# Patient Record
Sex: Female | Born: 1970 | Race: White | Hispanic: No | Marital: Married | Smoking: Current every day smoker
Health system: Southern US, Community
[De-identification: ages and names within clinical notes are randomized; demographics above are authoritative.]

## PROBLEM LIST (undated history)

## (undated) DIAGNOSIS — K635 Polyp of colon: Secondary | ICD-10-CM

## (undated) DIAGNOSIS — N926 Irregular menstruation, unspecified: Secondary | ICD-10-CM

## (undated) DIAGNOSIS — Z8709 Personal history of other diseases of the respiratory system: Secondary | ICD-10-CM

## (undated) DIAGNOSIS — Z87442 Personal history of urinary calculi: Secondary | ICD-10-CM

## (undated) DIAGNOSIS — R7303 Prediabetes: Secondary | ICD-10-CM

## (undated) DIAGNOSIS — R0609 Other forms of dyspnea: Secondary | ICD-10-CM

## (undated) DIAGNOSIS — K589 Irritable bowel syndrome without diarrhea: Secondary | ICD-10-CM

## (undated) DIAGNOSIS — Z915 Personal history of self-harm: Secondary | ICD-10-CM

## (undated) DIAGNOSIS — N200 Calculus of kidney: Secondary | ICD-10-CM

## (undated) DIAGNOSIS — F32A Depression, unspecified: Secondary | ICD-10-CM

## (undated) DIAGNOSIS — F411 Generalized anxiety disorder: Secondary | ICD-10-CM

## (undated) DIAGNOSIS — Z9151 Personal history of suicidal behavior: Secondary | ICD-10-CM

## (undated) DIAGNOSIS — F319 Bipolar disorder, unspecified: Secondary | ICD-10-CM

## (undated) DIAGNOSIS — J309 Allergic rhinitis, unspecified: Secondary | ICD-10-CM

## (undated) DIAGNOSIS — Z87898 Personal history of other specified conditions: Secondary | ICD-10-CM

## (undated) DIAGNOSIS — K222 Esophageal obstruction: Secondary | ICD-10-CM

## (undated) DIAGNOSIS — R0600 Dyspnea, unspecified: Secondary | ICD-10-CM

## (undated) DIAGNOSIS — F329 Major depressive disorder, single episode, unspecified: Secondary | ICD-10-CM

## (undated) HISTORY — DX: Polyp of colon: K63.5

## (undated) HISTORY — PX: COLONOSCOPY: SHX174

## (undated) HISTORY — PX: EXTRACORPOREAL SHOCK WAVE LITHOTRIPSY: SHX1557

---

## 1997-12-29 HISTORY — PX: TUBAL LIGATION: SHX77

## 1998-03-11 ENCOUNTER — Inpatient Hospital Stay (HOSPITAL_COMMUNITY): Admission: AD | Admit: 1998-03-11 | Discharge: 1998-03-11 | Payer: Self-pay | Admitting: *Deleted

## 1998-04-18 ENCOUNTER — Ambulatory Visit (HOSPITAL_COMMUNITY): Admission: RE | Admit: 1998-04-18 | Discharge: 1998-04-18 | Payer: Self-pay | Admitting: *Deleted

## 1998-04-26 ENCOUNTER — Inpatient Hospital Stay (HOSPITAL_COMMUNITY): Admission: AD | Admit: 1998-04-26 | Discharge: 1998-04-26 | Payer: Self-pay | Admitting: Obstetrics & Gynecology

## 1998-04-27 ENCOUNTER — Inpatient Hospital Stay (HOSPITAL_COMMUNITY): Admission: AD | Admit: 1998-04-27 | Discharge: 1998-04-27 | Payer: Self-pay | Admitting: Obstetrics & Gynecology

## 1998-05-10 ENCOUNTER — Ambulatory Visit (HOSPITAL_COMMUNITY): Admission: RE | Admit: 1998-05-10 | Discharge: 1998-05-10 | Payer: Self-pay | Admitting: *Deleted

## 1998-06-25 ENCOUNTER — Inpatient Hospital Stay (HOSPITAL_COMMUNITY): Admission: AD | Admit: 1998-06-25 | Discharge: 1998-06-25 | Payer: Self-pay | Admitting: Obstetrics & Gynecology

## 1998-06-29 ENCOUNTER — Inpatient Hospital Stay (HOSPITAL_COMMUNITY): Admission: AD | Admit: 1998-06-29 | Discharge: 1998-06-29 | Payer: Self-pay | Admitting: Obstetrics & Gynecology

## 1998-07-09 ENCOUNTER — Ambulatory Visit (HOSPITAL_COMMUNITY): Admission: RE | Admit: 1998-07-09 | Discharge: 1998-07-09 | Payer: Self-pay | Admitting: *Deleted

## 1998-08-28 ENCOUNTER — Inpatient Hospital Stay (HOSPITAL_COMMUNITY): Admission: AD | Admit: 1998-08-28 | Discharge: 1998-08-28 | Payer: Self-pay | Admitting: *Deleted

## 1998-08-29 ENCOUNTER — Inpatient Hospital Stay (HOSPITAL_COMMUNITY): Admission: AD | Admit: 1998-08-29 | Discharge: 1998-08-29 | Payer: Self-pay | Admitting: Obstetrics

## 1998-09-20 ENCOUNTER — Inpatient Hospital Stay (HOSPITAL_COMMUNITY): Admission: AD | Admit: 1998-09-20 | Discharge: 1998-09-20 | Payer: Self-pay | Admitting: Obstetrics & Gynecology

## 1998-10-08 ENCOUNTER — Observation Stay (HOSPITAL_COMMUNITY): Admission: AD | Admit: 1998-10-08 | Discharge: 1998-10-09 | Payer: Self-pay | Admitting: Obstetrics

## 1998-10-12 ENCOUNTER — Inpatient Hospital Stay (HOSPITAL_COMMUNITY): Admission: AD | Admit: 1998-10-12 | Discharge: 1998-10-12 | Payer: Self-pay | Admitting: Obstetrics & Gynecology

## 1998-10-18 ENCOUNTER — Inpatient Hospital Stay (HOSPITAL_COMMUNITY): Admission: AD | Admit: 1998-10-18 | Discharge: 1998-10-18 | Payer: Self-pay | Admitting: Obstetrics & Gynecology

## 1998-10-20 ENCOUNTER — Inpatient Hospital Stay (HOSPITAL_COMMUNITY): Admission: AD | Admit: 1998-10-20 | Discharge: 1998-10-23 | Payer: Self-pay | Admitting: Obstetrics & Gynecology

## 1999-03-21 ENCOUNTER — Emergency Department (HOSPITAL_COMMUNITY): Admission: EM | Admit: 1999-03-21 | Discharge: 1999-03-21 | Payer: Self-pay | Admitting: Emergency Medicine

## 2000-09-30 ENCOUNTER — Emergency Department (HOSPITAL_COMMUNITY): Admission: EM | Admit: 2000-09-30 | Discharge: 2000-10-01 | Payer: Self-pay | Admitting: Emergency Medicine

## 2004-08-09 ENCOUNTER — Emergency Department (HOSPITAL_COMMUNITY): Admission: EM | Admit: 2004-08-09 | Discharge: 2004-08-09 | Payer: Self-pay | Admitting: Family Medicine

## 2004-12-25 ENCOUNTER — Emergency Department (HOSPITAL_COMMUNITY): Admission: EM | Admit: 2004-12-25 | Discharge: 2004-12-25 | Payer: Self-pay | Admitting: Emergency Medicine

## 2005-09-18 ENCOUNTER — Ambulatory Visit (HOSPITAL_COMMUNITY): Admission: RE | Admit: 2005-09-18 | Discharge: 2005-09-19 | Payer: Self-pay | Admitting: Urology

## 2005-09-18 HISTORY — PX: PERCUTANEOUS NEPHROSTOLITHOTOMY: SHX2207

## 2005-10-09 ENCOUNTER — Ambulatory Visit (HOSPITAL_COMMUNITY): Admission: RE | Admit: 2005-10-09 | Discharge: 2005-10-09 | Payer: Self-pay | Admitting: Urology

## 2007-07-19 ENCOUNTER — Emergency Department (HOSPITAL_COMMUNITY): Admission: EM | Admit: 2007-07-19 | Discharge: 2007-07-19 | Payer: Self-pay | Admitting: Emergency Medicine

## 2008-03-06 ENCOUNTER — Emergency Department (HOSPITAL_COMMUNITY): Admission: EM | Admit: 2008-03-06 | Discharge: 2008-03-06 | Payer: Self-pay | Admitting: Emergency Medicine

## 2008-04-10 ENCOUNTER — Emergency Department (HOSPITAL_COMMUNITY): Admission: EM | Admit: 2008-04-10 | Discharge: 2008-04-10 | Payer: Self-pay | Admitting: Emergency Medicine

## 2008-06-15 ENCOUNTER — Emergency Department (HOSPITAL_COMMUNITY): Admission: EM | Admit: 2008-06-15 | Discharge: 2008-06-15 | Payer: Self-pay | Admitting: Emergency Medicine

## 2008-07-07 ENCOUNTER — Emergency Department (HOSPITAL_COMMUNITY): Admission: EM | Admit: 2008-07-07 | Discharge: 2008-07-08 | Payer: Self-pay | Admitting: Family Medicine

## 2008-08-28 ENCOUNTER — Encounter: Admission: RE | Admit: 2008-08-28 | Discharge: 2008-11-26 | Payer: Self-pay | Admitting: Chiropractic Medicine

## 2008-08-31 ENCOUNTER — Ambulatory Visit (HOSPITAL_COMMUNITY): Admission: RE | Admit: 2008-08-31 | Discharge: 2008-08-31 | Payer: Self-pay | Admitting: Urology

## 2008-11-22 ENCOUNTER — Encounter: Admission: RE | Admit: 2008-11-22 | Discharge: 2008-11-22 | Payer: Self-pay | Admitting: Sports Medicine

## 2008-12-26 ENCOUNTER — Encounter: Admission: RE | Admit: 2008-12-26 | Discharge: 2008-12-26 | Payer: Self-pay | Admitting: Sports Medicine

## 2011-01-19 ENCOUNTER — Encounter: Payer: Self-pay | Admitting: Nephrology

## 2011-01-19 ENCOUNTER — Encounter: Payer: Self-pay | Admitting: Sports Medicine

## 2011-05-16 NOTE — Op Note (Signed)
NAME:  Stacie Sanders, Stacie Sanders                 ACCOUNT NO.:  [id number]   MEDICAL RECORD NO.:  [id number]          PATIENT TYPE:  AMB   LOCATION:  DAY                          FACILITY:  [facility]   PHYSICIAN:  Mark C. Vernie Ammons, M.D.  DATE OF BIRTH:  [birth year]   DATE OF PROCEDURE:  09/18/2005  DATE OF DISCHARGE:                                 OPERATIVE REPORT   PREOPERATIVE DIAGNOSIS:  Right staghorn calculus.   POSTOPERATIVE DIAGNOSIS:  Right staghorn calculus.   PROCEDURE:  Right percutaneous nephrostolithotomy (5 cm.) with antegrade  pyelogram with interpretation.   SURGEON:  Dr. Vernie Ammons.   ASSISTANT:  Dr. Blanch Media.   RADIOLOGIST:  Dr. Miles Costain.   DRAIN:  A 22-French Council-tip catheter in the renal pelvis as a  nephrostomy tube.   SPECIMEN:  Stone given to patient.   BLOOD LOSS:  Approximately 500 mL.   COMPLICATIONS:  None.   INDICATIONS:  The patient is a 40 year old white female, who had right flank  pain and was evaluated with a CT scan that revealed a large staghorn  calculus involving the lower third of her right kidney.  There was no  obstruction, but it was causing pain.  We discussed the treatment options.  She has elected to proceed with percutaneous nephrostolithotomy,  understanding the risks, complications, and alternatives.   DESCRIPTION OF OPERATION:  After informed consent, the patient brought to  the major OR, placed on the table, administered general anesthesia, then  moved to the prone position.  Her flank was sterilely prepped and draped,  and the nephrostomy catheter that was placed by radiology earlier in the day  was prepped in the field.  Through this, Dr. Miles Costain placed a working  guidewire, a safety guidewire, and then the fascial dilating balloon over  which a 30-French Amplatz sheath was then passed into the area of the renal  pelvis.  I then passed the rigid nephroscope through the Amplatz sheath into  the lower pole calix and identified the stone.  Using  the LithoClast, the  stone was fragmented, and large fragments were removed.  I then was able to  grasp further portions of the stone that extended up superiorly.  The stone  was fragmented with LithoClast, and then the large portions were extracted.  There was a moderate amount of bleeding during the procedure which made it  somewhat difficult to visualize the stone.  There was one portion of the  stone that remained at the end of the procedure, and I could not visualize  it.  In reviewing her CT scan in the operating room, it appeared that this  stone was in a parallel calix.  Because it could not be accessed with the  rigid scope and because of the amount of blood in the renal pelvis  preventing flexible cystoscopy or flexible nephrostomy, it was felt the best  treatment was to place a nephrostomy tube and treat this stone with either  lithotripsy or relook nephroscopy at a later date.  The tube was secured  after an antegrade nephrostogram revealed no significant extravasation and  passage  of contrast down the ureter.  The balloon was filled with  approximately 3 mL of dilute contrast, and the tube was secured to the skin  with silk sutures.  A sterile occlusive dressing was applied using 4x4s and  Tegaderm, and the tube was connected to closed system drainage.  The patient  was awakened and taken to recovery room in stable, satisfactory condition.  She tolerated the procedure well with no intraoperative complications.  She  will be observed overnight with anticipated discharge in the morning.      Mark C. Vernie Ammons, M.D.  Electronically Signed     MCO/MEDQ  D:  09/18/2005  T:  09/19/2005  Job:  [id number]

## 2011-09-22 LAB — DIFFERENTIAL
Basophils Absolute: 0.1
Basophils Relative: 0
Eosinophils Absolute: 0
Eosinophils Relative: 0
Lymphocytes Relative: 12
Lymphs Abs: 1.4
Monocytes Absolute: 0.7
Monocytes Relative: 6
Neutro Abs: 9.4 — ABNORMAL HIGH
Neutrophils Relative %: 81 — ABNORMAL HIGH

## 2011-09-22 LAB — I-STAT 8, (EC8 V) (CONVERTED LAB)
Acid-base deficit: 1
BUN: 5 — ABNORMAL LOW
Bicarbonate: 23.2
Chloride: 100
Glucose, Bld: 105 — ABNORMAL HIGH
HCT: 44
Hemoglobin: 15
Operator id: 151321
Potassium: 3.8
Sodium: 132 — ABNORMAL LOW
TCO2: 24
pCO2, Ven: 37 — ABNORMAL LOW
pH, Ven: 7.406 — ABNORMAL HIGH

## 2011-09-22 LAB — URINE MICROSCOPIC-ADD ON

## 2011-09-22 LAB — URINALYSIS, ROUTINE W REFLEX MICROSCOPIC
Glucose, UA: NEGATIVE
Ketones, ur: >80 — AB
Nitrite: NEGATIVE
Protein, ur: 100 — AB
Specific Gravity, Urine: 1.021
Urobilinogen, UA: 1
pH: 6

## 2011-09-22 LAB — CBC
HCT: 38.9
Hemoglobin: 13.4
MCHC: 34.4
MCV: 90.6
Platelets: 294
RBC: 4.3
RDW: 12.1
WBC: 11.7 — ABNORMAL HIGH

## 2011-09-22 LAB — POCT I-STAT CREATININE
Creatinine, Ser: 1
Operator id: 151321

## 2011-09-25 LAB — BASIC METABOLIC PANEL
BUN: 6
CO2: 22
Calcium: 9.2
Chloride: 102
Creatinine, Ser: 0.8
GFR calc Af Amer: >60
GFR calc non Af Amer: >60
Glucose, Bld: 135 — ABNORMAL HIGH
Potassium: 3.6
Sodium: 134 — ABNORMAL LOW

## 2011-09-25 LAB — DIFFERENTIAL
Basophils Absolute: 0
Basophils Absolute: 0.1
Basophils Relative: 0
Eosinophils Absolute: 0
Eosinophils Relative: 0
Eosinophils Relative: 1
Lymphocytes Relative: 7 — ABNORMAL LOW
Lymphocytes Relative: 18
Lymphs Abs: 1.3
Monocytes Absolute: 0.8
Monocytes Relative: 4
Neutro Abs: 18.3 — ABNORMAL HIGH
Neutrophils Relative %: 90 — ABNORMAL HIGH

## 2011-09-25 LAB — CBC
HCT: 42.4
Hemoglobin: 14.6
MCHC: 34.3
MCHC: 34.3
MCV: 90.4
Platelets: 304
Platelets: 357
RBC: 4.69
RDW: 12.8
RDW: 12.7
WBC: 20.4 — ABNORMAL HIGH

## 2011-09-25 LAB — URINE MICROSCOPIC-ADD ON

## 2011-09-25 LAB — URINALYSIS, ROUTINE W REFLEX MICROSCOPIC
Bilirubin Urine: NEGATIVE
Glucose, UA: NEGATIVE
Hgb urine dipstick: NEGATIVE
Ketones, ur: NEGATIVE
Leukocytes, UA: NEGATIVE
Nitrite: NEGATIVE
Protein, ur: 30 — AB
Specific Gravity, Urine: 1.018
Urobilinogen, UA: 0.2
pH: 6.5

## 2011-09-25 LAB — COMPREHENSIVE METABOLIC PANEL
ALT: 27
Albumin: 3.7
Alkaline Phosphatase: 103
Calcium: 9.2
GFR calc Af Amer: >60
GFR calc non Af Amer: >60
Potassium: 3.3 — ABNORMAL LOW
Sodium: 138
Total Protein: 7

## 2011-09-25 LAB — RAPID STREP SCREEN (MED CTR MEBANE ONLY): Streptococcus, Group A Screen (Direct): POSITIVE — AB

## 2011-09-25 LAB — LIPASE, BLOOD: Lipase: 17

## 2013-08-16 ENCOUNTER — Emergency Department (HOSPITAL_COMMUNITY)
Admission: EM | Admit: 2013-08-16 | Discharge: 2013-08-16 | Disposition: A | Discharge status: Home / Self Care | Payer: Self-pay | Attending: Emergency Medicine | Admitting: Emergency Medicine

## 2013-08-16 ENCOUNTER — Encounter (HOSPITAL_COMMUNITY): Payer: Self-pay | Admitting: *Deleted

## 2013-08-16 ENCOUNTER — Emergency Department (HOSPITAL_COMMUNITY): Payer: Self-pay

## 2013-08-16 DIAGNOSIS — Z8659 Personal history of other mental and behavioral disorders: Secondary | ICD-10-CM | POA: Insufficient documentation

## 2013-08-16 DIAGNOSIS — M545 Low back pain, unspecified: Secondary | ICD-10-CM

## 2013-08-16 DIAGNOSIS — IMO0002 Reserved for concepts with insufficient information to code with codable children: Secondary | ICD-10-CM | POA: Insufficient documentation

## 2013-08-16 DIAGNOSIS — Y9289 Other specified places as the place of occurrence of the external cause: Secondary | ICD-10-CM | POA: Insufficient documentation

## 2013-08-16 DIAGNOSIS — X500XXA Overexertion from strenuous movement or load, initial encounter: Secondary | ICD-10-CM | POA: Insufficient documentation

## 2013-08-16 DIAGNOSIS — F17200 Nicotine dependence, unspecified, uncomplicated: Secondary | ICD-10-CM | POA: Insufficient documentation

## 2013-08-16 DIAGNOSIS — Z87442 Personal history of urinary calculi: Secondary | ICD-10-CM | POA: Insufficient documentation

## 2013-08-16 DIAGNOSIS — Y93E9 Activity, other interior property and clothing maintenance: Secondary | ICD-10-CM | POA: Insufficient documentation

## 2013-08-16 HISTORY — DX: Depression, unspecified: F32.A

## 2013-08-16 HISTORY — DX: Major depressive disorder, single episode, unspecified: F32.9

## 2013-08-16 MED ORDER — NAPROXEN 500 MG PO TABS: 500.0000 mg | ORAL_TABLET | Freq: Two times a day (BID) | ORAL | Status: DC

## 2013-08-16 MED ORDER — METHOCARBAMOL 500 MG PO TABS: 500.0000 mg | ORAL_TABLET | Freq: Two times a day (BID) | ORAL | Status: DC

## 2013-08-16 MED ORDER — HYDROCODONE-ACETAMINOPHEN 5-325 MG PO TABS: 1.0000 | ORAL_TABLET | Freq: Four times a day (QID) | ORAL | Status: DC | PRN

## 2013-08-16 NOTE — ED Notes (Signed)
Pt states Saturday felt back pop and since has been having pain from mid back down to hips.  No strenuous activity.  Pt feels like she has spasms

## 2013-08-16 NOTE — ED Provider Notes (Signed)
CSN: [id number]     Arrival date & time 08/16/13  9147 History     First MD Initiated Contact with Patient 08/16/13 0957     Chief Complaint  Patient presents with  . Back Pain   (Consider location/radiation/quality/duration/timing/severity/associated sxs/prior Treatment) HPI Comments: Patient presents with a chief complaint of lower back pain.  She reports that she when she was bending over and cleaning three days ago she felt a "pop" in her lower back.  She has been having constant pain since that time.  She has taken Ibuprofen for the pain without relief.  She states that the pain is worse on the right side of her lower back.  Pain radiates to her right buttock.  She denies numbness, tingling, bowel or bladder incontinence, fever or chills.    Patient is a 42 y.o. female presenting with back pain. The history is provided by the patient.  Back Pain Worsened by:  Bending and ambulation Associated symptoms: no abdominal pain, no bladder incontinence, no bowel incontinence, no dysuria, no fever, no numbness, no paresthesias, no tingling and no weakness     Past Medical History  Diagnosis Date  . Depression   . Kidney stone    Past Surgical History  Procedure Laterality Date  . Tubal ligation    . Lithotripsy     No family history on file. History  Substance Use Topics  . Smoking status: Current Every Day Smoker  . Smokeless tobacco: Not on file  . Alcohol Use: No   OB History   Grav Para Term Preterm Abortions TAB SAB Ect Mult Living                 Review of Systems  Constitutional: Negative for fever.  Gastrointestinal: Negative for abdominal pain and bowel incontinence.  Genitourinary: Negative for bladder incontinence and dysuria.  Musculoskeletal: Positive for back pain.  Neurological: Negative for tingling, weakness, numbness and paresthesias.  All other systems reviewed and are negative.    Allergies  Review of patient's allergies indicates no known  allergies.  Home Medications   Current Outpatient Rx  Name  Route  Sig  Dispense  Refill  . ibuprofen (ADVIL,MOTRIN) 200 MG tablet   Oral   Take 600 mg by mouth every 6 (six) hours as needed for pain.          BP 116/70  Pulse 80  Temp(Src) 98.5 F (36.9 C) (Oral)  Resp 18  SpO2 98%  LMP 07/07/2013 Physical Exam  Nursing note and vitals reviewed. Constitutional: She appears well-developed and well-nourished.  HENT:  Head: Normocephalic and atraumatic.  Cardiovascular: Normal rate, regular rhythm and normal heart sounds.   Pulmonary/Chest: Effort normal and breath sounds normal.  Musculoskeletal:  Pain improved with flexion of the back.  Pain worsens with extension of the back.  Neurological: She is alert. She has normal strength. No sensory deficit. Gait normal.  Reflex Scores:      Patellar reflexes are 2+ on the right side and 2+ on the left side.      Achilles reflexes are 2+ on the right side and 2+ on the left side. Skin: Skin is warm and dry.  Psychiatric: She has a normal mood and affect.    ED Course   Procedures (including critical care time)  Labs Reviewed - No data to display No results found. No diagnosis found.  MDM  Patient with back pain.  No neurological deficits and normal neuro exam.  Patient can walk  but states is painful.  No loss of bowel or bladder control.  No concern for cauda equina.  No fever, night sweats, weight loss, h/o cancer, IVDU.  RICE protocol and pain medicine indicated and discussed with patient.   Pascal Lux Green, PA-C 08/16/13 [phone]

## 2013-08-16 NOTE — ED Provider Notes (Signed)
Medical screening examination/treatment/procedure(s) were performed by non-physician practitioner and as supervising physician I was immediately available for consultation/collaboration.  Clovis Warwick L Keyshun Elpers, MD 08/16/13 1633 

## 2014-08-28 ENCOUNTER — Encounter (HOSPITAL_COMMUNITY): Payer: Self-pay | Admitting: Emergency Medicine

## 2014-08-28 ENCOUNTER — Emergency Department (INDEPENDENT_AMBULATORY_CARE_PROVIDER_SITE_OTHER)
Admission: EM | Admit: 2014-08-28 | Discharge: 2014-08-28 | Disposition: A | Discharge status: Home / Self Care | Payer: Self-pay | Source: Home / Self Care

## 2014-08-28 DIAGNOSIS — J4520 Mild intermittent asthma, uncomplicated: Secondary | ICD-10-CM

## 2014-08-28 DIAGNOSIS — Z720 Tobacco use: Secondary | ICD-10-CM

## 2014-08-28 DIAGNOSIS — J45909 Unspecified asthma, uncomplicated: Secondary | ICD-10-CM

## 2014-08-28 DIAGNOSIS — J3089 Other allergic rhinitis: Secondary | ICD-10-CM

## 2014-08-28 DIAGNOSIS — F17200 Nicotine dependence, unspecified, uncomplicated: Secondary | ICD-10-CM

## 2014-08-28 MED ORDER — METHYLPREDNISOLONE 4 MG PO KIT: PACK | ORAL | Status: DC

## 2014-08-28 MED ORDER — ALBUTEROL SULFATE (2.5 MG/3ML) 0.083% IN NEBU
2.5000 mg | INHALATION_SOLUTION | Freq: Once | RESPIRATORY_TRACT | Status: AC
  Administered 2014-08-28: 2.5 mg via RESPIRATORY_TRACT

## 2014-08-28 MED ORDER — IPRATROPIUM-ALBUTEROL 0.5-2.5 (3) MG/3ML IN SOLN
3.0000 mL | Freq: Once | RESPIRATORY_TRACT | Status: AC
  Administered 2014-08-28: 3 mL via RESPIRATORY_TRACT

## 2014-08-28 MED ORDER — ALBUTEROL SULFATE HFA 108 (90 BASE) MCG/ACT IN AERS: 2.0000 | INHALATION_SPRAY | RESPIRATORY_TRACT | Status: DC | PRN

## 2014-08-28 MED ORDER — ALBUTEROL SULFATE (2.5 MG/3ML) 0.083% IN NEBU
INHALATION_SOLUTION | RESPIRATORY_TRACT | Status: AC
  Filled 2014-08-28: qty 6

## 2014-08-28 MED ORDER — IPRATROPIUM-ALBUTEROL 0.5-2.5 (3) MG/3ML IN SOLN
RESPIRATORY_TRACT | Status: AC
Start: 2014-08-28 — End: 2014-08-28
  Filled 2014-08-28: qty 3

## 2014-08-28 NOTE — ED Notes (Signed)
Patient c/o cough onset 2 days ago. Patient reports she feels the most pain after she coughs. Patient also has SOB after coughing. Patient is alert and oriented and in no acute distress.

## 2014-08-28 NOTE — ED Provider Notes (Signed)
CSN: [id number]     Arrival date & time 08/28/14  1214 History   First MD Initiated Contact with Patient 08/28/14 1304     Chief Complaint  Patient presents with  . Cough   (Consider location/radiation/quality/duration/timing/severity/associated sxs/prior Treatment) HPI Comments: 43 year old obese female smoker and is complaining of cough for 2 days. She is also having a chest pain associated with the cough. She complains of shortness of breath when she coughs. Denies PND or fever   Past Medical History  Diagnosis Date  . Depression   . Kidney stone    Past Surgical History  Procedure Laterality Date  . Tubal ligation    . Lithotripsy     No family history on file. History  Substance Use Topics  . Smoking status: Current Every Day Smoker  . Smokeless tobacco: Not on file  . Alcohol Use: No   OB History   Grav Para Term Preterm Abortions TAB SAB Ect Mult Living                 Review of Systems  Constitutional: Negative for fever, chills, activity change, appetite change and fatigue.  HENT: Positive for congestion and rhinorrhea. Negative for facial swelling, postnasal drip and sore throat.   Eyes: Negative.   Respiratory: Positive for cough and shortness of breath.   Cardiovascular: Negative.   Musculoskeletal: Negative for neck pain and neck stiffness.  Skin: Negative for pallor and rash.  Neurological: Negative.     Allergies  Review of patient's allergies indicates no known allergies.  Home Medications   Prior to Admission medications   Medication Sig Start Date End Date Taking? Authorizing Provider  albuterol (PROVENTIL HFA;VENTOLIN HFA) 108 (90 BASE) MCG/ACT inhaler Inhale 2 puffs into the lungs every 4 (four) hours as needed for wheezing or shortness of breath. 08/28/14   Hayden Rasmussen, NP  HYDROcodone-acetaminophen (NORCO/VICODIN) 5-325 MG per tablet Take 1-2 tablets by mouth every 6 (six) hours as needed for pain. 08/16/13   Heather Laisure, PA-C  ibuprofen  (ADVIL,MOTRIN) 200 MG tablet Take 600 mg by mouth every 6 (six) hours as needed for pain.    Historical Provider, MD  methocarbamol (ROBAXIN) 500 MG tablet Take 1 tablet (500 mg total) by mouth 2 (two) times daily. 08/16/13   Heather Laisure, PA-C  methylPREDNISolone (MEDROL DOSEPAK) 4 MG tablet As directed. Take with  food 08/28/14   Hayden Rasmussen, NP  naproxen (NAPROSYN) 500 MG tablet Take 1 tablet (500 mg total) by mouth 2 (two) times daily. 08/16/13   Heather Laisure, PA-C   BP 122/89  Pulse 88  Temp(Src) 98.2 F (36.8 C) (Oral)  Resp 18  SpO2 100%  LMP 08/13/2014 Physical Exam  Nursing note and vitals reviewed. Constitutional: She is oriented to person, place, and time. She appears well-developed and well-nourished. No distress.  HENT:  Mouth/Throat: No oropharyngeal exudate.  OP with minor injection and clear PND. Mucous membranes moist.  Eyes: Conjunctivae and EOM are normal.  Neck: Normal range of motion. Neck supple.  Cardiovascular: Normal rate, regular rhythm and normal heart sounds.   Pulmonary/Chest: Effort normal. No respiratory distress. She has wheezes.  Expiration reveals diffuse bilateral coarseness and wheezing.  Musculoskeletal: Normal range of motion. She exhibits no edema.  Lymphadenopathy:    She has no cervical adenopathy.  Neurological: She is alert and oriented to person, place, and time.  Skin: Skin is warm and dry. No rash noted.  Psychiatric: She has a normal mood and affect.  ED Course  Procedures (including critical care time) Labs Review Labs Reviewed - No data to display  Imaging Review No results found.   MDM   1. RAD (reactive airway disease) with wheezing, mild intermittent, uncomplicated   2. Other allergic rhinitis   3. Tobacco abuse disorder    Post duoneb improved air movement, still with some coarseness with cough. When chk'd while using the neb each time she did not have the mouthpiece in her mouth. Query compliant. Albuterol  HFA claritin or allegra for drainage Medrol dose pack Stop Smoking    Hayden Rasmussen, NP 08/28/14 1420

## 2014-08-28 NOTE — ED Provider Notes (Signed)
Medical screening examination/treatment/procedure(s) were performed by non-physician practitioner and as supervising physician I was immediately available for consultation/collaboration.  Leslee Home, M.D.  Reuben Likes, MD 08/28/14 [phone]

## 2014-08-28 NOTE — Discharge Instructions (Signed)
Bronchospasm °A bronchospasm is a spasm or tightening of the airways going into the lungs. During a bronchospasm breathing becomes more difficult because the airways get smaller. When this happens there can be coughing, a whistling sound when breathing (wheezing), and difficulty breathing. Bronchospasm is often associated with asthma, but not all patients who experience a bronchospasm have asthma. °CAUSES  °A bronchospasm is caused by inflammation or irritation of the airways. The inflammation or irritation may be triggered by:  °· Allergies (such as to animals, pollen, food, or mold). Allergens that cause bronchospasm may cause wheezing immediately after exposure or many hours later.   °· Infection. Viral infections are believed to be the most common cause of bronchospasm.   °· Exercise.   °· Irritants (such as pollution, cigarette smoke, strong odors, aerosol sprays, and paint fumes).   °· Weather changes. Winds increase molds and pollens in the air. Rain refreshes the air by washing irritants out. Cold air may cause inflammation.   °· Stress and emotional upset.   °SIGNS AND SYMPTOMS  °· Wheezing.   °· Excessive nighttime coughing.   °· Frequent or severe coughing with a simple cold.   °· Chest tightness.   °· Shortness of breath.   °DIAGNOSIS  °Bronchospasm is usually diagnosed through a history and physical exam. Tests, such as chest X-rays, are sometimes done to look for other conditions. °TREATMENT  °· Inhaled medicines can be given to open up your airways and help you breathe. The medicines can be given using either an inhaler or a nebulizer machine. °· Corticosteroid medicines may be given for severe bronchospasm, usually when it is associated with asthma. °HOME CARE INSTRUCTIONS  °· Always have a plan prepared for seeking medical care. Know when to call your health care provider and local emergency services (911 in the [place] Know where you can access local emergency care. °· Only take medicines as  directed by your health care provider. °· If you were prescribed an inhaler or nebulizer machine, ask your health care provider to explain how to use it correctly. Always use a spacer with your inhaler if you were given one. °· It is necessary to remain calm during an attack. Try to relax and breathe more slowly.  °· Control your home environment in the following ways:   °¨ Change your heating and air conditioning filter at least once a month.   °¨ Limit your use of fireplaces and wood stoves. °¨ Do not smoke and do not allow smoking in your home.   °¨ Avoid exposure to perfumes and fragrances.   °¨ Get rid of pests (such as roaches and mice) and their droppings.   °¨ Throw away plants if you see mold on them.   °¨ Keep your house clean and dust free.   °¨ Replace carpet with wood, tile, or vinyl flooring. Carpet can trap dander and dust.   °¨ Use allergy-proof pillows, mattress covers, and box spring covers.   °¨ Wash bed sheets and blankets every week in hot water and dry them in a dryer.   °¨ Use blankets that are made of polyester or cotton.   °¨ Wash hands frequently. °SEEK MEDICAL CARE IF:  °· You have muscle aches.   °· You have chest pain.   °· The sputum changes from clear or white to yellow, green, gray, or bloody.   °· The sputum you cough up gets thicker.   °· There are problems that may be related to the medicine you are given, such as a rash, itching, swelling, or trouble breathing.   °SEEK IMMEDIATE MEDICAL CARE IF:  °· You have worsening wheezing and coughing even   after taking your prescribed medicines.   You have increased difficulty breathing.   You develop severe chest pain. MAKE SURE YOU:   Understand these instructions.  Will watch your condition.  Will get help right away if you are not doing well or get worse. Document Released: 12/18/2003 Document Revised: 12/20/2013 Document Reviewed: 06/06/2013 [facility] Patient Information 2015 [place], [place]. This information is not  intended to replace advice given to you by your health care provider. Make sure you discuss any questions you have with your health care provider.  Cough, Adult  A cough is a reflex. It helps you clear your throat and airways. A cough can help heal your body. A cough can last 2 or 3 weeks (acute) or may last more than 8 weeks (chronic). Some common causes of a cough can include an infection, allergy, or a cold. HOME CARE  Only take medicine as told by your doctor.  If given, take your medicines (antibiotics) as told. Finish them even if you start to feel better.  Use a cold steam vaporizer or humidifier in your home. This can help loosen thick spit (secretions).  Sleep so you are almost sitting up (semi-upright). Use pillows to do this. This helps reduce coughing.  Rest as needed.  Stop smoking if you smoke. GET HELP RIGHT AWAY IF:  You have yellowish-white fluid (pus) in your thick spit.  Your cough gets worse.  Your medicine does not reduce coughing, and you are losing sleep.  You cough up blood.  You have trouble breathing.  Your pain gets worse and medicine does not help.  You have a fever. MAKE SURE YOU:   Understand these instructions.  Will watch your condition.  Will get help right away if you are not doing well or get worse. Document Released: 08/28/2011 Document Revised: 05/01/2014 Document Reviewed: 08/28/2011 [facility] Patient Information 2015 [place], [place]. This information is not intended to replace advice given to you by your health care provider. Make sure you discuss any questions you have with your health care provider.  How to Use an Inhaler Using your inhaler correctly is very important. Good technique will make sure that the medicine reaches your lungs.  HOW TO USE AN INHALER: 1. Take the cap off the inhaler. 2. If this is the first time using your inhaler, you need to prime it. Shake the inhaler for 5 seconds. Release four puffs into the air, away  from your face. Ask your doctor for help if you have questions. 3. Shake the inhaler for 5 seconds. 4. Turn the inhaler so the bottle is above the mouthpiece. 5. Put your pointer finger on top of the bottle. Your thumb holds the bottom of the inhaler. 6. Open your mouth. 7. Either hold the inhaler away from your mouth (the width of 2 fingers) or place your lips tightly around the mouthpiece. Ask your doctor which way to use your inhaler. 8. Breathe out as much air as possible. 9. Breathe in and push down on the bottle 1 time to release the medicine. You will feel the medicine go in your mouth and throat. 10. Continue to take a deep breath in very slowly. Try to fill your lungs. 11. After you have breathed in completely, hold your breath for 10 seconds. This will help the medicine to settle in your lungs. If you cannot hold your breath for 10 seconds, hold it for as long as you can before you breathe out. 12. Breathe out slowly, through pursed lips.  Whistling is an example of pursed lips. 13. If your doctor has told you to take more than 1 puff, wait at least 15-30 seconds between puffs. This will help you get the best results from your medicine. Do not use the inhaler more than your doctor tells you to. 14. Put the cap back on the inhaler. 15. Follow the directions from your doctor or from the inhaler package about cleaning the inhaler. If you use more than one inhaler, ask your doctor which inhalers to use and what order to use them in. Ask your doctor to help you figure out when you will need to refill your inhaler.  If you use a steroid inhaler, always rinse your mouth with water after your last puff, gargle and spit out the water. Do not swallow the water. GET HELP IF:  The inhaler medicine only partially helps to stop wheezing or shortness of breath.  You are having trouble using your inhaler.  You have some increase in thick spit (phlegm). GET HELP RIGHT AWAY IF:  The inhaler medicine  does not help your wheezing or shortness of breath or you have tightness in your chest.  You have dizziness, headaches, or fast heart rate.  You have chills, fever, or night sweats.  You have a large increase of thick spit, or your thick spit is bloody. MAKE SURE YOU:   Understand these instructions.  Will watch your condition.  Will get help right away if you are not doing well or get worse. Document Released: 09/23/2008 Document Revised: 10/05/2013 Document Reviewed: 07/14/2013 [facility] Patient Information 2015 [place], [place]. This information is not intended to replace advice given to you by your health care provider. Make sure you discuss any questions you have with your health care provider.  Smoking Cessation Quitting smoking is important to your health and has many advantages. However, it is not always easy to quit since nicotine is a very addictive drug. Oftentimes, people try 3 times or more before being able to quit. This document explains the best ways for you to prepare to quit smoking. Quitting takes hard work and a lot of effort, but you can do it. ADVANTAGES OF QUITTING SMOKING  You will live longer, feel better, and live better.  Your body will feel the impact of quitting smoking almost immediately.  Within 20 minutes, blood pressure decreases. Your pulse returns to its normal level.  After 8 hours, carbon monoxide levels in the blood return to normal. Your oxygen level increases.  After 24 hours, the chance of having a heart attack starts to decrease. Your breath, hair, and body stop smelling like smoke.  After 48 hours, damaged nerve endings begin to recover. Your sense of taste and smell improve.  After 72 hours, the body is virtually free of nicotine. Your bronchial tubes relax and breathing becomes easier.  After 2 to 12 weeks, lungs can hold more air. Exercise becomes easier and circulation improves.  The risk of having a heart attack, stroke, cancer, or  lung disease is greatly reduced.  After 1 year, the risk of coronary heart disease is cut in half.  After 5 years, the risk of stroke falls to the same as a nonsmoker.  After 10 years, the risk of lung cancer is cut in half and the risk of other cancers decreases significantly.  After 15 years, the risk of coronary heart disease drops, usually to the level of a nonsmoker.  If you are pregnant, quitting smoking will improve your chances  of having a healthy baby.  The people you live with, especially any children, will be healthier.  You will have extra money to spend on things other than cigarettes. QUESTIONS TO THINK ABOUT BEFORE ATTEMPTING TO QUIT You may want to talk about your answers with your health care provider.  Why do you want to quit?  If you tried to quit in the past, what helped and what did not?  What will be the most difficult situations for you after you quit? How will you plan to handle them?  Who can help you through the tough times? Your family? Friends? A health care provider?  What pleasures do you get from smoking? What ways can you still get pleasure if you quit? Here are some questions to ask your health care provider:  How can you help me to be successful at quitting?  What medicine do you think would be best for me and how should I take it?  What should I do if I need more help?  What is smoking withdrawal like? How can I get information on withdrawal? GET READY  Set a quit date.  Change your environment by getting rid of all cigarettes, ashtrays, matches, and lighters in your home, car, or work. Do not let people smoke in your home.  Review your past attempts to quit. Think about what worked and what did not. GET SUPPORT AND ENCOURAGEMENT You have a better chance of being successful if you have help. You can get support in many ways.  Tell your family, friends, and coworkers that you are going to quit and need their support. Ask them not to  smoke around you.  Get individual, group, or telephone counseling and support. Programs are available at [employer] and health centers. Call your local health department for information about programs in your area.  Spiritual beliefs and practices may help some smokers quit.  Download a "quit meter" on your computer to keep track of quit statistics, such as how long you have gone without smoking, cigarettes not smoked, and money saved.  Get a self-help book about quitting smoking and staying off tobacco. LEARN NEW SKILLS AND BEHAVIORS  Distract yourself from urges to smoke. Talk to someone, go for a walk, or occupy your time with a task.  Change your normal routine. Take a different route to work. Drink tea instead of coffee. Eat breakfast in a different place.  Reduce your stress. Take a hot bath, exercise, or read a book.  Plan something enjoyable to do every day. Reward yourself for not smoking.  Explore interactive web-based programs that specialize in helping you quit. GET MEDICINE AND USE IT CORRECTLY Medicines can help you stop smoking and decrease the urge to smoke. Combining medicine with the above behavioral methods and support can greatly increase your chances of successfully quitting smoking.  Nicotine replacement therapy helps deliver nicotine to your body without the negative effects and risks of smoking. Nicotine replacement therapy includes nicotine gum, lozenges, inhalers, nasal sprays, and skin patches. Some may be available over-the-counter and others require a prescription.  Antidepressant medicine helps people abstain from smoking, but how this works is unknown. This medicine is available by prescription.  Nicotinic receptor partial agonist medicine simulates the effect of nicotine in your brain. This medicine is available by prescription. Ask your health care provider for advice about which medicines to use and how to use them based on your health history. Your  health care provider will tell you what  side effects to look out for if you choose to be on a medicine or therapy. Carefully read the information on the package. Do not use any other product containing nicotine while using a nicotine replacement product.  RELAPSE OR DIFFICULT SITUATIONS Most relapses occur within the first 3 months after quitting. Do not be discouraged if you start smoking again. Remember, most people try several times before finally quitting. You may have symptoms of withdrawal because your body is used to nicotine. You may crave cigarettes, be irritable, feel very hungry, cough often, get headaches, or have difficulty concentrating. The withdrawal symptoms are only temporary. They are strongest when you first quit, but they will go away within 10-14 days. To reduce the chances of relapse, try to:  Avoid drinking alcohol. Drinking lowers your chances of successfully quitting.  Reduce the amount of caffeine you consume. Once you quit smoking, the amount of caffeine in your body increases and can give you symptoms, such as a rapid heartbeat, sweating, and anxiety.  Avoid smokers because they can make you want to smoke.  Do not let weight gain distract you. Many smokers will gain weight when they quit, usually less than 10 pounds. Eat a healthy diet and stay active. You can always lose the weight gained after you quit.  Find ways to improve your mood other than smoking. FOR MORE INFORMATION  www.smokefree.gov  Document Released: 12/09/2001 Document Revised: 05/01/2014 Document Reviewed: 03/25/2012 [facility] Patient Information 2015 [place], [place]. This information is not intended to replace advice given to you by your health care provider. Make sure you discuss any questions you have with your health care provider.  Allergic Rhinitis Allegra or Claritin Robitussin Dm  Allergic rhinitis is when the mucous membranes in the nose respond to allergens. Allergens are particles in the  air that cause your body to have an allergic reaction. This causes you to release allergic antibodies. Through a chain of events, these eventually cause you to release histamine into the blood stream. Although meant to protect the body, it is this release of histamine that causes your discomfort, such as frequent sneezing, congestion, and an itchy, runny nose.  CAUSES  Seasonal allergic rhinitis (hay fever) is caused by pollen allergens that may come from grasses, trees, and weeds. Year-round allergic rhinitis (perennial allergic rhinitis) is caused by allergens such as house dust mites, pet dander, and mold spores.  SYMPTOMS   Nasal stuffiness (congestion).  Itchy, runny nose with sneezing and tearing of the eyes. DIAGNOSIS  Your health care provider can help you determine the allergen or allergens that trigger your symptoms. If you and your health care provider are unable to determine the allergen, skin or blood testing may be used. TREATMENT  Allergic rhinitis does not have a cure, but it can be controlled by:  Medicines and allergy shots (immunotherapy).  Avoiding the allergen. Hay fever may often be treated with antihistamines in pill or nasal spray forms. Antihistamines block the effects of histamine. There are over-the-counter medicines that may help with nasal congestion and swelling around the eyes. Check with your health care provider before taking or giving this medicine.  If avoiding the allergen or the medicine prescribed do not work, there are many new medicines your health care provider can prescribe. Stronger medicine may be used if initial measures are ineffective. Desensitizing injections can be used if medicine and avoidance does not work. Desensitization is when a patient is given ongoing shots until the body becomes less sensitive to the  allergen. Make sure you follow up with your health care provider if problems continue. HOME CARE INSTRUCTIONS It is not possible to  completely avoid allergens, but you can reduce your symptoms by taking steps to limit your exposure to them. It helps to know exactly what you are allergic to so that you can avoid your specific triggers. SEEK MEDICAL CARE IF:   You have a fever.  You develop a cough that does not stop easily (persistent).  You have shortness of breath.  You start wheezing.  Symptoms interfere with normal daily activities. Document Released: 09/09/2001 Document Revised: 12/20/2013 Document Reviewed: 08/22/2013 [facility] Patient Information 2015 [place], [place]. This information is not intended to replace advice given to you by your health care provider. Make sure you discuss any questions you have with your health care provider.

## 2015-04-01 ENCOUNTER — Emergency Department (INDEPENDENT_AMBULATORY_CARE_PROVIDER_SITE_OTHER)
Admission: EM | Admit: 2015-04-01 | Discharge: 2015-04-01 | Disposition: A | Discharge status: Home / Self Care | Payer: Self-pay | Source: Home / Self Care | Attending: Emergency Medicine | Admitting: Emergency Medicine

## 2015-04-01 ENCOUNTER — Encounter (HOSPITAL_COMMUNITY): Payer: Self-pay | Admitting: Emergency Medicine

## 2015-04-01 DIAGNOSIS — J029 Acute pharyngitis, unspecified: Secondary | ICD-10-CM

## 2015-04-01 LAB — POCT RAPID STREP A: Streptococcus, Group A Screen (Direct): NEGATIVE

## 2015-04-01 MED ORDER — AMOXICILLIN 500 MG PO CAPS: 500.0000 mg | ORAL_CAPSULE | Freq: Two times a day (BID) | ORAL | Status: DC

## 2015-04-01 NOTE — ED Notes (Signed)
C/o strep throat for a couple of days now States ears and body is achy No treatments tried

## 2015-04-01 NOTE — ED Provider Notes (Signed)
CSN: [id number]     Arrival date & time 04/01/15  1322 History   First MD Initiated Contact with Patient 04/01/15 1357     Chief Complaint  Patient presents with  . Sore Throat   (Consider location/radiation/quality/duration/timing/severity/associated sxs/prior Treatment) HPI  She is a 44 year old woman here for evaluation of sore throat. She states this started about 3 days ago and has gradually been getting worse. It is worse with swallowing. She reports some mild rhinorrhea and sneezing, which she attributes to her allergies. She states she has developed body aches and some bilateral ear pain. She denies any fevers or chills. No nausea or vomiting. She is tolerating liquids well. She has not tried any medications at home.  Past Medical History  Diagnosis Date  . Depression   . Kidney stone    Past Surgical History  Procedure Laterality Date  . Tubal ligation    . Lithotripsy     History reviewed. No pertinent family history. History  Substance Use Topics  . Smoking status: Current Every Day Smoker  . Smokeless tobacco: Not on file  . Alcohol Use: No   OB History    No data available     Review of Systems  Constitutional: Negative for fever and chills.  HENT: Positive for ear pain, rhinorrhea, sneezing and sore throat. Negative for congestion.   Respiratory: Negative for cough and shortness of breath.   Gastrointestinal: Negative for nausea and vomiting.  Musculoskeletal: Positive for myalgias.    Allergies  Review of patient's allergies indicates no known allergies.  Home Medications   Prior to Admission medications   Medication Sig Start Date End Date Taking? Authorizing Provider  albuterol (PROVENTIL HFA;VENTOLIN HFA) 108 (90 BASE) MCG/ACT inhaler Inhale 2 puffs into the lungs every 4 (four) hours as needed for wheezing or shortness of breath. 08/28/14   Hayden Rasmussen, NP  amoxicillin (AMOXIL) 500 MG capsule Take 1 capsule (500 mg total) by mouth 2 (two) times daily.  04/01/15   Charm Rings, MD  HYDROcodone-acetaminophen (NORCO/VICODIN) 5-325 MG per tablet Take 1-2 tablets by mouth every 6 (six) hours as needed for pain. 08/16/13   Heather Laisure, PA-C  ibuprofen (ADVIL,MOTRIN) 200 MG tablet Take 600 mg by mouth every 6 (six) hours as needed for pain.    Historical Provider, MD  methocarbamol (ROBAXIN) 500 MG tablet Take 1 tablet (500 mg total) by mouth 2 (two) times daily. 08/16/13   Heather Laisure, PA-C  methylPREDNISolone (MEDROL DOSEPAK) 4 MG tablet As directed. Take with  food 08/28/14   Hayden Rasmussen, NP  naproxen (NAPROSYN) 500 MG tablet Take 1 tablet (500 mg total) by mouth 2 (two) times daily. 08/16/13   Heather Laisure, PA-C   BP 139/87 mmHg  Pulse 94  Temp(Src) 98 F (36.7 C) (Oral)  Resp 20  SpO2 100%  LMP 03/25/2015 (Exact Date) Physical Exam  Constitutional: She is oriented to person, place, and time. She appears well-developed and well-nourished. No distress.  HENT:  Head: Normocephalic and atraumatic.  Right Ear: Tympanic membrane normal.  Left Ear: Tympanic membrane normal.  Nose: Rhinorrhea present. No mucosal edema.  Mouth/Throat: Posterior oropharyngeal erythema present. No oropharyngeal exudate.  Neck: Neck supple.  Cardiovascular: Normal rate, regular rhythm and normal heart sounds.   No murmur heard. Pulmonary/Chest: Effort normal and breath sounds normal. No respiratory distress. She has no wheezes. She has no rales.  Lymphadenopathy:    She has no cervical adenopathy.  Neurological: She is alert and oriented to  person, place, and time.    ED Course  Procedures (including critical care time) Labs Review Labs Reviewed  POCT RAPID STREP A ([facility] URG CARE ONLY)    Imaging Review No results found.   MDM   1. Pharyngitis    Rapid strep negative, throat culture sent. I'm going to treat for bacterial pharyngitis with amoxicillin. Also Zyrtec for allergy symptoms. Follow-up as needed.    Charm RingsErin J [name], MD 04/01/15 [phone]

## 2015-04-01 NOTE — Discharge Instructions (Signed)
Take amoxicillin 1 pill twice a day for 10 days. You can see her grandson after you've been on antibiotics for 24 hours. Use Chloraseptic spray, Cepacol lozenges, salt water gargles for the throat. Start taking Zyrtec 1 pill daily. Follow-up as needed.

## 2015-04-04 ENCOUNTER — Telehealth (HOSPITAL_COMMUNITY): Payer: Self-pay | Admitting: *Deleted

## 2015-04-04 LAB — CULTURE, GROUP A STREP

## 2015-04-04 NOTE — ED Notes (Addendum)
Throat culture: Strep beta hemolytic not group A.  Pt. adequately treated with Amoxicillin. I called but phone number is incorrect. Contact has that same number.  Will send a letter. Stacie MoselleYork, Stacie Sanders M 04/04/2015 Confidential marked letter sent with result and instructions. 04/13/2015

## 2015-04-16 ENCOUNTER — Emergency Department (HOSPITAL_COMMUNITY)
Admission: EM | Admit: 2015-04-16 | Discharge: 2015-04-16 | Disposition: A | Discharge status: Home / Self Care | Payer: Self-pay | Attending: Emergency Medicine | Admitting: Emergency Medicine

## 2015-04-16 ENCOUNTER — Encounter (HOSPITAL_COMMUNITY): Payer: Self-pay | Admitting: Emergency Medicine

## 2015-04-16 DIAGNOSIS — R05 Cough: Secondary | ICD-10-CM

## 2015-04-16 DIAGNOSIS — R059 Cough, unspecified: Secondary | ICD-10-CM

## 2015-04-16 DIAGNOSIS — Z79899 Other long term (current) drug therapy: Secondary | ICD-10-CM | POA: Insufficient documentation

## 2015-04-16 DIAGNOSIS — F329 Major depressive disorder, single episode, unspecified: Secondary | ICD-10-CM | POA: Insufficient documentation

## 2015-04-16 DIAGNOSIS — Z87442 Personal history of urinary calculi: Secondary | ICD-10-CM | POA: Insufficient documentation

## 2015-04-16 DIAGNOSIS — Z7982 Long term (current) use of aspirin: Secondary | ICD-10-CM | POA: Insufficient documentation

## 2015-04-16 DIAGNOSIS — R1310 Dysphagia, unspecified: Secondary | ICD-10-CM

## 2015-04-16 DIAGNOSIS — R630 Anorexia: Secondary | ICD-10-CM | POA: Insufficient documentation

## 2015-04-16 DIAGNOSIS — Z720 Tobacco use: Secondary | ICD-10-CM | POA: Insufficient documentation

## 2015-04-16 DIAGNOSIS — J029 Acute pharyngitis, unspecified: Secondary | ICD-10-CM | POA: Insufficient documentation

## 2015-04-16 MED ORDER — ALUM & MAG HYDROXIDE-SIMETH 200-200-20 MG/5ML PO SUSP
15.0000 mL | Freq: Once | ORAL | Status: AC
  Administered 2015-04-16: 15 mL via ORAL
  Filled 2015-04-16: qty 30

## 2015-04-16 MED ORDER — LIDOCAINE VISCOUS 2 % MT SOLN
15.0000 mL | Freq: Once | OROMUCOSAL | Status: AC
  Administered 2015-04-16: 15 mL via OROMUCOSAL
  Filled 2015-04-16: qty 15

## 2015-04-16 MED ORDER — PREDNISONE 10 MG PO TABS: 40.0000 mg | ORAL_TABLET | Freq: Every day | ORAL | Status: AC

## 2015-04-16 MED ORDER — CLINDAMYCIN HCL 150 MG PO CAPS: 300.0000 mg | ORAL_CAPSULE | Freq: Three times a day (TID) | ORAL | Status: AC

## 2015-04-16 NOTE — ED Provider Notes (Signed)
CSN: [id number]     Arrival date & time 04/16/15  1250 History   First MD Initiated Contact with Patient 04/16/15 1641     Chief Complaint  Patient presents with  . Sore Throat     (Consider location/radiation/quality/duration/timing/severity/associated sxs/prior Treatment) Patient is a 44 y.o. female presenting with pharyngitis. The history is provided by the patient. No language interpreter was used.  Sore Throat This is a new problem. The current episode started 1 to 4 weeks ago. The problem occurs constantly. The problem has been unchanged. Associated symptoms include anorexia, coughing and a sore throat. Pertinent negatives include no abdominal pain, change in bowel habit, chest pain, chills, congestion, fatigue, fever, headaches, myalgias, nausea, numbness, rash, vomiting or weakness. The symptoms are aggravated by eating, drinking and coughing.    Past Medical History  Diagnosis Date  . Depression   . Kidney stone    Past Surgical History  Procedure Laterality Date  . Tubal ligation    . Lithotripsy     History reviewed. No pertinent family history. History  Substance Use Topics  . Smoking status: Current Every Day Smoker  . Smokeless tobacco: Not on file  . Alcohol Use: No   OB History    No data available     Review of Systems  Constitutional: Negative for fever, chills and fatigue.  HENT: Positive for sore throat and trouble swallowing (painful but still able to swallow normally). Negative for congestion and voice change.   Respiratory: Positive for cough. Negative for chest tightness, shortness of breath, wheezing and stridor.   Cardiovascular: Negative for chest pain.  Gastrointestinal: Positive for anorexia. Negative for nausea, vomiting, abdominal pain and change in bowel habit.  Musculoskeletal: Negative for myalgias.  Skin: Negative for rash.  Neurological: Negative for weakness, light-headedness, numbness and headaches.  Psychiatric/Behavioral: Negative  for confusion.  All other systems reviewed and are negative.     Allergies  Review of patient's allergies indicates no known allergies.  Home Medications   Prior to Admission medications   Medication Sig Start Date End Date Taking? Authorizing Provider  albuterol (PROVENTIL HFA;VENTOLIN HFA) 108 (90 BASE) MCG/ACT inhaler Inhale 2 puffs into the lungs every 4 (four) hours as needed for wheezing or shortness of breath. 08/28/14  Yes Hayden Rasmussenavid Mabe, NP  ibuprofen (ADVIL,MOTRIN) 200 MG tablet Take 600 mg by mouth every 6 (six) hours as needed for pain.   Yes Historical Provider, MD  lithium carbonate 300 MG capsule Take 900 mg by mouth at bedtime.   Yes Historical Provider, MD  lurasidone (LATUDA) 80 MG TABS tablet Take 80 mg by mouth daily.   Yes Historical Provider, MD  topiramate (TOPAMAX) 25 MG tablet Take 75 mg by mouth daily.   Yes Historical Provider, MD  amoxicillin (AMOXIL) 500 MG capsule Take 1 capsule (500 mg total) by mouth 2 (two) times daily. Patient not taking: Reported on 04/16/2015 04/01/15   Charm RingsErin J Honig, MD  HYDROcodone-acetaminophen (NORCO/VICODIN) 5-325 MG per tablet Take 1-2 tablets by mouth every 6 (six) hours as needed for pain. Patient not taking: Reported on 04/16/2015 08/16/13   Santiago GladHeather Laisure, PA-C  methocarbamol (ROBAXIN) 500 MG tablet Take 1 tablet (500 mg total) by mouth 2 (two) times daily. Patient not taking: Reported on 04/16/2015 08/16/13   Santiago GladHeather Laisure, PA-C  methylPREDNISolone (MEDROL DOSEPAK) 4 MG tablet As directed. Take with  food Patient not taking: Reported on 04/16/2015 08/28/14   Hayden Rasmussenavid Mabe, NP  naproxen (NAPROSYN) 500 MG tablet Take 1  tablet (500 mg total) by mouth 2 (two) times daily. Patient not taking: Reported on 04/16/2015 08/16/13   Santiago Glad, PA-C   BP 135/88 mmHg  Pulse 109  Temp(Src) 98.4 F (36.9 C) (Oral)  Resp 18  SpO2 96%  LMP 03/25/2015 (Exact Date) Physical Exam  Constitutional: She appears well-developed and well-nourished. She  does not have a sickly appearance. No distress.  HENT:  Head: Normocephalic.  Nose: Nose normal.  Mouth/Throat: Oropharynx is clear and moist. No oral lesions. No trismus in the jaw. Normal dentition. No uvula swelling. No oropharyngeal exudate, posterior oropharyngeal edema, posterior oropharyngeal erythema or tonsillar abscesses.  She is speaking in full sentences with normal phonation.  No posterior erythema or exudate, no tonsillar hypertrophy.  No stridor.  Mild left submandibular lymphadenopathy.    Eyes: EOM are normal. Pupils are equal, round, and reactive to light.  Neck: Normal range of motion. Neck supple.  Cardiovascular: Normal rate, regular rhythm, normal heart sounds and intact distal pulses.   No murmur heard. Pulmonary/Chest: Effort normal and breath sounds normal. No respiratory distress. She has no wheezes. She exhibits no tenderness.  Abdominal: Soft. There is no tenderness. There is no rebound and no guarding.  Musculoskeletal: Normal range of motion. She exhibits no tenderness.  Lymphadenopathy:    She has no cervical adenopathy.  Neurological: She is alert. No cranial nerve deficit. Coordination normal.  Skin: Skin is warm and dry. She is not diaphoretic.  Psychiatric: She has a normal mood and affect. Her behavior is normal. Judgment and thought content normal.  Nursing note and vitals reviewed.  ED Course  Procedures (including critical care time) Labs Review Labs Reviewed - No data to display  Imaging Review No results found.   EKG Interpretation None      MDM   Final diagnoses:  Dysphagia  Cough  Sore throat   Pt is a 44 yo F with hx of depression who presents with cough and sore throat x 2 weeks.  She was seen at urgent care 2 weeks ago with similar sx.  She had a rapid strep that was negative but was given a course of Amoxil due to sx.  She reports completing the Abx but the sx still continued.  She complains of anterior throat pain, worse on left.   Pain worsens with swallowing talking.  States she hasn't been able to eat or drink much due to pain.  Denies SOB, tongue swelling, or face/ear pain.  She is speaking in full sentences with normal phonation.  No posterior erythema or exudate, no tonsillar hypertrophy.  No stridor.  Mild left submandibular lymphadenopathy.    Epic review showed a + strep throat culture from her previous visit.  Due to prolonged sx, will give steroids and clindamycin.  She was advised to follow up with PCP but states that she does not have insurance and doesn't have a PCP.  She was given the # to the wellness clinic.   All questions were answered and ED return precautions were discussed prior to dc in good condition.   Patient was seen with ED Attending, Dr. Sharl Ma, MD    Lenell Antu, MD 04/17/15 1610  Dione Booze, MD 04/17/15 [phone]

## 2015-04-16 NOTE — ED Notes (Signed)
Pt sts sore throat x 2 weeks; [facility]; pt sts no improvement

## 2015-05-14 ENCOUNTER — Encounter (HOSPITAL_COMMUNITY): Payer: Self-pay | Admitting: Emergency Medicine

## 2015-05-14 ENCOUNTER — Emergency Department (INDEPENDENT_AMBULATORY_CARE_PROVIDER_SITE_OTHER)
Admission: EM | Admit: 2015-05-14 | Discharge: 2015-05-14 | Disposition: A | Discharge status: Home / Self Care | Payer: Self-pay | Source: Home / Self Care | Attending: Family Medicine | Admitting: Family Medicine

## 2015-05-14 DIAGNOSIS — K602 Anal fissure, unspecified: Secondary | ICD-10-CM

## 2015-05-14 DIAGNOSIS — K629 Disease of anus and rectum, unspecified: Secondary | ICD-10-CM

## 2015-05-14 MED ORDER — DILTIAZEM GEL 2 %: 1.0000 "application " | Freq: Two times a day (BID) | CUTANEOUS | Status: DC

## 2015-05-14 NOTE — Discharge Instructions (Signed)
Thank you for coming in today. Use the diltiazem gel. Return as needed  [facility] Address: [address], [place], [place] [address] [phone] $27  [facility]: Address: [address], [place], [place] [address] [phone]    Anal Fissure, Adult An anal fissure is a small tear or crack in the skin around the anus. Bleeding from a fissure usually stops on its own within a few minutes. However, bleeding will often reoccur with each bowel movement until the crack heals.  CAUSES   Passing large, hard stools.  Frequent diarrheal stools.  Constipation.  Inflammatory bowel disease (Crohn's disease or ulcerative colitis).  Infections.  Anal sex. SYMPTOMS   Small amounts of blood seen on your stools, on toilet paper, or in the toilet after a bowel movement.  Rectal bleeding.  Painful bowel movements.  Itching or irritation around the anus. DIAGNOSIS Your caregiver will examine the anal area. An anal fissure can usually be seen with careful inspection. A rectal exam may be performed and a short tube (anoscope) may be used to examine the anal canal. TREATMENT   You may be instructed to take fiber supplements. These supplements can soften your stool to help make bowel movements easier.  Sitz baths may be recommended to help heal the tear. Do not use soap in the sitz baths.  A medicated cream or ointment may be prescribed to lessen discomfort. HOME CARE INSTRUCTIONS   Maintain a diet high in fruits, whole grains, and vegetables. Avoid constipating foods like bananas and dairy products.  Take sitz baths as directed by your caregiver.  Drink enough fluids to keep your urine clear or pale yellow.  Only take over-the-counter or prescription medicines for pain, discomfort, or fever as directed by your caregiver. Do not take aspirin as this may increase bleeding.  Do not use ointments containing numbing medications (anesthetics) or  hydrocortisone. They could slow healing. SEEK MEDICAL CARE IF:   Your fissure is not completely healed within 3 days.  You have further bleeding.  You have a fever.  You have diarrhea mixed with blood.  You have pain.  Your problem is getting worse rather than better. MAKE SURE YOU:   Understand these instructions.  Will watch your condition.  Will get help right away if you are not doing well or get worse. Document Released: 12/15/2005 Document Revised: 03/08/2012 Document Reviewed: 06/01/2011 [facility] Patient Information 2015 [employer] [place]. This information is not intended to replace advice given to you by your health care provider. Make sure you discuss any questions you have with your health care provider.

## 2015-05-14 NOTE — ED Notes (Signed)
Pt states that she has had rectal pain when she is trying to have bowel movement and its very painful to where she can not

## 2015-05-14 NOTE — ED Provider Notes (Signed)
Stacie Sanders is a 44 y.o. female who presents to Urgent Care today for Rectal pain. Patient is a 42 history of rectal plan associated with bright red blood per rectum with bowel movements. She has a pain in pinching feeling with bowel movements. Symptoms started after a particularly large and firm bowel movement. No fevers or chills nausea vomiting or diarrhea. She has tried Tucks pads and Preparation H. She has a had a history of a hemorrhoid following the birth of her child and notes that that is not causing a problem now and is not painful.   Past Medical History  Diagnosis Date  . Depression   . Kidney stone    Past Surgical History  Procedure Laterality Date  . Tubal ligation    . Lithotripsy     History  Substance Use Topics  . Smoking status: Current Every Day Smoker  . Smokeless tobacco: Not on file  . Alcohol Use: No   ROS as above Medications: No current facility-administered medications for this encounter.   Current Outpatient Prescriptions  Medication Sig Dispense Refill  . diltiazem 2 % GEL Apply 1 application topically 2 (two) times daily. 15 g 1  . lithium carbonate 300 MG capsule Take 900 mg by mouth at bedtime.    Marland Kitchen. lurasidone (LATUDA) 80 MG TABS tablet Take 80 mg by mouth daily.    Marland Kitchen. topiramate (TOPAMAX) 25 MG tablet Take 75 mg by mouth daily.     No Known Allergies   Exam:  BP 130/80 mmHg  Pulse 109  Temp(Src) 98.1 F (36.7 C) (Oral)  Resp 16  SpO2 100% Gen: Well NAD HEENT: EOMI,  MMM Lungs: Normal work of breathing. CTABL Heart: RRR no MRG Abd: NABS, Soft. Nondistended, Nontender Exts: Brisk capillary refill, warm and well perfused.  Anus: Small nontender non-engorged hemorrhoid at the 12:00 position. Digital rectal exam very painful area at the 12:00 position internally. Patient cannot tolerate a full digital rectal exam or anoscopic exam  No results found for this or any previous visit (from the past 24 hour(s)). No results found.  Assessment  and Plan: 44 y.o. female with rectal pain very likely to be rectal fissure based on history and exam. Exam is limited by pain. Treat with diltiazem gel. Follow-up as needed.  Discussed warning signs or symptoms. Please see discharge instructions. Patient expresses understanding.     Rodolph BongEvan S Landra Howze, MD 05/14/15 [phone]

## 2015-07-03 ENCOUNTER — Emergency Department (HOSPITAL_COMMUNITY)
Admission: EM | Admit: 2015-07-03 | Discharge: 2015-07-03 | Disposition: A | Discharge status: Home / Self Care | Payer: Self-pay | Attending: Emergency Medicine | Admitting: Emergency Medicine

## 2015-07-03 ENCOUNTER — Emergency Department (HOSPITAL_COMMUNITY): Payer: Self-pay

## 2015-07-03 ENCOUNTER — Encounter (HOSPITAL_COMMUNITY): Payer: Self-pay | Admitting: Emergency Medicine

## 2015-07-03 DIAGNOSIS — R112 Nausea with vomiting, unspecified: Secondary | ICD-10-CM | POA: Insufficient documentation

## 2015-07-03 DIAGNOSIS — R197 Diarrhea, unspecified: Secondary | ICD-10-CM | POA: Insufficient documentation

## 2015-07-03 DIAGNOSIS — F329 Major depressive disorder, single episode, unspecified: Secondary | ICD-10-CM | POA: Insufficient documentation

## 2015-07-03 DIAGNOSIS — Z79899 Other long term (current) drug therapy: Secondary | ICD-10-CM | POA: Insufficient documentation

## 2015-07-03 DIAGNOSIS — R1011 Right upper quadrant pain: Secondary | ICD-10-CM | POA: Insufficient documentation

## 2015-07-03 DIAGNOSIS — Z720 Tobacco use: Secondary | ICD-10-CM | POA: Insufficient documentation

## 2015-07-03 DIAGNOSIS — R630 Anorexia: Secondary | ICD-10-CM | POA: Insufficient documentation

## 2015-07-03 DIAGNOSIS — Z87442 Personal history of urinary calculi: Secondary | ICD-10-CM | POA: Insufficient documentation

## 2015-07-03 LAB — CBC WITH DIFFERENTIAL/PLATELET
BASOS ABS: 0 10*3/uL (ref 0.0–0.1)
Basophils Relative: 0 % (ref 0–1)
Eosinophils Absolute: 0.2 10*3/uL (ref 0.0–0.7)
Eosinophils Relative: 1 % (ref 0–5)
HEMATOCRIT: 45.2 % (ref 36.0–46.0)
HEMOGLOBIN: 15.5 g/dL — AB (ref 12.0–15.0)
Lymphocytes Relative: 18 % (ref 12–46)
Lymphs Abs: 3.3 10*3/uL (ref 0.7–4.0)
MCH: 33 pg (ref 26.0–34.0)
MCHC: 34.3 g/dL (ref 30.0–36.0)
MCV: 96.4 fL (ref 78.0–100.0)
Monocytes Absolute: 1.1 10*3/uL — ABNORMAL HIGH (ref 0.1–1.0)
Monocytes Relative: 6 % (ref 3–12)
NEUTROS ABS: 13.3 10*3/uL — AB (ref 1.7–7.7)
Neutrophils Relative %: 75 % (ref 43–77)
Platelets: 392 10*3/uL (ref 150–400)
RBC: 4.69 MIL/uL (ref 3.87–5.11)
RDW: 13.5 % (ref 11.5–15.5)
WBC: 18 10*3/uL — ABNORMAL HIGH (ref 4.0–10.5)

## 2015-07-03 LAB — LACTIC ACID, PLASMA: Lactic Acid, Venous: 0.9 mmol/L (ref 0.5–2.0)

## 2015-07-03 LAB — COMPREHENSIVE METABOLIC PANEL
ALT: 42 U/L (ref 14–54)
ANION GAP: 10 (ref 5–15)
AST: 35 U/L (ref 15–41)
Albumin: 4.2 g/dL (ref 3.5–5.0)
Alkaline Phosphatase: 96 U/L (ref 38–126)
BUN: 9 mg/dL (ref 6–20)
CO2: 21 mmol/L — AB (ref 22–32)
CREATININE: 0.85 mg/dL (ref 0.44–1.00)
Calcium: 9.4 mg/dL (ref 8.9–10.3)
Chloride: 106 mmol/L (ref 101–111)
GFR calc Af Amer: >60 mL/min (ref >60)
Glucose, Bld: 113 mg/dL — ABNORMAL HIGH (ref 65–99)
Potassium: 4 mmol/L (ref 3.5–5.1)
Sodium: 137 mmol/L (ref 135–145)
Total Bilirubin: 0.5 mg/dL (ref 0.3–1.2)
Total Protein: 7.4 g/dL (ref 6.5–8.1)

## 2015-07-03 LAB — URINALYSIS, ROUTINE W REFLEX MICROSCOPIC
Bilirubin Urine: NEGATIVE
Glucose, UA: NEGATIVE mg/dL
Hgb urine dipstick: NEGATIVE
KETONES UR: NEGATIVE mg/dL
LEUKOCYTES UA: NEGATIVE
NITRITE: NEGATIVE
Protein, ur: NEGATIVE mg/dL
Specific Gravity, Urine: 1.008 (ref 1.005–1.030)
Urobilinogen, UA: 0.2 mg/dL (ref 0.0–1.0)
pH: 7 (ref 5.0–8.0)

## 2015-07-03 LAB — LIPASE, BLOOD: LIPASE: 25 U/L (ref 22–51)

## 2015-07-03 MED ORDER — SODIUM CHLORIDE 0.9 % IV SOLN
1000.0000 mL | Freq: Once | INTRAVENOUS | Status: AC
  Administered 2015-07-03: 1000 mL via INTRAVENOUS

## 2015-07-03 MED ORDER — IOHEXOL 300 MG/ML  SOLN
25.0000 mL | Freq: Once | INTRAMUSCULAR | Status: AC | PRN
Start: 2015-07-03 — End: 2015-07-03
  Administered 2015-07-03: 25 mL via ORAL

## 2015-07-03 MED ORDER — SODIUM CHLORIDE 0.9 % IV SOLN: 1000.0000 mL | Freq: Once | INTRAVENOUS | Status: DC

## 2015-07-03 MED ORDER — ONDANSETRON HCL 4 MG/2ML IJ SOLN
4.0000 mg | Freq: Once | INTRAMUSCULAR | Status: AC
  Administered 2015-07-03: 4 mg via INTRAVENOUS
  Filled 2015-07-03: qty 2

## 2015-07-03 MED ORDER — IOHEXOL 300 MG/ML  SOLN
100.0000 mL | Freq: Once | INTRAMUSCULAR | Status: AC | PRN
  Administered 2015-07-03: 100 mL via INTRAVENOUS

## 2015-07-03 MED ORDER — SODIUM CHLORIDE 0.9 % IV SOLN: 1000.0000 mL | INTRAVENOUS | Status: DC

## 2015-07-03 MED ORDER — ONDANSETRON 4 MG PREPACK (~~LOC~~): 1.0000 | ORAL_TABLET | Freq: Two times a day (BID) | ORAL | Status: DC

## 2015-07-03 MED ORDER — HYDROCODONE-ACETAMINOPHEN 5-325 MG PO TABS: 1.0000 | ORAL_TABLET | Freq: Four times a day (QID) | ORAL | Status: DC | PRN

## 2015-07-03 NOTE — ED Provider Notes (Signed)
CSN: [id number]     Arrival date & time 07/03/15  [id number] History   First MD Initiated Contact with Patient 07/03/15 [phone]     Chief Complaint  Patient presents with  . Abdominal Pain   Patient is a 44 y.o. female presenting with abdominal pain. The history is provided by the patient.  Abdominal Pain Pain location:  RUQ and R flank Pain quality: aching   Pain radiates to:  Does not radiate Pain severity:  Moderate Onset quality:  Gradual Duration:  5 days Timing:  Constant Progression:  Worsening Chronicity:  New Context: not alcohol use, not previous surgeries and not recent travel   Relieved by:  Nothing Worsened by:  Eating and palpation Ineffective treatments:  Not moving and belching Associated symptoms: anorexia, diarrhea, nausea and vomiting   Associated symptoms: no belching, no chest pain, no chills, no dysuria, no fatigue, no fever, no flatus, no hematuria, no melena, no shortness of breath, no sore throat, no vaginal bleeding and no vaginal discharge     Past Medical History  Diagnosis Date  . Depression   . Kidney stone    Past Surgical History  Procedure Laterality Date  . Tubal ligation    . Lithotripsy     No family history on file. History  Substance Use Topics  . Smoking status: Current Every Day Smoker    Types: Cigarettes  . Smokeless tobacco: Not on file  . Alcohol Use: Yes     Comment: occasionally   OB History    No data available     Review of Systems  Constitutional: Negative for fever, chills and fatigue.  HENT: Negative for sore throat.   Respiratory: Negative for shortness of breath.   Cardiovascular: Negative for chest pain.  Gastrointestinal: Positive for nausea, vomiting, abdominal pain, diarrhea and anorexia. Negative for blood in stool, melena, abdominal distention and flatus.  Genitourinary: Negative for dysuria, hematuria, vaginal bleeding, vaginal discharge and vaginal pain.  Musculoskeletal: Negative for back pain, neck pain and  neck stiffness.  Skin: Negative for rash.  Neurological: Negative for dizziness, syncope and light-headedness.  Psychiatric/Behavioral: Negative for confusion.   Allergies  Review of patient's allergies indicates no known allergies.  Home Medications   Prior to Admission medications   Medication Sig Start Date End Date Taking? Authorizing Provider  clonazePAM (KLONOPIN) 2 MG tablet Take 2 mg by mouth at bedtime.   Yes Historical Provider, MD  lithium carbonate 300 MG capsule Take 1,200 mg by mouth at bedtime.    Yes Historical Provider, MD  topiramate (TOPAMAX) 25 MG tablet Take 100 mg by mouth daily.    Yes Historical Provider, MD  diltiazem 2 % GEL Apply 1 application topically 2 (two) times daily. Patient not taking: Reported on 07/03/2015 05/14/15   Rodolph BongEvan S Corey, MD  HYDROcodone-acetaminophen (NORCO/VICODIN) 5-325 MG per tablet Take 1-2 tablets by mouth every 6 (six) hours as needed. 07/03/15   Tery SanfilippoMatthew Eraina Winnie, MD  lurasidone (LATUDA) 80 MG TABS tablet Take 80 mg by mouth daily.    Historical Provider, MD  ondansetron (ZOFRAN) 4 mg TABS tablet Take 4 tablets by mouth every 12 (twelve) hours. 07/03/15   Tery SanfilippoMatthew Shauntel Prest, MD   BP 113/73 mmHg  Pulse 76  Temp(Src) 98.8 F (37.1 C) (Oral)  Resp 16  Ht 5' 5.5" (1.664 m)  Wt 230 lb (104.327 kg)  BMI 37.68 kg/m2  SpO2 98%  LMP 06/16/2015 (Approximate) Physical Exam  Constitutional: She is oriented to person, place, and time.  She appears well-developed and well-nourished. No distress.  HENT:  Head: Normocephalic and atraumatic.  Eyes: Conjunctivae and EOM are normal. Pupils are equal, round, and reactive to light.  Neck: Normal range of motion. Neck supple.  Cardiovascular: Normal rate, regular rhythm and normal heart sounds.   Pulmonary/Chest: Effort normal and breath sounds normal. No respiratory distress.  Abdominal: Soft. Bowel sounds are normal. There is tenderness in the right upper quadrant. There is positive Murphy's sign. There is  no rigidity, no guarding and no tenderness at McBurney's point.  Musculoskeletal: Normal range of motion.  Neurological: She is alert and oriented to person, place, and time. She has normal reflexes. No cranial nerve deficit.  Skin: Skin is warm and dry. She is not diaphoretic.  Psychiatric: She has a normal mood and affect.    ED Course  Procedures (including critical care time) Labs Review Labs Reviewed  CBC WITH DIFFERENTIAL/PLATELET - Abnormal; Notable for the following:    WBC 18.0 (*)    Hemoglobin 15.5 (*)    Neutro Abs 13.3 (*)    Monocytes Absolute 1.1 (*)    All other components within normal limits  COMPREHENSIVE METABOLIC PANEL - Abnormal; Notable for the following:    CO2 21 (*)    Glucose, Bld 113 (*)    All other components within normal limits  LIPASE, BLOOD  URINALYSIS, ROUTINE W REFLEX MICROSCOPIC (NOT AT [facility])  LACTIC ACID, PLASMA    Imaging Review Ct Abdomen Pelvis W Contrast  07/03/2015   CLINICAL DATA:  44 year old female with right upper quadrant and right flank pain for 5 days. Initial encounter. Nephrolithiasis  EXAM: CT ABDOMEN AND PELVIS WITH CONTRAST  TECHNIQUE: Multidetector CT imaging of the abdomen and pelvis was performed using the standard protocol following bolus administration of intravenous contrast.  CONTRAST:  OMNIPAQUE IOHEXOL 300 MG/ML  SOLN  COMPARISON:  Lumbar MRI 11/14/2008  FINDINGS: Mild respiratory motion at the lung bases which appear negative aside from dependent atelectasis. No pericardial or pleural effusion.  No acute osseous abnormality identified.  No pelvic free fluid. Negative uterus and adnexa; simple fluid density 4.2 cm right adnexal cyst.  Decompressed distal colon. Unremarkable bladder. Decompressed left colon and splenic flexure. Negative transverse colon. Negative right colon. Negative appendix suspected on series 2, image 76. Negative terminal ileum. No dilated small bowel. Oral contrast has not reached the distal small  bowel. Negative stomach and duodenum.  Diffuse hepatic steatosis. Mild sparing along the gallbladder fossa. Negative gallbladder, spleen, pancreas, and adrenal glands. No abdominal free fluid. The portal venous system is patent. Major arterial structures are patent.  Bilateral nephrolithiasis, on the right measuring up to 4 mm (lower pole). On the left stones measure up to 3-4 mm (upper pole). Symmetric renal enhancement and contrast excretion. No ureteral calculus identified. No lymphadenopathy. No focal inflammatory stranding.  IMPRESSION: 1. Bilateral nephrolithiasis without obstructive uropathy. Stones are more numerous on the left. 2. Fatty liver disease. 3. No acute inflammation identified in the abdomen or pelvis.   Electronically Signed   By: Odessa Fleming M.D.   On: 07/03/2015 11:43     EKG Interpretation None      MDM   Final diagnoses:  Right upper quadrant pain    Pt is a 44 yo female with a hx of depression, nephrolithiasis and chronic diarrhea presenting for abdominal pain, n/v, and continued diarrhea for the last 5 days. Denies any frank fevers, chills, CP, or SOB.  Worse after eating and localized  to RUQ and right flank.  No dysuria or hematuria.    Ddx nephrolithiasis, uti/pyelo, cholecystitis, gastroenteritis, pancreatitis, appendicitis.   On initial evaluation pt HDS in NAD.  Murphy sign positive but no elevation of bili or  LFTs.  Leukocytosis but appears to have previous leukocytosis.  Urine with no signs of infection. Lipase WNL.  CT abdomen with contrast showing no appendicitis, cholecystitis, or pyelonephritis.  Multiple stones in kidneys bilaterally without obstruct.  CT positive for fatty liver but doubt this is source of pain.  Right adnexal cyst was present and possible etiology of pain.  Advised pt unsure of etiology of pain at this point but multiple possibities.  Prescribed zofran and pain management and educated on strict return precautions and to f/u with PCP in 2-3 days  if continued pain.   If performed, labs, EKGs, and imaging were reviewed/interpreted by myself and my attending and incorporated into medical decision making.  Discussed pertinent finding with patient or caregiver prior to discharge with no further questions.  Immediate return precautions given and pt or caregiver reports understanding.  Pt care supervised by my attending Dr. Lenis Noon, MD PGY-2  Emergency Medicine      Tery Sanfilippo, MD 07/03/15 2130  Geoffery Lyons, MD 07/04/15 [phone]

## 2015-07-03 NOTE — ED Notes (Signed)
To CT via stretcher

## 2015-07-03 NOTE — Discharge Instructions (Signed)
Abdominal Pain, Women °Abdominal (stomach, pelvic, or belly) pain can be caused by many things. It is important to tell your doctor: °· The location of the pain. °· Does it come and go or is it present all the time? °· Are there things that start the pain (eating certain foods, exercise)? °· Are there other symptoms associated with the pain (fever, nausea, vomiting, diarrhea)? °All of this is helpful to know when trying to find the cause of the pain. °CAUSES  °· Stomach: virus or bacteria infection, or ulcer. °· Intestine: appendicitis (inflamed appendix), regional ileitis (Crohn's disease), ulcerative colitis (inflamed colon), irritable bowel syndrome, diverticulitis (inflamed diverticulum of the colon), or cancer of the stomach or intestine. °· Gallbladder disease or stones in the gallbladder. °· Kidney disease, kidney stones, or infection. °· Pancreas infection or cancer. °· Fibromyalgia (pain disorder). °· Diseases of the female organs: °¨ Uterus: fibroid (non-cancerous) tumors or infection. °¨ Fallopian tubes: infection or tubal pregnancy. °¨ Ovary: cysts or tumors. °¨ Pelvic adhesions (scar tissue). °¨ Endometriosis (uterus lining tissue growing in the pelvis and on the pelvic organs). °¨ Pelvic congestion syndrome (female organs filling up with blood just before the menstrual period). °¨ Pain with the menstrual period. °¨ Pain with ovulation (producing an egg). °¨ Pain with an IUD (intrauterine device, birth control) in the uterus. °¨ Cancer of the female organs. °· Functional pain (pain not caused by a disease, may improve without treatment). °· Psychological pain. °· Depression. °DIAGNOSIS  °Your doctor will decide the seriousness of your pain by doing an examination. °· Blood tests. °· X-rays. °· Ultrasound. °· CT scan (computed tomography, special type of X-ray). °· MRI (magnetic resonance imaging). °· Cultures, for infection. °· Barium enema (dye inserted in the large intestine, to better view it with  X-rays). °· Colonoscopy (looking in intestine with a lighted tube). °· Laparoscopy (minor surgery, looking in abdomen with a lighted tube). °· Major abdominal exploratory surgery (looking in abdomen with a large incision). °TREATMENT  °The treatment will depend on the cause of the pain.  °· Many cases can be observed and treated at home. °· Over-the-counter medicines recommended by your caregiver. °· Prescription medicine. °· Antibiotics, for infection. °· Birth control pills, for painful periods or for ovulation pain. °· Hormone treatment, for endometriosis. °· Nerve blocking injections. °· Physical therapy. °· Antidepressants. °· Counseling with a psychologist or psychiatrist. °· Minor or major surgery. °HOME CARE INSTRUCTIONS  °· Do not take laxatives, unless directed by your caregiver. °· Take over-the-counter pain medicine only if ordered by your caregiver. Do not take aspirin because it can cause an upset stomach or bleeding. °· Try a clear liquid diet (broth or water) as ordered by your caregiver. Slowly move to a bland diet, as tolerated, if the pain is related to the stomach or intestine. °· Have a thermometer and take your temperature several times a day, and record it. °· Bed rest and sleep, if it helps the pain. °· Avoid sexual intercourse, if it causes pain. °· Avoid stressful situations. °· Keep your follow-up appointments and tests, as your caregiver orders. °· If the pain does not go away with medicine or surgery, you may try: °¨ Acupuncture. °¨ Relaxation exercises (yoga, meditation). °¨ Group therapy. °¨ Counseling. °SEEK MEDICAL CARE IF:  °· You notice certain foods cause stomach pain. °· Your home care treatment is not helping your pain. °· You need stronger pain medicine. °· You want your IUD removed. °· You feel faint or   lightheaded. °· You develop nausea and vomiting. °· You develop a rash. °· You are having side effects or an allergy to your medicine. °SEEK IMMEDIATE MEDICAL CARE IF:  °· Your  pain does not go away or gets worse. °· You have a fever. °· Your pain is felt only in portions of the abdomen. The right side could possibly be appendicitis. The left lower portion of the abdomen could be colitis or diverticulitis. °· You are passing blood in your stools (bright red or black tarry stools, with or without vomiting). °· You have blood in your urine. °· You develop chills, with or without a fever. °· You pass out. °MAKE SURE YOU:  °· Understand these instructions. °· Will watch your condition. °· Will get help right away if you are not doing well or get worse. °Document Released: 10/12/2007 Document Revised: 05/01/2014 Document Reviewed: 11/01/2009 °Exit[employer]® Patient Information ©2015 [employer]. This information is not intended to replace advice given to you by your health care provider. Make sure you discuss any questions you have with your health care provider. ° °

## 2015-07-10 ENCOUNTER — Emergency Department (HOSPITAL_COMMUNITY)
Admission: EM | Admit: 2015-07-10 | Discharge: 2015-07-11 | Disposition: A | Discharge status: Home / Self Care | Payer: Self-pay | Attending: Emergency Medicine | Admitting: Emergency Medicine

## 2015-07-10 DIAGNOSIS — Z720 Tobacco use: Secondary | ICD-10-CM | POA: Insufficient documentation

## 2015-07-10 DIAGNOSIS — R112 Nausea with vomiting, unspecified: Secondary | ICD-10-CM | POA: Insufficient documentation

## 2015-07-10 DIAGNOSIS — Z79899 Other long term (current) drug therapy: Secondary | ICD-10-CM | POA: Insufficient documentation

## 2015-07-10 DIAGNOSIS — Z9851 Tubal ligation status: Secondary | ICD-10-CM | POA: Insufficient documentation

## 2015-07-10 DIAGNOSIS — Z3202 Encounter for pregnancy test, result negative: Secondary | ICD-10-CM | POA: Insufficient documentation

## 2015-07-10 DIAGNOSIS — Z87442 Personal history of urinary calculi: Secondary | ICD-10-CM | POA: Insufficient documentation

## 2015-07-10 DIAGNOSIS — F329 Major depressive disorder, single episode, unspecified: Secondary | ICD-10-CM | POA: Insufficient documentation

## 2015-07-10 DIAGNOSIS — R1011 Right upper quadrant pain: Secondary | ICD-10-CM | POA: Insufficient documentation

## 2015-07-10 LAB — I-STAT BETA HCG BLOOD, ED (MC, WL, AP ONLY): I-stat hCG, quantitative: <5 m[IU]/mL (ref <5)

## 2015-07-10 LAB — COMPREHENSIVE METABOLIC PANEL
ALT: 38 U/L (ref 14–54)
ANION GAP: 10 (ref 5–15)
AST: 25 U/L (ref 15–41)
Albumin: 4.1 g/dL (ref 3.5–5.0)
Alkaline Phosphatase: 92 U/L (ref 38–126)
BILIRUBIN TOTAL: 0.5 mg/dL (ref 0.3–1.2)
CHLORIDE: 108 mmol/L (ref 101–111)
CO2: 20 mmol/L — ABNORMAL LOW (ref 22–32)
Calcium: 9.5 mg/dL (ref 8.9–10.3)
Creatinine, Ser: 0.86 mg/dL (ref 0.44–1.00)
Glucose, Bld: 105 mg/dL — ABNORMAL HIGH (ref 65–99)
POTASSIUM: 3.5 mmol/L (ref 3.5–5.1)
Sodium: 138 mmol/L (ref 135–145)
Total Protein: 7.1 g/dL (ref 6.5–8.1)

## 2015-07-10 LAB — CBC
HEMATOCRIT: 42.1 % (ref 36.0–46.0)
HEMOGLOBIN: 14.2 g/dL (ref 12.0–15.0)
MCH: 32.3 pg (ref 26.0–34.0)
MCHC: 33.7 g/dL (ref 30.0–36.0)
MCV: 95.9 fL (ref 78.0–100.0)
Platelets: 366 10*3/uL (ref 150–400)
RBC: 4.39 MIL/uL (ref 3.87–5.11)
RDW: 13.3 % (ref 11.5–15.5)
WBC: 14.4 10*3/uL — ABNORMAL HIGH (ref 4.0–10.5)

## 2015-07-10 LAB — URINALYSIS, ROUTINE W REFLEX MICROSCOPIC
BILIRUBIN URINE: NEGATIVE
GLUCOSE, UA: NEGATIVE mg/dL
Ketones, ur: NEGATIVE mg/dL
Leukocytes, UA: NEGATIVE
Nitrite: NEGATIVE
PH: 6 (ref 5.0–8.0)
Protein, ur: NEGATIVE mg/dL
SPECIFIC GRAVITY, URINE: 1.011 (ref 1.005–1.030)
UROBILINOGEN UA: 0.2 mg/dL (ref 0.0–1.0)

## 2015-07-10 LAB — URINE MICROSCOPIC-ADD ON

## 2015-07-10 LAB — LIPASE, BLOOD: Lipase: 24 U/L (ref 22–51)

## 2015-07-10 MED ORDER — MORPHINE SULFATE 4 MG/ML IJ SOLN
4.0000 mg | Freq: Once | INTRAMUSCULAR | Status: AC
  Administered 2015-07-10: 4 mg via INTRAVENOUS
  Filled 2015-07-10: qty 1

## 2015-07-10 MED ORDER — ONDANSETRON HCL 4 MG/2ML IJ SOLN
4.0000 mg | Freq: Once | INTRAMUSCULAR | Status: AC
  Administered 2015-07-10: 4 mg via INTRAVENOUS
  Filled 2015-07-10: qty 2

## 2015-07-10 MED ORDER — SODIUM CHLORIDE 0.9 % IV BOLUS (SEPSIS)
1000.0000 mL | Freq: Once | INTRAVENOUS | Status: AC
  Administered 2015-07-10: 1000 mL via INTRAVENOUS

## 2015-07-10 NOTE — ED Provider Notes (Signed)
CSN: [id number]     Arrival date & time 07/10/15  2134 History   First MD Initiated Contact with Patient 07/10/15 2207     Chief Complaint  Patient presents with  . Abdominal Pain  . Emesis     (Consider location/radiation/quality/duration/timing/severity/associated sxs/prior Treatment) HPI Comments: Patient is a 44 year old female with a past medical history of kidney stones and depression who presents with abdominal pain for the past 2 weeks. The pain is located in the RUQ and does not radiate. The pain is described as aching and severe. The pain started gradually and progressively worsened since the onset. Eating, drinking, and palpation of the area makes the pain worse. No alleviating factors. The patient has tried nothing for symptoms without relief. Associated symptoms include nausea and at least 1 episode of vomiting per day. Patient was seen in the ED and evaluated with a CT abdomen pelvis 2 weeks ago which showed ovarian cyst but no other apparent cause for the pain. Patient denies fever, headache, diarrhea, chest pain, SOB, dysuria, constipation, abnormal vaginal bleeding/discharge. Patient reports history of tubal ligation but denies any other previous abdominal surgery.     Past Medical History  Diagnosis Date  . Depression   . Kidney stone    Past Surgical History  Procedure Laterality Date  . Tubal ligation    . Lithotripsy     No family history on file. History  Substance Use Topics  . Smoking status: Current Every Day Smoker    Types: Cigarettes  . Smokeless tobacco: Not on file  . Alcohol Use: Yes     Comment: occasionally   OB History    No data available     Review of Systems  Constitutional: Negative for fever, chills and fatigue.  HENT: Negative for trouble swallowing.   Eyes: Negative for visual disturbance.  Respiratory: Negative for shortness of breath.   Cardiovascular: Negative for chest pain and palpitations.  Gastrointestinal: Positive for  nausea, vomiting and abdominal pain. Negative for diarrhea.  Genitourinary: Negative for dysuria and difficulty urinating.  Musculoskeletal: Negative for arthralgias and neck pain.  Skin: Negative for color change.  Neurological: Negative for dizziness and weakness.  Psychiatric/Behavioral: Negative for dysphoric mood.      Allergies  Review of patient's allergies indicates no known allergies.  Home Medications   Prior to Admission medications   Medication Sig Start Date End Date Taking? Authorizing Provider  clonazePAM (KLONOPIN) 2 MG tablet Take 2 mg by mouth at bedtime.    Historical Provider, MD  diltiazem 2 % GEL Apply 1 application topically 2 (two) times daily. Patient not taking: Reported on 07/03/2015 05/14/15   Rodolph Bong, MD  HYDROcodone-acetaminophen (NORCO/VICODIN) 5-325 MG per tablet Take 1-2 tablets by mouth every 6 (six) hours as needed. 07/03/15   Tery Sanfilippo, MD  lithium carbonate 300 MG capsule Take 1,200 mg by mouth at bedtime.     Historical Provider, MD  lurasidone (LATUDA) 80 MG TABS tablet Take 80 mg by mouth daily.    Historical Provider, MD  ondansetron (ZOFRAN) 4 mg TABS tablet Take 4 tablets by mouth every 12 (twelve) hours. 07/03/15   Tery Sanfilippo, MD  topiramate (TOPAMAX) 25 MG tablet Take 100 mg by mouth daily.     Historical Provider, MD   BP 118/82 mmHg  Pulse 79  Temp(Src) 98.1 F (36.7 C) (Oral)  Resp 16  SpO2 97%  LMP 07/10/2015 (Exact Date) Physical Exam  Constitutional: She is oriented to person,  place, and time. She appears well-developed and well-nourished. No distress.  HENT:  Head: Normocephalic and atraumatic.  Eyes: Conjunctivae and EOM are normal.  Neck: Normal range of motion.  Cardiovascular: Normal rate and regular rhythm.  Exam reveals no gallop and no friction rub.   No murmur heard. Pulmonary/Chest: Effort normal and breath sounds normal. She has no wheezes. She has no rales. She exhibits no tenderness.  Abdominal: Soft.  She exhibits no distension. There is tenderness. There is no rebound and no guarding.  RUQ tenderness to palpation. No other focal tenderness or peritoneal signs.   Musculoskeletal: Normal range of motion.  Neurological: She is alert and oriented to person, place, and time. Coordination normal.  Speech is goal-oriented. Moves limbs without ataxia.   Skin: Skin is warm and dry.  Psychiatric: She has a normal mood and affect. Her behavior is normal.  Nursing note and vitals reviewed.   ED Course  Procedures (including critical care time) Labs Review Labs Reviewed  COMPREHENSIVE METABOLIC PANEL - Abnormal; Notable for the following:    CO2 20 (*)    Glucose, Bld 105 (*)    BUN <5 (*)    All other components within normal limits  CBC - Abnormal; Notable for the following:    WBC 14.4 (*)    All other components within normal limits  URINALYSIS, ROUTINE W REFLEX MICROSCOPIC (NOT AT [facility]) - Abnormal; Notable for the following:    Hgb urine dipstick LARGE (*)    All other components within normal limits  LIPASE, BLOOD  URINE MICROSCOPIC-ADD ON  I-STAT BETA HCG BLOOD, ED ([facility], [facility], [facility] ONLY)    Imaging Review [place] Abdomen Complete  07/11/2015   CLINICAL DATA:  Right upper quadrant pain onset 2 weeks ago.  EXAM: ULTRASOUND ABDOMEN COMPLETE  COMPARISON:  CT abdomen and pelvis 07/03/2015  FINDINGS: Gallbladder: No gallstones or wall thickening visualized. No sonographic Murphy sign noted.  Common bile duct: Diameter: 2 mm, normal  Liver: Diffusely increased hepatic parenchymal echotexture consistent with fatty infiltration.  IVC: Limited visualization due to bowel gas.  Pancreas: Limited visualization due to bowel gas.  Spleen: Size and appearance within normal limits.  Right Kidney: Length: 12.9 cm. Echogenicity within normal limits. No mass or hydronephrosis visualized.  Left Kidney: Length: 13.5 cm. Echogenicity within normal limits. No mass or hydronephrosis visualized.  Abdominal aorta: No  aneurysm visualized.  Other findings: None.  IMPRESSION: Diffuse fatty infiltration of the liver. No other acute abnormalities demonstrated.   Electronically Signed   By: Burman NievesWilliam  Stevens M.D.   On: 07/11/2015 00:47     EKG Interpretation None      MDM   Final diagnoses:  RUQ pain  Non-intractable vomiting with nausea, vomiting of unspecified type    10:35 PM Labs pending. Vitals stable and patient afebrile.   2:04 AM Labs show no acute changes. Hemoglobin in urine due to menses. US shows no acute changes to attribute patient's symptoms. Patient will have GI and PCP follow up. Patient will be discharged with Percocet, phenergan and bentyl. Patient stable for discharge.     Stacie BeckKaitlyn Kella Splinter, PA-C 07/11/15 0205  Gerhard Munchobert Lockwood, MD 07/11/15 1721

## 2015-07-10 NOTE — ED Notes (Signed)
Patient here with complaint of right lateral intermittent abdominal pain with onset 2 weeks ago. States that she was seen for the same here last week sometime and was [facility] She completed that follow-up and was cleared by OB per patient. Presents tonight because pain is continued. States she still cannot eat solid food, but is tolerating fluids. States eating instigates vomiting.

## 2015-07-11 ENCOUNTER — Emergency Department (HOSPITAL_COMMUNITY): Payer: Self-pay

## 2015-07-11 MED ORDER — HYDROMORPHONE HCL 1 MG/ML IJ SOLN
1.0000 mg | Freq: Once | INTRAMUSCULAR | Status: AC
  Administered 2015-07-11: 1 mg via INTRAVENOUS
  Filled 2015-07-11: qty 1

## 2015-07-11 MED ORDER — PROMETHAZINE HCL 25 MG/ML IJ SOLN
25.0000 mg | Freq: Once | INTRAMUSCULAR | Status: AC
  Administered 2015-07-11: 25 mg via INTRAVENOUS
  Filled 2015-07-11: qty 1

## 2015-07-11 MED ORDER — DICYCLOMINE HCL 20 MG PO TABS: 20.0000 mg | ORAL_TABLET | Freq: Two times a day (BID) | ORAL | Status: DC

## 2015-07-11 MED ORDER — OXYCODONE-ACETAMINOPHEN 5-325 MG PO TABS: 2.0000 | ORAL_TABLET | ORAL | Status: DC | PRN

## 2015-07-11 MED ORDER — PROMETHAZINE HCL 25 MG PO TABS: 25.0000 mg | ORAL_TABLET | Freq: Four times a day (QID) | ORAL | Status: DC | PRN

## 2015-07-11 NOTE — Discharge Instructions (Signed)
Take Percocet and Bentyl as needed for abdominal pain. Take phenergan as needed for nausea. Refer to attached documents for more information. [facility] with [facility] for further [facility] of your symptoms. Return to the ED with worsening or concerning symptoms.

## 2015-07-11 NOTE — ED Notes (Signed)
Pt reports nausea unchanged after zofran. Spoke with Dr. Jeraldine LootsLockwood who ordered phenergan for patient. Patient advised that med has to come from pharmacy so it may be a few minutes before we can administer it to her.

## 2015-07-16 ENCOUNTER — Encounter: Payer: Self-pay | Admitting: Gastroenterology

## 2015-08-02 ENCOUNTER — Ambulatory Visit: Payer: Self-pay | Admitting: Gastroenterology

## 2015-08-06 ENCOUNTER — Other Ambulatory Visit (HOSPITAL_COMMUNITY): Payer: Self-pay | Admitting: Physician Assistant

## 2015-08-06 ENCOUNTER — Other Ambulatory Visit: Payer: Self-pay | Admitting: Physician Assistant

## 2015-08-06 DIAGNOSIS — R1011 Right upper quadrant pain: Secondary | ICD-10-CM

## 2015-08-06 DIAGNOSIS — R1310 Dysphagia, unspecified: Secondary | ICD-10-CM

## 2015-08-09 ENCOUNTER — Ambulatory Visit
Admission: RE | Admit: 2015-08-09 | Discharge: 2015-08-09 | Disposition: A | Discharge status: Home / Self Care | Payer: No Typology Code available for payment source | Source: Ambulatory Visit | Attending: Physician Assistant | Admitting: Physician Assistant

## 2015-08-09 DIAGNOSIS — R1310 Dysphagia, unspecified: Secondary | ICD-10-CM

## 2015-08-17 ENCOUNTER — Ambulatory Visit (HOSPITAL_COMMUNITY)
Admission: RE | Admit: 2015-08-17 | Discharge: 2015-08-17 | Disposition: A | Discharge status: Home / Self Care | Payer: MEDICAID | Source: Ambulatory Visit | Attending: Physician Assistant | Admitting: Physician Assistant

## 2015-08-17 DIAGNOSIS — R1011 Right upper quadrant pain: Secondary | ICD-10-CM | POA: Insufficient documentation

## 2015-08-17 DIAGNOSIS — R112 Nausea with vomiting, unspecified: Secondary | ICD-10-CM | POA: Insufficient documentation

## 2015-08-17 DIAGNOSIS — R1031 Right lower quadrant pain: Secondary | ICD-10-CM | POA: Insufficient documentation

## 2015-08-17 MED ORDER — SINCALIDE 5 MCG IJ SOLR
0.0200 ug/kg | Freq: Once | INTRAMUSCULAR | Status: AC
  Administered 2015-08-17: 2.09 ug via INTRAVENOUS

## 2015-08-17 MED ORDER — TECHNETIUM TC 99M MEBROFENIN IV KIT
5.0000 | PACK | Freq: Once | INTRAVENOUS | Status: DC | PRN
  Administered 2015-08-17: 5 via INTRAVENOUS
  Filled 2015-08-17: qty 6

## 2015-08-17 MED ORDER — SINCALIDE 5 MCG IJ SOLR
INTRAMUSCULAR | Status: AC
  Administered 2015-08-17: 2.09 ug via INTRAVENOUS
  Filled 2015-08-17: qty 10

## 2015-10-18 ENCOUNTER — Emergency Department (HOSPITAL_COMMUNITY)
Admission: EM | Admit: 2015-10-18 | Discharge: 2015-10-18 | Disposition: A | Discharge status: Home / Self Care | Payer: Self-pay | Source: Home / Self Care | Attending: Family Medicine | Admitting: Family Medicine

## 2015-10-18 ENCOUNTER — Encounter (HOSPITAL_COMMUNITY): Payer: Self-pay | Admitting: Emergency Medicine

## 2015-10-18 DIAGNOSIS — H0260 Xanthelasma of unspecified eye, unspecified eyelid: Secondary | ICD-10-CM

## 2015-10-18 DIAGNOSIS — R059 Cough, unspecified: Secondary | ICD-10-CM

## 2015-10-18 DIAGNOSIS — R05 Cough: Secondary | ICD-10-CM

## 2015-10-18 NOTE — ED Notes (Signed)
C/o yellowness in right eye since Friday States she used some massacre on Friday  Denies any drainage or itchiness

## 2015-10-18 NOTE — Discharge Instructions (Signed)
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 There is a good chance that this will go away if you can lower your cholesterol. The best way of doing this is through daily mild to moderate exercise and diet control. Please see further medical attention through a primary care physician's office such as [facility], [facility], or [facility] in family medicine. Please use Zyrtec 10 mg every night before bed and use nasal saline multiple times per day to clear your nasal passages. This will help significantly with your cough.

## 2015-10-18 NOTE — ED Provider Notes (Signed)
CSN: [id number]     Arrival date & time 10/18/15  1422 History   First MD Initiated Contact with Patient 10/18/15 1444     Chief Complaint  Patient presents with  . Eye Problem   (Consider location/radiation/quality/duration/timing/severity/associated sxs/prior Treatment) HPI  Right eye lesion. Upper medial eyelid area. Yellow. Started approximately 1-2 days ago. Patient states she looked undergoing found out that these can be due to anything from high cholesterol to cancer 2 heart disease and heart attack 2 numerous other possible diagnoses. Patient states that the problem is constant but unchanged since onset. Left eyelid unaffected. Problem is nonradiating. Has not tried anything for her symptoms.  Patient also complaining of a mild dry cough. On and off for several months. No sputum. Occasional allergy kind of symptoms described.     Past Medical History  Diagnosis Date  . Depression   . Kidney stone    Past Surgical History  Procedure Laterality Date  . Tubal ligation    . Lithotripsy     History reviewed. No pertinent family history. Social History  Substance Use Topics  . Smoking status: Current Every Day Smoker    Types: Cigarettes  . Smokeless tobacco: None  . Alcohol Use: Yes     Comment: occasionally   OB History    No data available     Review of Systems Per HPI with all other pertinent systems negative.   Allergies  Review of patient's allergies indicates no known allergies.  Home Medications   Prior to Admission medications   Medication Sig Start Date End Date Taking? Authorizing Provider  clonazePAM (KLONOPIN) 2 MG tablet Take 2 mg by mouth at bedtime.    Historical Provider, MD  dicyclomine (BENTYL) 20 MG tablet Take 1 tablet (20 mg total) by mouth 2 (two) times daily. 07/11/15   Emilia BeckKaitlyn Szekalski, PA-C  HYDROcodone-acetaminophen (NORCO/VICODIN) 5-325 MG per tablet Take 1-2 tablets by mouth every 6 (six) hours as needed. Patient taking differently:  Take 1-2 tablets by mouth every 6 (six) hours as needed for moderate pain.  07/03/15   Tery SanfilippoMatthew Riester, MD  lithium carbonate 300 MG capsule Take 1,200 mg by mouth at bedtime.     Historical Provider, MD  naproxen sodium (ANAPROX) 220 MG tablet Take 440 mg by mouth 2 (two) times daily as needed (pain).    Historical Provider, MD  oxyCODONE-acetaminophen (PERCOCET/ROXICET) 5-325 MG per tablet Take 2 tablets by mouth every 4 (four) hours as needed for severe pain. 07/11/15   Kaitlyn Szekalski, PA-C  promethazine (PHENERGAN) 25 MG tablet Take 1 tablet (25 mg total) by mouth every 6 (six) hours as needed for nausea or vomiting. 07/11/15   Emilia BeckKaitlyn Szekalski, PA-C  topiramate (TOPAMAX) 25 MG tablet Take 100 mg by mouth daily.     Historical Provider, MD   Meds Ordered and Administered this Visit  Medications - No data to display  BP 106/74 mmHg  Pulse 84  Temp(Src) 99.1 F (37.3 C) (Oral)  Resp 18  SpO2 99% No data found.   Physical Exam Physical Exam  Constitutional: oriented to person, place, and time. appears well-developed and well-nourished. No distress.  HENT:  Head: Normocephalic and atraumatic.  Eyes: EOMI. PERRL.  Neck: Normal range of motion.  Cardiovascular: RRR, no m/r/g, 2+ distal pulses,  Pulmonary/Chest: Effort normal and breath sounds normal. No respiratory distress.  Abdominal: Soft. Bowel sounds are normal. NonTTP, no distension.  Musculoskeletal: Normal range of motion. Non ttp, no effusion.  Neurological: alert and  oriented to person, place, and time.  Skin: yellowish 1x0.5 cm depositions just below the surface of the skin near the upper medaial eyelids.  Psychiatric: normal mood and affect. behavior is normal. Judgment and thought content normal.   ED Course  Procedures (including critical care time)  Labs Review Labs Reviewed - No data to display  Imaging Review No results found.   Visual Acuity Review  Right Eye Distance:   Left Eye Distance:   Bilateral  Distance:    Right Eye Near:   Left Eye Near:    Bilateral Near:         MDM   1. Cough   2. Xanthelasma of eyelid, unspecified laterality    Reasurrance given to pt. Counseled pt to lose wt and change diet. Pt no longer on HLD medications and counseled to f/u w/ a new pcp for liver testing and to restart Statin.  Cough likely allergic and from PND. Start zyrtec adn nasal saline.      Ozella Rocks, MD 10/18/15 [phone]

## 2015-11-27 ENCOUNTER — Encounter (HOSPITAL_COMMUNITY): Payer: Self-pay | Admitting: *Deleted

## 2015-11-27 ENCOUNTER — Emergency Department (HOSPITAL_COMMUNITY): Payer: Medicaid Other

## 2015-11-27 ENCOUNTER — Emergency Department (HOSPITAL_COMMUNITY)
Admission: EM | Admit: 2015-11-27 | Discharge: 2015-11-28 | Disposition: A | Discharge status: Home / Self Care | Payer: Medicaid Other | Attending: Emergency Medicine | Admitting: Emergency Medicine

## 2015-11-27 DIAGNOSIS — Z79899 Other long term (current) drug therapy: Secondary | ICD-10-CM | POA: Diagnosis not present

## 2015-11-27 DIAGNOSIS — M25512 Pain in left shoulder: Secondary | ICD-10-CM | POA: Diagnosis not present

## 2015-11-27 DIAGNOSIS — Z87442 Personal history of urinary calculi: Secondary | ICD-10-CM | POA: Insufficient documentation

## 2015-11-27 DIAGNOSIS — R079 Chest pain, unspecified: Secondary | ICD-10-CM | POA: Diagnosis present

## 2015-11-27 DIAGNOSIS — F17210 Nicotine dependence, cigarettes, uncomplicated: Secondary | ICD-10-CM | POA: Insufficient documentation

## 2015-11-27 LAB — BASIC METABOLIC PANEL
Anion gap: 13 (ref 5–15)
BUN: 12 mg/dL (ref 6–20)
CHLORIDE: 105 mmol/L (ref 101–111)
CO2: 22 mmol/L (ref 22–32)
CREATININE: 0.99 mg/dL (ref 0.44–1.00)
Calcium: 9.7 mg/dL (ref 8.9–10.3)
GFR calc Af Amer: >60 mL/min (ref >60)
GFR calc non Af Amer: >60 mL/min (ref >60)
Glucose, Bld: 112 mg/dL — ABNORMAL HIGH (ref 65–99)
Potassium: 4 mmol/L (ref 3.5–5.1)
SODIUM: 140 mmol/L (ref 135–145)

## 2015-11-27 LAB — CBC
HCT: 49.1 % — ABNORMAL HIGH (ref 36.0–46.0)
Hemoglobin: 16.2 g/dL — ABNORMAL HIGH (ref 12.0–15.0)
MCH: 32.8 pg (ref 26.0–34.0)
MCHC: 33 g/dL (ref 30.0–36.0)
MCV: 99.4 fL (ref 78.0–100.0)
PLATELETS: 395 10*3/uL (ref 150–400)
RBC: 4.94 MIL/uL (ref 3.87–5.11)
RDW: 13 % (ref 11.5–15.5)
WBC: 16.3 10*3/uL — AB (ref 4.0–10.5)

## 2015-11-27 LAB — DIFFERENTIAL
Basophils Absolute: 0 10*3/uL (ref 0.0–0.1)
Basophils Relative: 0 %
EOS ABS: 0.4 10*3/uL (ref 0.0–0.7)
EOS PCT: 3 %
LYMPHS ABS: 4.4 10*3/uL — AB (ref 0.7–4.0)
Lymphocytes Relative: 27 %
MONOS PCT: 4 %
Monocytes Absolute: 0.6 10*3/uL (ref 0.1–1.0)
NEUTROS PCT: 66 %
Neutro Abs: 10.8 10*3/uL — ABNORMAL HIGH (ref 1.7–7.7)

## 2015-11-27 LAB — I-STAT TROPONIN, ED: Troponin i, poc: 0 ng/mL (ref 0.00–0.08)

## 2015-11-27 MED ORDER — ONDANSETRON 4 MG PO TBDP
4.0000 mg | ORAL_TABLET | Freq: Once | ORAL | Status: AC
  Administered 2015-11-27: 4 mg via ORAL

## 2015-11-27 MED ORDER — KETOROLAC TROMETHAMINE 30 MG/ML IJ SOLN
30.0000 mg | Freq: Once | INTRAMUSCULAR | Status: AC
  Administered 2015-11-27: 30 mg via INTRAVENOUS
  Filled 2015-11-27: qty 1

## 2015-11-27 MED ORDER — OXYCODONE-ACETAMINOPHEN 5-325 MG PO TABS
1.0000 | ORAL_TABLET | Freq: Once | ORAL | Status: AC
  Administered 2015-11-27: 1 via ORAL

## 2015-11-27 MED ORDER — DIPHENHYDRAMINE HCL 25 MG PO CAPS
ORAL_CAPSULE | ORAL | Status: AC
  Administered 2015-11-27: 25 mg via ORAL
  Filled 2015-11-27: qty 1

## 2015-11-27 MED ORDER — SODIUM CHLORIDE 0.9 % IV BOLUS (SEPSIS)
1000.0000 mL | Freq: Once | INTRAVENOUS | Status: AC
  Administered 2015-11-27: 1000 mL via INTRAVENOUS

## 2015-11-27 MED ORDER — ONDANSETRON 4 MG PO TBDP
ORAL_TABLET | ORAL | Status: AC
  Administered 2015-11-27: 4 mg via ORAL
  Filled 2015-11-27: qty 1

## 2015-11-27 MED ORDER — DIPHENHYDRAMINE HCL 25 MG PO CAPS
25.0000 mg | ORAL_CAPSULE | Freq: Once | ORAL | Status: AC
  Administered 2015-11-27: 25 mg via ORAL

## 2015-11-27 MED ORDER — OXYCODONE-ACETAMINOPHEN 5-325 MG PO TABS
ORAL_TABLET | ORAL | Status: AC
  Administered 2015-11-27: 1 via ORAL
  Filled 2015-11-27: qty 1

## 2015-11-27 MED ORDER — ACETAMINOPHEN 500 MG PO TABS
1000.0000 mg | ORAL_TABLET | Freq: Once | ORAL | Status: AC
  Administered 2015-11-27: 1000 mg via ORAL
  Filled 2015-11-27: qty 2

## 2015-11-27 NOTE — ED Notes (Signed)
Pt up to nurse first stating she is itching from the percocet she was given in triage.

## 2015-11-27 NOTE — ED Notes (Signed)
Pt in c/o central chest pain that started this morning, has been getting progressively worse throughout the day, pt tearful on arrival, painful to move or take a deep breath, denies history of same

## 2015-11-27 NOTE — Discharge Instructions (Signed)
Nonspecific Chest Pain Ms. Stacie Sanders, your blood work and chest x-ray today were normal. See a primary care physician within 3 days for close follow-up. If any symptoms worsen come back to emergency department immediately. Thank you. It is often hard to find the cause of chest pain. There is always a chance that your pain could be related to something serious, such as a heart attack or a blood clot in your lungs. Chest pain can also be caused by conditions that are not life-threatening. If you have chest pain, it is very important to follow up with your doctor.  HOME CARE  If you were prescribed an antibiotic medicine, finish it all even if you start to feel better.  Avoid any activities that cause chest pain.  Do not use any tobacco products, including cigarettes, chewing tobacco, or electronic cigarettes. If you need help quitting, ask your doctor.  Do not drink alcohol.  Take medicines only as told by your doctor.  Keep all follow-up visits as told by your doctor. This is important. This includes any further testing if your chest pain does not go away.  Your doctor may tell you to keep your head raised (elevated) while you sleep.  Make lifestyle changes as told by your doctor. These may include:  Getting regular exercise. Ask your doctor to suggest some activities that are safe for you.  Eating a heart-healthy diet. Your doctor or a diet specialist (dietitian) can help you to learn healthy eating options.  Maintaining a healthy weight.  Managing diabetes, if necessary.  Reducing stress. GET HELP IF:  Your chest pain does not go away, even after treatment.  You have a rash with blisters on your chest.  You have a fever. GET HELP RIGHT AWAY IF:  Your chest pain is worse.  You have an increasing cough, or you cough up blood.  You have severe belly (abdominal) pain.  You feel extremely weak.  You pass out (faint).  You have chills.  You have sudden, unexplained chest  discomfort.  You have sudden, unexplained discomfort in your arms, back, neck, or jaw.  You have shortness of breath at any time.  You suddenly start to sweat, or your skin gets clammy.  You feel nauseous.  You vomit.  You suddenly feel light-headed or dizzy.  Your heart begins to beat quickly, or it feels like it is skipping beats. These symptoms may be an emergency. Do not wait to see if the symptoms will go away. Get medical help right away. Call your local emergency services (911 in the [place] Do not drive yourself to the hospital.   This information is not intended to replace advice given to you by your health care provider. Make sure you discuss any questions you have with your health care provider.   Document Released: 06/02/2008 Document Revised: 01/05/2015 Document Reviewed: 07/21/2014 Else[employer] Interactive Patient Education [employer]

## 2015-11-27 NOTE — ED Provider Notes (Signed)
CSN: [id number]     Arrival date & time 11/27/15  1913 History   By signing my name below, I, Freida Busmaniana Omoyeni, attest that this documentation has been prepared under the direction and in the presence of Tomasita CrumbleAdeleke Sahith Nurse, MD . Electronically Signed: Freida Busmaniana Omoyeni, Scribe. 11/28/2015. 12:35 AM.   Chief Complaint  Patient presents with  . Chest Pain   The history is provided by the patient. No language interpreter was used.   HPI Comments:  Stacie Sanders is a 44 y.o. female who presents to the Emergency Department complaining of 10/10 squeezing central CP that began this AM. Pt notes pain occasionally radiates across chest. Her pain is exacerbated with movement and deep inspiration. She denies diaphoresis and  vomiting. She also notes pain to her left shoulder for a few days. Pt denies heavy lifting/recent fall. She also denies recent travel/long periods of immobilization, recent surgery, and h/o blood clots. No rhinorrhea cough congestion. No alleviating factors noted.  No PCP  Past Medical History  Diagnosis Date  . Depression   . Kidney stone    Past Surgical History  Procedure Laterality Date  . Tubal ligation    . Lithotripsy     History reviewed. No pertinent family history. Social History  Substance Use Topics  . Smoking status: Current Every Day Smoker    Types: Cigarettes  . Smokeless tobacco: None  . Alcohol Use: Yes     Comment: occasionally   OB History    No data available     Review of Systems  10 systems reviewed and all are negative for acute change except as noted in the HPI.    Allergies  Review of patient's allergies indicates no known allergies.  Home Medications   Prior to Admission medications   Medication Sig Start Date End Date Taking? Authorizing Provider  clonazePAM (KLONOPIN) 2 MG tablet Take 2 mg by mouth at bedtime.    Historical Provider, MD  dicyclomine (BENTYL) 20 MG tablet Take 1 tablet (20 mg total) by mouth 2 (two) times daily. 07/11/15    Emilia BeckKaitlyn Szekalski, PA-C  HYDROcodone-acetaminophen (NORCO/VICODIN) 5-325 MG per tablet Take 1-2 tablets by mouth every 6 (six) hours as needed. Patient taking differently: Take 1-2 tablets by mouth every 6 (six) hours as needed for moderate pain.  07/03/15   Tery SanfilippoMatthew Riester, MD  lithium carbonate 300 MG capsule Take 1,200 mg by mouth at bedtime.     Historical Provider, MD  naproxen sodium (ANAPROX) 220 MG tablet Take 440 mg by mouth 2 (two) times daily as needed (pain).    Historical Provider, MD  oxyCODONE-acetaminophen (PERCOCET/ROXICET) 5-325 MG per tablet Take 2 tablets by mouth every 4 (four) hours as needed for severe pain. 07/11/15   Kaitlyn Szekalski, PA-C  promethazine (PHENERGAN) 25 MG tablet Take 1 tablet (25 mg total) by mouth every 6 (six) hours as needed for nausea or vomiting. 07/11/15   Emilia BeckKaitlyn Szekalski, PA-C  topiramate (TOPAMAX) 25 MG tablet Take 100 mg by mouth daily.     Historical Provider, MD   BP 116/82 mmHg  Pulse 76  Temp(Src) 98.6 F (37 C) (Oral)  Resp 19  Wt 235 lb (106.595 kg)  SpO2 97%  LMP 11/21/2015 Physical Exam  Constitutional: She is oriented to person, place, and time. She appears well-developed and well-nourished. No distress.  HENT:  Head: Normocephalic and atraumatic.  Nose: Nose normal.  Mouth/Throat: Oropharynx is clear and moist. No oropharyngeal exudate.  Eyes: Conjunctivae and EOM are normal.  Pupils are equal, round, and reactive to light. No scleral icterus.  Neck: Normal range of motion. Neck supple. No JVD present. No tracheal deviation present. No thyromegaly present.  Cardiovascular: Normal rate, regular rhythm and normal heart sounds.  Exam reveals no gallop and no friction rub.   No murmur heard. Pulmonary/Chest: Effort normal and breath sounds normal. No respiratory distress. She has no wheezes. She exhibits no tenderness.  Abdominal: Soft. Bowel sounds are normal. She exhibits no distension and no mass. There is no tenderness. There is no  rebound and no guarding.  Musculoskeletal: Normal range of motion. She exhibits no edema or tenderness.  Lymphadenopathy:    She has no cervical adenopathy.  Neurological: She is alert and oriented to person, place, and time. No cranial nerve deficit. She exhibits normal muscle tone.  Skin: Skin is warm and dry. No rash noted. No erythema. No pallor.  Nursing note and vitals reviewed.   ED Course  Procedures   DIAGNOSTIC STUDIES:  Oxygen Saturation is 99% on RA, normal by my interpretation.    COORDINATION OF CARE:  11:22 PM Pt updated with partial results. Will order more bloodwork. Discussed treatment plan with pt at bedside and pt agreed to plan.  Labs Review Labs Reviewed  BASIC METABOLIC PANEL - Abnormal; Notable for the following:    Glucose, Bld 112 (*)    All other components within normal limits  CBC - Abnormal; Notable for the following:    WBC 16.3 (*)    Hemoglobin 16.2 (*)    HCT 49.1 (*)    All other components within normal limits  DIFFERENTIAL - Abnormal; Notable for the following:    Neutro Abs 10.8 (*)    Lymphs Abs 4.4 (*)    All other components within normal limits  I-STAT TROPOININ, EDRosezena SensorOININ, ED    Imaging Review Dg Chest 2 View  11/27/2015  CLINICAL DATA:  Chest pain EXAM: CHEST  2 VIEW COMPARISON:  06/15/2008 FINDINGS: Lungs are under aerated and grossly clear. No pleural effusion or pneumothorax. Normal heart size. IMPRESSION: No active cardiopulmonary disease. Electronically Signed   By: Jolaine Click M.D.   On: 11/27/2015 20:54   I have personally reviewed and evaluated these images and lab results as part of my medical decision-making.   EKG Interpretation   Date/Time:  Wednesday November 28 2015 00:51:58 EST Ventricular Rate:  75 PR Interval:  160 QRS Duration: 78 QT Interval:  422 QTC Calculation: 471 R Axis:   47 Text Interpretation:  Sinus rhythm Borderline ST elevation, inferior leads  No significant change since last  tracing Confirmed by Erroll Luna [phone]) on 11/28/2015 1:20:46 AM      MDM   Final diagnoses:  Chest pain, unspecified chest pain type    Patient presents to the emergency department for chest pain. Her history is not consistent with ACS. She is PERC negative. Her heart score is currently 1 due to nonspecific EKG findings. We'll hold the patient for 3 hours to repeat troponin and EKG. She was given Toradol and Tylenol for pain control.  Patient continues to have pain however repeat EKG and troponin were normal.  CXR was normal as well.  She continues to appears well in the room and in NAD.  VS remain within her normal limits and she is safe for DC.   I personally performed the services described in this documentation, which was scribed in my presence. The recorded information has been reviewed  and is accurate.     Tomasita Crumble, MD 11/28/15 [phone]

## 2015-11-28 LAB — I-STAT TROPONIN, ED: Troponin i, poc: 0 ng/mL (ref 0.00–0.08)

## 2015-11-28 NOTE — ED Notes (Signed)
MD notified of pain increase

## 2015-12-29 ENCOUNTER — Emergency Department (HOSPITAL_COMMUNITY): Payer: Medicaid Other

## 2015-12-29 ENCOUNTER — Inpatient Hospital Stay (HOSPITAL_COMMUNITY)
Admission: EM | Admit: 2015-12-29 | Discharge: 2016-01-13 | DRG: 917 | Disposition: A | Discharge status: Home / Self Care | Payer: Medicaid Other | Attending: Internal Medicine | Admitting: Internal Medicine

## 2015-12-29 ENCOUNTER — Inpatient Hospital Stay (HOSPITAL_COMMUNITY): Payer: Medicaid Other

## 2015-12-29 ENCOUNTER — Encounter (HOSPITAL_COMMUNITY): Payer: Self-pay | Admitting: Emergency Medicine

## 2015-12-29 DIAGNOSIS — J9601 Acute respiratory failure with hypoxia: Secondary | ICD-10-CM | POA: Diagnosis not present

## 2015-12-29 DIAGNOSIS — Z885 Allergy status to narcotic agent status: Secondary | ICD-10-CM

## 2015-12-29 DIAGNOSIS — F17210 Nicotine dependence, cigarettes, uncomplicated: Secondary | ICD-10-CM | POA: Diagnosis present

## 2015-12-29 DIAGNOSIS — J690 Pneumonitis due to inhalation of food and vomit: Secondary | ICD-10-CM | POA: Diagnosis present

## 2015-12-29 DIAGNOSIS — F319 Bipolar disorder, unspecified: Secondary | ICD-10-CM | POA: Diagnosis present

## 2015-12-29 DIAGNOSIS — R502 Drug induced fever: Secondary | ICD-10-CM | POA: Diagnosis not present

## 2015-12-29 DIAGNOSIS — T50905A Adverse effect of unspecified drugs, medicaments and biological substances, initial encounter: Secondary | ICD-10-CM | POA: Diagnosis present

## 2015-12-29 DIAGNOSIS — T424X2A Poisoning by benzodiazepines, intentional self-harm, initial encounter: Secondary | ICD-10-CM | POA: Diagnosis present

## 2015-12-29 DIAGNOSIS — G40901 Epilepsy, unspecified, not intractable, with status epilepticus: Secondary | ICD-10-CM | POA: Diagnosis present

## 2015-12-29 DIAGNOSIS — T50901A Poisoning by unspecified drugs, medicaments and biological substances, accidental (unintentional), initial encounter: Secondary | ICD-10-CM | POA: Diagnosis present

## 2015-12-29 DIAGNOSIS — F10231 Alcohol dependence with withdrawal delirium: Secondary | ICD-10-CM | POA: Diagnosis present

## 2015-12-29 DIAGNOSIS — A419 Sepsis, unspecified organism: Secondary | ICD-10-CM | POA: Diagnosis not present

## 2015-12-29 DIAGNOSIS — Z915 Personal history of self-harm: Secondary | ICD-10-CM

## 2015-12-29 DIAGNOSIS — E872 Acidosis: Secondary | ICD-10-CM | POA: Diagnosis present

## 2015-12-29 DIAGNOSIS — N390 Urinary tract infection, site not specified: Secondary | ICD-10-CM | POA: Diagnosis present

## 2015-12-29 DIAGNOSIS — J9690 Respiratory failure, unspecified, unspecified whether with hypoxia or hypercapnia: Secondary | ICD-10-CM

## 2015-12-29 DIAGNOSIS — E876 Hypokalemia: Secondary | ICD-10-CM | POA: Diagnosis present

## 2015-12-29 DIAGNOSIS — K567 Ileus, unspecified: Secondary | ICD-10-CM

## 2015-12-29 DIAGNOSIS — M79622 Pain in left upper arm: Secondary | ICD-10-CM | POA: Diagnosis not present

## 2015-12-29 DIAGNOSIS — R739 Hyperglycemia, unspecified: Secondary | ICD-10-CM | POA: Diagnosis present

## 2015-12-29 DIAGNOSIS — Z9119 Patient's noncompliance with other medical treatment and regimen: Secondary | ICD-10-CM

## 2015-12-29 DIAGNOSIS — T50902A Poisoning by unspecified drugs, medicaments and biological substances, intentional self-harm, initial encounter: Secondary | ICD-10-CM

## 2015-12-29 DIAGNOSIS — R061 Stridor: Secondary | ICD-10-CM | POA: Diagnosis not present

## 2015-12-29 DIAGNOSIS — G4700 Insomnia, unspecified: Secondary | ICD-10-CM | POA: Diagnosis present

## 2015-12-29 DIAGNOSIS — R45851 Suicidal ideations: Secondary | ICD-10-CM | POA: Diagnosis present

## 2015-12-29 DIAGNOSIS — G9340 Encephalopathy, unspecified: Secondary | ICD-10-CM | POA: Insufficient documentation

## 2015-12-29 DIAGNOSIS — R319 Hematuria, unspecified: Secondary | ICD-10-CM | POA: Diagnosis not present

## 2015-12-29 DIAGNOSIS — M6282 Rhabdomyolysis: Secondary | ICD-10-CM | POA: Diagnosis not present

## 2015-12-29 DIAGNOSIS — Z79899 Other long term (current) drug therapy: Secondary | ICD-10-CM | POA: Diagnosis not present

## 2015-12-29 DIAGNOSIS — G92 Toxic encephalopathy: Secondary | ICD-10-CM | POA: Diagnosis present

## 2015-12-29 DIAGNOSIS — R509 Fever, unspecified: Secondary | ICD-10-CM | POA: Diagnosis not present

## 2015-12-29 DIAGNOSIS — R1310 Dysphagia, unspecified: Secondary | ICD-10-CM | POA: Diagnosis present

## 2015-12-29 DIAGNOSIS — T50902D Poisoning by unspecified drugs, medicaments and biological substances, intentional self-harm, subsequent encounter: Secondary | ICD-10-CM | POA: Diagnosis not present

## 2015-12-29 DIAGNOSIS — R569 Unspecified convulsions: Secondary | ICD-10-CM | POA: Diagnosis not present

## 2015-12-29 DIAGNOSIS — Z4659 Encounter for fitting and adjustment of other gastrointestinal appliance and device: Secondary | ICD-10-CM

## 2015-12-29 DIAGNOSIS — K219 Gastro-esophageal reflux disease without esophagitis: Secondary | ICD-10-CM | POA: Diagnosis present

## 2015-12-29 DIAGNOSIS — T50904S Poisoning by unspecified drugs, medicaments and biological substances, undetermined, sequela: Secondary | ICD-10-CM | POA: Diagnosis not present

## 2015-12-29 DIAGNOSIS — T796XXA Traumatic ischemia of muscle, initial encounter: Secondary | ICD-10-CM | POA: Diagnosis not present

## 2015-12-29 DIAGNOSIS — N342 Other urethritis: Secondary | ICD-10-CM | POA: Diagnosis not present

## 2015-12-29 DIAGNOSIS — E870 Hyperosmolality and hypernatremia: Secondary | ICD-10-CM | POA: Diagnosis present

## 2015-12-29 DIAGNOSIS — J9600 Acute respiratory failure, unspecified whether with hypoxia or hypercapnia: Secondary | ICD-10-CM

## 2015-12-29 LAB — CBC WITH DIFFERENTIAL/PLATELET
Basophils Absolute: 0 10*3/uL (ref 0.0–0.1)
Basophils Relative: 0 %
Eosinophils Absolute: 0.5 10*3/uL (ref 0.0–0.7)
Eosinophils Relative: 3 %
HEMATOCRIT: 47.7 % — AB (ref 36.0–46.0)
HEMOGLOBIN: 15.6 g/dL — AB (ref 12.0–15.0)
LYMPHS ABS: 5.5 10*3/uL — AB (ref 0.7–4.0)
Lymphocytes Relative: 36 %
MCH: 32.3 pg (ref 26.0–34.0)
MCHC: 32.7 g/dL (ref 30.0–36.0)
MCV: 98.8 fL (ref 78.0–100.0)
MONOS PCT: 4 %
Monocytes Absolute: 0.6 10*3/uL (ref 0.1–1.0)
NEUTROS ABS: 8.7 10*3/uL — AB (ref 1.7–7.7)
NEUTROS PCT: 57 %
Platelets: 354 10*3/uL (ref 150–400)
RBC: 4.83 MIL/uL (ref 3.87–5.11)
RDW: 13.2 % (ref 11.5–15.5)
WBC: 15.3 10*3/uL — ABNORMAL HIGH (ref 4.0–10.5)

## 2015-12-29 LAB — I-STAT CHEM 8, ED
BUN: 4 mg/dL — ABNORMAL LOW (ref 6–20)
CHLORIDE: 107 mmol/L (ref 101–111)
CREATININE: 0.9 mg/dL (ref 0.44–1.00)
Calcium, Ion: 1.15 mmol/L (ref 1.12–1.23)
GLUCOSE: 114 mg/dL — AB (ref 65–99)
HCT: 52 % — ABNORMAL HIGH (ref 36.0–46.0)
HEMOGLOBIN: 17.7 g/dL — AB (ref 12.0–15.0)
POTASSIUM: 3.8 mmol/L (ref 3.5–5.1)
Sodium: 145 mmol/L (ref 135–145)
TCO2: 25 mmol/L (ref 0–100)

## 2015-12-29 LAB — BLOOD GAS, ARTERIAL
ACID-BASE DEFICIT: 6.3 mmol/L — AB (ref 0.0–2.0)
Acid-base deficit: 5.4 mmol/L — ABNORMAL HIGH (ref 0.0–2.0)
Bicarbonate: 19.9 mEq/L — ABNORMAL LOW (ref 20.0–24.0)
Bicarbonate: 21.9 mEq/L (ref 20.0–24.0)
DRAWN BY: 422461
DRAWN BY: 422461
FIO2: 1
MECHVT: 450 mL
O2 CONTENT: 2 L/min
O2 SAT: 96.1 %
O2 Saturation: 98.7 %
PATIENT TEMPERATURE: 98.6
PCO2 ART: 50.6 mmHg — AB (ref 35.0–45.0)
PEEP: 5 cmH2O
PH ART: 7.259 — AB (ref 7.350–7.450)
PO2 ART: 182 mmHg — AB (ref 80.0–100.0)
Patient temperature: 36.9
RATE: 20 resp/min
TCO2: 17.9 mmol/L (ref 0–100)
TCO2: 19.5 mmol/L (ref 0–100)
pCO2 arterial: 43.9 mmHg (ref 35.0–45.0)
pH, Arterial: 7.279 — ABNORMAL LOW (ref 7.350–7.450)
pO2, Arterial: 93.3 mmHg (ref 80.0–100.0)

## 2015-12-29 LAB — ETHANOL: Alcohol, Ethyl (B): 160 mg/dL — ABNORMAL HIGH (ref <5)

## 2015-12-29 LAB — HEPATIC FUNCTION PANEL
ALT: 52 U/L (ref 14–54)
AST: 41 U/L (ref 15–41)
Albumin: 4.1 g/dL (ref 3.5–5.0)
Alkaline Phosphatase: 122 U/L (ref 38–126)
BILIRUBIN DIRECT: 0.2 mg/dL (ref 0.1–0.5)
BILIRUBIN INDIRECT: 0.3 mg/dL (ref 0.3–0.9)
TOTAL PROTEIN: 7.4 g/dL (ref 6.5–8.1)
Total Bilirubin: 0.5 mg/dL (ref 0.3–1.2)

## 2015-12-29 LAB — I-STAT CG4 LACTIC ACID, ED
Lactic Acid, Venous: 2.63 mmol/L (ref 0.5–2.0)
Lactic Acid, Venous: 2.81 mmol/L (ref 0.5–2.0)

## 2015-12-29 LAB — RAPID URINE DRUG SCREEN, HOSP PERFORMED
AMPHETAMINES: NOT DETECTED
BENZODIAZEPINES: POSITIVE — AB
Barbiturates: NOT DETECTED
COCAINE: NOT DETECTED
OPIATES: NOT DETECTED
Tetrahydrocannabinol: NOT DETECTED

## 2015-12-29 LAB — MRSA PCR SCREENING: MRSA by PCR: NEGATIVE

## 2015-12-29 LAB — LACTIC ACID, PLASMA
LACTIC ACID, VENOUS: 2.8 mmol/L — AB (ref 0.5–2.0)
LACTIC ACID, VENOUS: 1.8 mmol/L (ref 0.5–2.0)

## 2015-12-29 LAB — SALICYLATE LEVEL: Salicylate Lvl: <4 mg/dL (ref 2.8–30.0)

## 2015-12-29 LAB — ACETAMINOPHEN LEVEL: Acetaminophen (Tylenol), Serum: <10 ug/mL — ABNORMAL LOW (ref 10–30)

## 2015-12-29 LAB — PROCALCITONIN: Procalcitonin: 0.1 ng/mL

## 2015-12-29 LAB — LITHIUM LEVEL: Lithium Lvl: 0.29 mmol/L — ABNORMAL LOW (ref 0.60–1.20)

## 2015-12-29 MED ORDER — VANCOMYCIN HCL IN DEXTROSE 1-5 GM/200ML-% IV SOLN
1000.0000 mg | Freq: Three times a day (TID) | INTRAVENOUS | Status: DC
  Administered 2015-12-29 – 2015-12-31 (×6): 1000 mg via INTRAVENOUS
  Filled 2015-12-29 (×6): qty 200

## 2015-12-29 MED ORDER — FUROSEMIDE 10 MG/ML IJ SOLN
40.0000 mg | Freq: Once | INTRAMUSCULAR | Status: AC
  Administered 2015-12-29: 40 mg via INTRAVENOUS
  Filled 2015-12-29: qty 4

## 2015-12-29 MED ORDER — ROCURONIUM BROMIDE 50 MG/5ML IV SOLN
1.0000 mg/kg | Freq: Once | INTRAVENOUS | Status: AC
Start: 2015-12-29 — End: 2015-12-29
  Administered 2015-12-29: 100 mg via INTRAVENOUS

## 2015-12-29 MED ORDER — SODIUM CHLORIDE 0.9 % IV SOLN
250.0000 mL | INTRAVENOUS | Status: DC | PRN
  Administered 2015-12-29: 250 mL via INTRAVENOUS

## 2015-12-29 MED ORDER — ACETAMINOPHEN 160 MG/5ML PO SOLN
650.0000 mg | Freq: Four times a day (QID) | ORAL | Status: DC | PRN
  Administered 2015-12-29 – 2016-01-05 (×12): 650 mg
  Filled 2015-12-29 (×13): qty 20.3

## 2015-12-29 MED ORDER — VECURONIUM BROMIDE 10 MG IV SOLR
10.0000 mg | Freq: Once | INTRAVENOUS | Status: AC
  Administered 2015-12-29: 10 mg via INTRAVENOUS
  Filled 2015-12-29: qty 10

## 2015-12-29 MED ORDER — SENNOSIDES 8.8 MG/5ML PO SYRP
5.0000 mL | ORAL_SOLUTION | Freq: Two times a day (BID) | ORAL | Status: DC | PRN
  Filled 2015-12-29: qty 5

## 2015-12-29 MED ORDER — ANTISEPTIC ORAL RINSE SOLUTION (CORINZ)
7.0000 mL | Freq: Four times a day (QID) | OROMUCOSAL | Status: DC
  Administered 2015-12-29 – 2016-01-01 (×10): 7 mL via OROMUCOSAL

## 2015-12-29 MED ORDER — SODIUM CHLORIDE 0.9 % IV BOLUS (SEPSIS)
1000.0000 mL | Freq: Once | INTRAVENOUS | Status: AC
  Administered 2015-12-29: 1000 mL via INTRAVENOUS

## 2015-12-29 MED ORDER — CHLORHEXIDINE GLUCONATE 0.12% ORAL RINSE (MEDLINE KIT)
15.0000 mL | Freq: Two times a day (BID) | OROMUCOSAL | Status: DC
  Administered 2015-12-29 – 2016-01-01 (×6): 15 mL via OROMUCOSAL

## 2015-12-29 MED ORDER — LACTATED RINGERS IV SOLN
INTRAVENOUS | Status: DC
  Administered 2015-12-29 – 2015-12-30 (×2): via INTRAVENOUS
  Administered 2015-12-30: 75 mL/h via INTRAVENOUS
  Administered 2015-12-31: 06:00:00 via INTRAVENOUS

## 2015-12-29 MED ORDER — FENTANYL CITRATE (PF) 100 MCG/2ML IJ SOLN
25.0000 ug | INTRAMUSCULAR | Status: DC | PRN
  Administered 2015-12-29 – 2015-12-31 (×11): 50 ug via INTRAVENOUS
  Filled 2015-12-29 (×11): qty 2

## 2015-12-29 MED ORDER — PIPERACILLIN-TAZOBACTAM 3.375 G IVPB
3.3750 g | Freq: Three times a day (TID) | INTRAVENOUS | Status: DC
  Administered 2015-12-29 – 2015-12-31 (×6): 3.375 g via INTRAVENOUS
  Filled 2015-12-29 (×5): qty 50

## 2015-12-29 MED ORDER — PROPOFOL 1000 MG/100ML IV EMUL
0.0000 ug/kg/min | INTRAVENOUS | Status: DC
  Administered 2015-12-29: 25 ug/kg/min via INTRAVENOUS
  Administered 2015-12-29: 15 ug/kg/min via INTRAVENOUS
  Administered 2015-12-29: 40.338 ug/kg/min via INTRAVENOUS
  Administered 2015-12-30: 30 ug/kg/min via INTRAVENOUS
  Administered 2015-12-30: 40 ug/kg/min via INTRAVENOUS
  Administered 2015-12-30 (×2): 35 ug/kg/min via INTRAVENOUS
  Administered 2015-12-30: 30 ug/kg/min via INTRAVENOUS
  Administered 2015-12-30: 35 ug/kg/min via INTRAVENOUS
  Filled 2015-12-29 (×11): qty 100

## 2015-12-29 MED ORDER — FENTANYL CITRATE (PF) 100 MCG/2ML IJ SOLN
100.0000 ug | INTRAMUSCULAR | Status: DC | PRN
  Administered 2015-12-29: 100 ug via INTRAVENOUS
  Filled 2015-12-29: qty 2

## 2015-12-29 MED ORDER — NALOXONE HCL 2 MG/2ML IJ SOSY
2.0000 mg | PREFILLED_SYRINGE | Freq: Once | INTRAMUSCULAR | Status: AC
  Administered 2015-12-29: 2 mg via INTRAVENOUS
  Filled 2015-12-29: qty 2

## 2015-12-29 MED ORDER — FAMOTIDINE 40 MG/5ML PO SUSR: 20.0000 mg | Freq: Two times a day (BID) | ORAL | Status: DC

## 2015-12-29 MED ORDER — ENOXAPARIN SODIUM 40 MG/0.4ML ~~LOC~~ SOLN
40.0000 mg | SUBCUTANEOUS | Status: DC
  Administered 2015-12-29 – 2016-01-13 (×16): 40 mg via SUBCUTANEOUS
  Filled 2015-12-29 (×16): qty 0.4

## 2015-12-29 MED ORDER — STERILE WATER FOR INJECTION IJ SOLN
INTRAMUSCULAR | Status: AC
  Administered 2015-12-29: 03:00:00
  Filled 2015-12-29: qty 10

## 2015-12-29 MED ORDER — RANITIDINE HCL 150 MG/10ML PO SYRP
150.0000 mg | ORAL_SOLUTION | Freq: Two times a day (BID) | ORAL | Status: DC
  Administered 2015-12-29 – 2015-12-31 (×6): 150 mg
  Filled 2015-12-29 (×9): qty 10

## 2015-12-29 MED ORDER — PROPOFOL 1000 MG/100ML IV EMUL
5.0000 ug/kg/min | Freq: Once | INTRAVENOUS | Status: AC
  Administered 2015-12-29: 5 ug/kg/min via INTRAVENOUS
  Filled 2015-12-29: qty 100

## 2015-12-29 MED ORDER — ETOMIDATE 2 MG/ML IV SOLN
0.3000 mg/kg | Freq: Once | INTRAVENOUS | Status: AC
Start: 2015-12-29 — End: 2015-12-29
  Administered 2015-12-29: 30 mg via INTRAVENOUS

## 2015-12-29 MED ORDER — ONDANSETRON HCL 4 MG/2ML IJ SOLN
4.0000 mg | Freq: Once | INTRAMUSCULAR | Status: AC
  Administered 2015-12-29: 4 mg via INTRAVENOUS
  Filled 2015-12-29: qty 2

## 2015-12-29 NOTE — ED Notes (Signed)
Poison Control called to get update on patient; also advised patient be monitored at minimum 6 hours past the time of ingestion; MD Palumbo notified

## 2015-12-29 NOTE — ED Notes (Signed)
Per [place], [facility] and will be admitted to a recently opened ICU bed here [facility]

## 2015-12-29 NOTE — ED Notes (Signed)
Bed: RESA Expected date:  Expected time:  Means of arrival:  Comments: 43F 20 Xanax and 12 Klonopin 1 hour ago

## 2015-12-29 NOTE — Progress Notes (Signed)
Patients left ring finger noted to be swollen and red around the knuckle where the patients wedding band was located. I spoke with the patients husband earlier this evening and he gave verbal permission for the ring to be cut off of the patients finger if other attempts to remove it were unsuccessful. Multiple attempts to remove the wedding band were unsuccessful, including lubrication, elevation, ice, and wrapping the finger. The ring was successfully cut off with the ring cutter from the ED with one cut. The patient tolerated the procedure very well with no adverse effects. The ring was placed in a clear plastic bag and a patient sticker was placed on the bag. The bag was taped to the computer in the patients room and will be given to the patients husband when he arrives in the morning. The patients finger is pink and edematous with a dark pink line where the ring was pressing into the skin, but intact. The ring cutter was returned to the ED. Will continue to monitor the patients finger. Bosie HelperS. Mykaylah Ballman, RN

## 2015-12-29 NOTE — ED Notes (Signed)
Per EMS pt took 20 xanax and 12 klonopin ; strength unknown; pt states that she was trying to kill herself; EMS reports her son died and may be reason for drug OD; pt presents with hacking cough and snoring respirations; pt was tachycardic en route; pt with hx bipolar, depression, GERD and panic attacks; pt with alcohol on board; pt denies HI at this time

## 2015-12-29 NOTE — Progress Notes (Signed)
CRITICAL VALUE ALERT  Critical value received:  Lactic 2.8  Date of notification:  12/29/15  Time of notification:  1245  Critical value read back:Yes.    Nurse who received alert:  Harvin HazelMegan Rubylee Zamarripa, RN  MD notified (1st page):  Dr. Jamison NeighborNestor  Time of first page:  1250  MD notified (2nd page): Dr. Jamison NeighborNestor  Time of second page: 1315  Responding MD:  Dr. Jamison NeighborNestor  Time MD responded:  1320

## 2015-12-29 NOTE — Progress Notes (Signed)
PULMONARY / CRITICAL CARE MEDICINE   Name: Stacie Sanders MRN: [id number] DOB: [birth year]    ADMISSION DATE:  12/29/2015 CONSULTATION DATE:  12/29/15  REFERRING MD:  Nicanor AlconPalumbo  CHIEF COMPLAINT:  Drug overdose  STUDIES:  Port CXR 12/31:  Personally reviewed by me. Streaky opacity in right lower and left lower lung. ETT 3cm above carina.  MICROBIOLOGY: Tracheal Aspirate 12/31>> Blood Ctx x2 12/31>> Urine Ctx 12/31>>  ANTIBIOTICS: Vancomycin 12/31>> Zosyn 12/31>>  SIGNIFICANT EVENTS: 12/31 - Admit & Intubated by EDP  LINES/TUBES: ETT 12/29/15 Foley 12/29/15  SUBJECTIVE: Patient has become febrile since admission overnight. Has evidence of pyuria in her foley catheter.  REVIEW OF SYSTEMS:  Unobtainable as patient is intubated & sedated.  VITAL SIGNS: BP 134/83 mmHg  Pulse 109  Temp(Src) 100.6 F (38.1 C) (Core (Comment))  Resp 22  Ht 5\' 8"  (1.727 m)  Wt 231 lb 14.8 oz (105.2 kg)  BMI 35.27 kg/m2  SpO2 100%  LMP   HEMODYNAMICS:    VENTILATOR SETTINGS: Vent Mode:  [-] PRVC FiO2 (%):  [70 %-100 %] 70 % Set Rate:  [20 bmp-25 bmp] 25 bmp Vt Set:  [450 mL] 450 mL PEEP:  [5 cmH20] 5 cmH20 Plateau Pressure:  [22 cmH20-27 cmH20] 27 cmH20  INTAKE / OUTPUT: I/O last 3 completed shifts: In: -  Out: 2525 [Urine:2400; Emesis/NG output:125]  PHYSICAL EXAMINATION: General:  No distress. Laying in bed on ventilator. Neuro:  Pupils equal. Sedated. HEENT:  Endotracheal tube in place. No scleral icterus or injection.  Cardiovascular:  Regular rate. No edema. No appreciable JVD. Lungs:  Coarse breath sounds bilaterally. Symmetric chest rise on ventilator.  Abdomen:  Soft. Protuberant. Normal bowel sounds. Musculoskeletal:  No joint effusion or deformity. Skin:  Warm & dry. No rash on exposed skin.  LABS:  BMET  Recent Labs Lab 12/29/15 0038  NA 145  K 3.8  CL 107  BUN 4*  CREATININE 0.90  GLUCOSE 114*    Electrolytes No results for input(s): CALCIUM, MG,  PHOS in the last 168 hours.  CBC  Recent Labs Lab 12/29/15 0028 12/29/15 0038  WBC 15.3*  --   HGB 15.6* 17.7*  HCT 47.7* 52.0*  PLT 354  --     Coag's No results for input(s): APTT, INR in the last 168 hours.  Sepsis Markers  Recent Labs Lab 12/29/15 0038 12/29/15 0343  LATICACIDVEN 2.63* 2.81*    ABG  Recent Labs Lab 12/29/15 0203 12/29/15 0359  PHART 7.279* 7.259*  PCO2ART 43.9 50.6*  PO2ART 93.3 182*    Liver Enzymes  Recent Labs Lab 12/29/15 0028  AST 41  ALT 52  ALKPHOS 122  BILITOT 0.5  ALBUMIN 4.1    Cardiac Enzymes No results for input(s): TROPONINI, PROBNP in the last 168 hours.  Glucose No results for input(s): GLUCAP in the last 168 hours.  Imaging Dg Abd 1 View  12/29/2015  CLINICAL DATA:  OG tube placement. EXAM: ABDOMEN - 1 VIEW COMPARISON:  None. FINDINGS: OG tube tip is in the distal stomach. Bowel gas pattern is normal. No osseous abnormality. IMPRESSION: OG tube tip in good position in the distal stomach. Electronically Signed   By: Francene BoyersJames  Maxwell M.D.   On: 12/29/2015 10:50   Dg Chest Portable 1 View  12/29/2015  CLINICAL DATA:  Post intubation. EXAM: PORTABLE CHEST 1 VIEW COMPARISON:  Chest radiograph December 29, 2015 at 0132 hours. FINDINGS: Endotracheal tube tip projects 2.2 cm above the carina. Nasogastric tube tip projects  in proximal stomach, side port projecting immediately above GE junction. New RIGHT middle lobe consolidation and volume loss, LEFT lung base bandlike density. Stable mild cardiomegaly and pulmonary vascular congestion. No pneumothorax. Soft tissue planes and included osseous structures are nonsuspicious. IMPRESSION: Endotracheal tube tip projects 2.2 cm above the carina, nasogastric tube in proximal stomach, side port immediately above the GE junction. Mild cardiomegaly and pulmonary vascular congestion. New RIGHT middle lobe, LEFT lung base consolidation may represent atelectasis or pneumonia, less likely  confluent edema. Electronically Signed   By: Awilda Metro M.D.   On: 12/29/2015 05:02   Dg Chest Portable 1 View  12/29/2015  CLINICAL DATA:  44 year old female with altered mental status EXAM: PORTABLE CHEST 1 VIEW COMPARISON:  Radiograph dated 11/27/2015 FINDINGS: Single-view of the chest demonstrates mild cardiomegaly with diffuse interstitial and vascular prominence likely representing a degree of congestive changes. There is no focal consolidation, pleural effusion, or pneumothorax. The osseous structures appear unremarkable. IMPRESSION: Cardiomegaly with mild congestive changes.  No focal consolidation. Electronically Signed   By: Elgie Collard M.D.   On: 12/29/2015 02:09   ASSESSMENT / PLAN:  PULMONARY A: Acute Hypoxic Respiratory Failure Aspiration Pneumonia vs Pneumonitis  P:   Continue to wean FiO2 for Sat >92% Full Vent Support Holding on SBT until mental status improves See ID section  CARDIOVASCULAR A:  No acute issues  P:  Monitor patient on telemetry Vitals per unit protocol  RENAL A:   Lactic Acidosis UTI/Pyuria   P:   Trend lactic acid q6hr x3 Start LR at 75cc/hr Monitoring UOP with foley catheter Trending renal function daily with BUN/Creatinine  GASTROINTESTINAL A:   No acute issues  P:   NPO Zantac bid  HEMATOLOGIC A:   Leukocytosis - Possibly secondary to sepsis.  P:  Trending cell counts daily with CBC Lovenox [place] daily SCDs  INFECTIOUS A: Sepsis - Likely secondary to UTI vs Aspiration Pneumonia.  P:   Starting Vancomycin & Zosyn empirically Tracheal Aspirate, Urine, & Blood cultures Trending Procalcitonin per algorithm  ENDOCRINE A:   No acute issues    P:   Monitor glucose daily on electrolyte panel.  NEUROLOGIC A:   Overdose Suicide Attempt EtOH Use  P:   RASS goal: 0 Monitor for seizure activity Propofol gtt Fentanyl IV prn Psychiatry consult once patient extubated & suicide  precautions  PSYCHIATRIC A: Suicide Attempt with Overdose H/O Depression/Anxiety  P: Suicide precautions and psych consult once extubated.    FAMILY  - Updates: no family at bedside  - Inter-disciplinary family meet or Palliative Care meeting due by:  01/04/16   DISCUSSION:  44F w/ intentional polysubstance overdose (benzos, alcohol) with subsequent inability to adequately protect her airway and resultant intubation. PMH significant for depression. Patient now with evolving sepsis.  I have spent an additional 11 minutes of critical care time today caring for the patient and reviewing the patient's electronic medical record.  Donna Christen Jamison Neighbor, M.D. [facility] Pager:  [phone] After 3pm or if no response, call [phone] 12/29/2015, 11:27 AM

## 2015-12-29 NOTE — ED Notes (Signed)
Pre- intubation vitals recorded: BP:122/81; HR 107; RR13; O2 100 on 2L; Respiratory Therapy at bedside ; Time out completed

## 2015-12-29 NOTE — ED Notes (Signed)
Poison Control recommends NS fluids as support for tachycardia; also although pt has taken benzodiazepines treat any episode of seizures with benzodiazepines; and if pt Tylenol level results as positive and/or if Hepatic Function tests are elevated treat patient with Mucomyst; and recommended to intubate patient as deemed necessary by ED Physician

## 2015-12-29 NOTE — Progress Notes (Signed)
ANTIBIOTIC CONSULT NOTE - INITIAL  Pharmacy Consult for vancomycin and zosyn Indication: pneumonia  Allergies  Allergen Reactions  . Fentanyl Itching  . Percocet [Oxycodone-Acetaminophen] Hives and Itching    Patient Measurements: Height: 5\' 8"  (172.7 cm) Weight: 231 lb 14.8 oz (105.2 kg) IBW/kg (Calculated) : 63.9   Vital Signs: Temp: 100.6 F (38.1 C) (12/31 1105) Temp Source: Core (Comment) (12/31 1105) BP: 134/83 mmHg (12/31 1105) Pulse Rate: 109 (12/31 1105) Intake/Output from previous day: 12/30 0701 - 12/31 0700 In: -  Out: 2525 [Urine:2400; Emesis/NG output:125] Intake/Output from this shift: Total I/O In: 197.9 [I.V.:197.9] Out: 175 [Urine:175]  Labs:  Recent Labs  12/29/15 0028 12/29/15 0038  WBC 15.3*  --   HGB 15.6* 17.7*  PLT 354  --   CREATININE  --  0.90   Estimated Creatinine Clearance: 103.4 mL/min (by C-G formula based on Cr of 0.9). No results for input(s): VANCOTROUGH, VANCOPEAK, VANCORANDOM, GENTTROUGH, GENTPEAK, GENTRANDOM, TOBRATROUGH, TOBRAPEAK, TOBRARND, AMIKACINPEAK, AMIKACINTROU, AMIKACIN in the last 72 hours.   Microbiology: No results found for this or any previous visit (from the past 720 hour(s)).  Medical History: Past Medical History  Diagnosis Date  . Depression   . Kidney stone    Assessment: Patient's a 44 y.o F presented to the ED on 12/31 for drug overdose.  To start vancomycin and zosyn for suspected aspiration PNA.  - Tmax 100.6, wbc up 15.3, scr 0.90 (crcl~92 N) - LA 2.81 - 12/31 CXR: New RIGHT middle lobe, LEFT lung base consolidation may represent atelectasis or pneumonia  12/31 vanc>> 12/31 zosyn>>  Goal of Therapy:  Vancomycin trough level 15-20 mcg/ml  Plan:  - zosyn 3.375 gm IV q8h (infuse over 4 hours) - vancomycin 1gm IV q8h  Sartaj Hoskin P 12/29/2015,11:32 AM

## 2015-12-29 NOTE — Progress Notes (Signed)
RN spoke with poison control center following patients status. They recommended a lithium level be drawn on the patient. Lithium level ordered to be drawn. Will continue to monitor. Bosie HelperS. Elaria Osias, RN

## 2015-12-29 NOTE — H&P (Signed)
PULMONARY / CRITICAL CARE MEDICINE   Name: Stacie Sanders MRN: [id number] DOB: [birth year]    ADMISSION DATE:  12/29/2015 CONSULTATION DATE:  12/29/15  REFERRING MD:  Nicanor Alcon  CHIEF COMPLAINT:  Drug overdose  HISTORY OF PRESENT ILLNESS:   Stacie Sanders is a 44F with history of depression and anxiety with outpatient prescriptions for Klonopin and Lithium who presented to the [facility] ED on 12/30 after an intentional overdose of benzodiazepines (klonopin, xanax) and alcohol. Reportedly she consumed 20 xanax, 12 Klonopin, 6 shots, 8-9 beers and then reported to the ED. She reportedly has been non-compliant with her lithium. It is unclear if she was taking seroquel. There was also some concern for benadryl or other anti-cholinergic substance. After arrival in the ED she became progressively more obtunded and required intubation. Per the ED provider, the overdose was intentional and she voiced suicidal ideation (her son committed suicide in July).   On my arrival, she is intubated, sedated, and just coming out of her paralytic from intubation. She exhibits some spontaneous movements, but is unresponsive and unable to provide any history.   PAST MEDICAL HISTORY :  She  has a past medical history of Depression and Kidney stone.  PAST SURGICAL HISTORY: She  has past surgical history that includes Tubal ligation and Lithotripsy.  Allergies  Allergen Reactions  . Fentanyl Itching  . Percocet [Oxycodone-Acetaminophen] Hives and Itching    No current facility-administered medications on file prior to encounter.   Current Outpatient Prescriptions on File Prior to Encounter  Medication Sig  . naproxen sodium (ANAPROX) 220 MG tablet Take 440 mg by mouth 2 (two) times daily as needed (pain).  . QUEtiapine (SEROQUEL XR) 50 MG TB24 24 hr tablet Take 150 mg by mouth at bedtime.  . dicyclomine (BENTYL) 20 MG tablet Take 1 tablet (20 mg total) by mouth 2 (two) times daily. (Patient not taking: Reported on  11/28/2015)  . HYDROcodone-acetaminophen (NORCO/VICODIN) 5-325 MG per tablet Take 1-2 tablets by mouth every 6 (six) hours as needed. (Patient not taking: Reported on 11/28/2015)  . oxyCODONE-acetaminophen (PERCOCET/ROXICET) 5-325 MG per tablet Take 2 tablets by mouth every 4 (four) hours as needed for severe pain. (Patient not taking: Reported on 11/28/2015)  . promethazine (PHENERGAN) 25 MG tablet Take 1 tablet (25 mg total) by mouth every 6 (six) hours as needed for nausea or vomiting. (Patient not taking: Reported on 12/29/2015)    FAMILY HISTORY:  Her has no family status information on file.   SOCIAL HISTORY: She  reports that she has been smoking Cigarettes.  She does not have any smokeless tobacco history on file. She reports that she drinks alcohol. She reports that she does not use illicit drugs.  REVIEW OF SYSTEMS:   Unable to obtain 2/2 intubation / sedation  SUBJECTIVE:  N/a  VITAL SIGNS: BP 142/96 mmHg  Pulse 108  Temp(Src) 98.2 F (36.8 C)  Resp 25  Wt 106.6 kg (235 lb 0.2 oz)  SpO2 100%  HEMODYNAMICS:    VENTILATOR SETTINGS: Vent Mode:  [-] PRVC FiO2 (%):  [80 %-100 %] 80 % Set Rate:  [20 bmp-25 bmp] 25 bmp Vt Set:  [450 mL] 450 mL PEEP:  [5 cmH20] 5 cmH20 Plateau Pressure:  [22 cmH20-23 cmH20] 23 cmH20  INTAKE / OUTPUT:    PHYSICAL EXAMINATION: General:  WNWD female, intubated, sedated, partially paralyzed.  Neuro:  Difficult to assess.  HEENT:  PERRL, no gross abnormalities. ETT/OGT in place.  Cardiovascular:  S1/S2, no M/R/G,  normal rate, regular rhythm. No pedal edema. Peripheral pulses palpable.  Lungs:  Coarse bilaterally without wheezes, rales or ronchi. Ventilator-assisted resiprations.  Abdomen:  Soft, non-distended, obese, +bs. Unable to assess tenderness Musculoskeletal:  No gross abnormalities.  Skin:  Multiple tattoos. Otherwise no rashes/sores / ulcers.   LABS:  BMET  Recent Labs Lab 12/29/15 0038  NA 145  K 3.8  CL 107  BUN  4*  CREATININE 0.90  GLUCOSE 114*    Electrolytes No results for input(s): CALCIUM, MG, PHOS in the last 168 hours.  CBC  Recent Labs Lab 12/29/15 0028 12/29/15 0038  WBC 15.3*  --   HGB 15.6* 17.7*  HCT 47.7* 52.0*  PLT 354  --     Coag's No results for input(s): APTT, INR in the last 168 hours.  Sepsis Markers  Recent Labs Lab 12/29/15 0038 12/29/15 0343  LATICACIDVEN 2.63* 2.81*    ABG  Recent Labs Lab 12/29/15 0203 12/29/15 0359  PHART 7.279* 7.259*  PCO2ART 43.9 50.6*  PO2ART 93.3 182*    Liver Enzymes  Recent Labs Lab 12/29/15 0028  AST 41  ALT 52  ALKPHOS 122  BILITOT 0.5  ALBUMIN 4.1    Cardiac Enzymes No results for input(s): TROPONINI, PROBNP in the last 168 hours.  Glucose No results for input(s): GLUCAP in the last 168 hours.  Imaging Dg Chest Portable 1 View  12/29/2015  CLINICAL DATA:  44 year old female with altered mental status EXAM: PORTABLE CHEST 1 VIEW COMPARISON:  Radiograph dated 11/27/2015 FINDINGS: Single-view of the chest demonstrates mild cardiomegaly with diffuse interstitial and vascular prominence likely representing a degree of congestive changes. There is no focal consolidation, pleural effusion, or pneumothorax. The osseous structures appear unremarkable. IMPRESSION: Cardiomegaly with mild congestive changes.  No focal consolidation. Electronically Signed   By: Elgie Collard M.D.   On: 12/29/2015 02:09     STUDIES:    CULTURES: n/a  ANTIBIOTICS: N/a  SIGNIFICANT EVENTS: intubation  LINES/TUBES: ETT 12/29/15 Foley 12/29/15  DISCUSSION: 44F w/ intentional polysubstance overdose (benzos, alcohol) with subsequent inability to adequately protect her airway and resultant intubation. PMH significant for depression.   ASSESSMENT / PLAN:  PULMONARY A: Respiratory failure 2/2 inability to protect airway Pulmonary edema +/- aspiration pneumonitis  P:   Continue ventilatory support for adequate  oxygen delivery and CO2 clearance.  Wean sedation as tolerated Daily SBT Extubate when able Hold off on antibiotics Diurese as bp tolerates  CARDIOVASCULAR A:  No acute issues  P:  Monitor  RENAL A:   Evolving metabolic acidosis 2/2 elevated lactate Electrolytes okay; Cr ok  P:   Trend lactate Fluid resuscitate Monitor lytes  GASTROINTESTINAL A:   No acute issues  P:   Bowel regimen  HEMATOLOGIC A:   No acute issues; likely hemoconcentrated  P:  Fluid resuscitate DVT ppx w/ lovenox  INFECTIOUS A: No acute issues  P:     ENDOCRINE A:   No acute issues    P:    NEUROLOGIC A:   No acute issues; potential for seizure with benzo or EtOH withdrawal  P:   Monitor for seizure activity; prn benzos RASS goal: 0 Propofol for ventilator tolerance Wean as tolerated   PSYCHIATRIC A: Depression / anxiety with suicidal ideation and attempt  P: Suicide precautions and psych consult once extubated.    FAMILY  - Updates: no family at bedside  - Inter-disciplinary family meet or Palliative Care meeting due by:  d7   CRITICAL CARE Performed  by: Orville GovernSarah Ellen Leonard Feigel, MD   Total critical care time: 45 minutes  Critical care time was exclusive of separately billable procedures and treating other patients.  Critical care was necessary to treat or prevent imminent or life-threatening deterioration.  Critical care was time spent personally by me on the following activities: development of treatment plan with patient and/or surrogate as well as nursing, discussions with consultants, evaluation of patient's response to treatment, examination of patient, obtaining history from patient or surrogate, ordering and performing treatments and interventions, ordering and review of laboratory studies, ordering and review of radiographic studies, pulse oximetry and re-evaluation of patient's condition.  Orville GovernSarah Ellen Marjean Imperato, MD Pulmonary and Critical Care  [facility] Pager: [phone]  12/29/2015, 4:42 AM

## 2015-12-29 NOTE — ED Provider Notes (Signed)
CSN: [id number]     Arrival date & time 12/29/15  0004 History   By signing my name below, I, Arlan Organ, attest that this documentation has been prepared under the direction and in the presence of Jamara Vary, MD.  Electronically Signed: Arlan Organ, ED Scribe. 12/29/2015. 12:50 AM.   Chief Complaint  Patient presents with  . Drug Overdose; SI   Patient is a 44 y.o. female presenting with Overdose. The history is provided by the patient and the EMS personnel. The history is limited by the condition of the patient. No language interpreter was used.  Drug Overdose This is a new problem. The current episode started 3 to 5 hours ago. The problem occurs constantly. The problem has not changed since onset.Pertinent negatives include no chest pain, no abdominal pain, no headaches and no shortness of breath. Nothing aggravates the symptoms. Nothing relieves the symptoms. She has tried nothing for the symptoms. The treatment provided no relief.    HPI Comments: Stacie Sanders brought in by EMS is a 44 y.o. female with a PMHx of depression who presents to the Emergency Department here for an overdose this evening. Pt admits to consuming 20 Xanax, 12 Klonopin, 6 shots, and 8-9 beers approximately 1 hour ago. Denies any illicit drug use. She denies taking an additional Seroquel or Lithium this evening. No recent fever, chills, nausea, vomiting, or diarrhea. She admits to suicidal ideation this evening.  PCP: No PCP Per Patient    Past Medical History  Diagnosis Date  . Depression   . Kidney stone    Past Surgical History  Procedure Laterality Date  . Tubal ligation    . Lithotripsy     No family history on file. Social History  Substance Use Topics  . Smoking status: Current Every Day Smoker    Types: Cigarettes  . Smokeless tobacco: Not on file  . Alcohol Use: Yes     Comment: occasionally   OB History    No data available     Review of Systems  Unable to perform ROS: Acuity of  condition  Constitutional: Negative for fever and chills.  Respiratory: Negative for shortness of breath.   Cardiovascular: Negative for chest pain.  Gastrointestinal: Negative for nausea, vomiting and abdominal pain.  Musculoskeletal: Negative for back pain.  Neurological: Negative for headaches.  Psychiatric/Behavioral: Positive for suicidal ideas.      Allergies  Fentanyl and Percocet  Home Medications   Prior to Admission medications   Medication Sig Start Date End Date Taking? Authorizing Provider  clonazePAM (KLONOPIN) 2 MG tablet Take 2 mg by mouth at bedtime.    Historical Provider, MD  dicyclomine (BENTYL) 20 MG tablet Take 1 tablet (20 mg total) by mouth 2 (two) times daily. Patient not taking: Reported on 11/28/2015 07/11/15   Emilia Beck, PA-C  HYDROcodone-acetaminophen (NORCO/VICODIN) 5-325 MG per tablet Take 1-2 tablets by mouth every 6 (six) hours as needed. Patient not taking: Reported on 11/28/2015 07/03/15   Tery Sanfilippo, MD  lithium carbonate 300 MG capsule Take 900 mg by mouth at bedtime.     Historical Provider, MD  naproxen sodium (ANAPROX) 220 MG tablet Take 440 mg by mouth 2 (two) times daily as needed (pain).    Historical Provider, MD  oxyCODONE-acetaminophen (PERCOCET/ROXICET) 5-325 MG per tablet Take 2 tablets by mouth every 4 (four) hours as needed for severe pain. Patient not taking: Reported on 11/28/2015 07/11/15   Emilia Beck, PA-C  promethazine (PHENERGAN) 25 MG tablet  Take 1 tablet (25 mg total) by mouth every 6 (six) hours as needed for nausea or vomiting. 07/11/15   Emilia Beck, PA-C  QUEtiapine (SEROQUEL XR) 50 MG TB24 24 hr tablet Take 150 mg by mouth at bedtime.    Historical Provider, MD   Triage Vitals: BP 104/87 mmHg  Pulse 114  Resp 15  SpO2 92%   Physical Exam  Constitutional: She appears well-developed and well-nourished.  HENT:  Head: Normocephalic and atraumatic.  Eyes: Pupils are equal, round, and reactive to  light. Right conjunctiva is injected. Left conjunctiva is injected. Right eye exhibits nystagmus (rotatory). Left eye exhibits nystagmus (rotatory).  Neck: Normal range of motion. No tracheal deviation present.  Cardiovascular: Regular rhythm and normal heart sounds.  Tachycardia present.   Pulmonary/Chest: Effort normal. Tachypnea noted. She has decreased breath sounds.  Abdominal: Soft. She exhibits distension. There is no tenderness.  Hyperactive bowel sounds   Musculoskeletal: Normal range of motion. She exhibits no edema.  Lymphadenopathy:    She has no cervical adenopathy.  Neurological: She is alert. She has normal reflexes.  Skin: Skin is warm and dry. She is not diaphoretic.  Psychiatric: Judgment normal. She expresses suicidal ideation.  Nursing note and vitals reviewed.   ED Course  .Intubation Date/Time: 12/29/2015 2:28 AM Performed by: Cy Blamer Authorized by: Cy Blamer Patient identity confirmed: arm band Indications: respiratory failure and  airway protection Intubation method: video-assisted Patient status: sedated Preoxygenation: BVM Sedatives: etomidate Paralytic: rocuronium Laryngoscope size: ranger 4 for glidescope. Tube size: 7.5 mm Tube type: cuffed Number of attempts: 2 Ventilation between attempts: BVM Cricoid pressure: no Cords visualized: yes Post-procedure assessment: chest rise,  ETCO2 monitor and CO2 detector Breath sounds: equal Cuff inflated: yes ETT to lip: 23 cm Tube secured with: ETT holder Chest x-ray findings: endotracheal tube in appropriate position   (including critical care time)  DIAGNOSTIC STUDIES: Oxygen Saturation is 92% on RA, adequate by my interpretation.    COORDINATION OF CARE: 12:09 AM- Will order ethanol, acetaminophen level, salicylate level, lithium level, urine rapid drug screen, hepatic function panel, CBC, i-stat CG4 lactic acid, EKG and i-stat chem 8. Discussed treatment plan with pt at bedside and pt  agreed to plan.     Labs Review Labs Reviewed - No data to display  Imaging Review No results found. I have personally reviewed and evaluated these images and lab results as part of my medical decision-making.   EKG Interpretation None      MDM   Final diagnoses:  None     EKG Interpretation  Date/Time:  Saturday December 29 2015 00:18:31 EST Ventricular Rate:  103 PR Interval:  158 QRS Duration: 88 QT Interval:  367 QTC Calculation: 480 R Axis:   26 Text Interpretation:  Sinus tachycardia Confirmed by [facility]  MD, Toinette Lackie ([id number]) on 12/29/2015 12:23:32 AM      Results for orders placed or performed during the hospital encounter of 12/29/15  CBC with Differential/Platelet  Result Value Ref Range   WBC 15.3 (H) 4.0 - 10.5 K/uL   RBC 4.83 3.87 - 5.11 MIL/uL   Hemoglobin 15.6 (H) 12.0 - 15.0 g/dL   HCT 91.4 (H) 78.2 - 95.6 %   MCV 98.8 78.0 - 100.0 fL   MCH 32.3 26.0 - 34.0 pg   MCHC 32.7 30.0 - 36.0 g/dL   RDW 21.3 08.6 - 57.8 %   Platelets 354 150 - 400 K/uL   Neutrophils Relative % 57 %   Neutro Abs  8.7 (H) 1.7 - 7.7 K/uL   Lymphocytes Relative 36 %   Lymphs Abs 5.5 (H) 0.7 - 4.0 K/uL   Monocytes Relative 4 %   Monocytes Absolute 0.6 0.1 - 1.0 K/uL   Eosinophils Relative 3 %   Eosinophils Absolute 0.5 0.0 - 0.7 K/uL   Basophils Relative 0 %   Basophils Absolute 0.0 0.0 - 0.1 K/uL  Hepatic function panel  Result Value Ref Range   Total Protein 7.4 6.5 - 8.1 g/dL   Albumin 4.1 3.5 - 5.0 g/dL   AST 41 15 - 41 U/L   ALT 52 14 - 54 U/L   Alkaline Phosphatase 122 38 - 126 U/L   Total Bilirubin 0.5 0.3 - 1.2 mg/dL   Bilirubin, Direct 0.2 0.1 - 0.5 mg/dL   Indirect Bilirubin 0.3 0.3 - 0.9 mg/dL  Lithium level  Result Value Ref Range   Lithium Lvl 0.29 (L) 0.60 - 1.20 mmol/L  Urine rapid drug screen (hosp performed)  Result Value Ref Range   Opiates NONE DETECTED NONE DETECTED   Cocaine NONE DETECTED NONE DETECTED   Benzodiazepines POSITIVE (A) NONE  DETECTED   Amphetamines NONE DETECTED NONE DETECTED   Tetrahydrocannabinol NONE DETECTED NONE DETECTED   Barbiturates NONE DETECTED NONE DETECTED  Ethanol  Result Value Ref Range   Alcohol, Ethyl (B) 160 (H) <5 mg/dL  Acetaminophen level  Result Value Ref Range   Acetaminophen (Tylenol), Serum <10 (L) 10 - 30 ug/mL  Salicylate level  Result Value Ref Range   Salicylate Lvl <4.0 2.8 - 30.0 mg/dL  Blood gas, arterial  Result Value Ref Range   O2 Content 2.0 L/min   Delivery systems NASAL CANNULA    pH, Arterial 7.279 (L) 7.350 - 7.450   pCO2 arterial 43.9 35.0 - 45.0 mmHg   pO2, Arterial 93.3 80.0 - 100.0 mmHg   Bicarbonate 19.9 (L) 20.0 - 24.0 mEq/L   TCO2 17.9 0 - 100 mmol/L   Acid-base deficit 6.3 (H) 0.0 - 2.0 mmol/L   O2 Saturation 96.1 %   Patient temperature 98.6    Collection site LEFT RADIAL    Drawn by [id number]    Sample type ARTERIAL DRAW    Allens test (pass/fail) PASS PASS  Blood gas, arterial  Result Value Ref Range   FIO2 1.00    Delivery systems VENTILATOR    Mode PRESSURE REGULATED VOLUME CONTROL    VT 450 mL   LHR 20 resp/min   Peep/cpap 5.0 cm H20   pH, Arterial 7.259 (L) 7.350 - 7.450   pCO2 arterial 50.6 (H) 35.0 - 45.0 mmHg   pO2, Arterial 182 (H) 80.0 - 100.0 mmHg   Bicarbonate 21.9 20.0 - 24.0 mEq/L   TCO2 19.5 0 - 100 mmol/L   Acid-base deficit 5.4 (H) 0.0 - 2.0 mmol/L   O2 Saturation 98.7 %   Patient temperature 36.9    Collection site LEFT RADIAL    Drawn by [phone]    Sample type ARTERIAL DRAW    Allens test (pass/fail) PASS PASS  I-Stat CG4 Lactic Acid, ED  Result Value Ref Range   Lactic Acid, Venous 2.63 (HH) 0.5 - 2.0 mmol/L   Comment NOTIFIED PHYSICIAN   I-stat chem 8, ed  Result Value Ref Range   Sodium 145 135 - 145 mmol/L   Potassium 3.8 3.5 - 5.1 mmol/L   Chloride 107 101 - 111 mmol/L   BUN 4 (L) 6 - 20 mg/dL   Creatinine, Ser 8.110.90 0.44 -  1.00 mg/dL   Glucose, Bld 161 (H) 65 - 99 mg/dL   Calcium, Ion 0.96 0.45 - 1.23 mmol/L    TCO2 25 0 - 100 mmol/L   Hemoglobin 17.7 (H) 12.0 - 15.0 g/dL   HCT 40.9 (H) 81.1 - 91.4 %  I-Stat CG4 Lactic Acid, ED  Result Value Ref Range   Lactic Acid, Venous 2.81 (HH) 0.5 - 2.0 mmol/L   Comment NOTIFIED PHYSICIAN    Dg Chest Portable 1 View  12/29/2015  CLINICAL DATA:  44 year old female with altered mental status EXAM: PORTABLE CHEST 1 VIEW COMPARISON:  Radiograph dated 11/27/2015 FINDINGS: Single-view of the chest demonstrates mild cardiomegaly with diffuse interstitial and vascular prominence likely representing a degree of congestive changes. There is no focal consolidation, pleural effusion, or pneumothorax. The osseous structures appear unremarkable. IMPRESSION: Cardiomegaly with mild congestive changes.  No focal consolidation. Electronically Signed   By: Elgie Collard M.D.   On: 12/29/2015 02:09    Medications  ondansetron (ZOFRAN) injection 4 mg (4 mg Intravenous Given 12/29/15 0026)  sodium chloride 0.9 % bolus 1,000 mL (1,000 mLs Intravenous New Bag/Given 12/29/15 0119)  naloxone [facility]) injection 2 mg (2 mg Intravenous Given 12/29/15 0153)  furosemide (LASIX) injection 40 mg (40 mg Intravenous Given 12/29/15 0301)  vecuronium (NORCURON) injection 10 mg (10 mg Intravenous Given 12/29/15 0238)  propofol (DIPRIVAN) 1000 MG/100ML infusion (10 mcg/kg/min  106.6 kg Intravenous Rate/Dose Change 12/29/15 0304)  sterile water (preservative free) injection (  Given 12/29/15 0238)  etomidate (AMIDATE) injection 31.98 mg (30 mg Intravenous Given 12/29/15 0222)  rocuronium (ZEMURON) injection 106.6 mg (100 mg Intravenous Given 12/29/15 0223)    MDM Reviewed: previous chart, nursing note and vitals Reviewed previous: labs Interpretation: labs, ECG and x-ray (pulmonary edema by me on CXR, elevated lactate , elevated ETOH, positive benzos in UDS) Total time providing critical care: > 105 minutes. This excludes time spent performing separately reportable procedures and  services. Consults: critical care    CRITICAL CARE Performed by: Jasmine Awe Total critical care time: 120 minutes Critical care time was exclusive of separately billable procedures and treating other patients. Critical care was necessary to treat or prevent imminent or life-threatening deterioration. Critical care was time spent personally by me on the following activities: development of treatment plan with patient and/or surrogate as well as nursing, discussions with consultants, evaluation of patient's response to treatment, examination of patient, obtaining history from patient or surrogate, ordering and performing treatments and interventions, ordering and review of laboratory studies, ordering and review of radiographic studies, pulse oximetry and re-evaluation of patient's condition.  I personally performed the services described in this documentation, which was scribed in my presence. The recorded information has been reviewed and is accurate.     Cy Blamer, MD 12/29/15 [phone]

## 2015-12-29 NOTE — ED Notes (Signed)
Report called. Care link in route

## 2015-12-30 DIAGNOSIS — T50902A Poisoning by unspecified drugs, medicaments and biological substances, intentional self-harm, initial encounter: Secondary | ICD-10-CM

## 2015-12-30 DIAGNOSIS — N342 Other urethritis: Secondary | ICD-10-CM

## 2015-12-30 DIAGNOSIS — A419 Sepsis, unspecified organism: Secondary | ICD-10-CM

## 2015-12-30 DIAGNOSIS — J9601 Acute respiratory failure with hypoxia: Secondary | ICD-10-CM

## 2015-12-30 DIAGNOSIS — J690 Pneumonitis due to inhalation of food and vomit: Secondary | ICD-10-CM

## 2015-12-30 LAB — CBC WITH DIFFERENTIAL/PLATELET
Basophils Absolute: 0 10*3/uL (ref 0.0–0.1)
Basophils Relative: 0 %
EOS ABS: 0.2 10*3/uL (ref 0.0–0.7)
EOS PCT: 1 %
HCT: 45.1 % (ref 36.0–46.0)
Hemoglobin: 14.7 g/dL (ref 12.0–15.0)
LYMPHS ABS: 3.1 10*3/uL (ref 0.7–4.0)
Lymphocytes Relative: 16 %
MCH: 31.7 pg (ref 26.0–34.0)
MCHC: 32.6 g/dL (ref 30.0–36.0)
MCV: 97.2 fL (ref 78.0–100.0)
MONOS PCT: 5 %
Monocytes Absolute: 1 10*3/uL (ref 0.1–1.0)
Neutro Abs: 14.9 10*3/uL — ABNORMAL HIGH (ref 1.7–7.7)
Neutrophils Relative %: 78 %
PLATELETS: 346 10*3/uL (ref 150–400)
RBC: 4.64 MIL/uL (ref 3.87–5.11)
RDW: 13.3 % (ref 11.5–15.5)
WBC: 19.2 10*3/uL — AB (ref 4.0–10.5)

## 2015-12-30 LAB — RENAL FUNCTION PANEL
ALBUMIN: 3.5 g/dL (ref 3.5–5.0)
Anion gap: 12 (ref 5–15)
BUN: 8 mg/dL (ref 6–20)
CALCIUM: 8.6 mg/dL — AB (ref 8.9–10.3)
CHLORIDE: 106 mmol/L (ref 101–111)
CO2: 24 mmol/L (ref 22–32)
CREATININE: 0.71 mg/dL (ref 0.44–1.00)
GFR calc Af Amer: >60 mL/min (ref >60)
Glucose, Bld: 135 mg/dL — ABNORMAL HIGH (ref 65–99)
Phosphorus: 2.2 mg/dL — ABNORMAL LOW (ref 2.5–4.6)
Potassium: 3.4 mmol/L — ABNORMAL LOW (ref 3.5–5.1)
SODIUM: 142 mmol/L (ref 135–145)

## 2015-12-30 LAB — LACTIC ACID, PLASMA: LACTIC ACID, VENOUS: 1.7 mmol/L (ref 0.5–2.0)

## 2015-12-30 LAB — URINE CULTURE: Culture: NO GROWTH

## 2015-12-30 LAB — TRIGLYCERIDES: TRIGLYCERIDES: 333 mg/dL — AB (ref <150)

## 2015-12-30 LAB — LITHIUM LEVEL: Lithium Lvl: 0.12 mmol/L — ABNORMAL LOW (ref 0.60–1.20)

## 2015-12-30 LAB — MAGNESIUM: MAGNESIUM: 1.7 mg/dL (ref 1.7–2.4)

## 2015-12-30 LAB — PROCALCITONIN

## 2015-12-30 MED ORDER — POTASSIUM CHLORIDE 20 MEQ/15ML (10%) PO SOLN
40.0000 meq | Freq: Once | ORAL | Status: AC
  Administered 2015-12-30: 40 meq
  Filled 2015-12-30: qty 30

## 2015-12-30 NOTE — Progress Notes (Signed)
Per hand off report, and spouse, the patient has difficulty swallowing and has an appointment scheduled to have her esophagus stretched.

## 2015-12-30 NOTE — Progress Notes (Signed)
PULMONARY / CRITICAL CARE MEDICINE   Name: Stacie Sanders MRN: [id number] DOB: 05/27/1971    ADMISSION DATE:  12/29/2015 CONSULTATION DATE:  12/29/15  REFERRING MD:  Stacie Sanders  CHIEF COMPLAINT:  Drug overdose  STUDIES:  Port CXR 12/31:  Personally reviewed by me. Streaky opacity in right lower and left lower lung. ETT 3cm above carina.  MICROBIOLOGY: Tracheal Aspirate 12/31>> Blood Ctx x2 12/31>> Urine Ctx 12/31>>  ANTIBIOTICS: Vancomycin 12/31>> Zosyn 12/31>>  SIGNIFICANT EVENTS: 12/31 - Admit & Intubated by EDP  LINES/TUBES: ETT 12/29/15 Foley 12/29/15  SUBJECTIVE: No acute events overnight. Patient's wedding ring had to be cut off of her finger due to swelling overnight. Patient with intermittent agitation with attempted sedation vacation.  REVIEW OF SYSTEMS:  Unobtainable as patient is intubated & sedated.  VITAL SIGNS: BP 127/70 mmHg  Pulse 89  Temp(Src) 99.5 F (37.5 C) (Core (Comment))  Resp 25  Ht 5\' 8"  (1.727 m)  Wt 231 lb 14.8 oz (105.2 kg)  BMI 35.27 kg/m2  SpO2 97%  LMP   HEMODYNAMICS:    VENTILATOR SETTINGS: Vent Mode:  [-] PRVC FiO2 (%):  [40 %-80 %] 40 % Set Rate:  [25 bmp] 25 bmp Vt Set:  [450 mL] 450 mL PEEP:  [5 cmH20] 5 cmH20 Plateau Pressure:  [18 cmH20-27 cmH20] 20 cmH20  INTAKE / OUTPUT: I/O last 3 completed shifts: In: 2715 [I.V.:1905; NG/GT:60; IV Piggyback:750] Out: 3547 [Urine:3422; Emesis/NG output:125]  PHYSICAL EXAMINATION: General:  Laying in bed. Intermittent coughing. No distress. Neuro:  Pupils equal. Sedated. Spontaneously moving all 4 extremities but no purposeful movement. HEENT:  Endotracheal tube in place. No scleral icterus or injection.  Cardiovascular:  Regular rate. No edema. No appreciable JVD. Lungs:  Coarse breath sounds bilaterally. Tan respiratory secretions. Symmetric chest rise on ventilator.  Abdomen:  Soft. Protuberant. Normal bowel sounds. Skin:  Warm & dry. No rash on exposed  skin.  LABS:  BMET  Recent Labs Lab 12/29/15 0038 12/30/15 0353  NA 145 142  K 3.8 3.4*  CL 107 106  CO2  --  24  BUN 4* 8  CREATININE 0.90 0.71  GLUCOSE 114* 135*    Electrolytes  Recent Labs Lab 12/30/15 0353  CALCIUM 8.6*  MG 1.7  PHOS 2.2*    CBC  Recent Labs Lab 12/29/15 0028 12/29/15 0038 12/30/15 0353  WBC 15.3*  --  19.2*  HGB 15.6* 17.7* 14.7  HCT 47.7* 52.0* 45.1  PLT 354  --  346    Coag's No results for input(s): APTT, INR in the last 168 hours.  Sepsis Markers  Recent Labs Lab 12/29/15 1201 12/29/15 1818 12/29/15 2327 12/30/15 0353  LATICACIDVEN 2.8* 1.8 1.7  --   PROCALCITON 0.10  --   --  <0.10    ABG  Recent Labs Lab 12/29/15 0203 12/29/15 0359  PHART 7.279* 7.259*  PCO2ART 43.9 50.6*  PO2ART 93.3 182*    Liver Enzymes  Recent Labs Lab 12/29/15 0028 12/30/15 0353  AST 41  --   ALT 52  --   ALKPHOS 122  --   BILITOT 0.5  --   ALBUMIN 4.1 3.5    Cardiac Enzymes No results for input(s): TROPONINI, PROBNP in the last 168 hours.  Glucose No results for input(s): GLUCAP in the last 168 hours.  Imaging Dg Abd 1 View  12/29/2015  CLINICAL DATA:  OG tube placement. EXAM: ABDOMEN - 1 VIEW COMPARISON:  None. FINDINGS: OG tube tip is in the distal stomach.  Bowel gas pattern is normal. No osseous abnormality. IMPRESSION: OG tube tip in good position in the distal stomach. Electronically Signed   By: Francene Boyers M.D.   On: 12/29/2015 10:50   ASSESSMENT / PLAN:  PULMONARY A: Acute Hypoxic Respiratory Failure Aspiration Pneumonia vs Pneumonitis  P:   Continue to wean FiO2 for Sat >92% Full Vent Support Plan for sedation vacation & SBT in AM 1/2 See ID section  CARDIOVASCULAR A:  No acute issues  P:  Monitor patient on telemetry Vitals per unit protocol  RENAL A:   Hypokalemia - Mild. Lactic Acidosis - Resolved. UTI/Pyuria   P:   KCl VT once LR at 75cc/hr Monitoring UOP with foley  catheter Trending renal function daily with BUN/Creatinine  GASTROINTESTINAL A:   No acute issues  P:   NPO Zantac bid Dietician consult for tube feeding recommendations  HEMATOLOGIC A:   Leukocytosis - Possibly secondary to sepsis. Rising.  P:  Trending cell counts daily with CBC Lovenox [place] daily SCDs  INFECTIOUS A: Sepsis - Likely secondary to UTI vs Aspiration Pneumonia.  P:   Vancomycin & Zosyn Day #2 Awaiting Tracheal Aspirate, Urine, & Blood cultures Procalcitonin now undetectable  ENDOCRINE A:   Hyperglycemia - No h/o DM.    P:   Checking Hgb A1c Accu-Checks q4hr Low dose SSI per algorithm  NEUROLOGIC A:   Overdose Suicide Attempt EtOH Use  P:   RASS goal: 0 Monitor for seizure activity Propofol gtt Fentanyl IV prn Consider Precedex if agitation and possible delirium continue Psychiatry consult once patient extubated & suicide precautions  PSYCHIATRIC A: Suicide Attempt with Overdose H/O Depression/Anxiety  P: Suicide precautions and psych consult once extubated.    FAMILY  - Updates: No family at bedside. Husband updated by RN yesterday.  - Inter-disciplinary family meet or Palliative Care meeting due by:  01/04/16   DISCUSSION:  38F w/ intentional polysubstance overdose (benzos, alcohol) with subsequent inability to adequately protect her airway and resultant intubation. PMH significant for depression. Cultures pending from yesterday with the addition of empiric antibiotics. Patient now has agitation likely due to some element of delirium from resolving overdose. Plan for repeat spontaneous breathing trial and sedation vacation tomorrow morning.  I have spent 36 minutes of critical care time today caring for the patient and reviewing the patient's electronic medical record.  Stacie Sanders, M.D. [facility] Pulmonary & Critical Care Pager:  [phone] After 3pm or if no response, call [phone] 12/30/2015, 7:14 AM

## 2015-12-31 ENCOUNTER — Inpatient Hospital Stay (HOSPITAL_COMMUNITY): Payer: Medicaid Other

## 2015-12-31 LAB — CBC WITH DIFFERENTIAL/PLATELET
BASOS ABS: 0 10*3/uL (ref 0.0–0.1)
Basophils Relative: 0 %
Eosinophils Absolute: 0.2 10*3/uL (ref 0.0–0.7)
Eosinophils Relative: 1 %
HEMATOCRIT: 41.2 % (ref 36.0–46.0)
Hemoglobin: 13.3 g/dL (ref 12.0–15.0)
LYMPHS ABS: 2.8 10*3/uL (ref 0.7–4.0)
LYMPHS PCT: 17 %
MCH: 31.7 pg (ref 26.0–34.0)
MCHC: 32.3 g/dL (ref 30.0–36.0)
MCV: 98.1 fL (ref 78.0–100.0)
MONO ABS: 0.9 10*3/uL (ref 0.1–1.0)
Monocytes Relative: 5 %
NEUTROS ABS: 12.7 10*3/uL — AB (ref 1.7–7.7)
Neutrophils Relative %: 77 %
Platelets: 335 10*3/uL (ref 150–400)
RBC: 4.2 MIL/uL (ref 3.87–5.11)
RDW: 13.4 % (ref 11.5–15.5)
WBC: 16.6 10*3/uL — ABNORMAL HIGH (ref 4.0–10.5)

## 2015-12-31 LAB — RENAL FUNCTION PANEL
ALBUMIN: 3.4 g/dL — AB (ref 3.5–5.0)
ANION GAP: 12 (ref 5–15)
BUN: 9 mg/dL (ref 6–20)
CHLORIDE: 110 mmol/L (ref 101–111)
CO2: 23 mmol/L (ref 22–32)
Calcium: 9 mg/dL (ref 8.9–10.3)
Creatinine, Ser: 0.84 mg/dL (ref 0.44–1.00)
GFR calc Af Amer: >60 mL/min (ref >60)
GFR calc non Af Amer: >60 mL/min (ref >60)
GLUCOSE: 131 mg/dL — AB (ref 65–99)
PHOSPHORUS: 2.5 mg/dL (ref 2.5–4.6)
POTASSIUM: 3.3 mmol/L — AB (ref 3.5–5.1)
Sodium: 145 mmol/L (ref 135–145)

## 2015-12-31 LAB — TRIGLYCERIDES
Triglycerides: 333 mg/dL — ABNORMAL HIGH (ref <150)
Triglycerides: 334 mg/dL — ABNORMAL HIGH (ref <150)

## 2015-12-31 LAB — URINALYSIS, ROUTINE W REFLEX MICROSCOPIC
Glucose, UA: NEGATIVE mg/dL
Ketones, ur: NEGATIVE mg/dL
NITRITE: NEGATIVE
PH: 6.5 (ref 5.0–8.0)
PROTEIN: 100 mg/dL — AB
SPECIFIC GRAVITY, URINE: 1.024 (ref 1.005–1.030)

## 2015-12-31 LAB — PROCALCITONIN: Procalcitonin: <0.1 ng/mL

## 2015-12-31 LAB — URINE MICROSCOPIC-ADD ON

## 2015-12-31 LAB — GLUCOSE, CAPILLARY
GLUCOSE-CAPILLARY: 123 mg/dL — AB (ref 65–99)
Glucose-Capillary: 124 mg/dL — ABNORMAL HIGH (ref 65–99)

## 2015-12-31 MED ORDER — PRO-STAT SUGAR FREE PO LIQD
30.0000 mL | Freq: Three times a day (TID) | ORAL | Status: DC
  Administered 2015-12-31 – 2016-01-03 (×8): 30 mL
  Filled 2015-12-31 (×8): qty 30

## 2015-12-31 MED ORDER — SODIUM CHLORIDE 0.9 % IV SOLN
25.0000 ug/h | INTRAVENOUS | Status: DC
  Administered 2015-12-31: 25 ug/h via INTRAVENOUS
  Filled 2015-12-31 (×2): qty 50

## 2015-12-31 MED ORDER — POTASSIUM CHLORIDE CRYS ER 20 MEQ PO TBCR: 40.0000 meq | EXTENDED_RELEASE_TABLET | Freq: Once | ORAL | Status: DC

## 2015-12-31 MED ORDER — DEXMEDETOMIDINE BOLUS VIA INFUSION
1.0000 ug/kg | Freq: Once | INTRAVENOUS | Status: AC
Start: 2015-12-31 — End: 2015-12-31
  Administered 2015-12-31: 108.8 ug via INTRAVENOUS

## 2015-12-31 MED ORDER — SODIUM CHLORIDE 0.9 % IV SOLN
3.0000 g | Freq: Four times a day (QID) | INTRAVENOUS | Status: DC
  Administered 2015-12-31 – 2016-01-03 (×12): 3 g via INTRAVENOUS
  Filled 2015-12-31 (×15): qty 3

## 2015-12-31 MED ORDER — CLONAZEPAM 0.5 MG PO TABS
0.5000 mg | ORAL_TABLET | Freq: Two times a day (BID) | ORAL | Status: DC
  Administered 2015-12-31 (×2): 0.5 mg via ORAL
  Filled 2015-12-31 (×3): qty 1

## 2015-12-31 MED ORDER — IBUPROFEN 100 MG/5ML PO SUSP
400.0000 mg | Freq: Four times a day (QID) | ORAL | Status: DC | PRN
Start: 2015-12-31 — End: 2016-01-07
  Administered 2015-12-31 – 2016-01-01 (×4): 400 mg
  Filled 2015-12-31 (×6): qty 20

## 2015-12-31 MED ORDER — POTASSIUM CHLORIDE 20 MEQ/15ML (10%) PO SOLN
40.0000 meq | Freq: Once | ORAL | Status: AC
  Administered 2015-12-31: 40 meq via ORAL
  Filled 2015-12-31: qty 30

## 2015-12-31 MED ORDER — ONDANSETRON HCL 4 MG/2ML IJ SOLN
4.0000 mg | Freq: Four times a day (QID) | INTRAMUSCULAR | Status: DC | PRN
  Administered 2016-01-05 – 2016-01-10 (×7): 4 mg via INTRAVENOUS
  Filled 2015-12-31 (×7): qty 2

## 2015-12-31 MED ORDER — DEXMEDETOMIDINE HCL IN NACL 400 MCG/100ML IV SOLN
0.4000 ug/kg/h | INTRAVENOUS | Status: DC
  Administered 2015-12-31: 1.2 ug/kg/h via INTRAVENOUS
  Administered 2015-12-31 (×2): 1.6 ug/kg/h via INTRAVENOUS
  Administered 2015-12-31: 0.4 ug/kg/h via INTRAVENOUS
  Administered 2015-12-31: 1.3 ug/kg/h via INTRAVENOUS
  Administered 2016-01-01: 1.7 ug/kg/h via INTRAVENOUS
  Administered 2016-01-01: 1.6 ug/kg/h via INTRAVENOUS
  Administered 2016-01-01: 1.5 ug/kg/h via INTRAVENOUS
  Administered 2016-01-01: 1.7 ug/kg/h via INTRAVENOUS
  Administered 2016-01-01 (×4): 2 ug/kg/h via INTRAVENOUS
  Administered 2016-01-01: 1.8 ug/kg/h via INTRAVENOUS
  Administered 2016-01-01: 1.5 ug/kg/h via INTRAVENOUS
  Administered 2016-01-01: 2 ug/kg/h via INTRAVENOUS
  Administered 2016-01-02: 2.3 ug/kg/h via INTRAVENOUS
  Administered 2016-01-02 (×9): 2.4 ug/kg/h via INTRAVENOUS
  Administered 2016-01-02: 2.3 ug/kg/h via INTRAVENOUS
  Administered 2016-01-02 (×3): 2.4 ug/kg/h via INTRAVENOUS
  Administered 2016-01-03: 1.4 ug/kg/h via INTRAVENOUS
  Administered 2016-01-03 (×2): 1.8 ug/kg/h via INTRAVENOUS
  Administered 2016-01-03: 2.2 ug/kg/h via INTRAVENOUS
  Administered 2016-01-03: 2.4 ug/kg/h via INTRAVENOUS
  Administered 2016-01-03: 1.1 ug/kg/h via INTRAVENOUS
  Administered 2016-01-03: 1.8 ug/kg/h via INTRAVENOUS
  Administered 2016-01-03: 1.1 ug/kg/h via INTRAVENOUS
  Administered 2016-01-03: 1.8 ug/kg/h via INTRAVENOUS
  Administered 2016-01-03: 1.1 ug/kg/h via INTRAVENOUS
  Administered 2016-01-04: 1.6 ug/kg/h via INTRAVENOUS
  Administered 2016-01-04: 1.8 ug/kg/h via INTRAVENOUS
  Administered 2016-01-04: 1.6 ug/kg/h via INTRAVENOUS
  Administered 2016-01-04: 1.8 ug/kg/h via INTRAVENOUS
  Filled 2015-12-31: qty 100
  Filled 2015-12-31: qty 50
  Filled 2015-12-31 (×19): qty 100
  Filled 2015-12-31: qty 200
  Filled 2015-12-31: qty 100
  Filled 2015-12-31: qty 200
  Filled 2015-12-31 (×3): qty 100
  Filled 2015-12-31: qty 200
  Filled 2015-12-31 (×6): qty 100
  Filled 2015-12-31: qty 200
  Filled 2015-12-31: qty 100
  Filled 2015-12-31: qty 200
  Filled 2015-12-31: qty 100
  Filled 2015-12-31: qty 50
  Filled 2015-12-31 (×3): qty 100

## 2015-12-31 MED ORDER — VITAL HIGH PROTEIN PO LIQD
1000.0000 mL | ORAL | Status: DC
  Administered 2015-12-31 – 2016-01-01 (×3): 1000 mL
  Filled 2015-12-31 (×3): qty 1000

## 2015-12-31 MED ORDER — POTASSIUM CHLORIDE 20 MEQ/15ML (10%) PO SOLN
20.0000 meq | ORAL | Status: AC
  Administered 2015-12-31 (×2): 20 meq
  Filled 2015-12-31 (×2): qty 15

## 2015-12-31 NOTE — Progress Notes (Signed)
[facility]OC[facility] FOR AM LAB REPLACEMENT ONLY  The patient does apply for the [facility] Electrolyte [facility] Protocol based on the criteria listed below:   1. Is GFR >/= 40 ml/min? Yes.    Patient's GFR today is >60 2. Is urine output >/= 0.5 ml/kg/hr for the last 6 hours? Yes.   Patient's UOP is 0.5 ml/kg/hr 3. Is BUN < 60 mg/dL? Yes.    Patient's BUN today is 9 4. Abnormal electrolyte(s): K 3.3 5. Ordered repletion with: per protocol 6. If a panic level lab has been reported, has the CCM MD in charge been notified? No..   Physician:    Markus DaftWHELAN, Charnice Zwilling A 12/31/2015 6:50 AM

## 2015-12-31 NOTE — Progress Notes (Signed)
eLink Physician-Brief Progress Note Patient Name: Stacie Sanders DOB: [birth year] MRN: [id number]   Date of Service  12/31/2015  HPI/Events of Note  RN notified patient acutely combative. On Precedex at 1.2 & just received Fentanyl IV. Seems to be calming down some.  eICU Interventions  Increase Precedex gtt max to 1.6     Intervention Category Major Interventions: Delirium, psychosis, severe agitation - evaluation and management  Lawanda CousinsJennings Shahin Knierim 12/31/2015, 6:55 PM

## 2015-12-31 NOTE — Progress Notes (Signed)
Utilization review completed.  

## 2015-12-31 NOTE — Progress Notes (Signed)
Initial Nutrition Assessment  DOCUMENTATION CODES:   Obesity unspecified  INTERVENTION:   Initiate Vital HP @ 20 ml/hr via OGT and increase by 10 ml every 4 hours to goal rate of 40 ml/hr.   30 ml Prostat TID.    Tube feeding regimen provides 1260 kcal (56% of needs), 129 grams of protein, and 803 ml of H2O.   RD to continue to monitor  NUTRITION DIAGNOSIS:   Inadequate oral intake related to inability to eat as evidenced by NPO status.  GOAL:   Provide needs based on ASPEN/SCCM guidelines  MONITOR:   Vent status, Labs, Weight trends, TF tolerance, Skin, I & O's  REASON FOR ASSESSMENT:   Consult, Ventilator Enteral/tube feeding initiation and management  ASSESSMENT:   45F with history of depression and anxiety with outpatient prescriptions for Klonopin and Lithium who [facility] after an intentional overdose of benzodiazepines (klonopin, xanax) and alcohol. Reportedly she consumed 20 xanax, 12 Klonopin, 6 shots, 8-9 beers and then reported to the ED. She reportedly has been non-compliant with her lithium. It is unclear if she was taking seroquel. There was also some concern for benadryl or other anti-cholinergic substance. After arrival in the ED she became progressively more obtunded and required intubation  Pt remains on vent d/t agitation, may extubate. Will place orders for tube feeding if unable to extubate.  Patient is currently intubated on ventilator support MV: 11.5 L/min Temp (24hrs), Avg:100.5 F (38.1 C), Min:100 F (37.8 C), Max:100.8 F (38.2 C)  Propofol: none  Labs reviewed: Low K Mg/Phos WNL  Diet Order:  Diet NPO time specified  Skin:  Reviewed, no issues  Last BM:  PTA  Height:   Ht Readings from Last 1 Encounters:  12/29/15 5\' 8"  (1.727 m)    Weight:   Wt Readings from Last 1 Encounters:  12/31/15 239 lb 13.8 oz (108.8 kg)    Ideal Body Weight:  63.6 kg  BMI:  Body mass index is 36.48  kg/(m^2).  Estimated Nutritional Needs:   Kcal:  [id number]  Protein:  120-130g  Fluid:  2L/day  EDUCATION NEEDS:   No education needs identified at this time  Tilda FrancoLindsey Joycie Aerts, MS, RD, LDN Pager: [phone] After Hours Pager: [phone]

## 2015-12-31 NOTE — Progress Notes (Signed)
eLink Physician-Brief Progress Note Patient Name: Andre Lefortracy L Dearing DOB: 1971-06-07 MRN: [id number]   Date of Service  12/31/2015  HPI/Events of Note  RN notified patient now combative again & kicking at staff.  eICU Interventions  Start FEntanyl gtt & 4 point restraints. Continue to maximize precedex gtt.     Intervention Category Major Interventions: Change in mental status - evaluation and management  Lawanda CousinsJennings Nestor 12/31/2015, 8:31 PM

## 2015-12-31 NOTE — Progress Notes (Signed)
PULMONARY / CRITICAL CARE MEDICINE   Name: Stacie Sanders MRN: [id number] DOB: 10/24/1971    ADMISSION DATE:  12/29/2015 CONSULTATION DATE:  12/29/15  REFERRING MD:  Nicanor Alcon  CHIEF COMPLAINT:  Drug overdose  STUDIES:  Port CXR 12/31:  Personally reviewed by me. Streaky opacity in right lower and left lower lung. ETT 3cm above carina.  MICROBIOLOGY: Tracheal Aspirate 12/31>> GPC in pairs Blood Ctx x2 12/31>> Urine Ctx 12/31>>  ANTIBIOTICS: Vancomycin 12/31>> 1/2 Zosyn 12/31>> 1/2  Unasyn 1/2 >   SIGNIFICANT EVENTS: 12/31 - Admit & Intubated by EDP  LINES/TUBES: ETT 12/29/15 > Foley 12/29/15 >  SUBJECTIVE: Markedly agitated this morning, tachycardic, tachypneic  VITAL SIGNS: BP 167/114 mmHg  Pulse 109  Temp(Src) 100.6 F (38.1 C) (Core (Comment))  Resp 19  Ht 5\' 8"  (1.727 m)  Wt 108.8 kg (239 lb 13.8 oz)  BMI 36.48 kg/m2  SpO2 96%  LMP   HEMODYNAMICS:    VENTILATOR SETTINGS: Vent Mode:  [-] PRVC FiO2 (%):  [40 %] 40 % Set Rate:  [25 bmp] 25 bmp Vt Set:  [450 mL] 450 mL PEEP:  [5 cmH20] 5 cmH20 Plateau Pressure:  [18 cmH20-21 cmH20] 20 cmH20  INTAKE / OUTPUT: I/O last 3 completed shifts: In: 4875.9 [I.V.:3535.9; NG/GT:90; IV Piggyback:1250] Out: 2302 [Urine:1677; Emesis/NG output:625]  PHYSICAL EXAMINATION: General:  Marked agitation HENT: NCAT ETT in place PULM: no wheezing, vent supported breaths, increased work of breathing CV: Tachycardic, regular, no mgr GI: BS+, soft, nontender MSK: normal bulk and tone Derm: red, diaphoretic Neuro: Agitated, follows some commands  LABS:  BMET  Recent Labs Lab 12/29/15 0038 12/30/15 0353 12/31/15 0331  NA 145 142 145  K 3.8 3.4* 3.3*  CL 107 106 110  CO2  --  24 23  BUN 4* 8 9  CREATININE 0.90 0.71 0.84  GLUCOSE 114* 135* 131*    Electrolytes  Recent Labs Lab 12/30/15 0353 12/31/15 0331  CALCIUM 8.6* 9.0  MG 1.7  --   PHOS 2.2* 2.5    CBC  Recent Labs Lab 12/29/15 0028  12/29/15 0038 12/30/15 0353 12/31/15 0331  WBC 15.3*  --  19.2* 16.6*  HGB 15.6* 17.7* 14.7 13.3  HCT 47.7* 52.0* 45.1 41.2  PLT 354  --  346 335    Coag's No results for input(s): APTT, INR in the last 168 hours.  Sepsis Markers  Recent Labs Lab 12/29/15 1201 12/29/15 1818 12/29/15 2327 12/30/15 0353 12/31/15 0331  LATICACIDVEN 2.8* 1.8 1.7  --   --   PROCALCITON 0.10  --   --  <0.10 <0.10    ABG  Recent Labs Lab 12/29/15 0203 12/29/15 0359  PHART 7.279* 7.259*  PCO2ART 43.9 50.6*  PO2ART 93.3 182*    Liver Enzymes  Recent Labs Lab 12/29/15 0028 12/30/15 0353 12/31/15 0331  AST 41  --   --   ALT 52  --   --   ALKPHOS 122  --   --   BILITOT 0.5  --   --   ALBUMIN 4.1 3.5 3.4*    Cardiac Enzymes No results for input(s): TROPONINI, PROBNP in the last 168 hours.  Glucose No results for input(s): GLUCAP in the last 168 hours.  Imaging  12/31 CXR images personally reviewed> ETT in place, bibasilar atelectasis  ASSESSMENT / PLAN:  PULMONARY A: Acute Hypoxic Respiratory Failure > not clear she needs vent at this point, mostly agitation keeping her on vent Aspiration Pneumonitis   P:  Continue to wean FiO2 for Sat >92% Wean on PSV when more calm Change sedation, see below Repeat CXR > is there pneumonia? See ID section  CARDIOVASCULAR A:  Sinus tachycardia   P:  Monitor patient on telemetry Vitals per unit protocol  RENAL A:   Hypokalemia  UTI/Pyuria ? > no U/A resulted this admission  P:   KCl 40mEq again today Hold IVF Monitoring UOP with foley catheter Trending renal function daily with BUN/Creatinine  GASTROINTESTINAL A:   Nausea/vomiting P:   NPO Zantac bid zofran prn Start tube feedings today if not extubated  HEMATOLOGIC A:   No acute issues  P:  Monitor for bleeding Lovenox [place] daily  SCDs  INFECTIOUS A: HCAP UTI? Fever> benzo withdrawal related? P:   Vancomycin & Zosyn > d/c F/u cultures Send  U/A Start Unasyn Check CXR for infiltrate  ENDOCRINE A:   Hyperglycemia - No h/o DM    P:   Checking Hgb A1c Accu-Checks q4hr Low dose SSI per algorithm  NEUROLOGIC A:   Benzo overdose  Now with marked agitation> benzo withdrawal? Chronic benzodiazepine abuse Suicide Attempt EtOH Use  P:   RASS goal: -1 Monitor for seizure activity Change propofol to precedex Add clonazepam bid Fentanyl IV prn Psychiatry consult once patient extubated & suicide precautions  PSYCHIATRIC A: Suicide Attempt with Overdose H/O Depression/Anxiety  P: Suicide precautions and psych consult once extubated.    FAMILY  - Updates: Husband updated 1/2 AM by me  - Inter-disciplinary family meet or Palliative Care meeting due by:  01/04/16  My cc time 40 minutes  Heber CarolinaBrent Sanita Estrada, MD [place] PCCM Pager: [phone] Cell: [phone] After 3pm or if no response, call [phone]  12/31/2015 @ 10:23 AM

## 2015-12-31 NOTE — Progress Notes (Addendum)
ANTIBIOTIC CONSULT NOTE - INITIAL  Pharmacy Consult for Unasyn Indication: aspiration pneumonia  Allergies  Allergen Reactions  . Fentanyl Itching  . Percocet [Oxycodone-Acetaminophen] Hives and Itching    Patient Measurements: Height: 5\' 8"  (172.7 cm) Weight: 239 lb 13.8 oz (108.8 kg) IBW/kg (Calculated) : 63.9   Vital Signs: Temp: 100.6 F (38.1 C) (01/02 0800) Temp Source: Core (Comment) (01/02 0800) BP: 167/114 mmHg (01/02 0800) Pulse Rate: 109 (01/02 0800) Intake/Output from previous day: 01/01 0701 - 01/02 0700 In: 3090.1 [I.V.:2310.1; NG/GT:30; IV Piggyback:750] Out: 1060 [Urine:910; Emesis/NG output:150] Intake/Output from this shift: Total I/O In: 135 [I.V.:75; NG/GT:60] Out: 100 [Urine:100]  Labs:  Recent Labs  12/29/15 0028 12/29/15 0038 12/30/15 0353 12/31/15 0331  WBC 15.3*  --  19.2* 16.6*  HGB 15.6* 17.7* 14.7 13.3  PLT 354  --  346 335  CREATININE  --  0.90 0.71 0.84   Estimated Creatinine Clearance: 112.8 mL/min (by C-G formula based on Cr of 0.84). No results for input(s): VANCOTROUGH, VANCOPEAK, VANCORANDOM, GENTTROUGH, GENTPEAK, GENTRANDOM, TOBRATROUGH, TOBRAPEAK, TOBRARND, AMIKACINPEAK, AMIKACINTROU, AMIKACIN in the last 72 hours.   Microbiology: Recent Results (from the past 720 hour(s))  MRSA PCR Screening     Status: None   Collection Time: 12/29/15 10:23 AM  Result Value Ref Range Status   MRSA by PCR NEGATIVE NEGATIVE Final    Comment:        The GeneXpert MRSA Assay (FDA approved for NASAL specimens only), is one component of a comprehensive MRSA colonization surveillance program. It is not intended to diagnose MRSA infection nor to guide or monitor treatment for MRSA infections.   Culture, Urine     Status: None   Collection Time: 12/29/15 11:26 AM  Result Value Ref Range Status   Specimen Description URINE, CATHETERIZED  Final   Special Requests NONE  Final   Culture   Final    NO GROWTH 1 DAY Performed at [facility]    Report Status 12/30/2015 FINAL  Final  Culture, respiratory (NON-Expectorated)     Status: None (Preliminary result)   Collection Time: 12/29/15 11:27 AM  Result Value Ref Range Status   Specimen Description TRACHEAL ASPIRATE  Final   Special Requests NONE  Final   Gram Stain   Final    NO WBC SEEN NO SQUAMOUS EPITHELIAL CELLS SEEN MODERATE GRAM POSITIVE COCCI IN PAIRS Performed at [facility]    Culture PENDING  Incomplete   Report Status PENDING  Incomplete  Culture, blood (routine x 2)     Status: None (Preliminary result)   Collection Time: 12/29/15 12:01 PM  Result Value Ref Range Status   Specimen Description BLOOD RIGHT HAND  Final   Special Requests BOTTLES DRAWN AEROBIC ONLY 5CC  Final   Culture   Final    NO GROWTH < 24 HOURS Performed at [facility]    Report Status PENDING  Incomplete  Culture, blood (routine x 2)     Status: None (Preliminary result)   Collection Time: 12/29/15 12:13 PM  Result Value Ref Range Status   Specimen Description BLOOD RIGHT HAND  Final   Special Requests IN PEDIATRIC BOTTLE 1CC  Final   Culture   Final    NO GROWTH < 24 HOURS Performed at [facility]    Report Status PENDING  Incomplete    Medical History: Past Medical History  Diagnosis Date  . Depression   . Kidney stone    Assessment: Patient's a  45 y.o F presented to the ED on 12/31 for drug overdose. Initially started on vancomycin and zosyn for suspected aspiration PNA. Today, 1/2, narrowing spectrum to Unasyn.  Antimicrobials this admission: 12/31 vanc>> 1/2 12/31 zosyn>> 1/2 1/2 >> unasyn >>  Microbiology Results: 12/31 BCx: ngtd 12/31 UCx: NGF 12/31 Sputum: moderate GPC pairs  12/31 MRSA PCR: negative  Today, Day 3 abx: Tmax: 100.8 WBC: elevated but improved 16.6 Renal: SCr wnl and stable, CrCl > 100 ml/min PCT > 0.10 Lactate: trending down 2.81 > 2.8 > 1.7  Goal of Therapy:  Antibiotic dosing appropriate for  indication and renal function  Plan:   Unasyn 3g IV q6h Follow up renal function & cultures  Loralee PacasErin Karlisha Mathena, PharmD, BCPS Pager: [phone] 12/31/2015,10:30 AM

## 2016-01-01 ENCOUNTER — Inpatient Hospital Stay (HOSPITAL_COMMUNITY): Payer: Medicaid Other

## 2016-01-01 DIAGNOSIS — F10231 Alcohol dependence with withdrawal delirium: Secondary | ICD-10-CM

## 2016-01-01 LAB — CBC WITH DIFFERENTIAL/PLATELET
BASOS ABS: 0 10*3/uL (ref 0.0–0.1)
Basophils Relative: 0 %
Eosinophils Absolute: 0.2 10*3/uL (ref 0.0–0.7)
Eosinophils Relative: 1 %
HEMATOCRIT: 42.1 % (ref 36.0–46.0)
HEMOGLOBIN: 13.2 g/dL (ref 12.0–15.0)
LYMPHS PCT: 21 %
Lymphs Abs: 2.7 10*3/uL (ref 0.7–4.0)
MCH: 32 pg (ref 26.0–34.0)
MCHC: 31.4 g/dL (ref 30.0–36.0)
MCV: 101.9 fL — AB (ref 78.0–100.0)
Monocytes Absolute: 0.9 10*3/uL (ref 0.1–1.0)
Monocytes Relative: 7 %
NEUTROS ABS: 9.3 10*3/uL — AB (ref 1.7–7.7)
Neutrophils Relative %: 71 %
Platelets: 269 10*3/uL (ref 150–400)
RBC: 4.13 MIL/uL (ref 3.87–5.11)
RDW: 13.4 % (ref 11.5–15.5)
WBC: 13 10*3/uL — AB (ref 4.0–10.5)

## 2016-01-01 LAB — RENAL FUNCTION PANEL
ALBUMIN: 3.3 g/dL — AB (ref 3.5–5.0)
ANION GAP: 10 (ref 5–15)
BUN: 13 mg/dL (ref 6–20)
CO2: 22 mmol/L (ref 22–32)
Calcium: 8.6 mg/dL — ABNORMAL LOW (ref 8.9–10.3)
Chloride: 115 mmol/L — ABNORMAL HIGH (ref 101–111)
Creatinine, Ser: 0.97 mg/dL (ref 0.44–1.00)
GFR calc Af Amer: >60 mL/min (ref >60)
GLUCOSE: 150 mg/dL — AB (ref 65–99)
PHOSPHORUS: 3 mg/dL (ref 2.5–4.6)
POTASSIUM: 3.8 mmol/L (ref 3.5–5.1)
Sodium: 147 mmol/L — ABNORMAL HIGH (ref 135–145)

## 2016-01-01 LAB — BLOOD GAS, ARTERIAL
Acid-base deficit: 2.3 mmol/L — ABNORMAL HIGH (ref 0.0–2.0)
Bicarbonate: 22.2 mEq/L (ref 20.0–24.0)
DRAWN BY: 441261
FIO2: 1
MECHVT: 510 mL
O2 Saturation: 99.1 %
PEEP: 5 cmH2O
PH ART: 7.372 (ref 7.350–7.450)
PO2 ART: 190 mmHg — AB (ref 80.0–100.0)
Patient temperature: 98.6
RATE: 14 resp/min
TCO2: 20 mmol/L (ref 0–100)
pCO2 arterial: 39.1 mmHg (ref 35.0–45.0)

## 2016-01-01 LAB — CULTURE, RESPIRATORY W GRAM STAIN

## 2016-01-01 LAB — GLUCOSE, CAPILLARY
GLUCOSE-CAPILLARY: 129 mg/dL — AB (ref 65–99)
GLUCOSE-CAPILLARY: 124 mg/dL — AB (ref 65–99)
Glucose-Capillary: 136 mg/dL — ABNORMAL HIGH (ref 65–99)
Glucose-Capillary: 141 mg/dL — ABNORMAL HIGH (ref 65–99)
Glucose-Capillary: 155 mg/dL — ABNORMAL HIGH (ref 65–99)
Glucose-Capillary: 165 mg/dL — ABNORMAL HIGH (ref 65–99)

## 2016-01-01 LAB — CULTURE, RESPIRATORY
CULTURE: NORMAL
GRAM STAIN: NONE SEEN

## 2016-01-01 LAB — TRIGLYCERIDES: Triglycerides: 409 mg/dL — ABNORMAL HIGH (ref <150)

## 2016-01-01 MED ORDER — MIDAZOLAM HCL 2 MG/2ML IJ SOLN
1.0000 mg | INTRAMUSCULAR | Status: DC | PRN
  Administered 2016-01-01 – 2016-01-03 (×6): 1 mg via INTRAVENOUS
  Filled 2016-01-01 (×8): qty 2

## 2016-01-01 MED ORDER — MIDAZOLAM HCL 2 MG/2ML IJ SOLN: 4.0000 mg | Freq: Once | INTRAMUSCULAR | Status: AC

## 2016-01-01 MED ORDER — FENTANYL CITRATE (PF) 100 MCG/2ML IJ SOLN
INTRAMUSCULAR | Status: AC
  Filled 2016-01-01: qty 4

## 2016-01-01 MED ORDER — FENTANYL CITRATE (PF) 2500 MCG/50ML IJ SOLN
25.0000 ug/h | INTRAMUSCULAR | Status: DC
  Filled 2016-01-01: qty 50

## 2016-01-01 MED ORDER — CETYLPYRIDINIUM CHLORIDE 0.05 % MT LIQD
7.0000 mL | Freq: Two times a day (BID) | OROMUCOSAL | Status: DC
  Administered 2016-01-01: 7 mL via OROMUCOSAL

## 2016-01-01 MED ORDER — INSULIN ASPART 100 UNIT/ML ~~LOC~~ SOLN
0.0000 [IU] | SUBCUTANEOUS | Status: DC
  Administered 2016-01-01 – 2016-01-02 (×5): 1 [IU] via SUBCUTANEOUS
  Administered 2016-01-02: 2 [IU] via SUBCUTANEOUS
  Administered 2016-01-03 (×2): 1 [IU] via SUBCUTANEOUS
  Administered 2016-01-03: 2 [IU] via SUBCUTANEOUS
  Administered 2016-01-03 – 2016-01-05 (×9): 1 [IU] via SUBCUTANEOUS
  Administered 2016-01-05 – 2016-01-06 (×3): 2 [IU] via SUBCUTANEOUS
  Administered 2016-01-06 – 2016-01-07 (×5): 1 [IU] via SUBCUTANEOUS
  Administered 2016-01-07: 2 [IU] via SUBCUTANEOUS
  Administered 2016-01-07: 1 [IU] via SUBCUTANEOUS

## 2016-01-01 MED ORDER — FENTANYL BOLUS VIA INFUSION
50.0000 ug | INTRAVENOUS | Status: DC | PRN
  Administered 2016-01-01 – 2016-01-02 (×7): 50 ug via INTRAVENOUS
  Filled 2016-01-01: qty 50

## 2016-01-01 MED ORDER — ANTISEPTIC ORAL RINSE SOLUTION (CORINZ)
7.0000 mL | Freq: Four times a day (QID) | OROMUCOSAL | Status: DC
  Administered 2016-01-01 – 2016-01-02 (×4): 7 mL via OROMUCOSAL

## 2016-01-01 MED ORDER — MIDAZOLAM HCL 2 MG/2ML IJ SOLN
INTRAMUSCULAR | Status: AC
  Administered 2016-01-01: 4 mg
  Filled 2016-01-01: qty 4

## 2016-01-01 MED ORDER — FAMOTIDINE IN NACL 20-0.9 MG/50ML-% IV SOLN
20.0000 mg | Freq: Two times a day (BID) | INTRAVENOUS | Status: DC
  Administered 2016-01-01 – 2016-01-07 (×13): 20 mg via INTRAVENOUS
  Filled 2016-01-01 (×14): qty 50

## 2016-01-01 MED ORDER — DEXAMETHASONE SODIUM PHOSPHATE 4 MG/ML IJ SOLN
4.0000 mg | Freq: Four times a day (QID) | INTRAMUSCULAR | Status: AC
  Administered 2016-01-01 – 2016-01-02 (×6): 4 mg via INTRAVENOUS
  Filled 2016-01-01 (×6): qty 1

## 2016-01-01 MED ORDER — CHLORHEXIDINE GLUCONATE 0.12% ORAL RINSE (MEDLINE KIT)
15.0000 mL | Freq: Two times a day (BID) | OROMUCOSAL | Status: DC
Start: 2016-01-01 — End: 2016-01-05
  Administered 2016-01-01 – 2016-01-05 (×8): 15 mL via OROMUCOSAL

## 2016-01-01 MED ORDER — CLONAZEPAM 0.5 MG PO TABS
0.5000 mg | ORAL_TABLET | Freq: Two times a day (BID) | ORAL | Status: DC
Start: 2016-01-01 — End: 2016-01-13
  Administered 2016-01-01 – 2016-01-12 (×23): 0.5 mg via ORAL
  Filled 2016-01-01 (×24): qty 1

## 2016-01-01 MED ORDER — MIDAZOLAM HCL 2 MG/2ML IJ SOLN: 1.0000 mg | INTRAMUSCULAR | Status: DC | PRN

## 2016-01-01 MED ORDER — FENTANYL CITRATE (PF) 100 MCG/2ML IJ SOLN
50.0000 ug | Freq: Once | INTRAMUSCULAR | Status: AC
  Administered 2016-01-01: 50 ug via INTRAVENOUS
  Filled 2016-01-01: qty 2

## 2016-01-01 MED ORDER — CHLORHEXIDINE GLUCONATE 0.12 % MT SOLN
15.0000 mL | Freq: Two times a day (BID) | OROMUCOSAL | Status: DC
  Administered 2016-01-01: 15 mL via OROMUCOSAL

## 2016-01-01 MED ORDER — SODIUM CHLORIDE 0.9 % IV SOLN
25.0000 ug/h | INTRAVENOUS | Status: DC
  Administered 2016-01-01: 200 ug/h via INTRAVENOUS
  Administered 2016-01-01: 50 ug/h via INTRAVENOUS
  Administered 2016-01-02: 400 ug/h via INTRAVENOUS
  Administered 2016-01-02 (×2): 350 ug/h via INTRAVENOUS
  Administered 2016-01-03 (×2): 200 ug/h via INTRAVENOUS
  Filled 2016-01-01 (×7): qty 50

## 2016-01-01 NOTE — Progress Notes (Signed)
Patient had Clonazepam ordered at 2200.  When the pill was crushed and placed into the cup to prepare for administration, it was seen that the medication had been contaminated by paper.  This medication was wasted down the sink with Kevan Nyheryl Denny, RN as a witness.  This waste was documented in the pyxis.  Another Clonazepam was taken from the pyxis and administered to the patient.

## 2016-01-01 NOTE — Progress Notes (Signed)
Patient's tube feeds were on and off most of the night. Patient was combative and restless throughout the night. OG tube was seen to have moved, and upon checking for placement no sounds were auscultated.  The tube was advanced, and a KUB was ordered.  Placement was verified by x-ray and auscultation.  Permission to use was given via ELink MD. Feeds were restarted once patient's combative and restless status was controlled.

## 2016-01-01 NOTE — Progress Notes (Signed)
Patient placed on PSV/CPAP 5/+5. Patient vitals stable and patient comfortable. RN aware of mode change. RT will continue to monitor patient.

## 2016-01-01 NOTE — Progress Notes (Signed)
[facility] PCCM  Extubated successfully Has some stridor Mild increase work of breathing O2 saturation OK  Monitor closely in ICU> may need re-intubation Humidified Air/Face tent now Decadron now  Heber CarolinaBrent McQuaid, MD [place] PCCM Pager: [phone] Cell: [phone] After 3pm or if no response, call [phone]

## 2016-01-01 NOTE — Progress Notes (Signed)
100 mg rocuronium bromide IV and Amidate 20 mg IV given for intubation per verbal order from Dr. Kendrick FriesMcquaid.

## 2016-01-01 NOTE — Progress Notes (Signed)
eLink Physician-Brief Progress Note Patient Name: Stacie Sanders DOB: Jun 12, 1971 MRN: [id number]   Date of Service  01/01/2016  HPI/Events of Note  Agitation reported, although she is calm on my current eval on 170 fentanyl / h  eICU Interventions  Increased ceiling on precedex, added versed prn.      Intervention Category Major Interventions: Delirium, psychosis, severe agitation - evaluation and management  Qaadir Kent S. 01/01/2016, 3:54 AM

## 2016-01-01 NOTE — Progress Notes (Signed)
PULMONARY / CRITICAL CARE MEDICINE   Name: Stacie Sanders MRN: [id number] DOB: 12-25-1971    ADMISSION DATE:  12/29/2015 CONSULTATION DATE:  12/29/15  REFERRING MD:  Nicanor AlconPalumbo  CHIEF COMPLAINT:  Drug overdose  STUDIES:  Port CXR 12/31:  Personally reviewed by me. Streaky opacity in right lower and left lower lung. ETT 3cm above carina.  MICROBIOLOGY: Tracheal Aspirate 12/31>> OPF Blood Cx x2 12/31>> Urine Cx 12/31>>  ANTIBIOTICS: Vancomycin 12/31>> 1/2 Zosyn 12/31>> 1/2  Unasyn 1/2 >   SIGNIFICANT EVENTS: 12/31 - Admit & Intubated by EDP  LINES/TUBES: ETT 12/29/15 > 1/3 Foley 12/29/15 >  SUBJECTIVE: Combative overnight, family reported EtOH abuse  VITAL SIGNS: BP 137/94 mmHg  Pulse 73  Temp(Src) 102.7 F (39.3 C) (Core (Comment))  Resp 12  Ht 5\' 8"  (1.727 m)  Wt 106.7 kg (235 lb 3.7 oz)  BMI 35.78 kg/m2  SpO2 95%  LMP   HEMODYNAMICS:    VENTILATOR SETTINGS: Vent Mode:  [-] PRVC FiO2 (%):  [30 %] 30 % Set Rate:  [14 bmp-25 bmp] 14 bmp Vt Set:  [450 mL-510 mL] 510 mL PEEP:  [5 cmH20] 5 cmH20 Pressure Support:  [5 cmH20-12 cmH20] 5 cmH20 Plateau Pressure:  [18 cmH20-27 cmH20] 18 cmH20  INTAKE / OUTPUT: I/O last 3 completed shifts: In: 4657.5 [I.V.:3526.2; Other:60; NG/GT:171.3; IV Piggyback:900] Out: 1910 [Urine:1610; Emesis/NG output:300]  PHYSICAL EXAMINATION: General:  Calm, on pressure support HENT: NCAT ETT in place PULM: CTA B, vent supported breaths CV: RRR, no mgr GI: BS+, soft, nontender MSK: normal bulk and tone Derm: red, diaphoretic Neuro: Sedated on vent, withdraws to pain  LABS:  BMET  Recent Labs Lab 12/30/15 0353 12/31/15 0331 01/01/16 0115  NA 142 145 147*  K 3.4* 3.3* 3.8  CL 106 110 115*  CO2 24 23 22   BUN 8 9 13   CREATININE 0.71 0.84 0.97  GLUCOSE 135* 131* 150*    Electrolytes  Recent Labs Lab 12/30/15 0353 12/31/15 0331 01/01/16 0115  CALCIUM 8.6* 9.0 8.6*  MG 1.7  --   --   PHOS 2.2* 2.5 3.0     CBC  Recent Labs Lab 12/30/15 0353 12/31/15 0331 01/01/16 0115  WBC 19.2* 16.6* 13.0*  HGB 14.7 13.3 13.2  HCT 45.1 41.2 42.1  PLT 346 335 269    Coag's No results for input(s): APTT, INR in the last 168 hours.  Sepsis Markers  Recent Labs Lab 12/29/15 1201 12/29/15 1818 12/29/15 2327 12/30/15 0353 12/31/15 0331  LATICACIDVEN 2.8* 1.8 1.7  --   --   PROCALCITON 0.10  --   --  <0.10 <0.10    ABG  Recent Labs Lab 12/29/15 0203 12/29/15 0359  PHART 7.279* 7.259*  PCO2ART 43.9 50.6*  PO2ART 93.3 182*    Liver Enzymes  Recent Labs Lab 12/29/15 0028 12/30/15 0353 12/31/15 0331 01/01/16 0115  AST 41  --   --   --   ALT 52  --   --   --   ALKPHOS 122  --   --   --   BILITOT 0.5  --   --   --   ALBUMIN 4.1 3.5 3.4* 3.3*    Cardiac Enzymes No results for input(s): TROPONINI, PROBNP in the last 168 hours.  Glucose  Recent Labs Lab 12/31/15 1204 12/31/15 1651 12/31/15 2357 01/01/16 0414 01/01/16 0731  GLUCAP 124* 123* 141* 129* 136*    Imaging  1/3 CXR images personally reviewed> ETT in place, bibasilar atelectasis, no  clear infiltrate, rotated film  ASSESSMENT / PLAN:  PULMONARY A: Acute Hypoxic Respiratory Failure > resolved Aspiration Pneumonitis > better  P:   Extubate Maintain NPO until mental status improves Oral care protocol  CARDIOVASCULAR A:  Sinus tachycardia > resolved  P:  Monitor patient on telemetry Vitals per unit protocol  RENAL A:   Hypokalemia > improved  P:   KCl again today > now Monitor BMET and UOP Replace electrolytes as needed  GASTROINTESTINAL A:   Nausea/vomiting P:   NPO Zantac bid zofran prn  HEMATOLOGIC A:   No acute issues  P:  Monitor for bleeding Lovenox [place] daily  SCDs  INFECTIOUS A: HCAP/Aspiration pneumonia UTI Fever> benzo withdrawal related? P:   F/u cultures Unasyn > plan 5 total days of antibiotics  ENDOCRINE A:   Hyperglycemia - No h/o DM    P:    Checking Hgb A1c Accu-Checks q4hr Low dose SSI per algorithm  NEUROLOGIC A:   Benzo overdose  EtOH abuse Now with marked agitation> benzo/EtOH withdrawal Delirium Tremens Chronic benzodiazepine abuse Suicide Attempt  P:   RASS goal: 0 Monitor for seizure activity Use precedes for delirium tremems Continue clonazepam bid Prn versed D/C fentanyl Psychiatry consult once patient extubated & suicide precautions  PSYCHIATRIC A: Suicide Attempt with Overdose H/O Depression/Anxiety  P: Suicide precautions and psych consult once extubated   FAMILY  - Updates: Husband updated 1/2 AM by me  - Inter-disciplinary family meet or Palliative Care meeting due by:  01/04/16  My cc time 35 minutes  Heber Fruita, MD [place] P[facility]M Pager: [phone] Cell: [phone] After 3pm or if no response, call [phone]  01/01/2016 @ 8:51 AM

## 2016-01-01 NOTE — Progress Notes (Signed)
Nutrition Follow-up  DOCUMENTATION CODES:   Obesity unspecified  INTERVENTION:   Once medically able: Resume Vital HP @ 40 ml/hr.   30 ml Prostat TID.   Tube feeding regimen provides 1260 kcal (56% of needs), 129 grams of protein, and 803 ml of H2O.   RD to continue to monitor  NUTRITION DIAGNOSIS:   Inadequate oral intake related to inability to eat as evidenced by NPO status.  Ongoing.  GOAL:   Provide needs based on ASPEN/SCCM guidelines  Not meeting.  MONITOR:   Vent status, Labs, Weight trends, TF tolerance, Skin, I & O's  ASSESSMENT:   50F with history of depression and anxiety with outpatient prescriptions for Klonopin and Lithium who presented to the [facility] ED on 12/30 after an intentional overdose of benzodiazepines (klonopin, xanax) and alcohol. Reportedly she consumed 20 xanax, 12 Klonopin, 6 shots, 8-9 beers and then reported to the ED. She reportedly has been non-compliant with her lithium. It is unclear if she was taking seroquel. There was also some concern for benadryl or other anti-cholinergic substance. After arrival in the ED she became progressively more obtunded and required intubation  Extubation was attempted this AM but failed. Pt was re-intubated. Pt was having her tube feeding off and on last night per nursing note. Pt continues to be combative. Pt on paralytics.  Patient is currently intubated on ventilator support MV: 7.3 L/min Temp (24hrs), Avg:102.8 F (39.3 C), Min:101.1 F (38.4 C), Max:103.8 F (39.9 C)  Labs reviewed: CBGs: 124-136 Elevated Na Phos WNL  Diet Order:  Diet NPO time specified  Skin:  Reviewed, no issues  Last BM:  PTA  Height:   Ht Readings from Last 1 Encounters:  01/01/16 5\' 8"  (1.727 m)    Weight:   Wt Readings from Last 1 Encounters:  01/01/16 235 lb 3.7 oz (106.7 kg)    Ideal Body Weight:  63.6 kg  BMI:  Body mass index is 35.78 kg/(m^2).  Estimated Nutritional Needs:   Kcal:   [id number]  Protein:  120-130g  Fluid:  2L/day  EDUCATION NEEDS:   No education needs identified at this time  Stacie FrancoLindsey Vanderbilt Ranieri, MS, RD, LDN Pager: [phone] After Hours Pager: [phone]

## 2016-01-01 NOTE — Progress Notes (Signed)
[facility] PCCM  Failed BIPAP due to agitation Became progressively more hypoxemic, increased work of breathing with stridor  Re-intubated  Heber CarolinaBrent Elaina Cara, MD [place] P[facility]M Pager: [phone] Cell: [phone] After 3pm or if no response, call [phone]

## 2016-01-01 NOTE — Procedures (Addendum)
Intubation Procedure Note Stacie Sanders [id number] [birth year]  Procedure: Intubation Indications: Airway protection and maintenance  Procedure Details Consent: Risks of procedure as well as the alternatives and risks of each were explained to the (patient/caregiver).  Consent for procedure obtained. Time Out: Verified patient identification, verified procedure, site/side was marked, verified correct patient position, special equipment/implants available, medications/allergies/relevent history reviewed, required imaging and test results available.  Performed  Drugs Etomidate 20mg , Versed 4mg  IV, Rocuronium 100mg  IV DL x 1 with MAC 3 blade Grade 1 view 7.5 ET tube passed through cords under direct visualization > Red, very swollen, airway slit only THIS WAS NOT A DIFFICULT AIRWAY Placement confirmed with bilateral breath sounds, positive EtCO2 change and smoke in tube   Evaluation Hemodynamic Status: BP stable throughout; O2 sats: stable throughout Patient's Current Condition: stable Complications: No apparent complications Patient did tolerate procedure well. Chest X-ray ordered to verify placement.  CXR: pending.   Stacie Sanders 01/01/2016

## 2016-01-01 NOTE — Procedures (Signed)
Extubation Procedure Note  Patient Details:   Name: Stacie Sanders DOB: 06-Oct-1971 MRN: [id number]   Airway Documentation:  Airway 7.5 mm (Active)  Secured at (cm) 24 cm 01/01/2016  7:59 AM  Measured From Lips 01/01/2016  7:59 AM  Secured Location Left 01/01/2016  7:59 AM  Secured By [employer] Tube Holder 01/01/2016  7:59 AM  Tube Holder Repositioned Yes 01/01/2016  7:59 AM  Cuff Pressure (cm H2O) 24 cm H2O 12/31/2015  7:49 PM  Site Condition Dry 01/01/2016  7:59 AM    Evaluation  O2 sats: stable throughout Complications: No apparent complications Patient did tolerate procedure well. Bilateral Breath Sounds: Diminished Suctioning: Airway Yes  Dannielle KarvonenMegan K Kristy Schomburg 01/01/2016, 9:12 AM

## 2016-01-01 NOTE — Progress Notes (Signed)
60 mL of 250 mL bag of 1610mcg/mL fentanyl wasted in sink. Witnessed by Kandice HamsSierra Hoefler, RN

## 2016-01-02 ENCOUNTER — Encounter (HOSPITAL_COMMUNITY): Payer: Self-pay | Admitting: Radiology

## 2016-01-02 ENCOUNTER — Inpatient Hospital Stay (HOSPITAL_COMMUNITY): Payer: Medicaid Other

## 2016-01-02 ENCOUNTER — Inpatient Hospital Stay (HOSPITAL_COMMUNITY)
Admit: 2016-01-02 | Discharge: 2016-01-02 | Disposition: A | Discharge status: Home / Self Care | Payer: Medicaid Other | Attending: Neurology | Admitting: Neurology

## 2016-01-02 DIAGNOSIS — R509 Fever, unspecified: Secondary | ICD-10-CM

## 2016-01-02 DIAGNOSIS — G9340 Encephalopathy, unspecified: Secondary | ICD-10-CM | POA: Insufficient documentation

## 2016-01-02 LAB — RENAL FUNCTION PANEL
ALBUMIN: 3.4 g/dL — AB (ref 3.5–5.0)
ANION GAP: 9 (ref 5–15)
BUN: 19 mg/dL (ref 6–20)
CALCIUM: 8.7 mg/dL — AB (ref 8.9–10.3)
CO2: 25 mmol/L (ref 22–32)
Chloride: 115 mmol/L — ABNORMAL HIGH (ref 101–111)
Creatinine, Ser: 1.04 mg/dL — ABNORMAL HIGH (ref 0.44–1.00)
GFR calc non Af Amer: >60 mL/min (ref >60)
Glucose, Bld: 159 mg/dL — ABNORMAL HIGH (ref 65–99)
PHOSPHORUS: 1.6 mg/dL — AB (ref 2.5–4.6)
POTASSIUM: 4.2 mmol/L (ref 3.5–5.1)
Sodium: 149 mmol/L — ABNORMAL HIGH (ref 135–145)

## 2016-01-02 LAB — BASIC METABOLIC PANEL
ANION GAP: 9 (ref 5–15)
BUN: 18 mg/dL (ref 6–20)
CALCIUM: 8.7 mg/dL — AB (ref 8.9–10.3)
CHLORIDE: 116 mmol/L — AB (ref 101–111)
CO2: 24 mmol/L (ref 22–32)
Creatinine, Ser: 1.05 mg/dL — ABNORMAL HIGH (ref 0.44–1.00)
GFR calc non Af Amer: >60 mL/min (ref >60)
GLUCOSE: 157 mg/dL — AB (ref 65–99)
POTASSIUM: 4.2 mmol/L (ref 3.5–5.1)
Sodium: 149 mmol/L — ABNORMAL HIGH (ref 135–145)

## 2016-01-02 LAB — BLOOD GAS, ARTERIAL
ACID-BASE DEFICIT: 1.8 mmol/L (ref 0.0–2.0)
Bicarbonate: 22.1 mEq/L (ref 20.0–24.0)
DRAWN BY: 441261
FIO2: 0.5
MECHVT: 510 mL
O2 Saturation: 91.2 %
PATIENT TEMPERATURE: 106.5
PCO2 ART: 45.4 mmHg — AB (ref 35.0–45.0)
PEEP: 5 cmH2O
PO2 ART: 82 mmHg (ref 80.0–100.0)
RATE: 14 resp/min
TCO2: 20 mmol/L (ref 0–100)
pH, Arterial: 7.333 — ABNORMAL LOW (ref 7.350–7.450)

## 2016-01-02 LAB — INFLUENZA PANEL BY PCR (TYPE A & B)
H1N1 flu by pcr: NOT DETECTED
INFLBPCR: NEGATIVE
Influenza A By PCR: NEGATIVE

## 2016-01-02 LAB — CBC WITH DIFFERENTIAL/PLATELET
BASOS ABS: 0 10*3/uL (ref 0.0–0.1)
BASOS PCT: 0 %
Eosinophils Absolute: 0 10*3/uL (ref 0.0–0.7)
Eosinophils Relative: 0 %
HEMATOCRIT: 42.2 % (ref 36.0–46.0)
HEMOGLOBIN: 12.9 g/dL (ref 12.0–15.0)
LYMPHS PCT: 12 %
Lymphs Abs: 1.6 10*3/uL (ref 0.7–4.0)
MCH: 31.5 pg (ref 26.0–34.0)
MCHC: 30.6 g/dL (ref 30.0–36.0)
MCV: 102.9 fL — AB (ref 78.0–100.0)
MONO ABS: 0.7 10*3/uL (ref 0.1–1.0)
Monocytes Relative: 5 %
NEUTROS ABS: 11 10*3/uL — AB (ref 1.7–7.7)
NEUTROS PCT: 83 %
Platelets: 282 10*3/uL (ref 150–400)
RBC: 4.1 MIL/uL (ref 3.87–5.11)
RDW: 13.2 % (ref 11.5–15.5)
WBC: 13.3 10*3/uL — ABNORMAL HIGH (ref 4.0–10.5)

## 2016-01-02 LAB — AMMONIA: Ammonia: 42 umol/L — ABNORMAL HIGH (ref 9–35)

## 2016-01-02 LAB — URINALYSIS, ROUTINE W REFLEX MICROSCOPIC
BILIRUBIN URINE: NEGATIVE
Glucose, UA: NEGATIVE mg/dL
KETONES UR: NEGATIVE mg/dL
Leukocytes, UA: NEGATIVE
Nitrite: NEGATIVE
PROTEIN: 100 mg/dL — AB
Specific Gravity, Urine: 1.017 (ref 1.005–1.030)
pH: 6 (ref 5.0–8.0)

## 2016-01-02 LAB — URINE MICROSCOPIC-ADD ON

## 2016-01-02 LAB — GLUCOSE, CAPILLARY
GLUCOSE-CAPILLARY: 145 mg/dL — AB (ref 65–99)
GLUCOSE-CAPILLARY: 122 mg/dL — AB (ref 65–99)
GLUCOSE-CAPILLARY: 133 mg/dL — AB (ref 65–99)
Glucose-Capillary: 143 mg/dL — ABNORMAL HIGH (ref 65–99)
Glucose-Capillary: 147 mg/dL — ABNORMAL HIGH (ref 65–99)
Glucose-Capillary: 161 mg/dL — ABNORMAL HIGH (ref 65–99)

## 2016-01-02 LAB — URINE CULTURE
Culture: NO GROWTH
Special Requests: NORMAL

## 2016-01-02 LAB — TSH: TSH: 0.556 u[IU]/mL (ref 0.350–4.500)

## 2016-01-02 LAB — LACTIC ACID, PLASMA: LACTIC ACID, VENOUS: 0.9 mmol/L (ref 0.5–2.0)

## 2016-01-02 LAB — CK: CK TOTAL: 1156 U/L — AB (ref 38–234)

## 2016-01-02 LAB — LITHIUM LEVEL: Lithium Lvl: <0.06 mmol/L — ABNORMAL LOW (ref 0.60–1.20)

## 2016-01-02 MED ORDER — SENNOSIDES 8.8 MG/5ML PO SYRP
5.0000 mL | ORAL_SOLUTION | Freq: Every day | ORAL | Status: DC | PRN
  Filled 2016-01-02: qty 5

## 2016-01-02 MED ORDER — BISACODYL 5 MG PO TBEC
5.0000 mg | DELAYED_RELEASE_TABLET | Freq: Every day | ORAL | Status: DC | PRN
  Administered 2016-01-05: 5 mg via ORAL
  Filled 2016-01-02: qty 1

## 2016-01-02 MED ORDER — ANTISEPTIC ORAL RINSE SOLUTION (CORINZ)
7.0000 mL | OROMUCOSAL | Status: DC
  Administered 2016-01-03 – 2016-01-05 (×28): 7 mL via OROMUCOSAL

## 2016-01-02 MED ORDER — MIDAZOLAM HCL 2 MG/2ML IJ SOLN
INTRAMUSCULAR | Status: AC
  Administered 2016-01-02: 12:00:00
  Filled 2016-01-02: qty 2

## 2016-01-02 MED ORDER — SODIUM CHLORIDE 0.9 % IV SOLN
1000.0000 mg | Freq: Two times a day (BID) | INTRAVENOUS | Status: DC
  Administered 2016-01-03 – 2016-01-07 (×10): 1000 mg via INTRAVENOUS
  Filled 2016-01-02 (×12): qty 10

## 2016-01-02 MED ORDER — DOCUSATE SODIUM 50 MG/5ML PO LIQD
100.0000 mg | Freq: Two times a day (BID) | ORAL | Status: DC | PRN
  Administered 2016-01-02: 100 mg via ORAL
  Filled 2016-01-02 (×2): qty 10

## 2016-01-02 MED ORDER — ACETAMINOPHEN 650 MG RE SUPP
650.0000 mg | Freq: Four times a day (QID) | RECTAL | Status: DC | PRN
  Administered 2016-01-04: 650 mg via RECTAL
  Filled 2016-01-02: qty 1

## 2016-01-02 MED ORDER — ACETAMINOPHEN 650 MG RE SUPP
650.0000 mg | Freq: Three times a day (TID) | RECTAL | Status: DC | PRN
  Administered 2016-01-02: 650 mg via RECTAL
  Filled 2016-01-02: qty 1

## 2016-01-02 MED ORDER — SODIUM CHLORIDE 0.9 % IV SOLN
1000.0000 mg | Freq: Once | INTRAVENOUS | Status: AC
  Administered 2016-01-02: 1000 mg via INTRAVENOUS
  Filled 2016-01-02: qty 10

## 2016-01-02 MED ORDER — MIDAZOLAM HCL 2 MG/2ML IJ SOLN
INTRAMUSCULAR | Status: AC
  Administered 2016-01-02: 13:00:00
  Filled 2016-01-02: qty 2

## 2016-01-02 MED ORDER — DOCUSATE SODIUM 100 MG PO CAPS
100.0000 mg | ORAL_CAPSULE | Freq: Two times a day (BID) | ORAL | Status: DC | PRN
  Administered 2016-01-05: 100 mg via ORAL
  Filled 2016-01-02: qty 1

## 2016-01-02 MED ORDER — MIDAZOLAM HCL 2 MG/2ML IJ SOLN: 2.0000 mg | Freq: Once | INTRAMUSCULAR | Status: AC

## 2016-01-02 MED ORDER — SODIUM CHLORIDE 0.9 % IJ SOLN
10.0000 mL | Freq: Two times a day (BID) | INTRAMUSCULAR | Status: DC
  Administered 2016-01-02: 20 mL
  Administered 2016-01-02 – 2016-01-04 (×2): 10 mL
  Administered 2016-01-05 – 2016-01-07 (×2): 20 mL

## 2016-01-02 MED ORDER — POLYETHYLENE GLYCOL 3350 17 G PO PACK
17.0000 g | PACK | Freq: Every day | ORAL | Status: DC
  Administered 2016-01-02 – 2016-01-13 (×7): 17 g via ORAL
  Filled 2016-01-02 (×9): qty 1

## 2016-01-02 MED ORDER — SODIUM CHLORIDE 0.9 % IJ SOLN: 10.0000 mL | INTRAMUSCULAR | Status: DC | PRN

## 2016-01-02 NOTE — Progress Notes (Signed)
Report called to [place], Charity fundraiserN.  The [facility]. She will be transporting via [facility].

## 2016-01-02 NOTE — Procedures (Signed)
ELECTROENCEPHALOGRAM REPORT   Patient: Stacie Sanders        Age: 45 y.o.        Sex: female Referring Physician: Dr Kendrick FriesMcQuaid Report Date:  01/02/2016        Interpreting Physician: Omelia BlackwaterSUMNER, Nettie Wyffels JUSTIN  History: Stacie Sanders is an 45 y.o. female admitted with drug overdose, now with persistent encephalopathy, fever up to 106.7  Medications:  I have reviewed the patient's current medications. Sedated on vent on 21550mcg/hr of Fentanyl, received bolus of 50 of fentanyl and 1mg  of Versed  Conditions of Recording:  This is a 16 channel EEG carried out with the patient in the intubated and sedated state.   Description:  The background activity consists of a low to medium voltage, symmetrical, fairly well organized, mix of theta and delta activity, seen from the parieto-occipital and posterior temporal regions. There is frequent, around 0.5 to 1Hz , generalized sharp wave activity and occasional sharp and slow wave activity noted throughout the tracing. Some of the frontal activity appears to have a triphasic morphology.   Hyperventilation was not performed. Intermittent photic stimulation was not performed.  IMPRESSION: Abnormal EEG due to the following:  1)Generalized slowing indicating a moderate to severe cerebral disturbance (encephalopathy) 2)Continuous generalized sharp and slow wave activity throughout the recording. 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    Elspeth Choeter Clee Pandit, DO [facility] [phone]  If 7pm- 7am, please page neurology on call as listed in AMION. 01/02/2016, 12:30 PM

## 2016-01-02 NOTE — Progress Notes (Signed)
LTM EEG started 

## 2016-01-02 NOTE — Progress Notes (Signed)
   01/02/16 1644  Clinical Encounter Type  Visited With Family;Health care provider  Visit Type Follow-up  Referral From Family   Chaplain helped patient's husband and sister-in-law transition back to the patient's room. Chaplain offered support, and our support is available as needed.  Alda PonderAdam M Sterling Mondo, Chaplain 01/02/2016 4:45 PM

## 2016-01-02 NOTE — Progress Notes (Signed)
Date: January 02, 2016 Chart reviewed for concurrent status and case management needs. Will continue to follow patient for changes and needs: Neli Fofana Hilgers, RN, BSN, CCM   [phone] 

## 2016-01-02 NOTE — Progress Notes (Signed)
   01/02/16 1627  Clinical Encounter Type  Visited With Family;Health care provider  Visit Type Initial  Referral From Nurse   Chaplain met with patient's husband, Gerald Stabs, who arrived shortly after the patient who is a new admission on 3MW. Chaplain introduced spiritual care services,and offered support. Chaplain support available as needed.   Jeri Lager, Chaplain 01/02/2016 4:29 PM

## 2016-01-02 NOTE — Progress Notes (Signed)
[facility], [employer] HoldingsBecky Flowers. [facility].

## 2016-01-02 NOTE — Progress Notes (Signed)
EEG Completed; Results Pending  

## 2016-01-02 NOTE — Progress Notes (Signed)
Flu results negative- droplet precautions d/c'd; confirmed with Elink MD. Family at bedside aware of results.

## 2016-01-02 NOTE — Progress Notes (Signed)
Peripherally Inserted Central Catheter/Midline Placement  The IV Nurse has discussed with the patient and/or persons authorized to consent for the patient, the purpose of this procedure and the potential benefits and risks involved with this procedure.  The benefits include less needle sticks, lab draws from the catheter and patient may be discharged home with the catheter.  Risks include, but not limited to, infection, bleeding, blood clot (thrombus formation), and puncture of an artery; nerve damage and irregular heat beat.  Alternatives to this procedure were also discussed.  PICC/Midline Placement Documentation        Stacie Sanders, Stacie Sanders 01/02/2016, 1:30 PM Phone consent obtained from husband

## 2016-01-02 NOTE — Progress Notes (Signed)
Providing brief support with family in ICU.  Will [facility] for continued care on pt transfer.     Belva CromeStalnaker, Radek Carnero Wayne MDiv

## 2016-01-02 NOTE — Progress Notes (Signed)
PULMONARY / CRITICAL CARE MEDICINE   Name: Stacie Sanders MRN: [id number] DOB: 1971-03-21    ADMISSION DATE:  12/29/2015 CONSULTATION DATE:  12/29/15  REFERRING MD:  Nicanor AlconPalumbo  CHIEF COMPLAINT:  Drug overdose  STUDIES:  Port CXR 12/31:  Personally reviewed by me. Streaky opacity in right lower and left lower lung. ETT 3cm above carina.  MICROBIOLOGY: Tracheal Aspirate 12/31>> OPF Blood Cx x2 12/31>> Urine Cx 12/31>>  1/3 Resp culture >   1/4 Resp culture >  1/4 Blood Cx >  1/4 Resp Cx >  1/4 Urine Cx >  1/4 Influenza  ANTIBIOTICS: Vancomycin 12/31>> 1/2 Zosyn 12/31>> 1/2  Unasyn 1/2 >   SIGNIFICANT EVENTS: 12/31 - Admit & Intubated by EDP 1/3 extubated, re-intubated for stridor 1/4 Fever to 106.7, persistent agitation  LINES/TUBES: ETT 12/29/15 > 1/3 Foley 12/29/15 >  SUBJECTIVE:  reintubated yesterday Fever to 106.7 today Persistent agitation  VITAL SIGNS: BP 126/62 mmHg  Pulse 91  Temp(Src) 106.7 F (41.5 C) (Core (Comment))  Resp 21  Ht 5\' 8"  (1.727 m)  Wt 236 lb 1.8 oz (107.1 kg)  BMI 35.91 kg/m2  SpO2 95%  LMP   HEMODYNAMICS:    VENTILATOR SETTINGS: Vent Mode:  [-] PRVC FiO2 (%):  [40 %-100 %] 50 % Set Rate:  [14 bmp] 14 bmp Vt Set:  [510 mL] 510 mL PEEP:  [5 cmH20] 5 cmH20 Plateau Pressure:  [18 cmH20-23 cmH20] 20 cmH20  INTAKE / OUTPUT: I/O last 3 completed shifts: In: 3912.5 [I.V.:2730.4; NG/GT:482.2; IV Piggyback:700] Out: 3465 [Urine:2865; Emesis/NG output:600]  PHYSICAL EXAMINATION: General:  Heavily sedated on vent HENT: NCAT ETT in place, pupils pinpoint, reactive to light PULM: CTA B, vent supported breaths CV: RRR, no mgr GI: BS+, soft, nontender MSK: normal bulk and tone Derm: normal skin tone, dry, warm Neuro: Sedated on vent, withdraws to pain  LABS:  BMET  Recent Labs Lab 12/31/15 0331 01/01/16 0115 01/02/16 0400  NA 145 147* 149*  149*  K 3.3* 3.8 4.2  4.2  CL 110 115* 115*  116*  CO2 23 22 25  24    BUN 9 13 19  18   CREATININE 0.84 0.97 1.04*  1.05*  GLUCOSE 131* 150* 159*  157*    Electrolytes  Recent Labs Lab 12/30/15 0353 12/31/15 0331 01/01/16 0115 01/02/16 0400  CALCIUM 8.6* 9.0 8.6* 8.7*  8.7*  MG 1.7  --   --   --   PHOS 2.2* 2.5 3.0 1.6*    CBC  Recent Labs Lab 12/31/15 0331 01/01/16 0115 01/02/16 0400  WBC 16.6* 13.0* 13.3*  HGB 13.3 13.2 12.9  HCT 41.2 42.1 42.2  PLT 335 269 282    Coag's No results for input(s): APTT, INR in the last 168 hours.  Sepsis Markers  Recent Labs Lab 12/29/15 1201 12/29/15 1818 12/29/15 2327 12/30/15 0353 12/31/15 0331  LATICACIDVEN 2.8* 1.8 1.7  --   --   PROCALCITON 0.10  --   --  <0.10 <0.10    ABG  Recent Labs Lab 12/29/15 0203 12/29/15 0359 01/01/16 1245  PHART 7.279* 7.259* 7.372  PCO2ART 43.9 50.6* 39.1  PO2ART 93.3 182* 190*    Liver Enzymes  Recent Labs Lab 12/29/15 0028  12/31/15 0331 01/01/16 0115 01/02/16 0400  AST 41  --   --   --   --   ALT 52  --   --   --   --   ALKPHOS 122  --   --   --   --  BILITOT 0.5  --   --   --   --   ALBUMIN 4.1  < > 3.4* 3.3* 3.4*  < > = values in this interval not displayed.  Cardiac Enzymes No results for input(s): TROPONINI, PROBNP in the last 168 hours.  Glucose  Recent Labs Lab 01/01/16 1127 01/01/16 1522 01/01/16 1932 01/02/16 0054 01/02/16 0509 01/02/16 0732  GLUCAP 124* 165* 155* 143* 145* 147*    Imaging  1/3 CXR images personally reviewed> ETT in place, bibasilar atelectasis, no clear infiltrate, rotated film  ASSESSMENT / PLAN:  NEUROLOGIC A:   Persistent agitation, now with high fever: etiology uncertain: DT's? Benzo withdrawal? Serotonin, NMS, Malignant hyperthremia don't fit with labs, exam or medications Benzo overdose initially  Chronic benzodiazepine abuse Suicide Attempt  P:   RASS goal: -2 Neuro consult today due to ongoing agitation, encephalopathy with fever, unclear etiology Consider EEG Monitor  for seizure activity Repeat lithium level Repeat 12 lead for QTc (could this be due to extended release seroquel?) Continue clonazepam bid Prn versed Continue fentanyl gtt Check CK for rhabdo (can go along with drug related fever syndromes) Psychiatry consult once patient extubated & suicide precautions  PULMONARY A: Acute Hypoxic Respiratory Failure > resolved Aspiration Pneumonitis > better Post extubation stridor > emergent re-intubation P:   Decadron x6 total doses Continue full vent support VAP prevention Oral care protocol  CARDIOVASCULAR A:  Sinus tachycardia > resolved P:  Monitor patient on telemetry Vitals per unit protocol  RENAL A:   Hypokalemia > improved  P:   KCl again today > now Monitor BMET and UOP Replace electrolytes as needed  GASTROINTESTINAL A:   Nausea/vomiting P:   NPO Zantac bid zofran prn  HEMATOLOGIC A:   No acute issues  P:  Monitor for bleeding Lovenox [place] daily  SCDs  INFECTIOUS A: HCAP/Aspiration pneumonia > improving? UTI Fever> severely high, see discussion above, drug fever?  P:   Re culture today Have asked pharmacy to look for causes of drug fever Unasyn > plan 5 total days of antibiotics, maintain today, stop 1/4  ENDOCRINE A:   Hyperglycemia - No h/o DM    P:   Checking Hgb A1c Accu-Checks q4hr Low dose SSI per algorithm    PSYCHIATRIC A: Suicide Attempt with Overdose H/O Depression/Anxiety  P: Suicide precautions and psych consult once extubated   FAMILY  - Updates: Husband updated 1/4 AM by me  - Inter-disciplinary family meet or Palliative Care meeting due by:  01/04/16  My cc time 55 minutes  Heber [place], MD [place] PCCM Pager: [phone] Cell: [phone] After 3pm or if no response, call [phone]  01/02/2016 @ 9:03 AM

## 2016-01-02 NOTE — Consult Note (Addendum)
Consult Reason for Consult:altered mental status in setting of drug overdose Referring Physician: Dr Kendrick Fries CCM  CC: altered mental status  HPI: Stacie Sanders is an 45 y.o. female hx of depression and anxiety admitted 12/31 with drug overdose after taking 20 xanax, 12 klonopin, 6 shots and 8-9 beers. It is also unclear if she was taking seroquel and possibly benadryl or other anti-cholinergic substances. She was intubated in the ED. She was extubated but then subsequently re-intubated after developing stridor. Hospital course has been complicated by persistent agitation, now with fever (up to 106.7), leukocytosis (trending down)  Past Medical History  Diagnosis Date  . Depression   . Kidney stone     Past Surgical History  Procedure Laterality Date  . Tubal ligation    . Lithotripsy      History reviewed. No pertinent family history.  Social History:  reports that she has been smoking Cigarettes.  She does not have any smokeless tobacco history on file. She reports that she drinks alcohol. She reports that she does not use illicit drugs.  Allergies  Allergen Reactions  . Fentanyl Itching  . Percocet [Oxycodone-Acetaminophen] Hives and Itching    Medications: I have reviewed the patient's current medications.   ROS: Out of a complete 14 system review, the patient complains of only the following symptoms, and all other reviewed systems are negative. +agitation  Physical Examination: Filed Vitals:   01/02/16 0800 01/02/16 0900  BP: 126/62 128/62  Pulse: 91 87  Temp: 106.7 F (41.5 C) 106.5 F (41.4 C)  Resp: 21 16   Physical Exam  Constitutional: He appears well-developed and well-nourished.  Psych: Affect appropriate to situation Eyes: No scleral injection HENT: No OP obstrucion Head: Normocephalic.  Cardiovascular: Normal rate and regular rhythm.  Respiratory: Effort normal and breath sounds normal.  GI: Soft. Bowel sounds are normal. No distension. There is  no tenderness.  Skin: WDI  Neurologic Examination Mental Status: (on fentanyl and precedex) Eyes intermittently open, not making eye contact, non-verbal, not following commands. Agitated, restless appearing Cranial Nerves: II: unable to visualize optic discs, pupils equal, round, reactive to light III,IV, VI: ptosis not present, eyes midline, no gaze deviation V,VII: face symmetric,corneal reflex intact VIII: unable to test IX,X: gag reflex present XI: unable to test XII: unable to test Motor: Unable to formally test Moves all extremities symmetrically and against gravity Tone and bulk:normal tone throughout; no atrophy noted Sensory: weakly withdrawals in all 4 extremities Deep Tendon Reflexes: 2+ and symmetric throughout Plantars: Right: downgoing   Left: downgoing Cerebellar: Unable to test Gait: unable to test  Laboratory Studies:   Basic Metabolic Panel:  Recent Labs Lab 12/29/15 0038  12/30/15 0353 12/31/15 0331 01/01/16 0115 01/02/16 0400  NA 145  --  142 145 147* 149*  149*  K 3.8  --  3.4* 3.3* 3.8 4.2  4.2  CL 107  --  106 110 115* 115*  116*  CO2  --   --  24 23 22 25  24   GLUCOSE 114*  --  135* 131* 150* 159*  157*  BUN 4*  --  8 9 13 19  18   CREATININE 0.90  --  0.71 0.84 0.97 1.04*  1.05*  CALCIUM  --   < > 8.6* 9.0 8.6* 8.7*  8.7*  MG  --   --  1.7  --   --   --   PHOS  --   --  2.2* 2.5 3.0 1.6*  < > =  values in this interval not displayed.  Liver Function Tests:  Recent Labs Lab 12/29/15 0028 12/30/15 0353 12/31/15 0331 01/01/16 0115 01/02/16 0400  AST 41  --   --   --   --   ALT 52  --   --   --   --   ALKPHOS 122  --   --   --   --   BILITOT 0.5  --   --   --   --   PROT 7.4  --   --   --   --   ALBUMIN 4.1 3.5 3.4* 3.3* 3.4*   No results for input(s): LIPASE, AMYLASE in the last 168 hours. No results for input(s): AMMONIA in the last 168 hours.  CBC:  Recent Labs Lab 12/29/15 0028 12/29/15 0038 12/30/15 0353  12/31/15 0331 01/01/16 0115 01/02/16 0400  WBC 15.3*  --  19.2* 16.6* 13.0* 13.3*  NEUTROABS 8.7*  --  14.9* 12.7* 9.3* 11.0*  HGB 15.6* 17.7* 14.7 13.3 13.2 12.9  HCT 47.7* 52.0* 45.1 41.2 42.1 42.2  MCV 98.8  --  97.2 98.1 101.9* 102.9*  PLT 354  --  346 335 269 282    Cardiac Enzymes: No results for input(s): CKTOTAL, CKMB, CKMBINDEX, TROPONINI in the last 168 hours.  BNP: Invalid input(s): POCBNP  CBG:  Recent Labs Lab 01/01/16 1522 01/01/16 1932 01/02/16 0054 01/02/16 0509 01/02/16 0732  GLUCAP 165* 155* 143* 145* 147*    Microbiology: Results for orders placed or performed during the hospital encounter of 12/29/15  MRSA PCR Screening     Status: None   Collection Time: 12/29/15 10:23 AM  Result Value Ref Range Status   MRSA by PCR NEGATIVE NEGATIVE Final    Comment:        The GeneXpert MRSA Assay (FDA approved for NASAL specimens only), is one component of a comprehensive MRSA colonization surveillance program. It is not intended to diagnose MRSA infection nor to guide or monitor treatment for MRSA infections.   Culture, Urine     Status: None   Collection Time: 12/29/15 11:26 AM  Result Value Ref Range Status   Specimen Description URINE, CATHETERIZED  Final   Special Requests NONE  Final   Culture   Final    NO GROWTH 1 DAY Performed at [facility]    Report Status 12/30/2015 FINAL  Final  Culture, respiratory (NON-Expectorated)     Status: None   Collection Time: 12/29/15 11:27 AM  Result Value Ref Range Status   Specimen Description TRACHEAL ASPIRATE  Final   Special Requests NONE  Final   Gram Stain   Final    NO WBC SEEN NO SQUAMOUS EPITHELIAL CELLS SEEN MODERATE GRAM POSITIVE COCCI IN PAIRS Performed at [facility]    Culture   Final    NORMAL OROPHARYNGEAL FLORA Performed at [facility]    Report Status 01/01/2016 FINAL  Final  Culture, blood (routine x 2)     Status: None (Preliminary result)    Collection Time: 12/29/15 12:01 PM  Result Value Ref Range Status   Specimen Description BLOOD RIGHT HAND  Final   Special Requests BOTTLES DRAWN AEROBIC ONLY 5CC  Final   Culture   Final    NO GROWTH 3 DAYS Performed at [facility]    Report Status PENDING  Incomplete  Culture, blood (routine x 2)     Status: None (Preliminary result)   Collection Time: 12/29/15 12:13 PM  Result Value Ref Range Status   Specimen Description BLOOD RIGHT HAND  Final   Special Requests IN PEDIATRIC BOTTLE 1CC  Final   Culture   Final    NO GROWTH 3 DAYS [facility]    Report Status PENDING  Incomplete  Culture, respiratory (NON-Expectorated)     Status: None (Preliminary result)   Collection Time: 01/01/16 12:50 AM  Result Value Ref Range Status   Specimen Description TRACHEAL ASPIRATE  Final   Special Requests Normal  Final   Gram Stain PENDING  Incomplete   Culture   Final    Culture reincubated for better growth Performed at [facility]    Report Status PENDING  Incomplete    Coagulation Studies: No results for input(s): LABPROT, INR in the last 72 hours.  Urinalysis:  Recent Labs Lab 12/31/15 1217  COLORURINE RED*  LABSPEC 1.024  PHURINE 6.5  GLUCOSEU NEGATIVE  HGBUR LARGE*  BILIRUBINUR SMALL*  KETONESUR NEGATIVE  PROTEINUR 100*  NITRITE NEGATIVE  LEUKOCYTESUR SMALL*    Lipid Panel:     Component Value Date/Time   TRIG 409* 01/01/2016 0115    HgbA1C: No results found for: HGBA1C  Urine Drug Screen:     Component Value Date/Time   LABOPIA NONE DETECTED 12/29/2015 0027   COCAINSCRNUR NONE DETECTED 12/29/2015 0027   LABBENZ POSITIVE* 12/29/2015 0027   AMPHETMU NONE DETECTED 12/29/2015 0027   THCU NONE DETECTED 12/29/2015 0027   LABBARB NONE DETECTED 12/29/2015 0027    Alcohol Level:  Recent Labs Lab 12/29/15 0028  ETH 160*    Other results:  Imaging: Dg Abd 1 View  01/01/2016  CLINICAL DATA:  Nasogastric tube placement.   Initial encounter. EXAM: ABDOMEN - 1 VIEW COMPARISON:  Abdominal radiograph performed 12/29/2015 FINDINGS: The patient's enteric tube is noted ending overlying the body of the stomach. The stomach is largely filled with air. The visualized bowel gas pattern is grossly unremarkable. No free intra-abdominal air is identified, though evaluation for free air is limited on supine views. No acute osseous abnormalities are seen. IMPRESSION: Enteric tube noted ending overlying the body of the stomach. Electronically Signed   By: Roanna RaiderJeffery  Chang M.D.   On: 01/01/2016 01:53   Portable Chest Xray  01/01/2016  CLINICAL DATA:  Central line placement and gastric tube placement. Respiratory distress. EXAM: PORTABLE CHEST 1 VIEW COMPARISON:  01/01/2016 FINDINGS: Endotracheal tube remains in stable position. NG tube is in the stomach. No visible central line. No pneumothorax. Heart is borderline in size with vascular congestion. Left lower lobe atelectasis or infiltrate and possible small left effusion. IMPRESSION: Endotracheal tube and NG tube remain in place, unchanged. No visible central line. Vascular congestion. Left lower lobe atelectasis or infiltrate with possible small left effusion. Electronically Signed   By: Charlett NoseKevin  Dover M.D.   On: 01/01/2016 12:12   Dg Chest Port 1 View  01/01/2016  CLINICAL DATA:  Respiratory failure, intubated patient, drug overdose, current smoker. EXAM: PORTABLE CHEST 1 VIEW COMPARISON:  Portable chest x-ray of December 31, 2015 FINDINGS: The film is obtained in a reverse lordotic projection. The interstitial markings are more conspicuous bilaterally. The cardiac silhouette is mildly enlarged and the central pulmonary vascularity is engorged. The endotracheal tube tip lies 4 cm above the carina. The esophagogastric tube tip projects below the inferior margin of the image. IMPRESSION: Patient positioning today may be influencing the appearance of the pulmonary interstitium and cardiac silhouette.  The findings suggest mild CHF. Correlation with  the patient's clinical and laboratory values is needed. A repeat well-positioned portable chest x-ray may be useful. Electronically Signed   By: David  [place] M.D.   On: 01/01/2016 07:33   Dg Chest Port 1 View  12/31/2015  CLINICAL DATA:  Acute respiratory failure. EXAM: PORTABLE CHEST 1 VIEW COMPARISON:  12/29/2015 FINDINGS: 1023 hours. Endotracheal tube tip is 2.5 cm above the base of the carina. NG tube tip projects over the proximal stomach with proximal port of the tube at the level of the EG junction. There is some minimal basilar atelectasis. No focal airspace consolidation or overt airspace pulmonary edema. The cardiopericardial silhouette is within normal limits for size. The visualized bony structures of the thorax are intact. Telemetry leads overlie the chest. IMPRESSION: No substantial change in exam. Electronically Signed   By: Kennith Center M.D.   On: 12/31/2015 10:37   Dg Abd Portable 1v  01/01/2016  CLINICAL DATA:  Nasogastric tube placement EXAM: PORTABLE ABDOMEN - 1 VIEW COMPARISON:  Study obtained earlier in the day. FINDINGS: Nasogastric tube tip and side port in stomach. Stomach is no longer distended. Bowel gas pattern is unremarkable. No obstruction or free air. IMPRESSION: Nasogastric tube tip and side port in stomach. Bowel gas pattern unremarkable. Electronically Signed   By: Bretta Bang III M.D.   On: 01/01/2016 12:12     Assessment/Plan:  44y/o woman with hx of depression, anxiety admitted 12//31 with drug overdose after taking at least 20 xanax, 12 klonopin, 6 shots and 8-9 beers. Unclear if she ingested any seroquel and/or benadryl. Hospital course complicated by respiratory failure, persistent encephalopathy, leukocytosis and now with fever up to 106.7. Broad differential of her presentation. Unclear if she ingested unknown substances, question possible NMS type picture related to seroquel ingestion. EtOH withdrawal could  mimic this presentation. Less likely based on history but would also need to rule out CNS infection vs seizure/post-ictal state.  -EEG -LP (discussed with CCM, reports they will plan for this afternoon), check for cell count, Gram stain and culture, protein, glucose, HSV -check CK, TSH, ammonia -CT head -will continue to follow   Addendum: Reviewed EEG, shows generalized sharp and slow wave activity. The frontal activity appears to have some triphasic morphology but based on generalized nature and overall clinical picture have concern for possible NCSE. Loaded with keppra. Discussed with CCM, Dr Kendrick Fries. They will arrange transfer to [facility] for LTM EEG monitoring. Notified neurology team at [facility] of pending transfer.    This patient is critically ill and at significant risk of neurological worsening, death and care requires constant monitoring of vital signs, hemodynamics,respiratory and cardiac monitoring,review of multiple databases, neurological assessment, discussion with family, other specialists and medical decision making of high complexity. I spent 35 inutes of neurocritical care time in the care of this patient.    Elspeth Cho, DO [facility] [phone]  If 7pm- 7am, please page neurology on call as listed in AMION. 01/02/2016, 9:29 AM

## 2016-01-03 ENCOUNTER — Inpatient Hospital Stay (HOSPITAL_COMMUNITY): Payer: Medicaid Other

## 2016-01-03 DIAGNOSIS — J9600 Acute respiratory failure, unspecified whether with hypoxia or hypercapnia: Secondary | ICD-10-CM

## 2016-01-03 DIAGNOSIS — G9340 Encephalopathy, unspecified: Secondary | ICD-10-CM

## 2016-01-03 DIAGNOSIS — J9601 Acute respiratory failure with hypoxia: Secondary | ICD-10-CM | POA: Insufficient documentation

## 2016-01-03 LAB — CULTURE, RESPIRATORY: SPECIAL REQUESTS: NORMAL

## 2016-01-03 LAB — CBC WITH DIFFERENTIAL/PLATELET
BASOS PCT: 0 %
Basophils Absolute: 0 10*3/uL (ref 0.0–0.1)
Eosinophils Absolute: 0 10*3/uL (ref 0.0–0.7)
Eosinophils Relative: 0 %
HEMATOCRIT: 37.1 % (ref 36.0–46.0)
HEMOGLOBIN: 11.7 g/dL — AB (ref 12.0–15.0)
LYMPHS ABS: 2.5 10*3/uL (ref 0.7–4.0)
Lymphocytes Relative: 16 %
MCH: 32.2 pg (ref 26.0–34.0)
MCHC: 31.5 g/dL (ref 30.0–36.0)
MCV: 102.2 fL — ABNORMAL HIGH (ref 78.0–100.0)
MONO ABS: 1 10*3/uL (ref 0.1–1.0)
MONOS PCT: 6 %
NEUTROS ABS: 11.9 10*3/uL — AB (ref 1.7–7.7)
NEUTROS PCT: 78 %
Platelets: 195 10*3/uL (ref 150–400)
RBC: 3.63 MIL/uL — ABNORMAL LOW (ref 3.87–5.11)
RDW: 13.2 % (ref 11.5–15.5)
WBC: 15.3 10*3/uL — ABNORMAL HIGH (ref 4.0–10.5)

## 2016-01-03 LAB — BASIC METABOLIC PANEL
Anion gap: 9 (ref 5–15)
BUN: 19 mg/dL (ref 6–20)
CALCIUM: 8 mg/dL — AB (ref 8.9–10.3)
CHLORIDE: 120 mmol/L — AB (ref 101–111)
CO2: 22 mmol/L (ref 22–32)
CREATININE: 1.14 mg/dL — AB (ref 0.44–1.00)
GFR calc non Af Amer: 58 mL/min — ABNORMAL LOW (ref >60)
GLUCOSE: 152 mg/dL — AB (ref 65–99)
Potassium: 3 mmol/L — ABNORMAL LOW (ref 3.5–5.1)
Sodium: 151 mmol/L — ABNORMAL HIGH (ref 135–145)

## 2016-01-03 LAB — GLUCOSE, CAPILLARY
GLUCOSE-CAPILLARY: 110 mg/dL — AB (ref 65–99)
GLUCOSE-CAPILLARY: 139 mg/dL — AB (ref 65–99)
Glucose-Capillary: 100 mg/dL — ABNORMAL HIGH (ref 65–99)
Glucose-Capillary: 139 mg/dL — ABNORMAL HIGH (ref 65–99)
Glucose-Capillary: 142 mg/dL — ABNORMAL HIGH (ref 65–99)
Glucose-Capillary: 168 mg/dL — ABNORMAL HIGH (ref 65–99)

## 2016-01-03 LAB — RENAL FUNCTION PANEL
Albumin: 2.6 g/dL — ABNORMAL LOW (ref 3.5–5.0)
Anion gap: 10 (ref 5–15)
BUN: 19 mg/dL (ref 6–20)
CALCIUM: 8 mg/dL — AB (ref 8.9–10.3)
CHLORIDE: 118 mmol/L — AB (ref 101–111)
CO2: 22 mmol/L (ref 22–32)
CREATININE: 1.15 mg/dL — AB (ref 0.44–1.00)
GFR calc Af Amer: >60 mL/min (ref >60)
GFR calc non Af Amer: 58 mL/min — ABNORMAL LOW (ref >60)
GLUCOSE: 151 mg/dL — AB (ref 65–99)
Phosphorus: 3 mg/dL (ref 2.5–4.6)
Potassium: 3 mmol/L — ABNORMAL LOW (ref 3.5–5.1)
SODIUM: 150 mmol/L — AB (ref 135–145)

## 2016-01-03 LAB — CK TOTAL AND CKMB (NOT AT ARMC)
CK, MB: 31.4 ng/mL — AB (ref 0.5–5.0)
RELATIVE INDEX: 0.6 (ref 0.0–2.5)
Total CK: 5168 U/L — ABNORMAL HIGH (ref 38–234)

## 2016-01-03 LAB — CULTURE, RESPIRATORY W GRAM STAIN: Culture: NORMAL

## 2016-01-03 LAB — CK: Total CK: 6624 U/L — ABNORMAL HIGH (ref 38–234)

## 2016-01-03 LAB — CULTURE, BLOOD (ROUTINE X 2)
CULTURE: NO GROWTH
CULTURE: NO GROWTH

## 2016-01-03 LAB — LACTIC ACID, PLASMA
LACTIC ACID, VENOUS: 1.1 mmol/L (ref 0.5–2.0)
Lactic Acid, Venous: 0.9 mmol/L (ref 0.5–2.0)

## 2016-01-03 LAB — PROCALCITONIN: PROCALCITONIN: 0.98 ng/mL

## 2016-01-03 MED ORDER — VITAL HIGH PROTEIN PO LIQD
1000.0000 mL | ORAL | Status: DC
  Administered 2016-01-03: 1000 mL
  Filled 2016-01-03 (×2): qty 1000

## 2016-01-03 MED ORDER — POTASSIUM CHLORIDE 20 MEQ/15ML (10%) PO SOLN
30.0000 meq | ORAL | Status: AC
Start: 2016-01-03 — End: 2016-01-03
  Administered 2016-01-03: 30 meq
  Filled 2016-01-03: qty 30

## 2016-01-03 MED ORDER — FREE WATER
200.0000 mL | Freq: Four times a day (QID) | Status: DC
  Administered 2016-01-03 – 2016-01-04 (×3): 200 mL

## 2016-01-03 MED ORDER — LACTATED RINGERS IV SOLN
INTRAVENOUS | Status: DC
  Administered 2016-01-03 – 2016-01-04 (×2): via INTRAVENOUS

## 2016-01-03 MED ORDER — LEVOFLOXACIN IN D5W 500 MG/100ML IV SOLN
500.0000 mg | INTRAVENOUS | Status: DC
  Administered 2016-01-03 – 2016-01-07 (×5): 500 mg via INTRAVENOUS
  Filled 2016-01-03 (×5): qty 100

## 2016-01-03 NOTE — Progress Notes (Signed)
Update 01/03/2016 - no cuff leak per RT and per RN gets very agitated off fent gtt despite precdedex gtt.  Plan awiat pCT, lactate, Ck No exutbation 01/03/2016 esp given stridor 2d ago  Dr. Kalman ShanMurali Kasra Melvin, M.D., [facility]P Pulmonary and Critical Care Medicine Staff Physician [facility] [place] [facility] Pager: [phone], If no answer or between  15:00h - 7:00h: call [phone]  01/03/2016 12:32 PM

## 2016-01-03 NOTE — Progress Notes (Signed)
eLink Physician-Brief Progress Note Patient Name: Andre Lefortracy L Ibsen DOB: [birth year] MRN: [id number]   Date of Service  01/03/2016  HPI/Events of Note  CK continuing to rise.   eICU Interventions  Starting LR at 150cc/hr & Free water via tube 200cc q6hr.     Intervention Category Intermediate Interventions: Other:  Lawanda CousinsJennings Cady Hafen 01/03/2016, 7:38 PM

## 2016-01-03 NOTE — Progress Notes (Addendum)
PULMONARY / CRITICAL CARE MEDICINE   Name: Stacie Sanders MRN: [id number] DOB: [birth year]    ADMISSION DATE:  12/29/2015 CONSULTATION DATE:  12/29/15  REFERRING MD:  Nicanor Alcon  CHIEF COMPLAINT:  Drug overdose  MICROBIOLOGY: Tracheal Aspirate 12/31>> OPF Blood Cx x2 12/31>> Urine Cx 12/31>>  1/3 Resp culture >   1/4 Resp culture >  1/4 Blood Cx >  1/4 Resp Cx >  1/4 Urine Cx >  1/4 Influenza - negative      ANTIBIOTICS: Vancomycin 12/31>> 1/2 Zosyn 12/31>> 1/2 , Unasyn 1/2 >1/5 (? Drug fever)   LINES/TUBES: ETT 12/29/15 > 1/3 Foley 12/29/15 >   SIGNIFICANT EVENTS: 12/31 - Admit & Intubated by EDP 1/3 extubated, re-intubated for stridor 1/4 Fever to 106.7, persistent agitation   SUBJECTIVE:  01/03/16:Failed SBT earlier - was on high dose range of precedex . SEt off apnea alarms . Gets very agitated when not on sedation. Fever rose to 107F  dramatically improved - after Rx wuth  arctic sun pads 37.2. Marland Kitchen WBC improved. Seizure free on EEG per neuro  VITAL SIGNS: BP 105/63 mmHg  Pulse 63  Temp(Src) 99.1 F (37.3 C) (Core (Comment))  Resp 16  Ht 5\' 8"  (1.727 m)  Wt 106.4 kg (234 lb 9.1 oz)  BMI 35.67 kg/m2  SpO2 100%  LMP   HEMODYNAMICS:    VENTILATOR SETTINGS: Vent Mode:  [-] PRVC FiO2 (%):  [40 %-50 %] 40 % Set Rate:  [14 bmp] 14 bmp Vt Set:  [510 mL] 510 mL PEEP:  [5 cmH20] 5 cmH20 Pressure Support:  [5 cmH20] 5 cmH20 Plateau Pressure:  [13 cmH20-20 cmH20] 20 cmH20  INTAKE / OUTPUT: I/O last 3 completed shifts: In: 4267.7 [I.V.:3347.7; IV Piggyback:920] Out: 4660 [Urine:3790; Emesis/NG output:870]  PHYSICAL EXAMINATION: General: HENT: NCAT ETT in place, pupils pinpoint, reactive to light PULM: CTA B, vent supported breaths CV: RRR, no mgr GI: BS+, soft, nontender MSK: normal bulk and tone Derm: normal skin tone, dry, warm Neuro: RASS -2/-3 on precedex gtt. Follows commands biefly LABS: PULMONARY  Recent Labs Lab 12/29/15 0038  12/29/15 0203 12/29/15 0359 01/01/16 1245 01/02/16 0932  PHART  --  7.279* 7.259* 7.372 7.333*  PCO2ART  --  43.9 50.6* 39.1 45.4*  PO2ART  --  93.3 182* 190* 82.0  HCO3  --  19.9* 21.9 22.2 22.1  TCO2 25 17.9 19.5 20.0 20.0  O2SAT  --  96.1 98.7 99.1 91.2    CBC  Recent Labs Lab 01/01/16 0115 01/02/16 0400 01/03/16 0540  HGB 13.2 12.9 11.7*  HCT 42.1 42.2 37.1  WBC 13.0* 13.3* 15.3*  PLT 269 282 195    COAGULATION No results for input(s): INR in the last 168 hours.  CARDIAC  No results for input(s): TROPONINI in the last 168 hours. No results for input(s): PROBNP in the last 168 hours.   CHEMISTRY  Recent Labs Lab 12/30/15 0353 12/31/15 0331 01/01/16 0115 01/02/16 0400 01/03/16 0540  NA 142 145 147* 149*  149* 151*  150*  K 3.4* 3.3* 3.8 4.2  4.2 3.0*  3.0*  CL 106 110 115* 115*  116* 120*  118*  CO2 24 23 22 25  24 22  22   GLUCOSE 135* 131* 150* 159*  157* 152*  151*  BUN 8 9 13 19  18 19  19   CREATININE 0.71 0.84 0.97 1.04*  1.05* 1.14*  1.15*  CALCIUM 8.6* 9.0 8.6* 8.7*  8.7* 8.0*  8.0*  MG 1.7  --   --   --   --  PHOS 2.2* 2.5 3.0 1.6* 3.0   Estimated Creatinine Clearance: 81.4 mL/min (by C-G formula based on Cr of 1.15).   LIVER  Recent Labs Lab 12/29/15 0028 12/30/15 0353 12/31/15 0331 01/01/16 0115 01/02/16 0400 01/03/16 0540  AST 41  --   --   --   --   --   ALT 52  --   --   --   --   --   ALKPHOS 122  --   --   --   --   --   BILITOT 0.5  --   --   --   --   --   PROT 7.4  --   --   --   --   --   ALBUMIN 4.1 3.5 3.4* 3.3* 3.4* 2.6*     INFECTIOUS  Recent Labs Lab 12/29/15 1201 12/29/15 1818 12/29/15 2327 12/30/15 0353 12/31/15 0331 01/02/16 0932  LATICACIDVEN 2.8* 1.8 1.7  --   --  0.9  PROCALCITON 0.10  --   --  <0.10 <0.10  --      ENDOCRINE CBG (last 3)   Recent Labs  01/03/16 0009 01/03/16 0409 01/03/16 0823  GLUCAP 168* 142* 100*         IMAGING x48h  - image(s) personally  visualized  -   highlighted in bold Ct Head Wo Contrast  01/02/2016  CLINICAL DATA:  Status epilepticus. Drug overdose. Fever. Leukocytosis. EXAM: CT HEAD WITHOUT CONTRAST TECHNIQUE: Contiguous axial images were obtained from the base of the skull through the vertex without intravenous contrast. COMPARISON:  None. FINDINGS: No mass lesion. No midline shift. No acute hemorrhage or hematoma. No extra-axial fluid collections. No evidence of acute infarction. Brain parenchyma is normal. There is partial opacification of the paranasal sinuses. This is most likely chronic. Osseous structures are otherwise normal. IMPRESSION: No acute intracranial abnormality.  Chronic sinus disease. Electronically Signed   By: Francene Boyers M.D.   On: 01/02/2016 12:59   Dg Chest Port 1 View  01/03/2016  CLINICAL DATA:  Acute respiratory failure, drug overdose, acute encephalopathy. EXAM: PORTABLE CHEST 1 VIEW COMPARISON:  Portable chest x-ray of January 02, 2016 FINDINGS: The lungs are adequately inflated. The interstitial markings remain increased. Areas of confluent alveolar opacity persist but are slightly less conspicuous. The cardiac silhouette remains enlarged. The central pulmonary vascularity remains engorged. The endotracheal tube tip lies 3 cm above the carina. The esophagogastric tube tip projects below the inferior margin of the image. A left-sided PICC line has been placed since yesterday's study with the tip at or just above the cavoatrial junction. IMPRESSION: Slight interval improvement in the appearance of the pulmonary interstitium but considerable interstitial edema or pneumonia process. Mild cardiomegaly with mild central pulmonary vascular congestion. The support tubes are in reasonable position though the PICC line tip does lie at or just above the cavoatrial junction. Electronically Signed   By: David  [place] M.D.   On: 01/03/2016 07:32   Dg Chest Port 1 View  01/02/2016  CLINICAL DATA:  Acute respiratory  failure, hypoxemia EXAM: PORTABLE CHEST 1 VIEW COMPARISON:  01/01/2016 and 12/31/2015 FINDINGS: Cardiomediastinal silhouette is stable. Endotracheal tube in place with tip 4 cm above the carina. Stable NG tube position. There is mild interstitial prominence bilaterally with subtle patchy airspace opacities. Findings suspicious for mild interstitial edema. Superimposed patchy alveolar edema or infiltrates cannot be excluded. Clinical correlation is necessary. IMPRESSION: There is mild interstitial prominence bilaterally with subtle patchy airspace opacities.  Findings suspicious for mild interstitial edema. Superimposed patchy alveolar edema or infiltrates cannot be excluded. Clinical correlation is necessary. Electronically Signed   By: Natasha Mead M.D.   On: 01/02/2016 10:48   Portable Chest Xray  01/01/2016  CLINICAL DATA:  Central line placement and gastric tube placement. Respiratory distress. EXAM: PORTABLE CHEST 1 VIEW COMPARISON:  01/01/2016 FINDINGS: Endotracheal tube remains in stable position. NG tube is in the stomach. No visible central line. No pneumothorax. Heart is borderline in size with vascular congestion. Left lower lobe atelectasis or infiltrate and possible small left effusion. IMPRESSION: Endotracheal tube and NG tube remain in place, unchanged. No visible central line. Vascular congestion. Left lower lobe atelectasis or infiltrate with possible small left effusion. Electronically Signed   By: Charlett Nose M.D.   On: 01/01/2016 12:12   Dg Abd Portable 1v  01/02/2016  CLINICAL DATA:  Ileus, distended abdomen EXAM: PORTABLE ABDOMEN - 1 VIEW COMPARISON:  01/01/2016 FINDINGS: There is normal small bowel gas pattern. NG tube in place with tip in distal stomach. Moderate gaseous distension of the right colon and transverse colon. IMPRESSION: Normal small bowel gas pattern. Moderate gaseous distension of the right and transverse colon. NG tube in place. Electronically Signed   By: Natasha Mead M.D.    On: 01/02/2016 10:49   Dg Abd Portable 1v  01/01/2016  CLINICAL DATA:  Nasogastric tube placement EXAM: PORTABLE ABDOMEN - 1 VIEW COMPARISON:  Study obtained earlier in the day. FINDINGS: Nasogastric tube tip and side port in stomach. Stomach is no longer distended. Bowel gas pattern is unremarkable. No obstruction or free air. IMPRESSION: Nasogastric tube tip and side port in stomach. Bowel gas pattern unremarkable. Electronically Signed   By: Bretta Bang III M.D.   On: 01/01/2016 12:12        ASSESSMENT / PLAN:  NEUROLOGIC A:   Persistent agitation, now with high fever: etiology uncertain: DT's? Benzo withdrawal? Serotonin, NMS, Malignant hyperthremia don't fit with labs, exam or medications Benzo overdose initially  Chronic benzodiazepine abuse Suicide Attempt  - 01/03/2016 ->  Reports of agitation when percedex weaned. Fever improved but on arctic sun 37.52F. Neuro repiorts no seizuer on EEG. LP ? indicated  P:   Precedex gtt - cut down dose to improve sedation score for RASS goal 0 Neuro to opine on LP Continue clonazepam bid Prn versed Contn fent gtt - turn off for better [facility] Psychiatry consult once patient extubated & suicide precautions  PULMONARY A: Acute Hypoxic Respiratory Failure > resolved Aspiration Pneumonitis > better Post extubation stridor > emergent re-intubation    - needs to come off  fent gtt + decrease percedex for better WUA (set off apnea alarms on SBT) and SBT  P:   Decadron x6 total doses startmiog 01/01/16 Rechallenge with SBT 01/03/16 Continue full vent support VAP prevention Oral care protocol  CARDIOVASCULAR A:  Nil acute P:  Monitor patient on telemetry Vitals per unit protocol  RENAL A:   Hypokalemia  Mild rhabdo CK 1100s on 11/01/16 - goes along with drug syndromes  P:   KCl repletion per elink Monitor BMET and UOP Replace electrolytes as needed  GASTROINTESTINAL A:   Nausea/vomiting - none on 01/03/16 P:    NPO Zantac bid zofran prn TF consult   HEMATOLOGIC A:   No acute issues  P:  Monitor for bleeding Lovenox Onset daily  SCDs  INFECTIOUS A: HCAP/Aspiration pneumonia  At admit 12/29/2015 High grade fever 107F - on 01/02/16   -  escalation after start unasyn 12/31/15  - normal PCT and lactate and negative cultures so far as of 01/03/16  - CK elevated  - Ddx is sepsis v drug fever v drug syndromes v some combo  01/03/16 - fever controlled but on arctic sun pads @ 37.27F   P:  \monitor fever off arctic sun pads aWait Re culture 01/02/16 DC unasyn - possible drug fever Start levaquin (check QTc on 12 lead) - and start if Qtc < 500msec (it is 0.35 on monitor) Check PCT and lactate and CK 01/03/16   ENDOCRINE A:   Hyperglycemia - No h/o DM    P:   Checking Hgb A1c Accu-Checks q4hr Low dose SSI per algorithm    PSYCHIATRIC A: Suicide Attempt with Overdose H/O Depression/Anxiety  P: Suicide precautions and psych consult once extubated   FAMILY  - Updates: Husband updated 1/4 AM by PCCM MD. No family at bedside but a friend RN was there who observed my interactions with bedside RN  - Inter-disciplinary family meet or Palliative Care meeting due by:  01/04/16       The patient is critically ill with multiple organ systems failure and requires high complexity decision making for assessment and support, frequent evaluation and titration of therapies, application of advanced monitoring technologies and extensive interpretation of multiple databases.   Critical Care Time devoted to patient care services described in this note is  40  Minutes. This time reflects time of care of this signee Dr Kalman ShanMurali Kashis Penley. This critical care time does not reflect procedure time, or teaching time or supervisory time of PA/NP/Med student/Med Resident etc but could involve care discussion time    Dr. Kalman ShanMurali Cher Egnor, M.D., [facility].C.C.P Pulmonary and Critical Care Medicine Staff Physician [place]  [facility] [place] Pulmonary and Critical Care Pager: [phone], If no answer or between  15:00h - 7:00h: call [phone]  01/03/2016 9:41 AM         Dr. Kalman ShanMurali Cavin Longman, M.D., [facility].C.C.P Pulmonary and Critical Care Medicine Staff Physician [place] [facility] [place] Pulmonary and Critical Care Pager: [phone], If no answer or between  15:00h - 7:00h: call [phone]  01/03/2016 9:38 AM

## 2016-01-03 NOTE — Progress Notes (Signed)
Nutrition Follow-up  DOCUMENTATION CODES:   Obesity unspecified  INTERVENTION:   Vital High Protein @ 40 ml/hr 30 ml Prostat TID Provides: 1260 kcal, 129 grams protein, and 803 ml H2O.   NUTRITION DIAGNOSIS:   Inadequate oral intake related to inability to eat as evidenced by NPO status. Ongoing.   GOAL:   Provide needs based on ASPEN/SCCM guidelines Not met.   MONITOR:   Vent status, Labs, Weight trends, TF tolerance, Skin, I & O's  REASON FOR ASSESSMENT:   Consult Enteral/tube feeding initiation and management  ASSESSMENT:   72F with history of depression and anxiety with outpatient prescriptions for Klonopin and Lithium who presented to the [facility] ED on 12/30 after an intentional overdose of benzodiazepines (klonopin, xanax) and alcohol. Reportedly she consumed 20 xanax, 12 Klonopin, 6 shots, 8-9 beers and then reported to the ED. She reportedly has been non-compliant with her lithium. It is unclear if she was taking seroquel. There was also some concern for benadryl or other anti-cholinergic substance. After arrival in the ED she became progressively more obtunded and required intubation  Patient is currently intubated on ventilator support MV: 10.9 L/min Temp (24hrs), Avg:101.9 F (38.8 C), Min:97.9 F (36.6 C), Max:106.7 F (41.5 C)   Medications reviewed and include: Miralax Labs reviewed: sodium elevated 151, potassium low 3.0 CBG's: 100-142 Pt discussed during ICU rounds and with RN.  No extubation, no cuff leak.  1/3 extubated with re-intubation.  1/4 fever to 106.7, normotherapy started, transferred to [facility] for EEG, Neuro following.   Diet Order:  Diet NPO time specified  Skin:  Reviewed, no issues  Last BM:  PTA  Height:   Ht Readings from Last 1 Encounters:  01/01/16 _0  (1.727 m)   Weight:   Wt Readings from Last 1 Encounters:  01/02/16 234 lb 9.1 oz (106.4 kg)   Ideal Body Weight:  63.6 kg  BMI:  Body mass index is 35.67  kg/(m^2).  Estimated Nutritional Needs:   Kcal:  3546-5681  Protein:  120-130g  Fluid:  2L/day  EDUCATION NEEDS:   No education needs identified at this time  [place], [place], [place] Pager [phone] After Hours Pager

## 2016-01-03 NOTE — Progress Notes (Signed)
Pharmacy Antibiotic Follow-up Note  Stacie Sanders is a 45 y.o. year-old female admitted on 12/29/2015.  The patient is currently on day 6 of abx for aspiration PNA. Tmax/24h 106.7, WBC up to 15.3, LA trend down to 0.9, PCT undetectable. LP but likely something neuro  - 12/31 CXR: atelectasis vs. pneumonia  Plan: - Unasyn 3g q6h  - Monitor clinical progress, renal function, abx plan/LOT  - F/u repeat cx, LP  Temp (24hrs), Avg:103.5 F (39.7 C), Min:97.9 F (36.6 C), Max:106.7 F (41.5 C)   Recent Labs Lab 12/30/15 0353 12/31/15 0331 01/01/16 0115 01/02/16 0400 01/03/16 0540  WBC 19.2* 16.6* 13.0* 13.3* 15.3*    Recent Labs Lab 12/30/15 0353 12/31/15 0331 01/01/16 0115 01/02/16 0400 01/03/16 0540  CREATININE 0.71 0.84 0.97 1.04*  1.05* 1.14*  1.15*   Estimated Creatinine Clearance: 81.4 mL/min (by C-G formula based on Cr of 1.15).    Allergies  Allergen Reactions  . Fentanyl Itching  . Percocet [Oxycodone-Acetaminophen] Hives and Itching    Antimicrobials this admission: 12/31 vanc>> 1/2  12/31 zosyn>> 1/2  1/2 unasyn >>   Levels/dose changes this admission: n/a  Microbiology results: 12/31 BCx: ngtd  12/31 UCx: NGF  12/31 Sputum: normal flora (F)  12/31 MRSA PCR: negative  1/3 urine: ngf  1/2 blood: ngtd  1/3 trach asp:  1/4 blood:  1/4 trach asp:  1/4: flu panel: neg  Babs BertinHaley Nevyn Bossman, PharmD, BCPS Clinical Pharmacist Pager [phone] 01/03/2016 8:19 AM

## 2016-01-03 NOTE — Progress Notes (Signed)
[facility] FOR AM LAB REPLACEMENT ONLY  The patient does apply for the [facility] Electrolyte [facility] Protocol based on the criteria listed below:   1. Is GFR >/= 40 ml/min? Yes.    Patient's GFR today is >60 2. Is urine output >/= 0.5 ml/kg/hr for the last 6 hours? Yes.   Patient's UOP is 0.55 ml/kg/hr 3. Is BUN < 60 mg/dL? Yes.    Patient's BUN today is 19 4. Abnormal electrolyte K 3 5. Ordered repletion with: per protocol 6. If a panic level lab has been reported, has the CCM MD in charge been notified? Yes.  .   Physician:  Dr Brent Generalamachandran  Stacie Sanders 01/03/2016 6:24 AM

## 2016-01-03 NOTE — Progress Notes (Signed)
RT note- SBT attempted, apnea, will try again later this morning.

## 2016-01-03 NOTE — Procedures (Signed)
Continuous 15 hour  Long-Term Video EEG Monitoring  Requesting Physician : Dr. Elspeth ChoPeter Sumner  Reading Physician: Dr. Sabra Heckhristopher Casia Corti  History: This is a 45 year old white female who was  admitted with a drug overdose and now presents with a persistent encephalopathy.  Medications: Keppra and clonazepam.  Technique: This was a continuous 15 hour 17 channel video EEG monitoring with 16 channels of EEG as well as one channel of EKG which was  performed during a state of stupor. Neither photic stimulation nor hyperventilation were performed as activating procedures.  Description: As the tracing opens, the background consists of some generalized 5-6 Hz delta theta activity with a voltage range of 7-35 V.  Superimposed upon this background were some periodic triphasic ways.  There was 1 pushbutton event which occurred at 7:48:15 when  the patient was lying on her back moving her arms.  This was not associated with any electrographic correlate.  As the  recording continued at 11:40:40 5 PM the recording abruptly stopped and was not restarted until 2:25 AM.  Once it was restarted there was some generalized T6 electrode artifact which did not significantly degrade the recording.  There were no further pushbutton events noted.  There were no asymmetries nor epileptiform discharges noted.  Impression: This was an abnormal continuous 15 hour video EEG monitoring as demonstrated by the presence of some moderate diffuse slowing with some superimposed triphasic waves.  There was 1 pushbutton event recorded during the monitoring.  Clinical Correlation: The above findings are consistent with a moderate diffuse or multifocal encephalopathy as can be seen secondary to medication effect, infection or other  diffuse metabolic abnormalities.  There was  1 pushbutton event recorded during the monitoring which was  felt to be nonepileptic in nature.  There were no electrographic seizures noted during the  monitoring.    Lilli Fewhristopher Scott Faviola Klare, M.D.

## 2016-01-03 NOTE — Progress Notes (Signed)
Subjective: Intubated but alert and able to follow commands. Agitated and wants the ET out. No further seizures. LTM still running.   Objective: Current vital signs: BP 105/63 mmHg  Pulse 63  Temp(Src) 99.1 F (37.3 C) (Core (Comment))  Resp 16  Ht 5\' 8"  (1.727 m)  Wt 106.4 kg (234 lb 9.1 oz)  BMI 35.67 kg/m2  SpO2 100%  LMP  Vital signs in last 24 hours: Temp:  [97.9 F (36.6 C)-106.7 F (41.5 C)] 99.1 F (37.3 C) (01/05 0800) Pulse Rate:  [55-115] 63 (01/05 0800) Resp:  [10-27] 16 (01/05 0800) BP: (89-142)/(55-80) 105/63 mmHg (01/05 0800) SpO2:  [94 %-100 %] 100 % (01/05 0800) FiO2 (%):  [40 %-50 %] 40 % (01/05 0809) Weight:  [106.4 kg (234 lb 9.1 oz)] 106.4 kg (234 lb 9.1 oz) (01/04 1700)  Intake/Output from previous day: 01/04 0701 - 01/05 0700 In: 2871 [I.V.:2201; IV Piggyback:670] Out: 3065 [Urine:2565; Emesis/NG output:500] Intake/Output this shift: Total I/O In: 279.8 [I.V.:279.8] Out: 100 [Urine:100] Nutritional status: Diet NPO time specified  Neurologic Exam: General: NAD Mental Status: Alert, intubated Able to follow 3 step commands without difficulty. Cranial Nerves: II:  Visual fields grossly normal, pupils equal, round, reactive to light and accommodation III,IV, VI: ptosis not present, extra-ocular motions intact bilaterally V,VII: smile symmetric, facial light touch sensation normal bilaterally VIII: hearing normal bilaterally IX,X: uvula rises symmetrically XI: bilateral shoulder shrug XII: midline tongue extension without atrophy or fasciculations  Motor: Right : Upper extremity   5/5    Left:     Upper extremity   5/5  Lower extremity   5/5     Lower extremity   5/5 Tone and bulk:normal tone throughout; no atrophy noted Sensory: Pinprick and light touch intact throughout, bilaterally Deep Tendon Reflexes:  Right: Upper Extremity   Left: Upper extremity   biceps (C-5 to C-6) 2/4   biceps (C-5 to C-6) 2/4 tricep (C7) 2/4    triceps (C7)  2/4 Brachioradialis (C6) 2/4  Brachioradialis (C6) 2/4  Lower Extremity Lower Extremity  quadriceps (L-2 to L-4) 2/4   quadriceps (L-2 to L-4) 2/4 Achilles (S1) 2/4   Achilles (S1) 2/4  Plantars: Right: downgoing   Left: downgoing Cerebellar: normal finger-to-nose,  normal heel-to-shin test    Lab Results: Basic Metabolic Panel:  Recent Labs Lab 12/30/15 0353 12/31/15 0331 01/01/16 0115 01/02/16 0400 01/03/16 0540  NA 142 145 147* 149*  149* 151*  150*  K 3.4* 3.3* 3.8 4.2  4.2 3.0*  3.0*  CL 106 110 115* 115*  116* 120*  118*  CO2 24 23 22 25  24 22  22   GLUCOSE 135* 131* 150* 159*  157* 152*  151*  BUN 8 9 13 19  18 19  19   CREATININE 0.71 0.84 0.97 1.04*  1.05* 1.14*  1.15*  CALCIUM 8.6* 9.0 8.6* 8.7*  8.7* 8.0*  8.0*  MG 1.7  --   --   --   --   PHOS 2.2* 2.5 3.0 1.6* 3.0    Liver Function Tests:  Recent Labs Lab 12/29/15 0028 12/30/15 0353 12/31/15 0331 01/01/16 0115 01/02/16 0400 01/03/16 0540  AST 41  --   --   --   --   --   ALT 52  --   --   --   --   --   ALKPHOS 122  --   --   --   --   --   BILITOT 0.5  --   --   --   --   --  PROT 7.4  --   --   --   --   --   ALBUMIN 4.1 3.5 3.4* 3.3* 3.4* 2.6*   No results for input(s): LIPASE, AMYLASE in the last 168 hours.  Recent Labs Lab 01/02/16 1047  AMMONIA 42*    CBC:  Recent Labs Lab 12/30/15 0353 12/31/15 0331 01/01/16 0115 01/02/16 0400 01/03/16 0540  WBC 19.2* 16.6* 13.0* 13.3* 15.3*  NEUTROABS 14.9* 12.7* 9.3* 11.0* 11.9*  HGB 14.7 13.3 13.2 12.9 11.7*  HCT 45.1 41.2 42.1 42.2 37.1  MCV 97.2 98.1 101.9* 102.9* 102.2*  PLT 346 335 269 282 195    Cardiac Enzymes:  Recent Labs Lab 01/02/16 0932  CKTOTAL 1156*    Lipid Panel:  Recent Labs Lab 12/30/15 0353 12/31/15 0331 01/01/16 0115  TRIG 333* 333*  334* 409*    CBG:  Recent Labs Lab 01/02/16 1701 01/02/16 2011 01/03/16 0009 01/03/16 0409 01/03/16 0823  GLUCAP 122* 133* 168* 142* 100*     Microbiology: Results for orders placed or performed during the hospital encounter of 12/29/15  MRSA PCR Screening     Status: None   Collection Time: 12/29/15 10:23 AM  Result Value Ref Range Status   MRSA by PCR NEGATIVE NEGATIVE Final    Comment:        The GeneXpert MRSA Assay (FDA approved for NASAL specimens only), is one component of a comprehensive MRSA colonization surveillance program. It is not intended to diagnose MRSA infection nor to guide or monitor treatment for MRSA infections.   Culture, Urine     Status: None   Collection Time: 12/29/15 11:26 AM  Result Value Ref Range Status   Specimen Description URINE, CATHETERIZED  Final   Special Requests NONE  Final   Culture   Final    NO GROWTH 1 DAY Performed at [facility]    Report Status 12/30/2015 FINAL  Final  Culture, respiratory (NON-Expectorated)     Status: None   Collection Time: 12/29/15 11:27 AM  Result Value Ref Range Status   Specimen Description TRACHEAL ASPIRATE  Final   Special Requests NONE  Final   Gram Stain   Final    NO WBC SEEN NO SQUAMOUS EPITHELIAL CELLS SEEN MODERATE GRAM POSITIVE COCCI IN PAIRS Performed at [facility]    Culture   Final    NORMAL OROPHARYNGEAL FLORA Performed at [facility]    Report Status 01/01/2016 FINAL  Final  Culture, blood (routine x 2)     Status: None (Preliminary result)   Collection Time: 12/29/15 12:01 PM  Result Value Ref Range Status   Specimen Description BLOOD RIGHT HAND  Final   Special Requests BOTTLES DRAWN AEROBIC ONLY 5CC  Final   Culture   Final    NO GROWTH 4 DAYS Performed at [facility]    Report Status PENDING  Incomplete  Culture, blood (routine x 2)     Status: None (Preliminary result)   Collection Time: 12/29/15 12:13 PM  Result Value Ref Range Status   Specimen Description BLOOD RIGHT HAND  Final   Special Requests IN PEDIATRIC BOTTLE 1CC  Final   Culture   Final    NO GROWTH 4  DAYS Performed at [facility]    Report Status PENDING  Incomplete  Culture, blood (routine x 2)     Status: None (Preliminary result)   Collection Time: 12/31/15  1:15 AM  Result Value Ref Range Status   Specimen  Description BLOOD RIGHT ANTECUBITAL  Final   Special Requests IN PEDIATRIC BOTTLE  Final   Culture   Final    NO GROWTH 1 DAY Performed at [facility]    Report Status PENDING  Incomplete  Culture, blood (routine x 2)     Status: None (Preliminary result)   Collection Time: 12/31/15  1:20 AM  Result Value Ref Range Status   Specimen Description BLOOD R HAND  Final   Special Requests IN PEDIATRIC BOTTLE 1 ML  Final   Culture   Final    NO GROWTH 1 DAY Performed at [facility]    Report Status PENDING  Incomplete  Culture, respiratory (NON-Expectorated)     Status: None   Collection Time: 01/01/16 12:50 AM  Result Value Ref Range Status   Specimen Description TRACHEAL ASPIRATE  Final   Special Requests Normal  Final   Gram Stain   Final    FEW WBC PRESENT, PREDOMINANTLY PMN FEW SQUAMOUS EPITHELIAL CELLS PRESENT NO ORGANISMS SEEN Performed at [facility]    Culture   Final    NORMAL OROPHARYNGEAL FLORA Performed at [facility]    Report Status 01/03/2016 FINAL  Final  Culture, Urine     Status: None   Collection Time: 01/01/16  1:35 AM  Result Value Ref Range Status   Specimen Description URINE, CATHETERIZED  Final   Special Requests Normal  Final   Culture   Final    NO GROWTH 1 DAY Performed at [facility]    Report Status 01/02/2016 FINAL  Final  Culture, respiratory (NON-Expectorated)     Status: None (Preliminary result)   Collection Time: 01/02/16  9:40 AM  Result Value Ref Range Status   Specimen Description TRACHEAL ASPIRATE  Final   Special Requests Normal  Final   Gram Stain PENDING  Incomplete   Culture   Final    NO GROWTH 1 DAY Performed at [facility]    Report Status  PENDING  Incomplete    Coagulation Studies: No results for input(s): LABPROT, INR in the last 72 hours.  Imaging: Ct Head Wo Contrast  01/02/2016  CLINICAL DATA:  Status epilepticus. Drug overdose. Fever. Leukocytosis. EXAM: CT HEAD WITHOUT CONTRAST TECHNIQUE: Contiguous axial images were obtained from the base of the skull through the vertex without intravenous contrast. COMPARISON:  None. FINDINGS: No mass lesion. No midline shift. No acute hemorrhage or hematoma. No extra-axial fluid collections. No evidence of acute infarction. Brain parenchyma is normal. There is partial opacification of the paranasal sinuses. This is most likely chronic. Osseous structures are otherwise normal. IMPRESSION: No acute intracranial abnormality.  Chronic sinus disease. Electronically Signed   By: Francene Boyers M.D.   On: 01/02/2016 12:59   Dg Chest Port 1 View  01/03/2016  CLINICAL DATA:  Acute respiratory failure, drug overdose, acute encephalopathy. EXAM: PORTABLE CHEST 1 VIEW COMPARISON:  Portable chest x-ray of January 02, 2016 FINDINGS: The lungs are adequately inflated. The interstitial markings remain increased. Areas of confluent alveolar opacity persist but are slightly less conspicuous. The cardiac silhouette remains enlarged. The central pulmonary vascularity remains engorged. The endotracheal tube tip lies 3 cm above the carina. The esophagogastric tube tip projects below the inferior margin of the image. A left-sided PICC line has been placed since yesterday's study with the tip at or just above the cavoatrial junction. IMPRESSION: Slight interval improvement in the appearance of the pulmonary interstitium but considerable interstitial edema or  pneumonia process. Mild cardiomegaly with mild central pulmonary vascular congestion. The support tubes are in reasonable position though the PICC line tip does lie at or just above the cavoatrial junction. Electronically Signed   By: Dysen Edmondson  [place] M.D.   On: 01/03/2016  07:32   Dg Chest Port 1 View  01/02/2016  CLINICAL DATA:  Acute respiratory failure, hypoxemia EXAM: PORTABLE CHEST 1 VIEW COMPARISON:  01/01/2016 and 12/31/2015 FINDINGS: Cardiomediastinal silhouette is stable. Endotracheal tube in place with tip 4 cm above the carina. Stable NG tube position. There is mild interstitial prominence bilaterally with subtle patchy airspace opacities. Findings suspicious for mild interstitial edema. Superimposed patchy alveolar edema or infiltrates cannot be excluded. Clinical correlation is necessary. IMPRESSION: There is mild interstitial prominence bilaterally with subtle patchy airspace opacities. Findings suspicious for mild interstitial edema. Superimposed patchy alveolar edema or infiltrates cannot be excluded. Clinical correlation is necessary. Electronically Signed   By: Natasha Mead M.D.   On: 01/02/2016 10:48   Portable Chest Xray  01/01/2016  CLINICAL DATA:  Central line placement and gastric tube placement. Respiratory distress. EXAM: PORTABLE CHEST 1 VIEW COMPARISON:  01/01/2016 FINDINGS: Endotracheal tube remains in stable position. NG tube is in the stomach. No visible central line. No pneumothorax. Heart is borderline in size with vascular congestion. Left lower lobe atelectasis or infiltrate and possible small left effusion. IMPRESSION: Endotracheal tube and NG tube remain in place, unchanged. No visible central line. Vascular congestion. Left lower lobe atelectasis or infiltrate with possible small left effusion. Electronically Signed   By: Charlett Nose M.D.   On: 01/01/2016 12:12   Dg Abd Portable 1v  01/02/2016  CLINICAL DATA:  Ileus, distended abdomen EXAM: PORTABLE ABDOMEN - 1 VIEW COMPARISON:  01/01/2016 FINDINGS: There is normal small bowel gas pattern. NG tube in place with tip in distal stomach. Moderate gaseous distension of the right colon and transverse colon. IMPRESSION: Normal small bowel gas pattern. Moderate gaseous distension of the right and  transverse colon. NG tube in place. Electronically Signed   By: Natasha Mead M.D.   On: 01/02/2016 10:49   Dg Abd Portable 1v  01/01/2016  CLINICAL DATA:  Nasogastric tube placement EXAM: PORTABLE ABDOMEN - 1 VIEW COMPARISON:  Study obtained earlier in the day. FINDINGS: Nasogastric tube tip and side port in stomach. Stomach is no longer distended. Bowel gas pattern is unremarkable. No obstruction or free air. IMPRESSION: Nasogastric tube tip and side port in stomach. Bowel gas pattern unremarkable. Electronically Signed   By: Bretta Bang III M.D.   On: 01/01/2016 12:12    Medications:  Scheduled: . ampicillin-sulbactam (UNASYN) IV  3 g Intravenous Q6H  . antiseptic oral rinse  7 mL Mouth Rinse 10 times per day  . chlorhexidine gluconate  15 mL Mouth Rinse BID  . clonazePAM  0.5 mg Oral BID  . enoxaparin (LOVENOX) injection  40 mg Subcutaneous Q24H  . famotidine (PEPCID) IV  20 mg Intravenous Q12H  . feeding supplement (PRO-STAT SUGAR FREE 64)  30 mL Per Tube TID  . insulin aspart  0-9 Units Subcutaneous 6 times per day  . levETIRAcetam  1,000 mg Intravenous Q12H  . polyethylene glycol  17 g Oral Daily  . potassium chloride  30 mEq Per Tube Q4H  . sodium chloride  10-40 mL Intracatheter Q12H    Assessment/Plan: 44y/o woman with hx of depression, anxiety admitted 12//31 with drug overdose after taking at least 20 xanax, 12 klonopin, 6 shots and 8-9 beers. Hospital  course complicated by respiratory failure, persistent encephalopathy, leukocytosis and now with fever up to 106.7. Broad differential of her presentation. EEG showed no epileptiform activity. Ammonia 42, CK 1556, TSH WNL. Will hold off on LP as she is improving significantly.   Recommend: 1) continue Keppra 1 Gram BID 2) Continue Clonazepam   Felicie Morn PA-C [facility] [phone]  01/03/2016, 9:37 AM

## 2016-01-04 ENCOUNTER — Inpatient Hospital Stay (HOSPITAL_COMMUNITY): Payer: Medicaid Other

## 2016-01-04 DIAGNOSIS — J9600 Acute respiratory failure, unspecified whether with hypoxia or hypercapnia: Secondary | ICD-10-CM

## 2016-01-04 DIAGNOSIS — T50901A Poisoning by unspecified drugs, medicaments and biological substances, accidental (unintentional), initial encounter: Secondary | ICD-10-CM

## 2016-01-04 LAB — LACTIC ACID, PLASMA
LACTIC ACID, VENOUS: 1 mmol/L (ref 0.5–2.0)
Lactic Acid, Venous: 1.1 mmol/L (ref 0.5–2.0)
Lactic Acid, Venous: 1.1 mmol/L (ref 0.5–2.0)

## 2016-01-04 LAB — GLUCOSE, CAPILLARY
GLUCOSE-CAPILLARY: 116 mg/dL — AB (ref 65–99)
GLUCOSE-CAPILLARY: 123 mg/dL — AB (ref 65–99)
GLUCOSE-CAPILLARY: 127 mg/dL — AB (ref 65–99)
Glucose-Capillary: 113 mg/dL — ABNORMAL HIGH (ref 65–99)
Glucose-Capillary: 108 mg/dL — ABNORMAL HIGH (ref 65–99)
Glucose-Capillary: 129 mg/dL — ABNORMAL HIGH (ref 65–99)

## 2016-01-04 LAB — PHOSPHORUS: PHOSPHORUS: 1.4 mg/dL — AB (ref 2.5–4.6)

## 2016-01-04 LAB — RENAL FUNCTION PANEL
Albumin: 2.2 g/dL — ABNORMAL LOW (ref 3.5–5.0)
Anion gap: 6 (ref 5–15)
BUN: 16 mg/dL (ref 6–20)
CHLORIDE: 122 mmol/L — AB (ref 101–111)
CO2: 27 mmol/L (ref 22–32)
CREATININE: 1 mg/dL (ref 0.44–1.00)
Calcium: 8.5 mg/dL — ABNORMAL LOW (ref 8.9–10.3)
GFR calc Af Amer: >60 mL/min (ref >60)
GLUCOSE: 156 mg/dL — AB (ref 65–99)
POTASSIUM: 3.5 mmol/L (ref 3.5–5.1)
Phosphorus: 1.3 mg/dL — ABNORMAL LOW (ref 2.5–4.6)
Sodium: 155 mmol/L — ABNORMAL HIGH (ref 135–145)

## 2016-01-04 LAB — CBC WITH DIFFERENTIAL/PLATELET
BASOS ABS: 0 10*3/uL (ref 0.0–0.1)
BASOS PCT: 0 %
Eosinophils Absolute: 0.1 10*3/uL (ref 0.0–0.7)
Eosinophils Relative: 1 %
HEMATOCRIT: 35 % — AB (ref 36.0–46.0)
HEMOGLOBIN: 10.9 g/dL — AB (ref 12.0–15.0)
Lymphocytes Relative: 19 %
Lymphs Abs: 2.2 10*3/uL (ref 0.7–4.0)
MCH: 31.2 pg (ref 26.0–34.0)
MCHC: 31.1 g/dL (ref 30.0–36.0)
MCV: 100.3 fL — ABNORMAL HIGH (ref 78.0–100.0)
Monocytes Absolute: 0.5 10*3/uL (ref 0.1–1.0)
Monocytes Relative: 5 %
Neutro Abs: 8.4 10*3/uL — ABNORMAL HIGH (ref 1.7–7.7)
Neutrophils Relative %: 75 %
Platelets: 192 10*3/uL (ref 150–400)
RBC: 3.49 MIL/uL — ABNORMAL LOW (ref 3.87–5.11)
RDW: 13.5 % (ref 11.5–15.5)
WBC: 11.1 10*3/uL — ABNORMAL HIGH (ref 4.0–10.5)

## 2016-01-04 LAB — BASIC METABOLIC PANEL
ANION GAP: 7 (ref 5–15)
BUN: 16 mg/dL (ref 6–20)
CALCIUM: 8.5 mg/dL — AB (ref 8.9–10.3)
CHLORIDE: 122 mmol/L — AB (ref 101–111)
CO2: 26 mmol/L (ref 22–32)
Creatinine, Ser: 0.94 mg/dL (ref 0.44–1.00)
GFR calc non Af Amer: >60 mL/min (ref >60)
Glucose, Bld: 155 mg/dL — ABNORMAL HIGH (ref 65–99)
Potassium: 3.4 mmol/L — ABNORMAL LOW (ref 3.5–5.1)
Sodium: 155 mmol/L — ABNORMAL HIGH (ref 135–145)

## 2016-01-04 LAB — CULTURE, RESPIRATORY: SPECIAL REQUESTS: NORMAL

## 2016-01-04 LAB — CULTURE, RESPIRATORY W GRAM STAIN

## 2016-01-04 LAB — MAGNESIUM: MAGNESIUM: 1.9 mg/dL (ref 1.7–2.4)

## 2016-01-04 LAB — CK
CK TOTAL: 3594 U/L — AB (ref 38–234)
Total CK: 4773 U/L — ABNORMAL HIGH (ref 38–234)

## 2016-01-04 LAB — PROCALCITONIN: PROCALCITONIN: 0.71 ng/mL

## 2016-01-04 MED ORDER — IPRATROPIUM-ALBUTEROL 0.5-2.5 (3) MG/3ML IN SOLN: 3.0000 mL | RESPIRATORY_TRACT | Status: DC | PRN

## 2016-01-04 MED ORDER — CLONAZEPAM 0.5 MG PO TABS
0.5000 mg | ORAL_TABLET | Freq: Two times a day (BID) | ORAL | Status: DC | PRN
  Administered 2016-01-04: 0.5 mg via ORAL

## 2016-01-04 MED ORDER — LORAZEPAM 2 MG/ML IJ SOLN
0.5000 mg | Freq: Once | INTRAMUSCULAR | Status: DC | PRN
  Filled 2016-01-04: qty 1

## 2016-01-04 MED ORDER — DEXTROSE 5 % IV SOLN
INTRAVENOUS | Status: DC
  Administered 2016-01-04 – 2016-01-07 (×8): via INTRAVENOUS

## 2016-01-04 MED ORDER — POTASSIUM PHOSPHATES 15 MMOLE/5ML IV SOLN
40.0000 meq | Freq: Once | INTRAVENOUS | Status: AC
  Administered 2016-01-04: 40 meq via INTRAVENOUS
  Filled 2016-01-04: qty 9.09

## 2016-01-04 MED ORDER — WHITE PETROLATUM GEL
Status: AC
  Administered 2016-01-04: 0.2
  Filled 2016-01-04: qty 1

## 2016-01-04 MED ORDER — GI COCKTAIL ~~LOC~~
30.0000 mL | Freq: Once | ORAL | Status: AC
  Administered 2016-01-04: 30 mL via ORAL
  Filled 2016-01-04 (×2): qty 30

## 2016-01-04 NOTE — Procedures (Signed)
Continuous 7-1/2 hour Long Term video EEG Monitoring  Monitoring occurred on January 03, 2016.  Requesting Physician: Dr. Arie SabinaKaveer Nandigam  Reading Physician: Dr. Sabra Heckhristopher Marlin Jarrard  History: This is a 45 year old white female who presents with persistent agitation and confusion.  Medications: Klonopin, Keppra and Versed.  Technique: This was a continuous  7 1/2  hour 17 channel video EEG monitoring with 16 channels of EEG as well as one channel of EKG which was  performed during a state of stupor.  Neither photic stimulation nor hyperventilation are performed as activating procedures.  Description: As the tracing opens, the background consists of some generalized 5-6 Hz delta theta activity with a voltage range of 14-42 V.  As the  recording continued, the frequency of the background increased to a 6-7 Hz delta theta background with a voltage range of 7-35 V.  There was some T6 electrode artifact noted.  There were no asymmetries or epileptiform discharges noted.  There are also no push button events noted.  Impression: This was an abnormal 7 1/2  hour continuous video EEG monitoring as demonstrated by the presence is some moderate diffuse slowing.  Clinical Correlation: The above findings are consistent with a moderate diffuse or multifocal encephalopathy as can be seen secondary medication effect, infection or other diffuse metabolic abnormalities.   Sabra Heckhristopher Joeanthony Seeling, M.D.

## 2016-01-04 NOTE — Progress Notes (Signed)
eLink Physician-Brief Progress Note Patient Name: Stacie Sanders DOB: 02-28-1971 MRN: [id number]   Date of Service  01/04/2016  HPI/Events of Note  rn says patient very anxious. On home klonopin. Not on it currently. She is restless. This makes her cough as well  eICU Interventions  Klonopin home prn regimen restart If does well this then can advance diet     Intervention Category Intermediate Interventions: Other:  Kainen Struckman 01/04/2016, 6:34 PM

## 2016-01-04 NOTE — Progress Notes (Signed)
PULMONARY / CRITICAL CARE MEDICINE   Name: Stacie Sanders MRN: [id number] DOB: 1971/08/31    ADMISSION DATE:  12/29/2015 CONSULTATION DATE:  12/29/15  REFERRING MD:  Nicanor Alcon  CHIEF COMPLAINT:  Drug overdose  MICROBIOLOGY  1/4 Resp culture >  1/4 Blood Cx >  1/4 Resp Cx >  1/4 Urine Cx > neg 1/4 Influenza - negative      ANTIBIOTICS: Vancomycin 12/31>> 1/2 Zosyn 12/31>> 1/2 , Unasyn 1/2 >1/5 (? Drug fever)  1/5 levaquin>>  LINES/TUBES: ETT 12/29/15 > 1/3>>1/6 Foley 12/29/15 >   SIGNIFICANT EVENTS: 12/31 - Admit & Intubated by EDP 1/3 extubated, re-intubated for stridor 1/4 Fever to 106.7, persistent agitation 1/6 extubation 1/6 d5w for Na 155  SUBJECTIVE:  01/03/16:Failed SBT earlier - was on high dose range of precedex . SEt off apnea alarms . Gets very agitated when not on sedation. Fever rose to 107F  dramatically improved - after Rx wuth  arctic sun pads 37.2. Marland Kitchen WBC improved. Seizure free on EEG per neuro  VITAL SIGNS: BP 114/65 mmHg  Pulse 75  Temp(Src) 101.1 F (38.4 C) (Axillary)  Resp 16  Ht 5\' 8"  (1.727 m)  Wt 241 lb 2.9 oz (109.4 kg)  BMI 36.68 kg/m2  SpO2 100%  LMP   HEMODYNAMICS:    VENTILATOR SETTINGS: Vent Mode:  [-] PRVC FiO2 (%):  [40 %] 40 % Set Rate:  [14 bmp] 14 bmp Vt Set:  [510 mL] 510 mL PEEP:  [5 cmH20] 5 cmH20 Pressure Support:  [5 cmH20] 5 cmH20 Plateau Pressure:  [17 cmH20-20 cmH20] 17 cmH20  INTAKE / OUTPUT: I/O last 3 completed shifts: In: 5344.3 [I.V.:4210.3; NG/GT:214; IV Piggyback:920] Out: 3440 [Urine:3240; Emesis/NG output:200]  PHYSICAL EXAMINATION: General: Awake and alert on vent PULM: +rhonchi CV: RRR, no mgr GI: BS+, soft, nontender MSK: normal bulk and tone Derm: normal skin tone, dry, warm Neuro: RASS 0 off sedation LABS: PULMONARY  Recent Labs Lab 12/29/15 0038 12/29/15 0203 12/29/15 0359 01/01/16 1245 01/02/16 0932  PHART  --  7.279* 7.259* 7.372 7.333*  PCO2ART  --  43.9 50.6* 39.1 45.4*   PO2ART  --  93.3 182* 190* 82.0  HCO3  --  19.9* 21.9 22.2 22.1  TCO2 25 17.9 19.5 20.0 20.0  O2SAT  --  96.1 98.7 99.1 91.2    CBC  Recent Labs Lab 01/02/16 0400 01/03/16 0540 01/04/16 0510  HGB 12.9 11.7* 10.9*  HCT 42.2 37.1 35.0*  WBC 13.3* 15.3* 11.1*  PLT 282 195 192    COAGULATION No results for input(s): INR in the last 168 hours.  CARDIAC  No results for input(s): TROPONINI in the last 168 hours. No results for input(s): PROBNP in the last 168 hours.   CHEMISTRY  Recent Labs Lab 12/30/15 0353  01/01/16 0115 01/02/16 0400 01/03/16 0540 01/04/16 0510 01/04/16 0511  NA 142  < > 147* 149*  149* 151*  150* 155* 155*  K 3.4*  < > 3.8 4.2  4.2 3.0*  3.0* 3.4* 3.5  CL 106  < > 115* 115*  116* 120*  118* 122* 122*  CO2 24  < > 22 25  24 22  22 26 27   GLUCOSE 135*  < > 150* 159*  157* 152*  151* 155* 156*  BUN 8  < > 13 19  18 19  19 16 16   CREATININE 0.71  < > 0.97 1.04*  1.05* 1.14*  1.15* 0.94 1.00  CALCIUM 8.6*  < > 8.6*  8.7*  8.7* 8.0*  8.0* 8.5* 8.5*  MG 1.7  --   --   --   --  1.9  --   PHOS 2.2*  < > 3.0 1.6* 3.0 1.4* 1.3*  < > = values in this interval not displayed. Estimated Creatinine Clearance: 95 mL/min (by C-G formula based on Cr of 1).   LIVER  Recent Labs Lab 12/29/15 0028  12/31/15 0331 01/01/16 0115 01/02/16 0400 01/03/16 0540 01/04/16 0511  AST 41  --   --   --   --   --   --   ALT 52  --   --   --   --   --   --   ALKPHOS 122  --   --   --   --   --   --   BILITOT 0.5  --   --   --   --   --   --   PROT 7.4  --   --   --   --   --   --   ALBUMIN 4.1  < > 3.4* 3.3* 3.4* 2.6* 2.2*  < > = values in this interval not displayed.   INFECTIOUS  Recent Labs Lab 12/31/15 0331  01/03/16 1030 01/03/16 1738 01/04/16 0510 01/04/16 0513  LATICACIDVEN  --   < > 0.9 1.1  --  1.0  PROCALCITON <0.10  --  0.98  --  0.71  --   < > = values in this interval not displayed.   ENDOCRINE CBG (last 3)   Recent Labs   01/03/16 2003 01/03/16 2351 01/04/16 0402  GLUCAP 139* 127* 113*         IMAGING x48h  - image(s) personally visualized  -   highlighted in bold Ct Head Wo Contrast  01/02/2016  CLINICAL DATA:  Status epilepticus. Drug overdose. Fever. Leukocytosis. EXAM: CT HEAD WITHOUT CONTRAST TECHNIQUE: Contiguous axial images were obtained from the base of the skull through the vertex without intravenous contrast. COMPARISON:  None. FINDINGS: No mass lesion. No midline shift. No acute hemorrhage or hematoma. No extra-axial fluid collections. No evidence of acute infarction. Brain parenchyma is normal. There is partial opacification of the paranasal sinuses. This is most likely chronic. Osseous structures are otherwise normal. IMPRESSION: No acute intracranial abnormality.  Chronic sinus disease. Electronically Signed   By: Francene Boyers M.D.   On: 01/02/2016 12:59   Dg Chest Port 1 View  01/03/2016  CLINICAL DATA:  Acute respiratory failure, drug overdose, acute encephalopathy. EXAM: PORTABLE CHEST 1 VIEW COMPARISON:  Portable chest x-ray of January 02, 2016 FINDINGS: The lungs are adequately inflated. The interstitial markings remain increased. Areas of confluent alveolar opacity persist but are slightly less conspicuous. The cardiac silhouette remains enlarged. The central pulmonary vascularity remains engorged. The endotracheal tube tip lies 3 cm above the carina. The esophagogastric tube tip projects below the inferior margin of the image. A left-sided PICC line has been placed since yesterday's study with the tip at or just above the cavoatrial junction. IMPRESSION: Slight interval improvement in the appearance of the pulmonary interstitium but considerable interstitial edema or pneumonia process. Mild cardiomegaly with mild central pulmonary vascular congestion. The support tubes are in reasonable position though the PICC line tip does lie at or just above the cavoatrial junction. Electronically Signed    By: David  [place] M.D.   On: 01/03/2016 07:32   Dg Chest Port 1 View  01/02/2016  CLINICAL DATA:  Acute respiratory failure, hypoxemia EXAM: PORTABLE CHEST 1 VIEW COMPARISON:  01/01/2016 and 12/31/2015 FINDINGS: Cardiomediastinal silhouette is stable. Endotracheal tube in place with tip 4 cm above the carina. Stable NG tube position. There is mild interstitial prominence bilaterally with subtle patchy airspace opacities. Findings suspicious for mild interstitial edema. Superimposed patchy alveolar edema or infiltrates cannot be excluded. Clinical correlation is necessary. IMPRESSION: There is mild interstitial prominence bilaterally with subtle patchy airspace opacities. Findings suspicious for mild interstitial edema. Superimposed patchy alveolar edema or infiltrates cannot be excluded. Clinical correlation is necessary. Electronically Signed   By: Natasha Mead M.D.   On: 01/02/2016 10:48   Dg Abd Portable 1v  01/02/2016  CLINICAL DATA:  Ileus, distended abdomen EXAM: PORTABLE ABDOMEN - 1 VIEW COMPARISON:  01/01/2016 FINDINGS: There is normal small bowel gas pattern. NG tube in place with tip in distal stomach. Moderate gaseous distension of the right colon and transverse colon. IMPRESSION: Normal small bowel gas pattern. Moderate gaseous distension of the right and transverse colon. NG tube in place. Electronically Signed   By: Natasha Mead M.D.   On: 01/02/2016 10:49        ASSESSMENT / PLAN:  NEUROLOGIC A:   Persistent agitation, now with high fever: etiology uncertain: DT's? Benzo withdrawal? Serotonin, NMS, Malignant hyperthremia don't fit with labs, exam or medications Benzo overdose initially  Chronic benzodiazepine abuse Suicide Attempt  - 01/03/2016 ->  Reports of agitation when percedex weaned. Fever improved but on arctic sun 37.69F. Neuro repiorts no seizuer on EEG. LP ? indicated  P:   Precedex gtt - cut down dose to improve sedation score for RASS goal 0 Neuro to opine on LP Continue  clonazepam bid Prn versed Contn fent gtt - turn off for better [facility] Psychiatry consult once patient extubated & suicide precautions  PULMONARY A: Acute Hypoxic Respiratory Failure > resolved Aspiration Pneumonitis > better Post extubation stridor > emergent re-intubation 1/6 post decadron dosessing     P:   Decadron x6 total doses started 01/01/16 Plan extubation 1-6 Continue full vent support VAP prevention Oral care protocol  CARDIOVASCULAR A:  Nil acute P:  Monitor patient on telemetry Vitals per unit protocol  RENAL  Recent Labs Lab 01/03/16 0540 01/04/16 0510 01/04/16 0511  K 3.0*  3.0* 3.4* 3.5    Recent Labs Lab 01/03/16 0540 01/04/16 0510 01/04/16 0511  NA 151*  150* 155* 155*     A:   Hypokalemia  Mild rhabdo CK 1100s on 11/01/16 - peaked at 6624 on 1/5 on 1/6  3594 goes along with drug syndromes  P:   KCl repletion per elink Monitor BMET and UOP Replace electrolytes as needed(kphos 1/6) Follow CK IVF d5 w at 100 hr for na 155  GASTROINTESTINAL A:   Nausea/vomiting - none on 01/04/16 P:   Zantac bid zofran prn TF consult  DC FT when extubated Advance diet as tolerated  HEMATOLOGIC A:   No acute issues  P:  Monitor for bleeding Lovenox [place] daily  SCDs  INFECTIOUS A: HCAP/Aspiration pneumonia  At admit 12/29/2015 High grade fever 107F - on 01/02/16   -escalation after start unasyn 12/31/15  - normal PCT and lactate and negative cultures so far as of 01/03/16  - CK elevated  - Ddx is sepsis v drug fever v drug syndromes v some combo  01/03/16 - fever controlled but on arctic sun pads @ 37.69F 1/6 fever 103 off arctic sun    P:  monitor fever  off arctic sun pads await Re culture 01/02/16 DC unasyn - possible drug fever Start levaquin (check QTc on 12 lead) - and start if Qtc < 500msec (it is 0.35 on monitor) 1/6 procal->0.71 Tylenol   ENDOCRINE CBG (last 3)   Recent Labs  01/03/16 2003 01/03/16 2351 01/04/16 0402   GLUCAP 139* 127* 113*     A:   Hyperglycemia - No h/o DM    P:   Accu-Checks q4hr Low dose SSI per algorithm    PSYCHIATRIC A: Suicide Attempt with Overdose H/O Depression/Anxiety  P: Suicide precautions and psych consult once extubated   FAMILY  - Updates: Husband updated 1/6 per SM. - Inter-disciplinary family meet or Palliative Care meeting due by:  01/04/16   Summary: 1/6 extubated. Neuro intact. Hypernatremia and fever addressed.    Brett CanalesSteve Minor ACNP [facility] Pager [phone] till 3 pm If no answer page [phone] 01/04/2016, 9:11 AM   STAFF NOTE: I, Rory Percyaniel Basil Blakesley, MD FACP have personally reviewed patient's available data, including medical history, events of note, physical examination and test results as part of my evaluation. I have discussed with resident/NP and other care providers such as pharmacist, RN and RRT. In addition, I personally evaluated patient and elicited key findings of: Awake, ronchi on examination, we ordered pcxr now which is concerning for aspiration infiltrates / atx, less likely edema with NA 154, she has excellent mechanics on vent , her Mental status is awesome, she has strong cough and min secretions, she has leak, I think we have a window of opportunity for extubation we should take and extubate and watch closely in icu, I would not want to have her move off unit with prior stridor, free water needed, I updated pt and husband at bedside The patient is critically ill with multiple organ systems failure and requires high complexity decision making for assessment and support, frequent evaluation and titration of therapies, application of advanced monitoring technologies and extensive interpretation of multiple databases.   Critical Care Time devoted to patient care services described in this note is35 Minutes. This time reflects time of care of this signee: Rory Percyaniel Adriane Guglielmo, MD FACP. This critical care time does not reflect procedure time,  or teaching time or supervisory time of PA/NP/Med student/Med Resident etc but could involve care discussion time. Rest per NP/medical resident whose note is outlined above and that I agree with   Mcarthur Rossettianiel J. Tyson AliasFeinstein, MD, FACP Pgr: [phone] [facility] 01/04/2016 2:07 PM

## 2016-01-04 NOTE — Procedures (Signed)
Extubation Procedure Note  Patient Details:   Name: Andre Lefortracy L Loder DOB: 13-Jan-1971 MRN: [id number]   Airway Documentation:     Evaluation  O2 sats: stable throughout Complications: No apparent complications Patient did tolerate procedure well. Bilateral Breath Sounds: Clear, Diminished Suctioning: Oral, Airway Yes   Pt extubated to 3L [place]. Pt able to speak her name but has a weak non productive cough. MD at bedside. Pt encouraged to cough and use [place] to clear secretions. Pt will be monitored closely by RT.   Carolan ShiverKelley, Kennadi [place] M 01/04/2016, 9:40 AM

## 2016-01-04 NOTE — Progress Notes (Signed)
Dr. Tyson AliasFeinstein wanted to delay today's MRI pending consultation with neurology.

## 2016-01-04 NOTE — Progress Notes (Signed)
eLink Physician-Brief Progress Note Patient Name: Stacie Sanders DOB: Apr 11, 1971 MRN: [id number]   Date of Service  01/04/2016  HPI/Events of Note  BMET    Component Value Date/Time   NA 155* 01/04/2016 0511   K 3.5 01/04/2016 0511   CL 122* 01/04/2016 0511   CO2 27 01/04/2016 0511   GLUCOSE 156* 01/04/2016 0511   BUN 16 01/04/2016 0511   CREATININE 1.00 01/04/2016 0511   CALCIUM 8.5* 01/04/2016 0511   GFRNONAA >60 01/04/2016 0511   GFRAA >60 01/04/2016 0511  Phos 2.2   eICU Interventions  K phos given      Intervention Category Intermediate Interventions: Electrolyte abnormality - evaluation and management  BYRUM,ROBERT S. 01/04/2016, 5:48 AM

## 2016-01-04 NOTE — Progress Notes (Signed)
Interval History:                                                                                                                                         Stacie Sanders is an 45 y.o. female patient  with benzodiazepine overdose, now extubated. She has a significant psychiatric comorbidity in the past medical history. Extubated this morning. She has been complaining of right-sided weakness to nursing staff.     Past Medical History  Diagnosis Date  . Depression   . Kidney stone     Past Surgical History  Procedure Laterality Date  . Tubal ligation    . Lithotripsy      History reviewed. No pertinent family history. Social History:  reports that she has been smoking Cigarettes.  She does not have any smokeless tobacco history on file. She reports that she drinks alcohol. She reports that she does not use illicit drugs.  Allergies:  Allergies  Allergen Reactions  . Fentanyl Itching  . Percocet [Oxycodone-Acetaminophen] Hives and Itching    Medications:                                                                                                                          Current facility-administered medications:  .  0.9 %  sodium chloride infusion, 250 mL, Intravenous, PRN, Orville Govern, MD, Stopped at 01/03/16 2026 .  acetaminophen (TYLENOL) solution 650 mg, 650 mg, Per Tube, Q6H PRN, Roslynn Amble, MD, 650 mg at 01/04/16 0452 .  acetaminophen (TYLENOL) suppository 650 mg, 650 mg, Rectal, Q6H PRN, Lupita Leash, MD, 650 mg at 01/04/16 0849 .  antiseptic oral rinse solution (CORINZ), 7 mL, Mouth Rinse, 10 times per day, Roslynn Amble, MD, 7 mL at 01/04/16 1111 .  bisacodyl (DULCOLAX) EC tablet 5 mg, 5 mg, Oral, Daily PRN, Lupita Leash, MD .  chlorhexidine gluconate (PERIDEX) 0.12 % solution 15 mL, 15 mL, Mouth Rinse, BID, Lupita Leash, MD, 15 mL at 01/04/16 0821 .  clonazePAM (KLONOPIN) tablet 0.5 mg, 0.5 mg, Oral, BID, Lupita Leash, MD, 0.5 mg  at 01/03/16 2026 .  dextrose 5 % solution, , Intravenous, Continuous, William S Minor, NP, Last Rate: 100 mL/hr at 01/04/16 1004 .  docusate sodium (COLACE) capsule 100 mg, 100 mg, Oral, BID PRN, Roslynn Amble, MD .  enoxaparin (LOVENOX)  injection 40 mg, 40 mg, Subcutaneous, Q24H, Orville GovernSarah Ellen Stephens, MD, 40 mg at 01/04/16 1010 .  famotidine (PEPCID) IVPB 20 mg premix, 20 mg, Intravenous, Q12H, Lupita Leashouglas B McQuaid, MD, 20 mg at 01/04/16 1006 .  feeding supplement (PRO-STAT SUGAR FREE 64) liquid 30 mL, 30 mL, Per Tube, TID, Tilda FrancoLindsey Baker, RD, Stopped at 01/04/16 1000 .  fentaNYL (SUBLIMAZE) 2,500 mcg in sodium chloride 0.9 % 250 mL (10 mcg/mL) infusion, 25-400 mcg/hr, Intravenous, Continuous, Lupita Leashouglas B McQuaid, MD, Stopped at 01/04/16 0820 .  ibuprofen (ADVIL,MOTRIN) 100 MG/5ML suspension 400 mg, 400 mg, Per Tube, Q6H PRN, Roslynn AmbleJennings E Nestor, MD, 400 mg at 01/01/16 2233 .  insulin aspart (novoLOG) injection 0-9 Units, 0-9 Units, Subcutaneous, 6 times per day, Roslynn AmbleJennings E Nestor, MD, 1 Units at 01/04/16 0014 .  levETIRAcetam (KEPPRA) 1,000 mg in sodium chloride 0.9 % 100 mL IVPB, 1,000 mg, Intravenous, Q12H, Ramond MarrowPeter J Sumner, DO, 1,000 mg at 01/04/16 1103 .  levofloxacin (LEVAQUIN) IVPB 500 mg, 500 mg, Intravenous, Q24H, Kalman ShanMurali Ramaswamy, MD, 500 mg at 01/04/16 1111 .  LORazepam (ATIVAN) injection 0.5 mg, 0.5 mg, Intravenous, Once PRN, Larren Copes Daniel NonesNarayan Kaveer Patina Spanier, MD .  ondansetron [facility](ZOFRAN) injection 4 mg, 4 mg, Intravenous, Q6H PRN, Lupita Leashouglas B McQuaid, MD .  polyethylene glycol (MIRALAX / GLYCOLAX) packet 17 g, 17 g, Oral, Daily, Lupita Leashouglas B McQuaid, MD, Stopped at 01/03/16 1000 .  sennosides (SENOKOT) 8.8 MG/5ML syrup 5 mL, 5 mL, Per Tube, Daily PRN, Lupita Leashouglas B McQuaid, MD .  sodium chloride 0.9 % injection 10-40 mL, 10-40 mL, Intracatheter, Q12H, Roslynn AmbleJennings E Nestor, MD, 20 mL at 01/02/16 2236 .  sodium chloride 0.9 % injection 10-40 mL, 10-40 mL, Intracatheter, PRN, Roslynn AmbleJennings E Nestor,  MD     Neurologic Examination:                                                                                                      Blood pressure 120/66, pulse 90, temperature 103 F (39.4 C), temperature source Axillary, resp. rate 21, height 5\' 8"  (1.727 m), weight 109.4 kg (241 lb 2.9 oz), SpO2 90 %.  Evaluation of higher integrative functions including: Level of alertness: drowsy, but easily arousable to voice.  Oriented to time, place and person  Speech: fluent, no evidence of dysarthria or aphasia noted.  Test the following cranial nerves: 2-12 grossly intact Motor examination: Normal tone, bulk, full 5/5 motor strength in all 4 extremities. Minimal give away on right improved to full strength with encouragement.  Examination of sensation : Normal and symmetric sensation to pinprick in all 4 extremities and on face Examination of deep tendon reflexes: 2+, normal and symmetric in all extremities, normal plantars bilaterally Test coordination: Normal finger nose testing, with no evidence of limb appendicular ataxia or abnormal involuntary movements or tremors noted.  Gait: Deferred   Lab Results: Basic Metabolic Panel:  Recent Labs Lab 12/30/15 0353  01/01/16 0115 01/02/16 0400 01/03/16 0540 01/04/16 0510 01/04/16 0511  NA 142  < > 147* 149*  149* 151*  150* 155* 155*  K 3.4*  < >  3.8 4.2  4.2 3.0*  3.0* 3.4* 3.5  CL 106  < > 115* 115*  116* 120*  118* 122* 122*  CO2 24  < > 22 25  24 22  22 26 27   GLUCOSE 135*  < > 150* 159*  157* 152*  151* 155* 156*  BUN 8  < > 13 19  18 19  19 16 16   CREATININE 0.71  < > 0.97 1.04*  1.05* 1.14*  1.15* 0.94 1.00  CALCIUM 8.6*  < > 8.6* 8.7*  8.7* 8.0*  8.0* 8.5* 8.5*  MG 1.7  --   --   --   --  1.9  --   PHOS 2.2*  < > 3.0 1.6* 3.0 1.4* 1.3*  < > = values in this interval not displayed.  Liver Function Tests:  Recent Labs Lab 12/29/15 0028  12/31/15 0331 01/01/16 0115 01/02/16 0400 01/03/16 0540  01/04/16 0511  AST 41  --   --   --   --   --   --   ALT 52  --   --   --   --   --   --   ALKPHOS 122  --   --   --   --   --   --   BILITOT 0.5  --   --   --   --   --   --   PROT 7.4  --   --   --   --   --   --   ALBUMIN 4.1  < > 3.4* 3.3* 3.4* 2.6* 2.2*  < > = values in this interval not displayed. No results for input(s): LIPASE, AMYLASE in the last 168 hours.  Recent Labs Lab 01/02/16 1047  AMMONIA 42*    CBC:  Recent Labs Lab 12/31/15 0331 01/01/16 0115 01/02/16 0400 01/03/16 0540 01/04/16 0510  WBC 16.6* 13.0* 13.3* 15.3* 11.1*  NEUTROABS 12.7* 9.3* 11.0* 11.9* 8.4*  HGB 13.3 13.2 12.9 11.7* 10.9*  HCT 41.2 42.1 42.2 37.1 35.0*  MCV 98.1 101.9* 102.9* 102.2* 100.3*  PLT 335 269 282 195 192    Cardiac Enzymes:  Recent Labs Lab 01/02/16 0932 01/03/16 1030 01/03/16 1736 01/03/16 2306 01/04/16 0510  CKTOTAL 1156* 5168* 6624* 4773* 3594*  CKMB  --  31.4*  --   --   --     Lipid Panel:  Recent Labs Lab 12/30/15 0353 12/31/15 0331 01/01/16 0115  TRIG 333* 333*  334* 409*    CBG:  Recent Labs Lab 01/03/16 1610 01/03/16 2003 01/03/16 2351 01/04/16 0402 01/04/16 0913  GLUCAP 139* 139* 127* 113* 108*    Microbiology: Results for orders placed or performed during the hospital encounter of 12/29/15  MRSA PCR Screening     Status: None   Collection Time: 12/29/15 10:23 AM  Result Value Ref Range Status   MRSA by PCR NEGATIVE NEGATIVE Final    Comment:        The GeneXpert MRSA Assay (FDA approved for NASAL specimens only), is one component of a comprehensive MRSA colonization surveillance program. It is not intended to diagnose MRSA infection nor to guide or monitor treatment for MRSA infections.   Culture, Urine     Status: None   Collection Time: 12/29/15 11:26 AM  Result Value Ref Range Status   Specimen Description URINE, CATHETERIZED  Final   Special Requests NONE  Final   Culture   Final    NO GROWTH 1  DAY Performed at  [facility]    Report Status 12/30/2015 FINAL  Final  Culture, respiratory (NON-Expectorated)     Status: None   Collection Time: 12/29/15 11:27 AM  Result Value Ref Range Status   Specimen Description TRACHEAL ASPIRATE  Final   Special Requests NONE  Final   Gram Stain   Final    NO WBC SEEN NO SQUAMOUS EPITHELIAL CELLS SEEN MODERATE GRAM POSITIVE COCCI IN PAIRS Performed at [facility]    Culture   Final    NORMAL OROPHARYNGEAL FLORA Performed at [facility]    Report Status 01/01/2016 FINAL  Final  Culture, blood (routine x 2)     Status: None   Collection Time: 12/29/15 12:01 PM  Result Value Ref Range Status   Specimen Description BLOOD RIGHT HAND  Final   Special Requests BOTTLES DRAWN AEROBIC ONLY 5CC  Final   Culture   Final    NO GROWTH 5 DAYS Performed at [facility]    Report Status 01/03/2016 FINAL  Final  Culture, blood (routine x 2)     Status: None   Collection Time: 12/29/15 12:13 PM  Result Value Ref Range Status   Specimen Description BLOOD RIGHT HAND  Final   Special Requests IN PEDIATRIC BOTTLE 1CC  Final   Culture   Final    NO GROWTH 5 DAYS Performed at [facility]    Report Status 01/03/2016 FINAL  Final  Culture, blood (routine x 2)     Status: None (Preliminary result)   Collection Time: 12/31/15  1:15 AM  Result Value Ref Range Status   Specimen Description BLOOD RIGHT ANTECUBITAL  Final   Special Requests IN PEDIATRIC BOTTLE  Final   Culture   Final    NO GROWTH 2 DAYS Performed at [facility]    Report Status PENDING  Incomplete  Culture, blood (routine x 2)     Status: None (Preliminary result)   Collection Time: 12/31/15  1:20 AM  Result Value Ref Range Status   Specimen Description BLOOD R HAND  Final   Special Requests IN PEDIATRIC BOTTLE 1 ML  Final   Culture   Final    NO GROWTH 2 DAYS Performed at [facility]    Report Status PENDING  Incomplete  Culture,  respiratory (NON-Expectorated)     Status: None   Collection Time: 01/01/16 12:50 AM  Result Value Ref Range Status   Specimen Description TRACHEAL ASPIRATE  Final   Special Requests Normal  Final   Gram Stain   Final    FEW WBC PRESENT, PREDOMINANTLY PMN FEW SQUAMOUS EPITHELIAL CELLS PRESENT NO ORGANISMS SEEN Performed at [facility]    Culture   Final    NORMAL OROPHARYNGEAL FLORA Performed at [facility]    Report Status 01/03/2016 FINAL  Final  Culture, Urine     Status: None   Collection Time: 01/01/16  1:35 AM  Result Value Ref Range Status   Specimen Description URINE, CATHETERIZED  Final   Special Requests Normal  Final   Culture   Final    NO GROWTH 1 DAY Performed at [facility]    Report Status 01/02/2016 FINAL  Final  Culture, blood (Routine X 2) w Reflex to ID Panel     Status: None (Preliminary result)   Collection Time: 01/02/16  9:30 AM  Result Value Ref Range Status   Specimen Description BLOOD RIGHT ARM  Final   Special Requests IN PEDIATRIC BOTTLE 0.5CC  Final   Culture   Final    NO GROWTH 1 DAY Performed at [facility]    Report Status PENDING  Incomplete  Culture, blood (Routine X 2) w Reflex to ID Panel     Status: None (Preliminary result)   Collection Time: 01/02/16  9:35 AM  Result Value Ref Range Status   Specimen Description BLOOD LEFT ARM  Final   Special Requests IN PEDIATRIC BOTTLE 1CC  Final   Culture   Final    NO GROWTH 1 DAY Performed at [facility]    Report Status PENDING  Incomplete  Culture, respiratory (NON-Expectorated)     Status: None   Collection Time: 01/02/16  9:40 AM  Result Value Ref Range Status   Specimen Description TRACHEAL ASPIRATE  Final   Special Requests Normal  Final   Gram Stain   Final    ABUNDANT WBC PRESENT, PREDOMINANTLY PMN RARE SQUAMOUS EPITHELIAL CELLS PRESENT NO ORGANISMS SEEN Performed at [facility]    Culture   Final    MODERATE  YEAST Performed at [facility]    Report Status 01/04/2016 FINAL  Final    Coagulation Studies: @LABLAST5 (labprot:5,INR:5)@  Imaging: Dg Abd 1 View  01/01/2016  CLINICAL DATA:  Nasogastric tube placement.  Initial encounter. EXAM: ABDOMEN - 1 VIEW COMPARISON:  Abdominal radiograph performed 12/29/2015 FINDINGS: The patient's enteric tube is noted ending overlying the body of the stomach. The stomach is largely filled with air. The visualized bowel gas pattern is grossly unremarkable. No free intra-abdominal air is identified, though evaluation for free air is limited on supine views. No acute osseous abnormalities are seen. IMPRESSION: Enteric tube noted ending overlying the body of the stomach. Electronically Signed   By: Roanna Raider M.D.   On: 01/01/2016 01:53   Ct Head Wo Contrast  01/02/2016  CLINICAL DATA:  Status epilepticus. Drug overdose. Fever. Leukocytosis. EXAM: CT HEAD WITHOUT CONTRAST TECHNIQUE: Contiguous axial images were obtained from the base of the skull through the vertex without intravenous contrast. COMPARISON:  None. FINDINGS: No mass lesion. No midline shift. No acute hemorrhage or hematoma. No extra-axial fluid collections. No evidence of acute infarction. Brain parenchyma is normal. There is partial opacification of the paranasal sinuses. This is most likely chronic. Osseous structures are otherwise normal. IMPRESSION: No acute intracranial abnormality.  Chronic sinus disease. Electronically Signed   By: Francene Boyers M.D.   On: 01/02/2016 12:59   Dg Chest Port 1 View  01/04/2016  CLINICAL DATA:  Respiratory failure EXAM: PORTABLE CHEST 1 VIEW COMPARISON:  01/03/2016 FINDINGS: Endotracheal tube in good position. Central venous catheter tip in the right atrium unchanged. NG tube in the stomach with the tip not visualized. Diffuse bilateral airspace disease unchanged.  No effusion. IMPRESSION: Support lines remain in good position No significant change diffuse  bilateral airspace disease. Electronically Signed   By: Marlan Palau M.D.   On: 01/04/2016 09:25   Dg Chest Port 1 View  01/03/2016  CLINICAL DATA:  Acute respiratory failure, drug overdose, acute encephalopathy. EXAM: PORTABLE CHEST 1 VIEW COMPARISON:  Portable chest x-ray of January 02, 2016 FINDINGS: The lungs are adequately inflated. The interstitial markings remain increased. Areas of confluent alveolar opacity persist but are slightly less conspicuous. The cardiac silhouette remains enlarged. The central pulmonary vascularity remains engorged. The endotracheal tube tip lies 3 cm above the carina. The esophagogastric tube tip projects below the inferior  margin of the image. A left-sided PICC line has been placed since yesterday's study with the tip at or just above the cavoatrial junction. IMPRESSION: Slight interval improvement in the appearance of the pulmonary interstitium but considerable interstitial edema or pneumonia process. Mild cardiomegaly with mild central pulmonary vascular congestion. The support tubes are in reasonable position though the PICC line tip does lie at or just above the cavoatrial junction. Electronically Signed   By: David  [place] M.D.   On: 01/03/2016 07:32   Dg Chest Port 1 View  01/02/2016  CLINICAL DATA:  Acute respiratory failure, hypoxemia EXAM: PORTABLE CHEST 1 VIEW COMPARISON:  01/01/2016 and 12/31/2015 FINDINGS: Cardiomediastinal silhouette is stable. Endotracheal tube in place with tip 4 cm above the carina. Stable NG tube position. There is mild interstitial prominence bilaterally with subtle patchy airspace opacities. Findings suspicious for mild interstitial edema. Superimposed patchy alveolar edema or infiltrates cannot be excluded. Clinical correlation is necessary. IMPRESSION: There is mild interstitial prominence bilaterally with subtle patchy airspace opacities. Findings suspicious for mild interstitial edema. Superimposed patchy alveolar edema or infiltrates  cannot be excluded. Clinical correlation is necessary. Electronically Signed   By: Natasha Mead M.D.   On: 01/02/2016 10:48   Portable Chest Xray  01/01/2016  CLINICAL DATA:  Central line placement and gastric tube placement. Respiratory distress. EXAM: PORTABLE CHEST 1 VIEW COMPARISON:  01/01/2016 FINDINGS: Endotracheal tube remains in stable position. NG tube is in the stomach. No visible central line. No pneumothorax. Heart is borderline in size with vascular congestion. Left lower lobe atelectasis or infiltrate and possible small left effusion. IMPRESSION: Endotracheal tube and NG tube remain in place, unchanged. No visible central line. Vascular congestion. Left lower lobe atelectasis or infiltrate with possible small left effusion. Electronically Signed   By: Charlett Nose M.D.   On: 01/01/2016 12:12   Dg Chest Port 1 View  01/01/2016  CLINICAL DATA:  Respiratory failure, intubated patient, drug overdose, current smoker. EXAM: PORTABLE CHEST 1 VIEW COMPARISON:  Portable chest x-ray of December 31, 2015 FINDINGS: The film is obtained in a reverse lordotic projection. The interstitial markings are more conspicuous bilaterally. The cardiac silhouette is mildly enlarged and the central pulmonary vascularity is engorged. The endotracheal tube tip lies 4 cm above the carina. The esophagogastric tube tip projects below the inferior margin of the image. IMPRESSION: Patient positioning today may be influencing the appearance of the pulmonary interstitium and cardiac silhouette. The findings suggest mild CHF. Correlation with the patient's clinical and laboratory values is needed. A repeat well-positioned portable chest x-ray may be useful. Electronically Signed   By: David  [place] M.D.   On: 01/01/2016 07:33   Dg Abd Portable 1v  01/02/2016  CLINICAL DATA:  Ileus, distended abdomen EXAM: PORTABLE ABDOMEN - 1 VIEW COMPARISON:  01/01/2016 FINDINGS: There is normal small bowel gas pattern. NG tube in place with tip in  distal stomach. Moderate gaseous distension of the right colon and transverse colon. IMPRESSION: Normal small bowel gas pattern. Moderate gaseous distension of the right and transverse colon. NG tube in place. Electronically Signed   By: Natasha Mead M.D.   On: 01/02/2016 10:49   Dg Abd Portable 1v  01/01/2016  CLINICAL DATA:  Nasogastric tube placement EXAM: PORTABLE ABDOMEN - 1 VIEW COMPARISON:  Study obtained earlier in the day. FINDINGS: Nasogastric tube tip and side port in stomach. Stomach is no longer distended. Bowel gas pattern is unremarkable. No obstruction or free air. IMPRESSION: Nasogastric tube tip and side  port in stomach. Bowel gas pattern unremarkable. Electronically Signed   By: Bretta Bang III M.D.   On: 01/01/2016 12:12     Assessment and plan:   Stacie Sanders is an 45 y.o. female patient with benzodiazepine overdose, now extubated. She has a significant psychiatric comorbidity in the past medical history. Although she has been complaining of some right-sided weakness to nursing staff, her neurological examination is grossly non focal , with no definite evidence of any motor weakness noted. Suspect musculoskeletal pain contributing to her subjective feeling of weakness, which improved to full strength on encouragement. Consider MRI brain for further evaluation if she continues to report such focal subjective neurological symptoms. Based on poor respiratory status, especially lying supine, will defer MRI for now.  No other new neuro recomendations. Continue PT/OT, supportive care.  Will  f/u.     01/04/2016, 12:51 PM

## 2016-01-04 NOTE — Progress Notes (Signed)
Wasted 100mL Fentanyl in the sink.  Witnessed by Emelia Salisburyarrie Craver, RN.
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
## 2016-01-05 ENCOUNTER — Inpatient Hospital Stay (HOSPITAL_COMMUNITY): Payer: Medicaid Other

## 2016-01-05 DIAGNOSIS — R569 Unspecified convulsions: Secondary | ICD-10-CM

## 2016-01-05 DIAGNOSIS — M79622 Pain in left upper arm: Secondary | ICD-10-CM

## 2016-01-05 DIAGNOSIS — M6282 Rhabdomyolysis: Secondary | ICD-10-CM

## 2016-01-05 DIAGNOSIS — T50904S Poisoning by unspecified drugs, medicaments and biological substances, undetermined, sequela: Secondary | ICD-10-CM

## 2016-01-05 LAB — CBC WITH DIFFERENTIAL/PLATELET
BASOS ABS: 0 10*3/uL (ref 0.0–0.1)
BASOS PCT: 0 %
EOS ABS: 0 10*3/uL (ref 0.0–0.7)
EOS PCT: 0 %
HCT: 38 % (ref 36.0–46.0)
Hemoglobin: 11.8 g/dL — ABNORMAL LOW (ref 12.0–15.0)
Lymphocytes Relative: 11 %
Lymphs Abs: 1.4 10*3/uL (ref 0.7–4.0)
MCH: 30.6 pg (ref 26.0–34.0)
MCHC: 31.1 g/dL (ref 30.0–36.0)
MCV: 98.7 fL (ref 78.0–100.0)
MONO ABS: 0.6 10*3/uL (ref 0.1–1.0)
Monocytes Relative: 4 %
Neutro Abs: 10.8 10*3/uL — ABNORMAL HIGH (ref 1.7–7.7)
Neutrophils Relative %: 85 %
PLATELETS: 231 10*3/uL (ref 150–400)
RBC: 3.85 MIL/uL — ABNORMAL LOW (ref 3.87–5.11)
RDW: 13.5 % (ref 11.5–15.5)
WBC: 12.8 10*3/uL — AB (ref 4.0–10.5)

## 2016-01-05 LAB — RENAL FUNCTION PANEL
ALBUMIN: 2.6 g/dL — AB (ref 3.5–5.0)
Anion gap: 10 (ref 5–15)
BUN: 7 mg/dL (ref 6–20)
CALCIUM: 8.6 mg/dL — AB (ref 8.9–10.3)
CO2: 29 mmol/L (ref 22–32)
CREATININE: 0.78 mg/dL (ref 0.44–1.00)
Chloride: 116 mmol/L — ABNORMAL HIGH (ref 101–111)
GFR calc Af Amer: >60 mL/min (ref >60)
GFR calc non Af Amer: >60 mL/min (ref >60)
GLUCOSE: 143 mg/dL — AB (ref 65–99)
PHOSPHORUS: 2.4 mg/dL — AB (ref 2.5–4.6)
Potassium: 2.9 mmol/L — ABNORMAL LOW (ref 3.5–5.1)
SODIUM: 155 mmol/L — AB (ref 135–145)

## 2016-01-05 LAB — BASIC METABOLIC PANEL
ANION GAP: 11 (ref 5–15)
BUN: 7 mg/dL (ref 6–20)
CALCIUM: 8.6 mg/dL — AB (ref 8.9–10.3)
CO2: 28 mmol/L (ref 22–32)
CREATININE: 0.69 mg/dL (ref 0.44–1.00)
Chloride: 109 mmol/L (ref 101–111)
Glucose, Bld: 164 mg/dL — ABNORMAL HIGH (ref 65–99)
Potassium: 2.8 mmol/L — ABNORMAL LOW (ref 3.5–5.1)
SODIUM: 148 mmol/L — AB (ref 135–145)

## 2016-01-05 LAB — GLUCOSE, CAPILLARY
GLUCOSE-CAPILLARY: 166 mg/dL — AB (ref 65–99)
GLUCOSE-CAPILLARY: 142 mg/dL — AB (ref 65–99)
Glucose-Capillary: 126 mg/dL — ABNORMAL HIGH (ref 65–99)
Glucose-Capillary: 140 mg/dL — ABNORMAL HIGH (ref 65–99)
Glucose-Capillary: 142 mg/dL — ABNORMAL HIGH (ref 65–99)
Glucose-Capillary: 153 mg/dL — ABNORMAL HIGH (ref 65–99)

## 2016-01-05 LAB — PROCALCITONIN: PROCALCITONIN: 0.41 ng/mL

## 2016-01-05 LAB — PHOSPHORUS: PHOSPHORUS: 2.8 mg/dL (ref 2.5–4.6)

## 2016-01-05 LAB — CK
CK TOTAL: 30783 U/L — AB (ref 38–234)
CK TOTAL: 21856 U/L — AB (ref 38–234)

## 2016-01-05 LAB — MAGNESIUM: MAGNESIUM: 1.9 mg/dL (ref 1.7–2.4)

## 2016-01-05 MED ORDER — POTASSIUM CHLORIDE 10 MEQ/50ML IV SOLN
10.0000 meq | INTRAVENOUS | Status: AC
  Administered 2016-01-05 (×4): 10 meq via INTRAVENOUS
  Filled 2016-01-05 (×4): qty 50

## 2016-01-05 MED ORDER — SODIUM CHLORIDE 0.9 % IJ SOLN
10.0000 mL | Freq: Two times a day (BID) | INTRAMUSCULAR | Status: DC
  Administered 2016-01-05 – 2016-01-06 (×2): 10 mL

## 2016-01-05 MED ORDER — MORPHINE SULFATE (PF) 2 MG/ML IV SOLN
2.0000 mg | INTRAVENOUS | Status: DC | PRN
  Administered 2016-01-05 – 2016-01-06 (×4): 4 mg via INTRAVENOUS
  Filled 2016-01-05 (×4): qty 2

## 2016-01-05 MED ORDER — SODIUM CHLORIDE 0.9 % IJ SOLN
10.0000 mL | INTRAMUSCULAR | Status: DC | PRN
  Administered 2016-01-08 – 2016-01-13 (×9): 10 mL
  Filled 2016-01-05 (×9): qty 40

## 2016-01-05 MED ORDER — POTASSIUM PHOSPHATES 15 MMOLE/5ML IV SOLN
40.0000 meq | Freq: Once | INTRAVENOUS | Status: AC
  Administered 2016-01-05: 40 meq via INTRAVENOUS
  Filled 2016-01-05: qty 9.09

## 2016-01-05 MED ORDER — ACETAMINOPHEN 325 MG PO TABS
650.0000 mg | ORAL_TABLET | Freq: Four times a day (QID) | ORAL | Status: DC | PRN
  Administered 2016-01-05: 650 mg via ORAL
  Filled 2016-01-05: qty 2

## 2016-01-05 MED ORDER — PANTOPRAZOLE SODIUM 40 MG PO TBEC
40.0000 mg | DELAYED_RELEASE_TABLET | Freq: Two times a day (BID) | ORAL | Status: DC
  Administered 2016-01-05 – 2016-01-13 (×16): 40 mg via ORAL
  Filled 2016-01-05 (×18): qty 1

## 2016-01-05 NOTE — Progress Notes (Signed)
eLink Physician-Brief Progress Note Patient Name: Stacie Sanders DOB: 10-26-1971 MRN: [id number]   Date of Service  01/05/2016  HPI/Events of Note  K low, Phosph OK  eICU Interventions  KCL IV SUPP     Intervention Category Major Interventions: Electrolyte abnormality - evaluation and management  Shan Levansatrick Wright 01/05/2016, 6:40 PM

## 2016-01-05 NOTE — Progress Notes (Signed)
Interval history  :                                                                                                                                       Stacie Sanders is an 45 y.o. female patient with benzodiazepine overdose, now extubated. She has a significant psychiatric comorbidity in the past medical history. Yesterday she complained of right-sided weakness to nursing staff, , but no definite weakness was seen during my evaluation. This morning, she is been complaining of her sitting pain and weakness in the left upper extremityand is involving her face or the left lower extremity on the right side.     Past Medical History  Diagnosis Date  . Depression   . Kidney stone     Past Surgical History  Procedure Laterality Date  . Tubal ligation    . Lithotripsy      History reviewed. No pertinent family history. Social History:  reports that she has been smoking Cigarettes.  She does not have any smokeless tobacco history on file. She reports that she drinks alcohol. She reports that she does not use illicit drugs.  Allergies:  Allergies  Allergen Reactions  . Fentanyl Itching  . Percocet [Oxycodone-Acetaminophen] Hives and Itching    Medications:                                                                                                                          Current facility-administered medications:  .  0.9 %  sodium chloride infusion, 250 mL, Intravenous, PRN, Orville Govern, MD, Stopped at 01/03/16 2026 .  acetaminophen (TYLENOL) suppository 650 mg, 650 mg, Rectal, Q6H PRN, Lupita Leash, MD, 650 mg at 01/04/16 0849 .  acetaminophen (TYLENOL) tablet 650 mg, 650 mg, Oral, Q6H PRN, Kalman Shan, MD .  bisacodyl (DULCOLAX) EC tablet 5 mg, 5 mg, Oral, Daily PRN, Lupita Leash, MD, 5 mg at 01/05/16 0945 .  clonazePAM (KLONOPIN) tablet 0.5 mg, 0.5 mg, Oral, BID, Lupita Leash, MD, 0.5 mg at 01/05/16 0946 .  clonazePAM (KLONOPIN) tablet 0.5 mg,  0.5 mg, Oral, BID PRN, Kalman Shan, MD, 0.5 mg at 01/04/16 1835 .  dextrose 5 % solution, , Intravenous, Continuous, William S Minor, NP, Last Rate: 150 mL/hr at 01/05/16 1526 .  docusate sodium (COLACE) capsule 100 mg, 100 mg, Oral, BID PRN, Roslynn Amble,  MD, 100 mg at 01/05/16 0945 .  enoxaparin (LOVENOX) injection 40 mg, 40 mg, Subcutaneous, Q24H, Orville Govern, MD, 40 mg at 01/05/16 0947 .  famotidine (PEPCID) IVPB 20 mg premix, 20 mg, Intravenous, Q12H, Lupita Leash, MD, 20 mg at 01/05/16 0948 .  ibuprofen (ADVIL,MOTRIN) 100 MG/5ML suspension 400 mg, 400 mg, Per Tube, Q6H PRN, Roslynn Amble, MD, 400 mg at 01/01/16 2233 .  insulin aspart (novoLOG) injection 0-9 Units, 0-9 Units, Subcutaneous, 6 times per day, Roslynn Amble, MD, 1 Units at 01/05/16 1833 .  ipratropium-albuterol (DUONEB) 0.5-2.5 (3) MG/3ML nebulizer solution 3 mL, 3 mL, Nebulization, Q4H PRN, Nelda Bucks, MD .  levETIRAcetam (KEPPRA) 1,000 mg in sodium chloride 0.9 % 100 mL IVPB, 1,000 mg, Intravenous, Q12H, Ramond Marrow, DO, 1,000 mg at 01/05/16 1133 .  levofloxacin (LEVAQUIN) IVPB 500 mg, 500 mg, Intravenous, Q24H, Kalman Shan, MD, 500 mg at 01/05/16 1221 .  LORazepam (ATIVAN) injection 0.5 mg, 0.5 mg, Intravenous, Once PRN, Lourdes Manning Daniel Nones, MD .  ondansetron [facility]) injection 4 mg, 4 mg, Intravenous, Q6H PRN, Lupita Leash, MD, 4 mg at 01/05/16 1520 .  pantoprazole (PROTONIX) EC tablet 40 mg, 40 mg, Oral, BID AC, William S Minor, NP, 40 mg at 01/05/16 1831 .  polyethylene glycol (MIRALAX / GLYCOLAX) packet 17 g, 17 g, Oral, Daily, Lupita Leash, MD, 17 g at 01/05/16 0951 .  potassium chloride 10 mEq in 50 mL *CENTRAL LINE* IVPB, 10 mEq, Intravenous, Q1 Hr x 4, Storm Frisk, MD .  sennosides (SENOKOT) 8.8 MG/5ML syrup 5 mL, 5 mL, Per Tube, Daily PRN, Lupita Leash, MD .  sodium chloride 0.9 % injection 10-40 mL, 10-40 mL, Intracatheter, Q12H, Roslynn Amble, MD, 20 mL at 01/05/16 1023 .  sodium chloride 0.9 % injection 10-40 mL, 10-40 mL, Intracatheter, PRN, Roslynn Amble, MD .  sodium chloride 0.9 % injection 10-40 mL, 10-40 mL, Intracatheter, Q12H, Kalman Shan, MD .  sodium chloride 0.9 % injection 10-40 mL, 10-40 mL, Intracatheter, PRN, Kalman Shan, MD    Neurologic Examination:                                                                                                      Blood pressure 140/71, pulse 85, temperature 98.6 F (37 C), temperature source Oral, resp. rate 16, height 5\' 8"  (1.727 m), weight 107.5 kg (236 lb 15.9 oz), SpO2 99 %.  Evaluation of higher integrative functions including: Level of alertness: Alert,  Oriented to time, place and person Attention span and concentration  - intact   Speech: fluent, no evidence of dysarthria or aphasia noted.  Test the following cranial nerves: 2-12 grossly intact Motor examination: Normal tone, bulk, full 5/5 motor strength in in right upper extremity, and bilateral lower extremities . Not cooperative with examining her left upper extremity, report severe pain even passively moving her left arm. No swelling or erythema noted in the left upper extremity.  Examination of sensation : Normal and symmetric sensation to pinprick in all 4  extremities and on face Examination of deep tendon reflexes: 2+, normal and symmetric in all extremities, normal plantars bilaterally Test coordination: Normal finger nose testing, with no evidence of limb appendicular ataxia or abnormal involuntary movements or tremors noted.  Gait: Deferred   Lab Results: Basic Metabolic Panel:  Recent Labs Lab 12/30/15 0353  01/03/16 0540 01/04/16 0510 01/04/16 0511 01/05/16 0536 01/05/16 1738  NA 142  < > 151*  150* 155* 155* 155* 148*  K 3.4*  < > 3.0*  3.0* 3.4* 3.5 2.9* 2.8*  CL 106  < > 120*  118* 122* 122* 116* 109  CO2 24  < > 22  22 26 27 29 28   GLUCOSE 135*  < > 152*  151*  155* 156* 143* 164*  BUN 8  < > 19  19 16 16 7 7   CREATININE 0.71  < > 1.14*  1.15* 0.94 1.00 0.78 0.69  CALCIUM 8.6*  < > 8.0*  8.0* 8.5* 8.5* 8.6* 8.6*  MG 1.7  --   --  1.9  --  1.9  --   PHOS 2.2*  < > 3.0 1.4* 1.3* 2.4* 2.8  < > = values in this interval not displayed.  Liver Function Tests:  Recent Labs Lab 01/01/16 0115 01/02/16 0400 01/03/16 0540 01/04/16 0511 01/05/16 0536  ALBUMIN 3.3* 3.4* 2.6* 2.2* 2.6*   No results for input(s): LIPASE, AMYLASE in the last 168 hours.  Recent Labs Lab 01/02/16 1047  AMMONIA 42*    CBC:  Recent Labs Lab 01/01/16 0115 01/02/16 0400 01/03/16 0540 01/04/16 0510 01/05/16 0536  WBC 13.0* 13.3* 15.3* 11.1* 12.8*  NEUTROABS 9.3* 11.0* 11.9* 8.4* 10.8*  HGB 13.2 12.9 11.7* 10.9* 11.8*  HCT 42.1 42.2 37.1 35.0* 38.0  MCV 101.9* 102.9* 102.2* 100.3* 98.7  PLT 269 282 195 192 231    Cardiac Enzymes:  Recent Labs Lab 01/03/16 1030 01/03/16 1736 01/03/16 2306 01/04/16 0510 01/05/16 0536 01/05/16 1204  CKTOTAL 5168* 6624* 4773* 3594* 30783* 21856*  CKMB 31.4*  --   --   --   --   --     Lipid Panel:  Recent Labs Lab 12/30/15 0353 12/31/15 0331 01/01/16 0115  TRIG 333* 333*  334* 409*    CBG:  Recent Labs Lab 01/04/16 2347 01/05/16 0326 01/05/16 0747 01/05/16 1211 01/05/16 1602  GLUCAP 140* 126* 142* 166* 142*    Microbiology: Results for orders placed or performed during the hospital encounter of 12/29/15  MRSA PCR Screening     Status: None   Collection Time: 12/29/15 10:23 AM  Result Value Ref Range Status   MRSA by PCR NEGATIVE NEGATIVE Final    Comment:        The GeneXpert MRSA Assay (FDA approved for NASAL specimens only), is one component of a comprehensive MRSA colonization surveillance program. It is not intended to diagnose MRSA infection nor to guide or monitor treatment for MRSA infections.   Culture, Urine     Status: None   Collection Time: 12/29/15 11:26 AM  Result  Value Ref Range Status   Specimen Description URINE, CATHETERIZED  Final   Special Requests NONE  Final   Culture   Final    NO GROWTH 1 DAY Performed at [facility]     Report Status 12/30/2015 FINAL  Final  Culture, respiratory (NON-Expectorated)     Status: None   Collection Time: 12/29/15 11:27 AM  Result Value Ref Range Status   Specimen Description TRACHEAL ASPIRATE  Final   Special Requests NONE  Final   Gram Stain   Final    NO WBC SEEN NO SQUAMOUS EPITHELIAL CELLS SEEN MODERATE GRAM POSITIVE COCCI IN PAIRS Performed at [facility]    Culture   Final    NORMAL OROPHARYNGEAL FLORA Performed at [facility]    Report Status 01/01/2016 FINAL  Final  Culture, blood (routine x 2)     Status: None   Collection Time: 12/29/15 12:01 PM  Result Value Ref Range Status   Specimen Description BLOOD RIGHT HAND  Final   Special Requests BOTTLES DRAWN AEROBIC ONLY 5CC  Final   Culture   Final    NO GROWTH 5 DAYS Performed at [facility]    Report Status 01/03/2016 FINAL  Final  Culture, blood (routine x 2)     Status: None   Collection Time: 12/29/15 12:13 PM  Result Value Ref Range Status   Specimen Description BLOOD RIGHT HAND  Final   Special Requests IN PEDIATRIC BOTTLE 1CC  Final   Culture   Final    NO GROWTH 5 DAYS Performed at [facility]    Report Status 01/03/2016 FINAL  Final  Culture, blood (routine x 2)     Status: None (Preliminary result)   Collection Time: 12/31/15  1:15 AM  Result Value Ref Range Status   Specimen Description BLOOD RIGHT ANTECUBITAL  Final   Special Requests IN PEDIATRIC BOTTLE  Final   Culture   Final    NO GROWTH 4 DAYS Performed at [facility]    Report Status PENDING  Incomplete  Culture, blood (routine x 2)     Status: None (Preliminary result)   Collection Time: 12/31/15  1:20 AM  Result Value Ref Range Status   Specimen Description BLOOD R HAND  Final   Special Requests IN  PEDIATRIC BOTTLE 1 ML  Final   Culture   Final    NO GROWTH 4 DAYS Performed at [facility]    Report Status PENDING  Incomplete  Culture, respiratory (NON-Expectorated)     Status: None   Collection Time: 01/01/16 12:50 AM  Result Value Ref Range Status   Specimen Description TRACHEAL ASPIRATE  Final   Special Requests Normal  Final   Gram Stain   Final    FEW WBC PRESENT, PREDOMINANTLY PMN FEW SQUAMOUS EPITHELIAL CELLS PRESENT NO ORGANISMS SEEN Performed at [facility]    Culture   Final    NORMAL OROPHARYNGEAL FLORA Performed at [facility]    Report Status 01/03/2016 FINAL  Final  Culture, Urine     Status: None   Collection Time: 01/01/16  1:35 AM  Result Value Ref Range Status   Specimen Description URINE, CATHETERIZED  Final   Special Requests Normal  Final   Culture   Final    NO GROWTH 1 DAY Performed at [facility]    Report Status 01/02/2016 FINAL  Final  Culture, blood (Routine X 2) w Reflex to ID Panel     Status: None (Preliminary result)   Collection Time: 01/02/16  9:30 AM  Result Value Ref Range Status   Specimen Description BLOOD RIGHT ARM  Final   Special Requests IN PEDIATRIC BOTTLE 0.5CC  Final   Culture   Final    NO GROWTH 3 DAYS Performed at [facility]    Report Status PENDING  Incomplete  Culture, blood (Routine X 2) w Reflex to  ID Panel     Status: None (Preliminary result)   Collection Time: 01/02/16  9:35 AM  Result Value Ref Range Status   Specimen Description BLOOD LEFT ARM  Final   Special Requests IN PEDIATRIC BOTTLE 1CC  Final   Culture   Final    NO GROWTH 3 DAYS Performed at [facility]    Report Status PENDING  Incomplete  Culture, respiratory (NON-Expectorated)     Status: None   Collection Time: 01/02/16  9:40 AM  Result Value Ref Range Status   Specimen Description TRACHEAL ASPIRATE  Final   Special Requests Normal  Final   Gram Stain   Final    ABUNDANT WBC PRESENT,  PREDOMINANTLY PMN RARE SQUAMOUS EPITHELIAL CELLS PRESENT NO ORGANISMS SEEN Performed at [facility]    Culture   Final    MODERATE YEAST Performed at [facility]    Report Status 01/04/2016 FINAL  Final     Imaging: Ct Head Wo Contrast  01/02/2016  CLINICAL DATA:  Status epilepticus. Drug overdose. Fever. Leukocytosis. EXAM: CT HEAD WITHOUT CONTRAST TECHNIQUE: Contiguous axial images were obtained from the base of the skull through the vertex without intravenous contrast. COMPARISON:  None. FINDINGS: No mass lesion. No midline shift. No acute hemorrhage or hematoma. No extra-axial fluid collections. No evidence of acute infarction. Brain parenchyma is normal. There is partial opacification of the paranasal sinuses. This is most likely chronic. Osseous structures are otherwise normal. IMPRESSION: No acute intracranial abnormality.  Chronic sinus disease. Electronically Signed   By: Francene Boyers M.D.   On: 01/02/2016 12:59   Dg Chest Port 1 View  01/05/2016  CLINICAL DATA:  Patient with respiratory failure. EXAM: PORTABLE CHEST 1 VIEW COMPARISON:  Chest radiograph 01/04/2016. FINDINGS: Left upper extremity PICC line tip projects over the right atrium, unchanged. Interval extubation. Stable cardiac and mediastinal contours. Significant interval improvement and near complete resolution of previously described bilateral diffuse airspace opacities. No pleural effusion or pneumothorax. IMPRESSION: Marked interval improvement in previously described airspace opacities. Interval extubation. Electronically Signed   By: Annia Belt M.D.   On: 01/05/2016 07:43   Dg Chest Port 1 View  01/04/2016  CLINICAL DATA:  Respiratory failure EXAM: PORTABLE CHEST 1 VIEW COMPARISON:  01/03/2016 FINDINGS: Endotracheal tube in good position. Central venous catheter tip in the right atrium unchanged. NG tube in the stomach with the tip not visualized. Diffuse bilateral airspace disease unchanged.  No  effusion. IMPRESSION: Support lines remain in good position No significant change diffuse bilateral airspace disease. Electronically Signed   By: Marlan Palau M.D.   On: 01/04/2016 09:25   Dg Chest Port 1 View  01/03/2016  CLINICAL DATA:  Acute respiratory failure, drug overdose, acute encephalopathy. EXAM: PORTABLE CHEST 1 VIEW COMPARISON:  Portable chest x-ray of January 02, 2016 FINDINGS: The lungs are adequately inflated. The interstitial markings remain increased. Areas of confluent alveolar opacity persist but are slightly less conspicuous. The cardiac silhouette remains enlarged. The central pulmonary vascularity remains engorged. The endotracheal tube tip lies 3 cm above the carina. The esophagogastric tube tip projects below the inferior margin of the image. A left-sided PICC line has been placed since yesterday's study with the tip at or just above the cavoatrial junction. IMPRESSION: Slight interval improvement in the appearance of the pulmonary interstitium but considerable interstitial edema or pneumonia process. Mild cardiomegaly with mild central pulmonary vascular congestion. The support tubes are in reasonable position though the PICC line tip does  lie at or just above the cavoatrial junction. Electronically Signed   By: David  [place] M.D.   On: 01/03/2016 07:32   Dg Chest Port 1 View  01/02/2016  CLINICAL DATA:  Acute respiratory failure, hypoxemia EXAM: PORTABLE CHEST 1 VIEW COMPARISON:  01/01/2016 and 12/31/2015 FINDINGS: Cardiomediastinal silhouette is stable. Endotracheal tube in place with tip 4 cm above the carina. Stable NG tube position. There is mild interstitial prominence bilaterally with subtle patchy airspace opacities. Findings suspicious for mild interstitial edema. Superimposed patchy alveolar edema or infiltrates cannot be excluded. Clinical correlation is necessary. IMPRESSION: There is mild interstitial prominence bilaterally with subtle patchy airspace opacities. Findings  suspicious for mild interstitial edema. Superimposed patchy alveolar edema or infiltrates cannot be excluded. Clinical correlation is necessary. Electronically Signed   By: Natasha Mead M.D.   On: 01/02/2016 10:48   Dg Abd Portable 1v  01/02/2016  CLINICAL DATA:  Ileus, distended abdomen EXAM: PORTABLE ABDOMEN - 1 VIEW COMPARISON:  01/01/2016 FINDINGS: There is normal small bowel gas pattern. NG tube in place with tip in distal stomach. Moderate gaseous distension of the right colon and transverse colon. IMPRESSION: Normal small bowel gas pattern. Moderate gaseous distension of the right and transverse colon. NG tube in place. Electronically Signed   By: Natasha Mead M.D.   On: 01/02/2016 10:49     Assessment and plan:   LAYTOYA ION is an 45 y.o. female patient with significant psychiatric comorbidity, admitted with benzodiazepine overdose, now extubated. She's been having some left upper extremity severe pain symptoms and associated subjective weakness, not cooperative with examining her left upper extremity due to pain. Could be related to a localized phlebitis in the left arm as she had an IV line placed proximally in that arm vs musculoskeletal pain symptoms. Given her multitude of neurological symptoms, with unreliable history, poor cooperation with examination,, if she continues to have persistent left upper extremity weakness, then consider MRI of the brain without contrast to rule out intracranial pathology when patient is stable for the study.  No other neurological issues to address at this time, will follow-up PRN. Please call for any further questions.

## 2016-01-05 NOTE — Progress Notes (Signed)
PULMONARY / CRITICAL CARE MEDICINE   Name: Stacie Sanders MRN: [id number] DOB: 1971-01-16    ADMISSION DATE:  12/29/2015 CONSULTATION DATE:  12/29/15  REFERRING MD:  Nicanor AlconPalumbo  CHIEF COMPLAINT:  Drug overdose  MICROBIOLOGY   1/4 Blood Cx >  1/4 Resp Cx > yeast 1/4 Urine Cx > neg 1/4 Influenza - negative      ANTIBIOTICS: Vancomycin 12/31>> 1/2 Zosyn 12/31>> 1/2 , Unasyn 1/2 >1/5 (? Drug fever)  1/5 levaquin>>  LINES/TUBES: ETT 12/29/15 > 1/3>>1/6 Foley 12/29/15 >   SIGNIFICANT EVENTS: 12/31 - Admit & Intubated by EDP 1/3 extubated, re-intubated for stridor 1/4 Fever to 106.7, persistent agitation 1/6 extubation 1/6 d5w for Na 155 1/7 CK 30K  SUBJECTIVE:  01/03/16:Failed SBT earlier - was on high dose range of precedex . SEt off apnea alarms . Gets very agitated when not on sedation. Fever rose to 107F  dramatically improved - after Rx wuth  arctic sun pads 37.2. Marland Kitchen. WBC improved. Seizure free on EEG per neuro  VITAL SIGNS: BP 141/71 mmHg  Pulse 84  Temp(Src) 98.8 F (37.1 C) (Oral)  Resp 17  Ht 5\' 8"  (1.727 m)  Wt 236 lb 15.9 oz (107.5 kg)  BMI 36.04 kg/m2  SpO2 98%  LMP   HEMODYNAMICS:    VENTILATOR SETTINGS:    INTAKE / OUTPUT: I/O last 3 completed shifts: In: 6745.8 [I.V.:5140.8; NG/GT:560; IV Piggyback:1045] Out: 5155 [Urine:5155]  PHYSICAL EXAMINATION: General: Awake and alert on , reports unable to sleep PULM: +rhonchi CV: RRR, no mgr GI: BS+, soft, nontender MSK: normal bulk and tone Derm: normal skin tone, dry, warm Neuro: RASS 0 alert LABS: PULMONARY  Recent Labs Lab 01/01/16 1245 01/02/16 0932  PHART 7.372 7.333*  PCO2ART 39.1 45.4*  PO2ART 190* 82.0  HCO3 22.2 22.1  TCO2 20.0 20.0  O2SAT 99.1 91.2    CBC  Recent Labs Lab 01/03/16 0540 01/04/16 0510 01/05/16 0536  HGB 11.7* 10.9* 11.8*  HCT 37.1 35.0* 38.0  WBC 15.3* 11.1* 12.8*  PLT 195 192 231    COAGULATION No results for input(s): INR in the last 168  hours.  CARDIAC  No results for input(s): TROPONINI in the last 168 hours. No results for input(s): PROBNP in the last 168 hours.   CHEMISTRY  Recent Labs Lab 12/30/15 0353  01/02/16 0400 01/03/16 0540 01/04/16 0510 01/04/16 0511 01/05/16 0536  NA 142  < > 149*  149* 151*  150* 155* 155* 155*  K 3.4*  < > 4.2  4.2 3.0*  3.0* 3.4* 3.5 2.9*  CL 106  < > 115*  116* 120*  118* 122* 122* 116*  CO2 24  < > 25  24 22  22 26 27 29   GLUCOSE 135*  < > 159*  157* 152*  151* 155* 156* 143*  BUN 8  < > 19  18 19  19 16 16 7   CREATININE 0.71  < > 1.04*  1.05* 1.14*  1.15* 0.94 1.00 0.78  CALCIUM 8.6*  < > 8.7*  8.7* 8.0*  8.0* 8.5* 8.5* 8.6*  MG 1.7  --   --   --  1.9  --  1.9  PHOS 2.2*  < > 1.6* 3.0 1.4* 1.3* 2.4*  < > = values in this interval not displayed. Estimated Creatinine Clearance: 117.6 mL/min (by C-G formula based on Cr of 0.78).   LIVER  Recent Labs Lab 01/01/16 0115 01/02/16 0400 01/03/16 0540 01/04/16 0511 01/05/16 0536  ALBUMIN 3.3*  3.4* 2.6* 2.2* 2.6*     INFECTIOUS  Recent Labs Lab 01/03/16 1030  01/04/16 0510 01/04/16 0513 01/04/16 1015 01/04/16 1205 01/05/16 0536  LATICACIDVEN 0.9  < >  --  1.0 1.1 1.1  --   PROCALCITON 0.98  --  0.71  --   --   --  0.41  < > = values in this interval not displayed.   ENDOCRINE CBG (last 3)   Recent Labs  01/04/16 2347 01/05/16 0326 01/05/16 0747  GLUCAP 140* 126* 142*         IMAGING x48h  - image(s) personally visualized  -   highlighted in bold Dg Chest Port 1 View  01/05/2016  CLINICAL DATA:  Patient with respiratory failure. EXAM: PORTABLE CHEST 1 VIEW COMPARISON:  Chest radiograph 01/04/2016. FINDINGS: Left upper extremity PICC line tip projects over the right atrium, unchanged. Interval extubation. Stable cardiac and mediastinal contours. Significant interval improvement and near complete resolution of previously described bilateral diffuse airspace opacities. No pleural  effusion or pneumothorax. IMPRESSION: Marked interval improvement in previously described airspace opacities. Interval extubation. Electronically Signed   By: Annia Belt M.D.   On: 01/05/2016 07:43   Dg Chest Port 1 View  01/04/2016  CLINICAL DATA:  Respiratory failure EXAM: PORTABLE CHEST 1 VIEW COMPARISON:  01/03/2016 FINDINGS: Endotracheal tube in good position. Central venous catheter tip in the right atrium unchanged. NG tube in the stomach with the tip not visualized. Diffuse bilateral airspace disease unchanged.  No effusion. IMPRESSION: Support lines remain in good position No significant change diffuse bilateral airspace disease. Electronically Signed   By: Marlan Palau M.D.   On: 01/04/2016 09:25        ASSESSMENT / PLAN:  NEUROLOGIC A:   Persistent agitation, now with high fever: etiology uncertain: DT's? Benzo withdrawal? Serotonin, NMS, Malignant hyperthremia don't fit with labs, exam or medications. Fever down CK's up 1/7 Benzo overdose initially  Chronic benzodiazepine abuse Suicide Attempt    P:   Precedex gtt -off Continue clonazepam bid Psychiatry consult once patient extubated & suicide precautions  PULMONARY A: Acute Hypoxic Respiratory Failure > resolved Aspiration Pneumonitis > better Post extubation stridor > emergent re-intubation 1/6 post decadron dosessing     P:   Extubated 1/6  CARDIOVASCULAR A:  Nil acute P:  Monitor patient on telemetry Vitals per unit protocol  RENAL Lab Results  Component Value Date   CREATININE 0.78 01/05/2016   CREATININE 1.00 01/04/2016   CREATININE 0.94 01/04/2016     Recent Labs Lab 01/04/16 0510 01/04/16 0511 01/05/16 0536  K 3.4* 3.5 2.9*    Recent Labs Lab 01/04/16 0510 01/04/16 0511 01/05/16 0536  NA 155* 155* 155*     A:   Hypokalemia  Mild rhabdo CK 1100s on 11/01/16 - peaked at 6624 on 1/5 on 1/6  3594 goes along with drug syndromes 1/7 CK 30,783 !! P:   KCl repletion per  elink Monitor BMET and UOP Replace electrolytes as needed(kphos 1/7) IVF d5 w at 150 hr for na 155 on 1/7, advance diet 1/7 recheck CK ?  Source for rhabdo  GASTROINTESTINAL A:   Nausea/vomiting - none on 01/04/16 P:   1/7 start bid PPI zofran prn   HEMATOLOGIC A:   No acute issues  P:  Monitor for bleeding Lovenox [place] daily  SCDs  INFECTIOUS A: HCAP/Aspiration pneumonia  At admit 12/29/2015 High grade fever 107F - on 01/02/16   -escalation after start unasyn 12/31/15  - normal  PCT and lactate and negative cultures so far as of 01/03/16  - CK elevated  - Ddx is sepsis v drug fever v drug syndromes v some combo  01/03/16 - fever controlled but on arctic sun pads @ 37.85F 1/6 fever 103 off arctic sun  1/7 afebrile  P:  monitor fever off arctic sun pads await Re culture 01/02/16 DC unasyn - possible drug fever Start levaquin (check QTc on 12 lead) - and start if Qtc < (it is 0.35 on monitor) 1/6 procal->0.71 Tylenol   ENDOCRINE CBG (last 3)   Recent Labs  01/04/16 2347 01/05/16 0326 01/05/16 0747  GLUCAP 140* 126* 142*     A:   Hyperglycemia - No h/o DM    P:   Accu-Checks q4hr Low dose SSI per algorithm    PSYCHIATRIC A: Suicide Attempt with Overdose H/O Depression/Anxiety  P: Suicide precautions and psych consult once extubated and less confused   FAMILY  - Updates: Husband updated 1/6 per SM. - Inter-disciplinary family meet or Palliative Care meeting due by:  01/04/16   Summary: 1/6 extubated. Neuro intact. Hypernatremia and fever addressed. 1/7 Insomnia and GERD noted. Will leave in ICU one more day.    Brett Canales Minor ACNP [facility] Pager [phone] till 3 pm If no answer page [phone] 01/05/2016, 9:05 AM

## 2016-01-06 ENCOUNTER — Inpatient Hospital Stay (HOSPITAL_COMMUNITY): Payer: Medicaid Other

## 2016-01-06 LAB — CBC WITH DIFFERENTIAL/PLATELET
BASOS ABS: 0 10*3/uL (ref 0.0–0.1)
Basophils Relative: 0 %
EOS PCT: 3 %
Eosinophils Absolute: 0.4 10*3/uL (ref 0.0–0.7)
HCT: 38.7 % (ref 36.0–46.0)
HEMOGLOBIN: 12.5 g/dL (ref 12.0–15.0)
LYMPHS PCT: 16 %
Lymphs Abs: 1.9 10*3/uL (ref 0.7–4.0)
MCH: 31.6 pg (ref 26.0–34.0)
MCHC: 32.3 g/dL (ref 30.0–36.0)
MCV: 97.7 fL (ref 78.0–100.0)
Monocytes Absolute: 0.6 10*3/uL (ref 0.1–1.0)
Monocytes Relative: 5 %
NEUTROS PCT: 76 %
Neutro Abs: 9 10*3/uL — ABNORMAL HIGH (ref 1.7–7.7)
PLATELETS: 280 10*3/uL (ref 150–400)
RBC: 3.96 MIL/uL (ref 3.87–5.11)
RDW: 13.2 % (ref 11.5–15.5)
WBC: 11.8 10*3/uL — AB (ref 4.0–10.5)

## 2016-01-06 LAB — GLUCOSE, CAPILLARY
GLUCOSE-CAPILLARY: 164 mg/dL — AB (ref 65–99)
GLUCOSE-CAPILLARY: 146 mg/dL — AB (ref 65–99)
GLUCOSE-CAPILLARY: 126 mg/dL — AB (ref 65–99)
GLUCOSE-CAPILLARY: 120 mg/dL — AB (ref 65–99)
Glucose-Capillary: 118 mg/dL — ABNORMAL HIGH (ref 65–99)
Glucose-Capillary: 132 mg/dL — ABNORMAL HIGH (ref 65–99)
Glucose-Capillary: 141 mg/dL — ABNORMAL HIGH (ref 65–99)

## 2016-01-06 LAB — CULTURE, BLOOD (ROUTINE X 2)
Culture: NO GROWTH
Culture: NO GROWTH

## 2016-01-06 LAB — RENAL FUNCTION PANEL
Albumin: 2.4 g/dL — ABNORMAL LOW (ref 3.5–5.0)
Anion gap: 10 (ref 5–15)
BUN: <5 mg/dL — ABNORMAL LOW (ref 6–20)
CALCIUM: 8.6 mg/dL — AB (ref 8.9–10.3)
CHLORIDE: 111 mmol/L (ref 101–111)
CO2: 28 mmol/L (ref 22–32)
CREATININE: 0.72 mg/dL (ref 0.44–1.00)
Glucose, Bld: 163 mg/dL — ABNORMAL HIGH (ref 65–99)
PHOSPHORUS: 2.2 mg/dL — AB (ref 2.5–4.6)
Potassium: 2.9 mmol/L — ABNORMAL LOW (ref 3.5–5.1)
SODIUM: 149 mmol/L — AB (ref 135–145)

## 2016-01-06 LAB — BASIC METABOLIC PANEL
ANION GAP: 12 (ref 5–15)
CHLORIDE: 108 mmol/L (ref 101–111)
CO2: 28 mmol/L (ref 22–32)
Calcium: 8.7 mg/dL — ABNORMAL LOW (ref 8.9–10.3)
Creatinine, Ser: 0.66 mg/dL (ref 0.44–1.00)
GFR calc Af Amer: >60 mL/min (ref >60)
Glucose, Bld: 150 mg/dL — ABNORMAL HIGH (ref 65–99)
POTASSIUM: 3.2 mmol/L — AB (ref 3.5–5.1)
SODIUM: 148 mmol/L — AB (ref 135–145)

## 2016-01-06 LAB — CK: CK TOTAL: 33221 U/L — AB (ref 38–234)

## 2016-01-06 LAB — MAGNESIUM: Magnesium: 2.1 mg/dL (ref 1.7–2.4)

## 2016-01-06 MED ORDER — POTASSIUM CHLORIDE 10 MEQ/50ML IV SOLN
10.0000 meq | INTRAVENOUS | Status: AC
  Administered 2016-01-06 (×5): 10 meq via INTRAVENOUS
  Filled 2016-01-06 (×5): qty 50

## 2016-01-06 MED ORDER — LORAZEPAM 2 MG/ML IJ SOLN
0.5000 mg | Freq: Once | INTRAMUSCULAR | Status: AC | PRN
  Administered 2016-01-06: 1 mg via INTRAVENOUS

## 2016-01-06 MED ORDER — DIPHENHYDRAMINE HCL 50 MG/ML IJ SOLN: 12.5000 mg | Freq: Three times a day (TID) | INTRAMUSCULAR | Status: DC | PRN

## 2016-01-06 MED ORDER — OXYCODONE-ACETAMINOPHEN 5-325 MG PO TABS
1.0000 | ORAL_TABLET | Freq: Four times a day (QID) | ORAL | Status: DC | PRN
  Administered 2016-01-06 – 2016-01-08 (×5): 2 via ORAL
  Administered 2016-01-08 – 2016-01-09 (×2): 1 via ORAL
  Administered 2016-01-09 – 2016-01-12 (×7): 2 via ORAL
  Filled 2016-01-06 (×4): qty 2
  Filled 2016-01-06: qty 1
  Filled 2016-01-06 (×2): qty 2
  Filled 2016-01-06: qty 1
  Filled 2016-01-06 (×6): qty 2

## 2016-01-06 NOTE — Progress Notes (Signed)
[facility] FOR [facility] REPLACEMENT ONLY  The patient does [facility] based on the criteria listed below:   1. Is GFR >/= 40 ml/min? Yes.    Patient's GFR today is >60 2. Is urine output >/= 0.5 ml/kg/hr for the last 6 hours? Yes.   Patient's UOP is 2.3 ml/kg/hr 3. Is BUN < 60 mg/dL? Yes.    Patient's BUN today is <5 4. Abnormal electrolyte(s): K 2.9 5. Ordered repletion with: per protocol 6. If a panic level lab has been reported, has the CCM MD in charge been notified? No..   Physician:    Markus DaftWHELAN, Kengo Sturges A 01/06/2016 5:59 AM

## 2016-01-06 NOTE — Progress Notes (Signed)
PULMONARY / CRITICAL CARE MEDICINE   Name: Stacie Sanders MRN: [id number] DOB: 08-Nov-1971    ADMISSION DATE:  12/29/2015 CONSULTATION DATE:  12/29/15  REFERRING MD:  Nicanor Alcon  CHIEF COMPLAINT:  Drug overdose  MICROBIOLOGY   1/4 Blood Cx >  1/4 Resp Cx > yeast 1/4 Urine Cx > neg 1/4 Influenza - negative      ANTIBIOTICS: Vancomycin 12/31>> 1/2 Zosyn 12/31>> 1/2 , Unasyn 1/2 >1/5 (? Drug fever)  1/5 levaquin>>  LINES/TUBES: ETT 12/29/15 > 1/3>>1/6 Foley 12/29/15 >   SIGNIFICANT EVENTS: 12/31 - Admit & Intubated by EDP 1/3 extubated, re-intubated for stridor 1/4 Fever to 106.7, persistent agitation 1/6 extubation 1/6 d5w for Na 155 1/7 CK 30K->1/8 33k  SUBJECTIVE:  01/03/16:Failed SBT earlier - was on high dose range of precedex . SEt off apnea alarms . Gets very agitated when not on sedation. Fever rose to 107F  dramatically improved - after Rx wuth  arctic sun pads 37.2. Marland Kitchen WBC improved. Seizure free on EEG per neuro 1/8 for MRI VITAL SIGNS: BP 129/77 mmHg  Pulse 87  Temp(Src) 98.5 F (36.9 C) (Oral)  Resp 13  Ht 5\' 8"  (1.727 m)  Wt 237 lb 14 oz (107.9 kg)  BMI 36.18 kg/m2  SpO2 94%  LMP   HEMODYNAMICS:    VENTILATOR SETTINGS:    INTAKE / OUTPUT: I/O last 3 completed shifts: In: 7225 [P.O.:480; I.V.:4980; IV Piggyback:1765] Out: 6075 [Urine:6075]  PHYSICAL EXAMINATION: General: Awake and alert on , reports unable to sleep, co vomiting with meds PULM: +rhonchi CV: RRR, no mgr GI: BS+, soft, nontender MSK: normal bulk and tone Derm: normal skin tone, dry, warm Neuro: RASS 0 alert LABS: PULMONARY  Recent Labs Lab 01/01/16 1245 01/02/16 0932  PHART 7.372 7.333*  PCO2ART 39.1 45.4*  PO2ART 190* 82.0  HCO3 22.2 22.1  TCO2 20.0 20.0  O2SAT 99.1 91.2    CBC  Recent Labs Lab 01/04/16 0510 01/05/16 0536 01/06/16 0445  HGB 10.9* 11.8* 12.5  HCT 35.0* 38.0 38.7  WBC 11.1* 12.8* 11.8*  PLT 192 231 280    COAGULATION No results for  input(s): INR in the last 168 hours.  CARDIAC  No results for input(s): TROPONINI in the last 168 hours. No results for input(s): PROBNP in the last 168 hours.   CHEMISTRY  Recent Labs Lab 01/04/16 0510 01/04/16 0511 01/05/16 0536 01/05/16 1738 01/06/16 0445  NA 155* 155* 155* 148* 149*  K 3.4* 3.5 2.9* 2.8* 2.9*  CL 122* 122* 116* 109 111  CO2 26 27 29 28 28   GLUCOSE 155* 156* 143* 164* 163*  BUN 16 16 7 7  <5*  CREATININE 0.94 1.00 0.78 0.69 0.72  CALCIUM 8.5* 8.5* 8.6* 8.6* 8.6*  MG 1.9  --  1.9  --  2.1  PHOS 1.4* 1.3* 2.4* 2.8 2.2*   Estimated Creatinine Clearance: 117.9 mL/min (by C-G formula based on Cr of 0.72).   LIVER  Recent Labs Lab 01/02/16 0400 01/03/16 0540 01/04/16 0511 01/05/16 0536 01/06/16 0445  ALBUMIN 3.4* 2.6* 2.2* 2.6* 2.4*     INFECTIOUS  Recent Labs Lab 01/03/16 1030  01/04/16 0510 01/04/16 0513 01/04/16 1015 01/04/16 1205 01/05/16 0536  LATICACIDVEN 0.9  < >  --  1.0 1.1 1.1  --   PROCALCITON 0.98  --  0.71  --   --   --  0.41  < > = values in this interval not displayed.   ENDOCRINE CBG (last 3)   Recent Labs  01/05/16 2344 01/06/16 0351 01/06/16 0756  GLUCAP 118* 164* 146*         IMAGING x48h  - image(s) personally visualized  -   highlighted in bold Dg Chest Port 1 View  01/06/2016  CLINICAL DATA:  45 year old female with respiratory failure EXAM: PORTABLE CHEST 1 VIEW COMPARISON:  Prior chest x-ray 01/05/2016 FINDINGS: Stable position of left upper extremity PICC with the tip overlying the superior cavoatrial junction. Inspiratory volumes are lower today resulting in crowding of the pulmonary vasculature. There is pulmonary venous congestion without overt edema. Mild bibasilar atelectasis. Stable borderline cardiomegaly. No acute osseous abnormality. IMPRESSION: 1. Lower inspiratory volumes with crowding of the pulmonary vasculature and bibasilar atelectasis. 2. Mild vascular congestion without overt edema. 3.  Stable position of left upper extremity PICC. Electronically Signed   By: Malachy MoanHeath  McCullough M.D.   On: 01/06/2016 08:37   Dg Chest Port 1 View  01/05/2016  CLINICAL DATA:  Patient with respiratory failure. EXAM: PORTABLE CHEST 1 VIEW COMPARISON:  Chest radiograph 01/04/2016. FINDINGS: Left upper extremity PICC line tip projects over the right atrium, unchanged. Interval extubation. Stable cardiac and mediastinal contours. Significant interval improvement and near complete resolution of previously described bilateral diffuse airspace opacities. No pleural effusion or pneumothorax. IMPRESSION: Marked interval improvement in previously described airspace opacities. Interval extubation. Electronically Signed   By: Annia Beltrew  Rolland M.D.   On: 01/05/2016 07:43   Dg Chest Port 1 View  01/04/2016  CLINICAL DATA:  Respiratory failure EXAM: PORTABLE CHEST 1 VIEW COMPARISON:  01/03/2016 FINDINGS: Endotracheal tube in good position. Central venous catheter tip in the right atrium unchanged. NG tube in the stomach with the tip not visualized. Diffuse bilateral airspace disease unchanged.  No effusion. IMPRESSION: Support lines remain in good position No significant change diffuse bilateral airspace disease. Electronically Signed   By: Marlan Palauharles  Clark M.D.   On: 01/04/2016 09:25        ASSESSMENT / PLAN:  NEUROLOGIC A:   Persistent agitation, now with high fever: etiology uncertain: DT's? Benzo withdrawal? Serotonin, NMS, Malignant hyperthremia don't fit with labs, exam or medications. Fever down CK's up 1/7 Benzo overdose initially  Chronic benzodiazepine abuse Suicide Attempt    P:   Precedex gtt -off Continue clonazepam bid Psychiatry consult once patient extubated & suicide precautions, called 1/8 MRI head 1/8  PULMONARY A: Acute Hypoxic Respiratory Failure > resolved Aspiration Pneumonitis > better Post extubation stridor > emergent re-intubation 1/6 post decadron dosessing     P:   Extubated  1/6  CARDIOVASCULAR A:  Nil acute P:  Monitor patient on telemetry Vitals per unit protocol  RENAL Lab Results  Component Value Date   CREATININE 0.72 01/06/2016   CREATININE 0.69 01/05/2016   CREATININE 0.78 01/05/2016     Recent Labs Lab 01/05/16 0536 01/05/16 1738 01/06/16 0445  K 2.9* 2.8* 2.9*    Recent Labs Lab 01/05/16 0536 01/05/16 1738 01/06/16 0445  NA 155* 148* 149*     A:   Hypokalemia  Mild rhabdo CK 1100s on 11/01/16 - peaked at 6624 on 1/5 on 1/6  3594 goes along with drug syndromes 1/7 CK 30,783 !! 1/8 33,221 P:   Kcl per e link, FU bmet at 1800 Monitor BMET and UOP Replace electrolytes as needed(kphos 1/7) IVF d5 w at 150 hr for na 155 on 1/7, advance diet, 1/8 continue d5 w 1/7 recheck CK ?  Source for rhabdo 1/8 no source for elevated ck continue to monitor  GASTROINTESTINAL  A:   Nausea/vomiting - none on 01/04/16 1/8 vomits with meds P:   1/7 start bid PPI zofran prn   HEMATOLOGIC A:   No acute issues  P:  Monitor for bleeding Lovenox [place] daily  SCDs  INFECTIOUS A: HCAP/Aspiration pneumonia  At admit 12/29/2015 High grade fever 107F - on 01/02/16   -escalation after start unasyn 12/31/15  - normal PCT and lactate and negative cultures so far as of 01/03/16  - CK elevated  - Ddx is sepsis v drug fever v drug syndromes v some combo  01/03/16 - fever controlled but on arctic sun pads @ 37.9F 1/6 fever 103 off arctic sun  1/7 afebrile 1/8 aferbrile  P:  monitor fever off arctic sun pads await Re culture 01/02/16 DC unasyn - possible drug fever Start levaquin (check QTc on 12 lead) - and start if Qtc < (it is 0.35 on monitor) 1/6 procal->0.71 Tylenol   ENDOCRINE CBG (last 3)   Recent Labs  01/05/16 2344 01/06/16 0351 01/06/16 0756  GLUCAP 118* 164* 146*     A:   Hyperglycemia - No h/o DM    P:   Accu-Checks q4hr Low dose SSI per algorithm    PSYCHIATRIC A: Suicide Attempt with Overdose H/O  Depression/Anxiety  P: Suicide precautions and psych consult once extubated and less confused 1/8 Psych called and message left on machine.   FAMILY  - Updates: Husband updated 1/6 per SM. - Inter-disciplinary family meet or Palliative Care meeting due by:  01/04/16   Summary: 1/6 extubated. Neuro intact. Hypernatremia and fever addressed. 1/7 Insomnia and GERD noted. 1/8 CK continues to rise, will continue to monitor. Complains of general all over pains. Vomits up PO meds. For MRI 1/8.   Brett Canales Amiayah Giebel ACNP [facility] Pager [phone] till 3 pm If no answer page [phone] 01/06/2016, 9:10 AM

## 2016-01-07 DIAGNOSIS — T1491 Suicide attempt: Secondary | ICD-10-CM

## 2016-01-07 DIAGNOSIS — F3130 Bipolar disorder, current episode depressed, mild or moderate severity, unspecified: Secondary | ICD-10-CM

## 2016-01-07 DIAGNOSIS — T424X2A Poisoning by benzodiazepines, intentional self-harm, initial encounter: Principal | ICD-10-CM

## 2016-01-07 DIAGNOSIS — R45851 Suicidal ideations: Secondary | ICD-10-CM

## 2016-01-07 LAB — BASIC METABOLIC PANEL
Anion gap: 10 (ref 5–15)
BUN: <5 mg/dL — ABNORMAL LOW (ref 6–20)
CALCIUM: 8.7 mg/dL — AB (ref 8.9–10.3)
CO2: 27 mmol/L (ref 22–32)
CREATININE: 0.76 mg/dL (ref 0.44–1.00)
Chloride: 110 mmol/L (ref 101–111)
GLUCOSE: 164 mg/dL — AB (ref 65–99)
Potassium: 3.4 mmol/L — ABNORMAL LOW (ref 3.5–5.1)
Sodium: 147 mmol/L — ABNORMAL HIGH (ref 135–145)

## 2016-01-07 LAB — CULTURE, BLOOD (ROUTINE X 2)
CULTURE: NO GROWTH
Culture: NO GROWTH

## 2016-01-07 LAB — URINALYSIS, ROUTINE W REFLEX MICROSCOPIC
BILIRUBIN URINE: NEGATIVE
Glucose, UA: NEGATIVE mg/dL
Ketones, ur: NEGATIVE mg/dL
Leukocytes, UA: NEGATIVE
NITRITE: NEGATIVE
PH: 7 (ref 5.0–8.0)
Protein, ur: 100 mg/dL — AB
SPECIFIC GRAVITY, URINE: 1.01 (ref 1.005–1.030)

## 2016-01-07 LAB — CBC WITH DIFFERENTIAL/PLATELET
BASOS ABS: 0 10*3/uL (ref 0.0–0.1)
Basophils Relative: 0 %
Eosinophils Absolute: 0.3 10*3/uL (ref 0.0–0.7)
Eosinophils Relative: 3 %
HEMATOCRIT: 40 % (ref 36.0–46.0)
HEMOGLOBIN: 12.3 g/dL (ref 12.0–15.0)
LYMPHS PCT: 18 %
Lymphs Abs: 2 10*3/uL (ref 0.7–4.0)
MCH: 30.4 pg (ref 26.0–34.0)
MCHC: 30.8 g/dL (ref 30.0–36.0)
MCV: 98.8 fL (ref 78.0–100.0)
MONO ABS: 0.6 10*3/uL (ref 0.1–1.0)
MONOS PCT: 5 %
NEUTROS ABS: 8.6 10*3/uL — AB (ref 1.7–7.7)
Neutrophils Relative %: 74 %
Platelets: 356 10*3/uL (ref 150–400)
RBC: 4.05 MIL/uL (ref 3.87–5.11)
RDW: 13.2 % (ref 11.5–15.5)
WBC: 11.5 10*3/uL — ABNORMAL HIGH (ref 4.0–10.5)

## 2016-01-07 LAB — GLUCOSE, CAPILLARY
GLUCOSE-CAPILLARY: 150 mg/dL — AB (ref 65–99)
GLUCOSE-CAPILLARY: 157 mg/dL — AB (ref 65–99)
Glucose-Capillary: 142 mg/dL — ABNORMAL HIGH (ref 65–99)

## 2016-01-07 LAB — ALBUMIN: ALBUMIN: 2.7 g/dL — AB (ref 3.5–5.0)

## 2016-01-07 LAB — PHOSPHORUS: PHOSPHORUS: 3.1 mg/dL (ref 2.5–4.6)

## 2016-01-07 LAB — MAGNESIUM: MAGNESIUM: 2.2 mg/dL (ref 1.7–2.4)

## 2016-01-07 LAB — URINE MICROSCOPIC-ADD ON

## 2016-01-07 LAB — CK: Total CK: >50000 U/L — ABNORMAL HIGH (ref 38–234)

## 2016-01-07 MED ORDER — LEVETIRACETAM 500 MG PO TABS
1000.0000 mg | ORAL_TABLET | Freq: Two times a day (BID) | ORAL | Status: DC
  Administered 2016-01-07 – 2016-01-09 (×5): 1000 mg via ORAL
  Filled 2016-01-07 (×6): qty 2

## 2016-01-07 MED ORDER — POTASSIUM CHLORIDE CRYS ER 20 MEQ PO TBCR
20.0000 meq | EXTENDED_RELEASE_TABLET | ORAL | Status: DC
Start: 2016-01-07 — End: 2016-01-07
  Administered 2016-01-07: 20 meq via ORAL
  Filled 2016-01-07 (×2): qty 1

## 2016-01-07 MED ORDER — DEXTROSE-NACL 5-0.45 % IV SOLN
INTRAVENOUS | Status: DC
  Administered 2016-01-08: 1000 mL via INTRAVENOUS
  Administered 2016-01-08 – 2016-01-09 (×2): via INTRAVENOUS

## 2016-01-07 NOTE — Progress Notes (Signed)
Pt medically stable for transfer to medical floor today s/p suicide attempt.  Pt still has suicide sitter; psych eval pending.  Will follow progress.  Quintella BatonJulie W. Deauna Yaw, RN, BSN  Trauma/Neuro ICU Case Manager [phone]

## 2016-01-07 NOTE — Progress Notes (Signed)
Patient trasfered from 5M to [phone] via wheelchair; alert and oriented x 4; no complaints of pain; patient has a double lumen PICC line on RUA running D5with 0.45NS@75cc /hr; skin intact. Orient patient to room and unit; sitter at bedside; instructed how to use the call bell and  fall risk precautions. Will continue to monitor the patient.

## 2016-01-07 NOTE — Progress Notes (Signed)
Nutrition Follow-up  DOCUMENTATION CODES:   Obesity unspecified  INTERVENTION:   Ensure Enlive po BID, each supplement provides 350 kcal and 20 grams of protein  NUTRITION DIAGNOSIS:   Inadequate oral intake related to altered GI function as evidenced by meal completion < 50%. Ongoing.   GOAL:   Patient will meet greater than or equal to 90% of their needs Not yet met.   MONITOR:   PO intake, Supplement acceptance, Labs, I & O's  ASSESSMENT:   47F with history of depression and anxiety with outpatient prescriptions for Klonopin and Lithium who presented to the [facility] ED on 12/30 after an intentional overdose of benzodiazepines (klonopin, xanax) and alcohol. Reportedly she consumed 20 xanax, 12 Klonopin, 6 shots, 8-9 beers and then reported to the ED. She reportedly has been non-compliant with her lithium. It is unclear if she was taking seroquel. There was also some concern for benadryl or other anti-cholinergic substance. After arrival in the ED she became progressively more obtunded and required intubation  1/6 extubated Psych consult pending.  Pt reports nausea. Willing to try ensure. Meal completion 25%. Medications reviewed and include: miralax, zofran Labs reviewed: sodium elevated (147) CBG's: 132-150   Diet Order:  Diet regular Room service appropriate?: Yes; Fluid consistency:: Thin  Skin:  Reviewed, no issues  Last BM:  1/8  Height:   Ht Readings from Last 1 Encounters:  01/01/16 '5\' 8"'$  (1.727 m)   Weight:   Wt Readings from Last 1 Encounters:  01/07/16 238 lb 15.7 oz (108.4 kg)   Ideal Body Weight:  63.6 kg  BMI:  Body mass index is 36.34 kg/(m^2).  Estimated Nutritional Needs:   Kcal:  1800-2000  Protein:  100-115 grams  Fluid:  2L/day  EDUCATION NEEDS:   No education needs identified at this time  [place], [place], [place] Pager [phone] After Hours Pager

## 2016-01-07 NOTE — Progress Notes (Signed)
   01/07/16 1319  Clinical Encounter Type  Visited With Patient and family together  Visit Type Follow-up  Spiritual Encounters  Spiritual Needs Grief support  Stress Factors  Patient Stress Factors Loss   Chaplain followed up with the patient's husband and met the patient. Patient indicated that she would like some grief counseling, so chaplain gave her contact information for [facility] and their grief resources. Patient also indicated still feeling a bit unstable on her feet and needing assistance. Chaplain offered support, and our support is available as needed.   Jeri Lager, Chaplain 01/07/2016 1:25 PM

## 2016-01-07 NOTE — Clinical Social Work Psych Note (Signed)
Psych CSW was consulted due to c/o attempted overdose.  [facility].    Psychiatry now following.  Vickii PennaGina Jusitn Salsgiver, LCSW [phone]  5N1-9; 2S 15-16 and Hospital Psychiatric Service Line Licensed Clinical Social Worker

## 2016-01-07 NOTE — Progress Notes (Signed)
[facility] LAB REPLACEMENT ONLY  The patient does [facility] based on the criteria listed below:   1. Is GFR >/= 40 ml/min? Yes.    Patient's GFR today is >60 2. Is urine output >/= 0.5 ml/kg/hr for the last 6 hours? Yes.   Patient's UOP is 1.5 ml/kg/hr 3. Is BUN < 60 mg/dL? Yes.    Patient's BUN today is <5 4. Abnormal electrolyte(s): K 3.4 5. Ordered repletion with: per protocol 6. If a panic level lab has been reported, has the CCM MD in charge been notified? No..   Physician:    Markus DaftWHELAN, Deshundra Waller A 01/07/2016 6:38 AM

## 2016-01-07 NOTE — Consult Note (Signed)
[facility] Consult   Reason for Consult:  Intentional overdose Referring Physician:  Dr. Lake Bells Patient Identification: Stacie Sanders MRN:  [id number] Principal Diagnosis: Drug overdose Diagnosis:   Patient Active Problem List   Diagnosis Date Noted  . Acute respiratory failure ([place]) [J96.00] 01/03/2016  . Acute respiratory failure with hypoxemia ([place]) [J96.01]   . Acute encephalopathy [G93.40]   . Drug overdose [T50.901A] 12/29/2015  . Overdose [T50.901A] 12/29/2015    Total Time spent with patient: 1 hour  Subjective:   Stacie Sanders is a 45 y.o. female patient admitted with Intentional overdose of benzo's and alcohol.  HPI:  Stacie Medicus. Sanders is a 9 married female admitted to [facility] for intentional overdose of benzodiazepines and reportedly had a seizure and required in and out intubation due to obtunded and self extubations. Patient has been suffering with bipolar disorder since her teen age years. She has committed planned suicide attempt due to loss of her grandson (43M) in March 2017. Her psych medication has changed due to significant weight gain. She does not want to be placed back on lithium and Latuda. She is in agreement with Lamictal at this time. She lives with her husband and two grown up sons (19, 63). Reportedly her son has suicide attempt in June 2016. She continue to endorse symptoms of depression and suicide ideation but stated that her grandson told her it is not her time and she should go home while she is dying after the overdose.    Reportedly she consumed 20 xanax, 12 Klonopin, 6 shots, 8-9 beers and then reported to the ED. She reportedly has been non-compliant with her lithium. It is unclear if she was taking seroquel. There was also some concern for benadryl or other anti-cholinergic substance. After arrival in the ED she became progressively more obtunded and required intubation.   Past Psychiatric History: [facility] service of [place] provided  treatment for bipolar disorder. NO [facility] admissions.  Risk to Self: Is patient at risk for suicide?: Yes Risk to Others:   Prior Inpatient Therapy:   Prior Outpatient Therapy:    Past Medical History:  Past Medical History  Diagnosis Date  . Depression   . Kidney stone     Past Surgical History  Procedure Laterality Date  . Tubal ligation    . Lithotripsy     Family History: History reviewed. No pertinent family history. Family Psychiatric  History: unknown Social History:  History  Alcohol Use  . Yes    Comment: occasionally     History  Drug Use No    Social History   Social History  . Marital Status: Married    Spouse Name: N/A  . Number of Children: N/A  . Years of Education: N/A   Social History Main Topics  . Smoking status: Current Every Day Smoker    Types: Cigarettes  . Smokeless tobacco: None  . Alcohol Use: Yes     Comment: occasionally  . Drug Use: No  . Sexual Activity: Not Asked   Other Topics Concern  . None   Social History Narrative   Additional Social History:                          Allergies:   Allergies  Allergen Reactions  . Fentanyl Itching  . Percocet [Oxycodone-Acetaminophen] Hives and Itching    Labs:  Results for orders placed or performed during the hospital encounter of 12/29/15 (from the past  48 hour(s))  CK     Status: Abnormal   Collection Time: 01/05/16 12:04 PM  Result Value Ref Range   Total CK 21856 (H) 38 - 234 U/L    Comment: RESULTS CONFIRMED BY MANUAL DILUTION  Glucose, capillary     Status: Abnormal   Collection Time: 01/05/16 12:11 PM  Result Value Ref Range   Glucose-Capillary 166 (H) 65 - 99 mg/dL  Glucose, capillary     Status: Abnormal   Collection Time: 01/05/16  4:02 PM  Result Value Ref Range   Glucose-Capillary 142 (H) 65 - 99 mg/dL  Basic metabolic panel     Status: Abnormal   Collection Time: 01/05/16  5:38 PM  Result Value Ref Range   Sodium 148 (H) 135 - 145 mmol/L    Potassium 2.8 (L) 3.5 - 5.1 mmol/L   Chloride 109 101 - 111 mmol/L   CO2 28 22 - 32 mmol/L   Glucose, Bld 164 (H) 65 - 99 mg/dL   BUN 7 6 - 20 mg/dL   Creatinine, Ser 0.69 0.44 - 1.00 mg/dL   Calcium 8.6 (L) 8.9 - 10.3 mg/dL   GFR calc non Af Amer >60 >60 mL/min   GFR calc Af Amer >60 >60 mL/min    Comment: (NOTE) The eGFR has been calculated using the CKD EPI equation. This calculation has not been validated in all clinical situations. eGFR's persistently <60 mL/min signify possible Chronic Kidney Disease.    Anion gap 11 5 - 15  Phosphorus     Status: None   Collection Time: 01/05/16  5:38 PM  Result Value Ref Range   Phosphorus 2.8 2.5 - 4.6 mg/dL  Glucose, capillary     Status: Abnormal   Collection Time: 01/05/16  8:08 PM  Result Value Ref Range   Glucose-Capillary 153 (H) 65 - 99 mg/dL  Glucose, capillary     Status: Abnormal   Collection Time: 01/05/16 11:44 PM  Result Value Ref Range   Glucose-Capillary 118 (H) 65 - 99 mg/dL  Glucose, capillary     Status: Abnormal   Collection Time: 01/06/16  3:51 AM  Result Value Ref Range   Glucose-Capillary 164 (H) 65 - 99 mg/dL  Renal function panel     Status: Abnormal   Collection Time: 01/06/16  4:45 AM  Result Value Ref Range   Sodium 149 (H) 135 - 145 mmol/L   Potassium 2.9 (L) 3.5 - 5.1 mmol/L   Chloride 111 101 - 111 mmol/L   CO2 28 22 - 32 mmol/L   Glucose, Bld 163 (H) 65 - 99 mg/dL   BUN <5 (L) 6 - 20 mg/dL   Creatinine, Ser 0.72 0.44 - 1.00 mg/dL   Calcium 8.6 (L) 8.9 - 10.3 mg/dL   Phosphorus 2.2 (L) 2.5 - 4.6 mg/dL   Albumin 2.4 (L) 3.5 - 5.0 g/dL   GFR calc non Af Amer >60 >60 mL/min   GFR calc Af Amer >60 >60 mL/min    Comment: (NOTE) The eGFR has been calculated using the CKD EPI equation. This calculation has not been validated in all clinical situations. eGFR's persistently <60 mL/min signify possible Chronic Kidney Disease.    Anion gap 10 5 - 15  CBC with Differential/Platelet     Status: Abnormal    Collection Time: 01/06/16  4:45 AM  Result Value Ref Range   WBC 11.8 (H) 4.0 - 10.5 K/uL   RBC 3.96 3.87 - 5.11 MIL/uL   Hemoglobin 12.5 12.0 -  15.0 g/dL   HCT 38.7 36.0 - 46.0 %   MCV 97.7 78.0 - 100.0 fL   MCH 31.6 26.0 - 34.0 pg   MCHC 32.3 30.0 - 36.0 g/dL   RDW 13.2 11.5 - 15.5 %   Platelets 280 150 - 400 K/uL   Neutrophils Relative % 76 %   Neutro Abs 9.0 (H) 1.7 - 7.7 K/uL   Lymphocytes Relative 16 %   Lymphs Abs 1.9 0.7 - 4.0 K/uL   Monocytes Relative 5 %   Monocytes Absolute 0.6 0.1 - 1.0 K/uL   Eosinophils Relative 3 %   Eosinophils Absolute 0.4 0.0 - 0.7 K/uL   Basophils Relative 0 %   Basophils Absolute 0.0 0.0 - 0.1 K/uL  CK     Status: Abnormal   Collection Time: 01/06/16  4:45 AM  Result Value Ref Range   Total CK 33221 (H) 38 - 234 U/L    Comment: RESULTS CONFIRMED BY MANUAL DILUTION  Magnesium     Status: None   Collection Time: 01/06/16  4:45 AM  Result Value Ref Range   Magnesium 2.1 1.7 - 2.4 mg/dL  Glucose, capillary     Status: Abnormal   Collection Time: 01/06/16  7:56 AM  Result Value Ref Range   Glucose-Capillary 146 (H) 65 - 99 mg/dL  Glucose, capillary     Status: Abnormal   Collection Time: 01/06/16  2:09 PM  Result Value Ref Range   Glucose-Capillary 141 (H) 65 - 99 mg/dL  Glucose, capillary     Status: Abnormal   Collection Time: 01/06/16  3:53 PM  Result Value Ref Range   Glucose-Capillary 126 (H) 65 - 99 mg/dL  Glucose, capillary     Status: Abnormal   Collection Time: 01/06/16  8:06 PM  Result Value Ref Range   Glucose-Capillary 120 (H) 65 - 99 mg/dL  BMET today at 2000     Status: Abnormal   Collection Time: 01/06/16  8:37 PM  Result Value Ref Range   Sodium 148 (H) 135 - 145 mmol/L   Potassium 3.2 (L) 3.5 - 5.1 mmol/L   Chloride 108 101 - 111 mmol/L   CO2 28 22 - 32 mmol/L   Glucose, Bld 150 (H) 65 - 99 mg/dL   BUN <5 (L) 6 - 20 mg/dL   Creatinine, Ser 0.66 0.44 - 1.00 mg/dL   Calcium 8.7 (L) 8.9 - 10.3 mg/dL   GFR calc  non Af Amer >60 >60 mL/min   GFR calc Af Amer >60 >60 mL/min    Comment: (NOTE) The eGFR has been calculated using the CKD EPI equation. This calculation has not been validated in all clinical situations. eGFR's persistently <60 mL/min signify possible Chronic Kidney Disease.    Anion gap 12 5 - 15  Glucose, capillary     Status: Abnormal   Collection Time: 01/06/16 11:50 PM  Result Value Ref Range   Glucose-Capillary 132 (H) 65 - 99 mg/dL  Glucose, capillary     Status: Abnormal   Collection Time: 01/07/16  3:33 AM  Result Value Ref Range   Glucose-Capillary 142 (H) 65 - 99 mg/dL  CBC with Differential/Platelet     Status: Abnormal   Collection Time: 01/07/16  4:50 AM  Result Value Ref Range   WBC 11.5 (H) 4.0 - 10.5 K/uL   RBC 4.05 3.87 - 5.11 MIL/uL   Hemoglobin 12.3 12.0 - 15.0 g/dL   HCT 40.0 36.0 - 46.0 %   MCV 98.8 78.0 -  100.0 fL   MCH 30.4 26.0 - 34.0 pg   MCHC 30.8 30.0 - 36.0 g/dL   RDW 13.2 11.5 - 15.5 %   Platelets 356 150 - 400 K/uL   Neutrophils Relative % 74 %   Neutro Abs 8.6 (H) 1.7 - 7.7 K/uL   Lymphocytes Relative 18 %   Lymphs Abs 2.0 0.7 - 4.0 K/uL   Monocytes Relative 5 %   Monocytes Absolute 0.6 0.1 - 1.0 K/uL   Eosinophils Relative 3 %   Eosinophils Absolute 0.3 0.0 - 0.7 K/uL   Basophils Relative 0 %   Basophils Absolute 0.0 0.0 - 0.1 K/uL  Magnesium     Status: None   Collection Time: 01/07/16  4:50 AM  Result Value Ref Range   Magnesium 2.2 1.7 - 2.4 mg/dL  Phosphorus     Status: None   Collection Time: 01/07/16  4:50 AM  Result Value Ref Range   Phosphorus 3.1 2.5 - 4.6 mg/dL  BMET in AM     Status: Abnormal   Collection Time: 01/07/16  4:50 AM  Result Value Ref Range   Sodium 147 (H) 135 - 145 mmol/L   Potassium 3.4 (L) 3.5 - 5.1 mmol/L   Chloride 110 101 - 111 mmol/L   CO2 27 22 - 32 mmol/L   Glucose, Bld 164 (H) 65 - 99 mg/dL   BUN <5 (L) 6 - 20 mg/dL   Creatinine, Ser 0.76 0.44 - 1.00 mg/dL   Calcium 8.7 (L) 8.9 - 10.3 mg/dL    GFR calc non Af Amer >60 >60 mL/min   GFR calc Af Amer >60 >60 mL/min    Comment: (NOTE) The eGFR has been calculated using the CKD EPI equation. This calculation has not been validated in all clinical situations. eGFR's persistently <60 mL/min signify possible Chronic Kidney Disease.    Anion gap 10 5 - 15  CK     Status: Abnormal   Collection Time: 01/07/16  4:50 AM  Result Value Ref Range   Total CK >50000 (H) 38 - 234 U/L    Comment: RESULTS CONFIRMED BY MANUAL DILUTION  Albumin     Status: Abnormal   Collection Time: 01/07/16  4:50 AM  Result Value Ref Range   Albumin 2.7 (L) 3.5 - 5.0 g/dL  Glucose, capillary     Status: Abnormal   Collection Time: 01/07/16  7:57 AM  Result Value Ref Range   Glucose-Capillary 150 (H) 65 - 99 mg/dL    Current Facility-Administered Medications  Medication Dose Route Frequency Provider Last Rate Last Dose  . 0.9 %  sodium chloride infusion  250 mL Intravenous PRN Dannielle Burn, MD   Stopped at 01/03/16 2026  . acetaminophen (TYLENOL) suppository 650 mg  650 mg Rectal Q6H PRN Juanito Doom, MD   650 mg at 01/04/16 0849  . acetaminophen (TYLENOL) tablet 650 mg  650 mg Oral Q6H PRN Brand Males, MD   650 mg at 01/05/16 2159  . bisacodyl (DULCOLAX) EC tablet 5 mg  5 mg Oral Daily PRN Juanito Doom, MD   5 mg at 01/05/16 0945  . clonazePAM (KLONOPIN) tablet 0.5 mg  0.5 mg Oral BID Juanito Doom, MD   0.5 mg at 01/07/16 0937  . clonazePAM (KLONOPIN) tablet 0.5 mg  0.5 mg Oral BID PRN Brand Males, MD   0.5 mg at 01/04/16 1835  . diphenhydrAMINE (BENADRYL) injection 12.5 mg  12.5 mg Intravenous Q8H PRN Grace Bushy Minor,  NP      . docusate sodium (COLACE) capsule 100 mg  100 mg Oral BID PRN Roslynn Amble, MD   100 mg at 01/05/16 0945  . enoxaparin (LOVENOX) injection 40 mg  40 mg Subcutaneous Q24H Orville Govern, MD   40 mg at 01/07/16 5677  . famotidine (PEPCID) IVPB 20 mg premix  20 mg Intravenous Q12H Lupita Leash, MD   20 mg at 01/07/16 0937  . ibuprofen (ADVIL,MOTRIN) 100 MG/5ML suspension 400 mg  400 mg Per Tube Q6H PRN Roslynn Amble, MD   400 mg at 01/01/16 2233  . insulin aspart (novoLOG) injection 0-9 Units  0-9 Units Subcutaneous 6 times per day Roslynn Amble, MD   1 Units at 01/07/16 [phone]  . ipratropium-albuterol (DUONEB) 0.5-2.5 (3) MG/3ML nebulizer solution 3 mL  3 mL Nebulization Q4H PRN Nelda Bucks, MD      . levETIRAcetam (KEPPRA) 1,000 mg in sodium chloride 0.9 % 100 mL IVPB  1,000 mg Intravenous Q12H Ramond Marrow, DO   1,000 mg at 01/07/16 1122  . levofloxacin (LEVAQUIN) IVPB 500 mg  500 mg Intravenous Q24H Kalman Shan, MD   500 mg at 01/07/16 1122  . ondansetron (ZOFRAN) injection 4 mg  4 mg Intravenous Q6H PRN Lupita Leash, MD   4 mg at 01/07/16 0155  . oxyCODONE-acetaminophen (PERCOCET/ROXICET) 5-325 MG per tablet 1-2 tablet  1-2 tablet Oral Q6H PRN Lupita Leash, MD   2 tablet at 01/07/16 1118  . pantoprazole (PROTONIX) EC tablet 40 mg  40 mg Oral BID AC William S Minor, NP   40 mg at 01/07/16 0808  . polyethylene glycol (MIRALAX / GLYCOLAX) packet 17 g  17 g Oral Daily Lupita Leash, MD   17 g at 01/07/16 0937  . potassium chloride SA (K-DUR,KLOR-CON) CR tablet 20 mEq  20 mEq Oral Q4H Karl Ito, MD   20 mEq at 01/07/16 [phone]  . sennosides (SENOKOT) 8.8 MG/5ML syrup 5 mL  5 mL Per Tube Daily PRN Lupita Leash, MD      . sodium chloride 0.9 % injection 10-40 mL  10-40 mL Intracatheter Q12H Roslynn Amble, MD   20 mL at 01/07/16 0039  . sodium chloride 0.9 % injection 10-40 mL  10-40 mL Intracatheter PRN Roslynn Amble, MD      . sodium chloride 0.9 % injection 10-40 mL  10-40 mL Intracatheter Q12H Kalman Shan, MD   10 mL at 01/06/16 1025  . sodium chloride 0.9 % injection 10-40 mL  10-40 mL Intracatheter PRN Kalman Shan, MD        Musculoskeletal: Strength & Muscle Tone: decreased Gait & Station: unable to stand Patient  leans: N/A  Psychiatric Specialty Exam: ROS generalized weakness and depression.  No Fever-chills, No Headache, No changes with Vision or hearing, reports vertigo No problems swallowing food or Liquids, No Chest pain, Cough or Shortness of Breath, No Abdominal pain, No Nausea or Vommitting, Bowel movements are regular, No Blood in stool or Urine, No dysuria, No new skin rashes or bruises, No new joints pains-aches,  No new weakness, tingling, numbness in any extremity, No recent weight gain or loss, No polyuria, polydypsia or polyphagia,   A full 10 point Review of Systems was done, except as stated above, all other Review of Systems were negative.  Blood pressure 146/68, pulse 129, temperature 98.4 F (36.9 C), temperature source Oral, resp. rate 18, height 5\' 8"  (1.727  m), weight 108.4 kg (238 lb 15.7 oz), SpO2 97 %.Body mass index is 36.34 kg/(m^2).  General Appearance: Guarded  Eye Contact::  Fair  Speech:  Clear and Coherent and Slow  Volume:  Decreased  Mood:  Anxious and Depressed  Affect:  Constricted and Depressed  Thought Process:  Coherent and Goal Directed  Orientation:  Full (Time, Place, and Person)  Thought Content:  Rumination  Suicidal Thoughts:  Yes.  with intent/plan  Homicidal Thoughts:  No  Memory:  Immediate;   Good Recent;   Good  Judgement:  Impaired  Insight:  Fair  Psychomotor Activity:  Decreased  Concentration:  Fair  Recall:  [address] of Knowledge:Good  Language: Good  Akathisia:  Negative  Handed:  Right  AIMS (if indicated):     Assets:  Communication Skills Desire for Improvement Financial Resources/Insurance Housing Intimacy Leisure Time Resilience Social Support Talents/Skills Transportation  ADL's:  Impaired  Cognition: WNL  Sleep:      Treatment Plan Summary: Daily contact with patient to assess and evaluate symptoms and progress in treatment and Medication management  Continue current medication treatment and add  lamotrigine 25 mg PO BID for mood swings Appreciate psychiatric consultation and follow up as clinically required Please contact [phone] or [phone] if needs further assistance  Disposition: Recommend psychiatric Inpatient admission when medically cleared. Supportive therapy provided about ongoing stressors.  Contact unit social service for placement needs when medically stable.  Wren Gallaga,JANARDHAHA R. 01/07/2016 11:51 AM

## 2016-01-07 NOTE — Progress Notes (Signed)
Notified Dr. Ramaswamy that Stacie Gearingpt's urine is red in color. Pt states that she hasn't had a menstrual period in a long time. MD gave order for UA and urine culture. Will continue to monitor pt. Nelda MarseilleJenny Thacker, RN

## 2016-01-07 NOTE — Progress Notes (Addendum)
PULMONARY / CRITICAL CARE MEDICINE   Name: Stacie Sanders MRN: [id number] DOB: July 08, 1971    ADMISSION DATE:  12/29/2015  REFERRING MD:  ER  CHIEF COMPLAINT:  Overdose  SUBJECTIVE:  Muscles are sore.  VITAL SIGNS: BP 146/68 mmHg  Pulse 129  Temp(Src) 98.4 F (36.9 C) (Oral)  Resp 18  Ht 5\' 8"  (1.727 m)  Wt 238 lb 15.7 oz (108.4 kg)  BMI 36.34 kg/m2  SpO2 97%  LMP   INTAKE / OUTPUT: I/O last 3 completed shifts: In: 6180 [P.O.:600; I.V.:4610; IV Piggyback:970] Out: 8100 [Urine:8100]  PHYSICAL EXAMINATION: General:  Alert Neuro:  Follows commands HEENT:  Pupils reactive Cardiovascular:  Regular, no murmur Lungs:  No wheeze Abdomen:  Soft, non tender Musculoskeletal:  No edema Skin:  No rashes  LABS:  BMET  Recent Labs Lab 01/06/16 0445 01/06/16 2037 01/07/16 0450  NA 149* 148* 147*  K 2.9* 3.2* 3.4*  CL 111 108 110  CO2 28 28 27   BUN <5* <5* <5*  CREATININE 0.72 0.66 0.76  GLUCOSE 163* 150* 164*    Electrolytes  Recent Labs Lab 01/05/16 0536 01/05/16 1738 01/06/16 0445 01/06/16 2037 01/07/16 0450  CALCIUM 8.6* 8.6* 8.6* 8.7* 8.7*  MG 1.9  --  2.1  --  2.2  PHOS 2.4* 2.8 2.2*  --  3.1    CBC  Recent Labs Lab 01/05/16 0536 01/06/16 0445 01/07/16 0450  WBC 12.8* 11.8* 11.5*  HGB 11.8* 12.5 12.3  HCT 38.0 38.7 40.0  PLT 231 280 356    Coag's No results for input(s): APTT, INR in the last 168 hours.  Sepsis Markers  Recent Labs Lab 01/03/16 1030  01/04/16 0510 01/04/16 0513 01/04/16 1015 01/04/16 1205 01/05/16 0536  LATICACIDVEN 0.9  < >  --  1.0 1.1 1.1  --   PROCALCITON 0.98  --  0.71  --   --   --  0.41  < > = values in this interval not displayed.  ABG  Recent Labs Lab 01/01/16 1245 01/02/16 0932  PHART 7.372 7.333*  PCO2ART 39.1 45.4*  PO2ART 190* 82.0    Liver Enzymes  Recent Labs Lab 01/05/16 0536 01/06/16 0445 01/07/16 0450  ALBUMIN 2.6* 2.4* 2.7*    Cardiac Enzymes No results for input(s):  TROPONINI, PROBNP in the last 168 hours.  Glucose  Recent Labs Lab 01/06/16 1409 01/06/16 1553 01/06/16 2006 01/06/16 2350 01/07/16 0333 01/07/16 0757  GLUCAP 141* 126* 120* 132* 142* 150*    Imaging Mr Brain Wo Contrast  01/06/2016  CLINICAL DATA:  Acute encephalopathy. Benzodiazepine overdose. Left upper extremity pain and subjective weakness. EXAM: MRI HEAD WITHOUT CONTRAST TECHNIQUE: Multiplanar, multiecho pulse sequences of the brain and surrounding structures were obtained without intravenous contrast. COMPARISON:  Head CT 01/02/2016 FINDINGS: There is no evidence of acute infarct, intracranial hemorrhage, mass, midline shift, or extra-axial fluid collection. Ventricles and sulci are normal. The brain is normal in signal. Orbits are unremarkable. There is mild circumferential left sphenoid sinus mucosal thickening. Small fluid levels are present in both maxillary sinuses, and there are moderate right and small left mastoid effusions. Major intracranial vascular flow voids are preserved. IMPRESSION: 1. Unremarkable appearance of the brain. 2. Bilateral maxillary sinus fluid. Correlate clinically for acute sinusitis. 3. Right larger than left mastoid effusions. Electronically Signed   By: Sebastian Ache M.D.   On: 01/06/2016 12:49     STUDIES:  01/04 EEG >> non-convulsive status epilepticus 01/08 MRI brain >> b/l maxillary sinus fluid  CULTURES: 12/31 Sputum >> oral flora 12/31 Blood >> negative 12/31 Urine >> negative 01/02 Blood >> negative 01/03 Urine >> negative 01/03 Sputum >> oral flora 01/04 Sputum >> moderate yeast (colonization) 01/04 Blood >>   ANTIBIOTICS: 12/31 Vancomycin >> 1/01 12/31 Zosyn >> 1/01 1/02 Unasyn >> 1/04 1/05 Levaquin >> 1/10  SIGNIFICANT EVENTS: 12/31 Admit  01/03 Extubated >> re-intubated for hypoxia, stridor 01/04 Fever 106.7, encephalopathy, neurology consulted, start AEDs 01/09 Psych consulted, transfer to medical floor  bed  LINES/TUBES: 12/31 ETT >> 01/03 01/03 ETT >> 01/06 01/05 Lt PICC >>   DISCUSSION: 45 yo female smoker brought to ER after intentional overdose of klonopin, xanax, alcohol, and possibly anti-histamine.  She has hx of depression/anxiety and was on seroquel, lithium >> not sure if she was compliant with therapy.  She was intubated for airway protection  ASSESSMENT / PLAN:  Acute encephalopathy 2nd to intentional drug overdose. Non convulsive status epilepticus. Plan: - f/u with psychiatry - AEDs per neurology - sitter at bedside - continue klonopin  Rhabdomyolysis, fever >> likely related to status epilepticus. Hypokalemia. Hypernatremia. Plan: - continue IV fluids D5 1/2 NS at 75 ml/hr - f/u CPK levels - monitor renal fx, urine outpt, electrolytes  Acute hypoxic respiratory failure >> improved. Stridor post-extubation >> improving, completed course of decadron. Plan: - bronchial hygiene  Aspiration pneumonia. Plan: - Day 10 of Abx, currently on levaquin >> d/c after dose on 1/10  Dysphagia. Hx of GERD. Plan: - speech therapy to assess - protonix daily  DVT prophylaxis >> lovenox Goals of Care >> full code Disposition >> to medical floor bed.  Will need inpatient psych when medically stable  Updated pt's husband at bedside.  Transfer to Flr bed 1/09 >> will ask [facility] to assume care 1/10 and PCCM sign off.  Coralyn HellingVineet Tiny Chaudhary, MD [facility] 01/07/2016, 11:47 AM Pager:  [phone] After 3pm call: [phone]

## 2016-01-07 NOTE — Evaluation (Signed)
Clinical/Bedside Swallow Evaluation Patient Details  Name: Stacie Sanders MRN: [id number] Date of Birth: Dec 26, 1971  Today's Date: 01/07/2016 Time: SLP Start Time (ACUTE ONLY): 1404 SLP Stop Time (ACUTE ONLY): 1417 SLP Time Calculation (min) (ACUTE ONLY): 13 min  Past Medical History:  Past Medical History  Diagnosis Date  . Depression   . Kidney stone    Past Surgical History:  Past Surgical History  Procedure Laterality Date  . Tubal ligation    . Lithotripsy     HPI:  45 yo female smoker brought to ER after intentional overdose of klonopin, xanax, alcohol, and possibly anti-histamine.Pt was intubated 12/31-1/3, and was reintubated 1/3-1/6. Per chart, husband reports a h/o GERD and states that pt was pending esophageal stretching.   Assessment / Plan / Recommendation Clinical Impression  Pt describes a h/o an esophageal dysphagia, although is also at risk for an acute oropharyngeal dysphagia due to prolonged intubations. At baseline, she describes a sensation of POs, especially pills, that get "stuck" (pointing to her chest), and that she has been recommended to have her esophagus stretched. Presently, she whispers to communicate but has a strong cough and can vocalize much louder when cued to do so.   PO intake was very limited due to pt c/o nausea. Delayed cough was observed x1 while eating ice cream, although cough sounded dry and vocal quality did not appear to change (although difficult to adequately assess due to reduced phonation as described above). Pt is not interested in modifying diet textures at this time to try softer foods. Given the above, recommend to continue with current diet but try crushing pills. SLP to f/u for tolerance.    Aspiration Risk  Mild aspiration risk;Moderate aspiration risk    Diet Recommendation  Regular diet, thin liquids   Medication Administration: Crushed with puree    Other  Recommendations Recommended Consults: Consider esophageal  assessment Oral Care Recommendations: Oral care BID   Follow up Recommendations   (tba)    Frequency and Duration min 2x/week  2 weeks       Prognosis        Swallow Study   General Date of Onset: 12/29/15 HPI: 45 yo female smoker brought to ER after intentional overdose of klonopin, xanax, alcohol, and possibly anti-histamine.Pt was intubated 12/31-1/3, and was reintubated 1/3-1/6. Per chart, husband reports a h/o GERD and states that pt was pending esophageal stretching. Type of Study: Bedside Swallow Evaluation Previous Swallow Assessment: none in chart Diet Prior to this Study: Regular;Thin liquids Temperature Spikes Noted: No Respiratory Status: Room air History of Recent Intubation: Yes Length of Intubations (days): 7 days (across 2 intubations) Date extubated: 01/04/16 Behavior/Cognition: Cooperative;Lethargic/Drowsy;Requires cueing Oral Care Completed by SLP: No Vision: Functional for self-feeding Self-Feeding Abilities: Able to feed self;Needs assist Patient Positioning: Upright in bed Baseline Vocal Quality: Other (comment) (whispers, but can increase volume with cueing(says it hurts)) Volitional Cough: Strong    Oral/Motor/Sensory Function Overall Oral Motor/Sensory Function: Generalized oral weakness   Ice Chips Ice chips: Not tested   Thin Liquid Thin Liquid: Impaired Presentation: Self Fed;Straw Pharyngeal  Phase Impairments: Suspected delayed Swallow    Nectar Thick Nectar Thick Liquid: Not tested   Honey Thick Honey Thick Liquid: Not tested   Puree Puree: Impaired Presentation: Spoon Pharyngeal Phase Impairments: Cough - Delayed   Solid   GO    Solid: Not tested (pt declined)       Maxcine HamLaura Sanders, M.A. CCC-SLP [phone]  [name], [name] 01/07/2016,2:34 PM

## 2016-01-08 LAB — CBC
HEMATOCRIT: 38.9 % (ref 36.0–46.0)
HEMOGLOBIN: 12.5 g/dL (ref 12.0–15.0)
MCH: 31.7 pg (ref 26.0–34.0)
MCHC: 32.1 g/dL (ref 30.0–36.0)
MCV: 98.7 fL (ref 78.0–100.0)
Platelets: 420 10*3/uL — ABNORMAL HIGH (ref 150–400)
RBC: 3.94 MIL/uL (ref 3.87–5.11)
RDW: 13.1 % (ref 11.5–15.5)
WBC: 16.2 10*3/uL — ABNORMAL HIGH (ref 4.0–10.5)

## 2016-01-08 LAB — COMPREHENSIVE METABOLIC PANEL
ALBUMIN: 2.8 g/dL — AB (ref 3.5–5.0)
ALK PHOS: 75 U/L (ref 38–126)
ALT: 308 U/L — ABNORMAL HIGH (ref 14–54)
AST: 560 U/L — AB (ref 15–41)
Anion gap: 11 (ref 5–15)
BUN: 5 mg/dL — AB (ref 6–20)
CO2: 28 mmol/L (ref 22–32)
Calcium: 8.7 mg/dL — ABNORMAL LOW (ref 8.9–10.3)
Chloride: 107 mmol/L (ref 101–111)
Creatinine, Ser: 0.65 mg/dL (ref 0.44–1.00)
GFR calc Af Amer: >60 mL/min (ref >60)
GFR calc non Af Amer: >60 mL/min (ref >60)
GLUCOSE: 163 mg/dL — AB (ref 65–99)
POTASSIUM: 3.7 mmol/L (ref 3.5–5.1)
SODIUM: 146 mmol/L — AB (ref 135–145)
TOTAL PROTEIN: 5.8 g/dL — AB (ref 6.5–8.1)
Total Bilirubin: 0.9 mg/dL (ref 0.3–1.2)

## 2016-01-08 LAB — CK: Total CK: >50000 U/L — ABNORMAL HIGH (ref 38–234)

## 2016-01-08 MED ORDER — NICOTINE 21 MG/24HR TD PT24
21.0000 mg | MEDICATED_PATCH | Freq: Every day | TRANSDERMAL | Status: DC
  Administered 2016-01-08 – 2016-01-13 (×6): 21 mg via TRANSDERMAL
  Filled 2016-01-08 (×6): qty 1

## 2016-01-08 MED ORDER — INFLUENZA VAC SPLIT QUAD 0.5 ML IM SUSY
0.5000 mL | PREFILLED_SYRINGE | INTRAMUSCULAR | Status: DC
  Filled 2016-01-08: qty 0.5

## 2016-01-08 NOTE — Progress Notes (Signed)
Per Joni [facility]Nicole at [facility], patient will be considered for possible admission tomorrow 01/09/16, patient can not be admitted today due medical.  [place] Abtsatia Shandora Koogler, LCSWA Disposition staff 01/08/2016 6:27 PM

## 2016-01-08 NOTE — Progress Notes (Signed)
[facility] PROGRESS NOTE  Andre Lefortracy L Pollick OZH:[id number] DOB: November 09, 1971 DOA: 12/29/2015 PCP: No PCP Per Patient Interim summary: 45 yo female smoker brought to ER after intentional overdose of klonopin, xanax, alcohol, and possibly anti-histamine. She has hx of depression/anxiety and was on seroquel, lithium >> not sure if she was compliant with therapy. She was intubated for airway protection on admission, she was extubated on 1/3 and re intubated for hypoxia.   Assessment/Plan: 1. Acute encephalopathy secondary to intentional drug overdose and non convulsive status epilepticus: - improved and on clonazepam.  - neurology consulted and recommendations given.    2. Rhabdomyolysis: From seizures Continue IV hydration and repeat CK levels in am.    Acute respiratory failure  Resolved.  Completed the course of decadron.    Aspiration pneumonia: Completed the course of antibiotics.    Dysphagia: Resolved.    Hematuria: Renal function is better.  Get UA IN AM Resume IV FLUIDS.    MILD hypernatremia: Monitor on fluids.    Elevated liver function tests: Probably from rhabdomyolysis.  Monitor in am.       Code Status: FULL CODE Family Communication: none at bedside Disposition Plan: pending further work up. Not medically [facility].    Consultants:  Neurology  PCCM  PSYCHIATRY    Procedures:  EEG non convulsive status epilepticus  MRI brain bilateral maxillary sinus fluid  01/03 Extubated >> re-intubated for hypoxia, stridor extubated on 1/6.   Antibiotics: 12/31 Vancomycin >> 1/01 12/31 Zosyn >> 1/01 1/02 Unasyn >> 1/04 1/05 Levaquin >> 1/10  HPI/Subjective: Wants to go home today.   Objective: Filed Vitals:   01/08/16 0626 01/08/16 1342  BP:  128/78  Pulse: 82 92  Temp:  98.4 F (36.9 C)  Resp:  20    Intake/Output Summary (Last 24 hours) at 01/08/16 1421 Last data filed at 01/08/16 1307  Gross per 24  hour  Intake 2111.25 ml  Output   2475 ml  Net -363.75 ml   Filed Weights   01/06/16 0441 01/07/16 0447 01/08/16 0511  Weight: 107.9 kg (237 lb 14 oz) 108.4 kg (238 lb 15.7 oz) 104.8 kg (231 lb 0.7 oz)    Exam:   General:  Alert afebrile comfortable, sitting in the chair  Cardiovascular: s1s2  Respiratory: clear to auscultation, no wheezing or rhonchi  Abdomen: soft NT ND BS+  Musculoskeletal: trace pedal edema.   Data Reviewed: Basic Metabolic Panel:  Recent Labs Lab 01/04/16 0510 01/04/16 [id number] 01/05/16 0536 01/05/16 1738 01/06/16 0445 01/06/16 2037 01/07/16 0450 01/08/16 0520  NA 155* 155* 155* 148* 149* 148* 147* 146*  K 3.4* 3.5 2.9* 2.8* 2.9* 3.2* 3.4* 3.7  CL 122* 122* 116* 109 111 108 110 107  CO2 26 27 29 28 28 28 27 28   GLUCOSE 155* 156* 143* 164* 163* 150* 164* 163*  BUN 16 16 7 7  <5* <5* <5* 5*  CREATININE 0.94 1.00 0.78 0.69 0.72 0.66 0.76 0.65  CALCIUM 8.5* 8.5* 8.6* 8.6* 8.6* 8.7* 8.7* 8.7*  MG 1.9  --  1.9  --  2.1  --  2.2  --   PHOS 1.4* 1.3* 2.4* 2.8 2.2*  --  3.1  --    Liver Function Tests:  Recent Labs Lab 01/04/16 0511 01/05/16 0536 01/06/16 0445 01/07/16 0450 01/08/16 0520  AST  --   --   --   --  560*  ALT  --   --   --   --  308*  ALKPHOS  --   --   --   --  75  BILITOT  --   --   --   --  0.9  PROT  --   --   --   --  5.8*  ALBUMIN 2.2* 2.6* 2.4* 2.7* 2.8*   No results for input(s): LIPASE, AMYLASE in the last 168 hours.  Recent Labs Lab 01/02/16 1047  AMMONIA 42*   CBC:  Recent Labs Lab 01/03/16 0540 01/04/16 0510 01/05/16 0536 01/06/16 0445 01/07/16 0450 01/08/16 0520  WBC 15.3* 11.1* 12.8* 11.8* 11.5* 16.2*  NEUTROABS 11.9* 8.4* 10.8* 9.0* 8.6*  --   HGB 11.7* 10.9* 11.8* 12.5 12.3 12.5  HCT 37.1 35.0* 38.0 38.7 40.0 38.9  MCV 102.2* 100.3* 98.7 97.7 98.8 98.7  PLT 195 192 231 280 356 420*   Cardiac Enzymes:  Recent Labs Lab 01/03/16 1030  01/05/16 0536 01/05/16 1204 01/06/16 0445 01/07/16 0450  01/08/16 0520  CKTOTAL 5168*  < > 30783* 16109* 33221* >50000* >50000*  CKMB 31.4*  --   --   --   --   --   --   < > = values in this interval not displayed. BNP (last 3 results) No results for input(s): BNP in the last 8760 hours.  ProBNP (last 3 results) No results for input(s): PROBNP in the last 8760 hours.  CBG:  Recent Labs Lab 01/06/16 2006 01/06/16 2350 01/07/16 0333 01/07/16 0757 01/07/16 1237  GLUCAP 120* 132* 142* 150* 157*    Recent Results (from the past 240 hour(s))  Culture, blood (routine x 2)     Status: None   Collection Time: 12/31/15  1:15 AM  Result Value Ref Range Status   Specimen Description BLOOD RIGHT ANTECUBITAL  Final   Special Requests IN PEDIATRIC BOTTLE  Final   Culture   Final    NO GROWTH 5 DAYS Performed at [facility]    Report Status 01/06/2016 FINAL  Final  Culture, blood (routine x 2)     Status: None   Collection Time: 12/31/15  1:20 AM  Result Value Ref Range Status   Specimen Description BLOOD R HAND  Final   Special Requests IN PEDIATRIC BOTTLE 1 ML  Final   Culture   Final    NO GROWTH 5 DAYS Performed at [facility]    Report Status 01/06/2016 FINAL  Final  Culture, respiratory (NON-Expectorated)     Status: None   Collection Time: 01/01/16 12:50 AM  Result Value Ref Range Status   Specimen Description TRACHEAL ASPIRATE  Final   Special Requests Normal  Final   Gram Stain   Final    FEW WBC PRESENT, PREDOMINANTLY PMN FEW SQUAMOUS EPITHELIAL CELLS PRESENT NO ORGANISMS SEEN Performed at [facility]    Culture   Final    NORMAL OROPHARYNGEAL FLORA Performed at [facility]    Report Status 01/03/2016 FINAL  Final  Culture, Urine     Status: None   Collection Time: 01/01/16  1:35 AM  Result Value Ref Range Status   Specimen Description URINE, CATHETERIZED  Final   Special Requests Normal  Final   Culture   Final    NO GROWTH 1 DAY Performed at [facility]     Report Status 01/02/2016 FINAL  Final  Culture, blood (Routine X 2) w Reflex to ID Panel     Status: None   Collection Time: 01/02/16  9:30 AM  Result Value Ref Range Status   Specimen Description BLOOD RIGHT ARM  Final   Special Requests IN PEDIATRIC BOTTLE 0.5CC  Final   Culture   Final    NO GROWTH 5 DAYS Performed at [facility]    Report Status 01/07/2016 FINAL  Final  Culture, blood (Routine X 2) w Reflex to ID Panel     Status: None   Collection Time: 01/02/16  9:35 AM  Result Value Ref Range Status   Specimen Description BLOOD LEFT ARM  Final   Special Requests IN PEDIATRIC BOTTLE 1CC  Final   Culture   Final    NO GROWTH 5 DAYS Performed at [facility]    Report Status 01/07/2016 FINAL  Final  Culture, respiratory (NON-Expectorated)     Status: None   Collection Time: 01/02/16  9:40 AM  Result Value Ref Range Status   Specimen Description TRACHEAL ASPIRATE  Final   Special Requests Normal  Final   Gram Stain   Final    ABUNDANT WBC PRESENT, PREDOMINANTLY PMN RARE SQUAMOUS EPITHELIAL CELLS PRESENT NO ORGANISMS SEEN Performed at [facility]    Culture   Final    MODERATE YEAST Performed at [facility]    Report Status 01/04/2016 FINAL  Final  Culture, Urine     Status: None (Preliminary result)   Collection Time: 01/07/16  9:03 PM  Result Value Ref Range Status   Specimen Description URINE, RANDOM  Final   Special Requests Normal  Final   Culture TOO YOUNG TO READ  Final   Report Status PENDING  Incomplete     Studies: No results found.  Scheduled Meds: . clonazePAM  0.5 mg Oral BID  . enoxaparin (LOVENOX) injection  40 mg Subcutaneous Q24H  . [START ON 01/09/2016] Influenza vac split quadrivalent PF  0.5 mL Intramuscular Tomorrow-1000  . levETIRAcetam  1,000 mg Oral BID  . pantoprazole  40 mg Oral BID AC  . polyethylene glycol  17 g Oral Daily   Continuous Infusions: . dextrose 5 % and 0.45% NaCl 75 mL/hr at 01/08/16  1610    Principal Problem:   Drug overdose Active Problems:   Overdose   Acute encephalopathy   Acute respiratory failure (HCC)   Acute respiratory failure with hypoxemia (HCC)    Time spent: 30 minutes.     [facility]  [facility] Pager [phone]  If 7PM-7AM, please contact night-coverage at [email or web address], password [email or web address] 01/08/2016, 2:21 PM  LOS: 10 days

## 2016-01-08 NOTE — Progress Notes (Signed)
Speech Language Pathology Treatment: Dysphagia  Patient Details Name: Andre Lefortracy L Oquin MRN: [id number] DOB: 12-24-1971 Today's Date: 01/08/2016 Time: [id number] SLP Time Calculation (min) (ACUTE ONLY): 18 min  Assessment / Plan / Recommendation Clinical Impression  Pt is more alert today with less c/o sore throat, resulting in greatly improved vocal intensity. Her voice remains raspy, but she reports that this is her baseline. Volitional cough is strong. PO trials of mechanical soft solids and thin liquids are consumed without overt signs of dysphagia or aspiration. Education provided about esophageal precautions with teach back method. Pt and her husband verbalized their understanding. Would continue current diet.   HPI HPI: 45 yo female smoker brought to ER after intentional overdose of klonopin, xanax, alcohol, and possibly anti-histamine.Pt was intubated 12/31-1/3, and was reintubated 1/3-1/6. Per chart, husband reports a h/o GERD and states that pt was pending esophageal stretching.      SLP Plan  Continue with current plan of care     Recommendations  Diet recommendations: Regular;Thin liquid Liquids provided via: Cup;Straw Medication Administration: Crushed with puree Supervision: Patient able to self feed;Intermittent supervision to cue for compensatory strategies Compensations: Slow rate;Small sips/bites;Follow solids with liquid Postural Changes and/or Swallow Maneuvers: Seated upright 90 degrees;Upright 30-60 min after meal              Oral Care Recommendations: Oral care BID Follow up Recommendations: None Plan: Continue with current plan of care   Maxcine HamLaura Paiewonsky, M.A. CCC-SLP [phone]  [name], Ned Kakar 01/08/2016, 11:03 AM

## 2016-01-08 NOTE — Clinical Social Work Psych Note (Signed)
Psych CSW sent referrals to the following facilities:   [facility] expecting dc's will review once bed becomes available [place]- availability- currently under review [facility] [place]- [facility] [place] [place]- [facility] available- [facility] [place] [facility] [place] [place] [employer] [place]- [facility] [place] [place]- at [facility]  Of note: patient urine red in color per RN documentation 1/9.  Likely will need to be addressed in documentation prior to admission to a [facility].  Vickii PennaGina Darlin Stenseth, LCSW [phone]  5N1-9; 2S 15-16 and Hospital Psychiatric Service Line Licensed Clinical Social Worker

## 2016-01-09 LAB — COMPREHENSIVE METABOLIC PANEL
ALBUMIN: 2.6 g/dL — AB (ref 3.5–5.0)
ALT: 275 U/L — ABNORMAL HIGH (ref 14–54)
AST: 388 U/L — AB (ref 15–41)
Alkaline Phosphatase: 96 U/L (ref 38–126)
Anion gap: 8 (ref 5–15)
CALCIUM: 8.6 mg/dL — AB (ref 8.9–10.3)
CO2: 28 mmol/L (ref 22–32)
Chloride: 106 mmol/L (ref 101–111)
Creatinine, Ser: 0.6 mg/dL (ref 0.44–1.00)
GFR calc Af Amer: >60 mL/min (ref >60)
GLUCOSE: 151 mg/dL — AB (ref 65–99)
Potassium: 3.3 mmol/L — ABNORMAL LOW (ref 3.5–5.1)
Sodium: 142 mmol/L (ref 135–145)
TOTAL PROTEIN: 5.4 g/dL — AB (ref 6.5–8.1)
Total Bilirubin: 0.4 mg/dL (ref 0.3–1.2)

## 2016-01-09 LAB — CBC
HCT: 37.4 % (ref 36.0–46.0)
Hemoglobin: 11.9 g/dL — ABNORMAL LOW (ref 12.0–15.0)
MCH: 31.4 pg (ref 26.0–34.0)
MCHC: 31.8 g/dL (ref 30.0–36.0)
MCV: 98.7 fL (ref 78.0–100.0)
PLATELETS: 430 10*3/uL — AB (ref 150–400)
RBC: 3.79 MIL/uL — AB (ref 3.87–5.11)
RDW: 13.1 % (ref 11.5–15.5)
WBC: 14 10*3/uL — AB (ref 4.0–10.5)

## 2016-01-09 LAB — URINE CULTURE: Special Requests: NORMAL

## 2016-01-09 LAB — CK: CK TOTAL: 28935 U/L — AB (ref 38–234)

## 2016-01-09 MED ORDER — GUAIFENESIN-DM 100-10 MG/5ML PO SYRP
5.0000 mL | ORAL_SOLUTION | ORAL | Status: DC | PRN
  Administered 2016-01-09: 5 mL via ORAL
  Filled 2016-01-09: qty 5

## 2016-01-09 MED ORDER — DIPHENHYDRAMINE HCL 25 MG PO CAPS
25.0000 mg | ORAL_CAPSULE | Freq: Once | ORAL | Status: AC
  Administered 2016-01-09: 25 mg via ORAL
  Filled 2016-01-09: qty 1

## 2016-01-09 MED ORDER — POTASSIUM CHLORIDE CRYS ER 20 MEQ PO TBCR
40.0000 meq | EXTENDED_RELEASE_TABLET | Freq: Once | ORAL | Status: AC
Start: 2016-01-09 — End: 2016-01-09
  Administered 2016-01-09: 40 meq via ORAL
  Filled 2016-01-09: qty 2

## 2016-01-09 MED ORDER — SODIUM CHLORIDE 0.9 % IV SOLN
INTRAVENOUS | Status: DC
Start: 2016-01-09 — End: 2016-01-13
  Administered 2016-01-09: 16:00:00 via INTRAVENOUS
  Administered 2016-01-09: 125 mL/h via INTRAVENOUS

## 2016-01-09 MED ORDER — LEVETIRACETAM 100 MG/ML PO SOLN
1000.0000 mg | Freq: Two times a day (BID) | ORAL | Status: DC
  Administered 2016-01-09: 1000 mg via ORAL
  Filled 2016-01-09 (×2): qty 10

## 2016-01-09 MED ORDER — FAMOTIDINE 20 MG PO TABS
20.0000 mg | ORAL_TABLET | Freq: Two times a day (BID) | ORAL | Status: DC | PRN
  Administered 2016-01-09 (×2): 20 mg via ORAL
  Filled 2016-01-09 (×2): qty 1

## 2016-01-09 NOTE — Progress Notes (Signed)
PATIENT DETAILS Name: Stacie Sanders Age: 45 y.o. Sex: female Date of Birth: 1971-06-01 Admit Date: 12/29/2015 Admitting Physician Coralyn Helling, MD PCP:No PCP Per Patient  Subjective: Anxious to be discharged. No longer having dark-colored urine.  Assessment/Plan: Principal Problem: Acute encephalopathy: Secondary to intentional drug overdose. Resolved, completely awake and alert.   Active Problems: Drug overdose:Intentional overdose of Klonopin, Xanax, alcohol and possibly antihistamine. Patient has a history of depression with anxiety. She was managed in the intensive care unit, she was intubated on admission. Subsequently, seen by psychiatry, recommendations for inpatient psychiatric admission on discharge.  Acute hypoxic respiratory failure: Secondary to above. Resolved. Did have brief stridor post extubation, has completed a course of steroids. Now on room air.  Aspiration pneumonia: Completed course of antibiotic. Continue to monitor off antibiotics-doing well-afebrile.  ? Nonconvulsive status epilepticus: Continue Keppra.   Rhabdomyolysis: Secondary to status epilepticus.CK Downtrending, continue IVF  Elevated LFTs: Secondary to above. Follow.   Hypokalemia: Replete and recheck.  GERD: Continue PPI.  Disposition: Remain inpatient  Antimicrobial agents  See below  Anti-infectives    Start     Dose/Rate Route Frequency Ordered Stop   01/03/16 1130  levofloxacin (LEVAQUIN) IVPB 500 mg  Status:  Discontinued     500 mg 100 mL/hr over 60 Minutes Intravenous Every 24 hours 01/03/16 1026 01/07/16 1158   12/31/15 1200  Ampicillin-Sulbactam (UNASYN) 3 g in sodium chloride 0.9 % 100 mL IVPB  Status:  Discontinued     3 g 100 mL/hr over 60 Minutes Intravenous Every 6 hours 12/31/15 1030 01/03/16 1004   12/29/15 1200  piperacillin-tazobactam (ZOSYN) IVPB 3.375 g  Status:  Discontinued     3.375 g 12.5 mL/hr over 240 Minutes Intravenous Every 8 hours  12/29/15 1139 12/31/15 1005   12/29/15 1200  vancomycin (VANCOCIN) IVPB 1000 mg/200 mL premix  Status:  Discontinued     1,000 mg 200 mL/hr over 60 Minutes Intravenous Every 8 hours 12/29/15 1139 12/31/15 1005      DVT Prophylaxis: Prophylactic Lovenox   Code Status: Full code   Family Communication Spouse at bedside  Procedures: None  CONSULTS:  pulmonary/intensive care and psychiatry  Time spent 30 minutes-Greater than 50% of this time was spent in counseling, explanation of diagnosis, planning of further management, and coordination of care.  MEDICATIONS: Scheduled Meds: . clonazePAM  0.5 mg Oral BID  . enoxaparin (LOVENOX) injection  40 mg Subcutaneous Q24H  . Influenza vac split quadrivalent PF  0.5 mL Intramuscular Tomorrow-1000  . levETIRAcetam  1,000 mg Oral BID  . nicotine  21 mg Transdermal Daily  . pantoprazole  40 mg Oral BID AC  . polyethylene glycol  17 g Oral Daily  . potassium chloride  40 mEq Oral Once   Continuous Infusions: . sodium chloride     PRN Meds:.acetaminophen, bisacodyl, clonazePAM, docusate sodium, famotidine, guaiFENesin-dextromethorphan, ipratropium-albuterol, ondansetron (ZOFRAN) IV, oxyCODONE-acetaminophen, sennosides, sodium chloride    PHYSICAL EXAM: Vital signs in last 24 hours: Filed Vitals:   01/08/16 2223 01/09/16 0549 01/09/16 0552 01/09/16 1359  BP: 125/69  129/100 121/87  Pulse: 99  110 114  Temp: 98.2 F (36.8 C)  97.9 F (36.6 C) 99.1 F (37.3 C)  TempSrc: Oral  Oral Oral  Resp: 17  18 18   Height:      Weight:  104.8 kg (231 lb 0.7 oz)    SpO2: 98%  100% 99%  Weight change: 0 kg (0 lb) Filed Weights   01/07/16 0447 01/08/16 0511 01/09/16 0549  Weight: 108.4 kg (238 lb 15.7 oz) 104.8 kg (231 lb 0.7 oz) 104.8 kg (231 lb 0.7 oz)   Body mass index is 35.14 kg/(m^2).   Gen Exam: Awake and alert with clear speech.   Neck: Supple, No JVD.   Chest: B/L Clear.   CVS: S1 S2 Regular, no murmurs.  Abdomen: soft,  BS +, non tender, non distended.  Extremities: no edema, lower extremities warm to touch. Neurologic: Non Focal.   Skin: No Rash.   Wounds: N/A.  Intake/Output from previous day:  Intake/Output Summary (Last 24 hours) at 01/09/16 1524 Last data filed at 01/09/16 0941  Gross per 24 hour  Intake    816 ml  Output   1350 ml  Net   -534 ml     LAB RESULTS: CBC  Recent Labs Lab 01/03/16 0540 01/04/16 0510 01/05/16 0536 01/06/16 0445 01/07/16 0450 01/08/16 0520 01/09/16 0540  WBC 15.3* 11.1* 12.8* 11.8* 11.5* 16.2* 14.0*  HGB 11.7* 10.9* 11.8* 12.5 12.3 12.5 11.9*  HCT 37.1 35.0* 38.0 38.7 40.0 38.9 37.4  PLT 195 192 231 280 356 420* 430*  MCV 102.2* 100.3* 98.7 97.7 98.8 98.7 98.7  MCH 32.2 31.2 30.6 31.6 30.4 31.7 31.4  MCHC 31.5 31.1 31.1 32.3 30.8 32.1 31.8  RDW 13.2 13.5 13.5 13.2 13.2 13.1 13.1  LYMPHSABS 2.5 2.2 1.4 1.9 2.0  --   --   MONOABS 1.0 0.5 0.6 0.6 0.6  --   --   EOSABS 0.0 0.1 0.0 0.4 0.3  --   --   BASOSABS 0.0 0.0 0.0 0.0 0.0  --   --     Chemistries   Recent Labs Lab 01/04/16 0510  01/05/16 0536  01/06/16 0445 01/06/16 2037 01/07/16 0450 01/08/16 0520 01/09/16 0540  NA 155*  < > 155*  < > 149* 148* 147* 146* 142  K 3.4*  < > 2.9*  < > 2.9* 3.2* 3.4* 3.7 3.3*  CL 122*  < > 116*  < > 111 108 110 107 106  CO2 26  < > 29  < > 28 28 27 28 28   GLUCOSE 155*  < > 143*  < > 163* 150* 164* 163* 151*  BUN 16  < > 7  < > <5* <5* <5* 5* <5*  CREATININE 0.94  < > 0.78  < > 0.72 0.66 0.76 0.65 0.60  CALCIUM 8.5*  < > 8.6*  < > 8.6* 8.7* 8.7* 8.7* 8.6*  MG 1.9  --  1.9  --  2.1  --  2.2  --   --   < > = values in this interval not displayed.  CBG:  Recent Labs Lab 01/06/16 2006 01/06/16 2350 01/07/16 0333 01/07/16 0757 01/07/16 1237  GLUCAP 120* 132* 142* 150* 157*    GFR Estimated Creatinine Clearance: 116.1 mL/min (by C-G formula based on Cr of 0.6).  Coagulation profile No results for input(s): INR, PROTIME in the last 168  hours.  Cardiac Enzymes  Recent Labs Lab 01/03/16 1030  CKMB 31.4*    Invalid input(s): POCBNP No results for input(s): DDIMER in the last 72 hours. No results for input(s): HGBA1C in the last 72 hours. No results for input(s): CHOL, HDL, LDLCALC, TRIG, CHOLHDL, LDLDIRECT in the last 72 hours. No results for input(s): TSH, T4TOTAL, T3FREE, THYROIDAB in the last 72 hours.  Invalid input(s): FREET3 No results  for input(s): VITAMINB12, FOLATE, FERRITIN, TIBC, IRON, RETICCTPCT in the last 72 hours. No results for input(s): LIPASE, AMYLASE in the last 72 hours.  Urine Studies No results for input(s): UHGB, CRYS in the last 72 hours.  Invalid input(s): UACOL, UAPR, USPG, UPH, UTP, UGL, UKET, UBIL, UNIT, UROB, ULEU, UEPI, UWBC, URBC, UBAC, CAST, UCOM, BILUA  MICROBIOLOGY: Recent Results (from the past 240 hour(s))  Culture, blood (routine x 2)     Status: None   Collection Time: 12/31/15  1:15 AM  Result Value Ref Range Status   Specimen Description BLOOD RIGHT ANTECUBITAL  Final   Special Requests IN PEDIATRIC BOTTLE  Final   Culture   Final    NO GROWTH 5 DAYS Performed at [facility]    Report Status 01/06/2016 FINAL  Final  Culture, blood (routine x 2)     Status: None   Collection Time: 12/31/15  1:20 AM  Result Value Ref Range Status   Specimen Description BLOOD R HAND  Final   Special Requests IN PEDIATRIC BOTTLE 1 ML  Final   Culture   Final    NO GROWTH 5 DAYS Performed at [facility]    Report Status 01/06/2016 FINAL  Final  Culture, respiratory (NON-Expectorated)     Status: None   Collection Time: 01/01/16 12:50 AM  Result Value Ref Range Status   Specimen Description TRACHEAL ASPIRATE  Final   Special Requests Normal  Final   Gram Stain   Final    FEW WBC PRESENT, PREDOMINANTLY PMN FEW SQUAMOUS EPITHELIAL CELLS PRESENT NO ORGANISMS SEEN Performed at [facility]    Culture   Final    NORMAL OROPHARYNGEAL FLORA Performed at  [facility]    Report Status 01/03/2016 FINAL  Final  Culture, Urine     Status: None   Collection Time: 01/01/16  1:35 AM  Result Value Ref Range Status   Specimen Description URINE, CATHETERIZED  Final   Special Requests Normal  Final   Culture   Final    NO GROWTH 1 DAY Performed at [facility]    Report Status 01/02/2016 FINAL  Final  Culture, blood (Routine X 2) w Reflex to ID Panel     Status: None   Collection Time: 01/02/16  9:30 AM  Result Value Ref Range Status   Specimen Description BLOOD RIGHT ARM  Final   Special Requests IN PEDIATRIC BOTTLE 0.5CC  Final   Culture   Final    NO GROWTH 5 DAYS Performed at [facility]    Report Status 01/07/2016 FINAL  Final  Culture, blood (Routine X 2) w Reflex to ID Panel     Status: None   Collection Time: 01/02/16  9:35 AM  Result Value Ref Range Status   Specimen Description BLOOD LEFT ARM  Final   Special Requests IN PEDIATRIC BOTTLE 1CC  Final   Culture   Final    NO GROWTH 5 DAYS Performed at [facility]    Report Status 01/07/2016 FINAL  Final  Culture, respiratory (NON-Expectorated)     Status: None   Collection Time: 01/02/16  9:40 AM  Result Value Ref Range Status   Specimen Description TRACHEAL ASPIRATE  Final   Special Requests Normal  Final   Gram Stain   Final    ABUNDANT WBC PRESENT, PREDOMINANTLY PMN RARE SQUAMOUS EPITHELIAL CELLS PRESENT NO ORGANISMS SEEN Performed at [facility]   Final  MODERATE YEAST Performed at [facility]    Report Status 01/04/2016 FINAL  Final  Culture, Urine     Status: None   Collection Time: 01/07/16  9:03 PM  Result Value Ref Range Status   Specimen Description URINE, RANDOM  Final   Special Requests Normal  Final   Culture MULTIPLE SPECIES PRESENT, SUGGEST RECOLLECTION  Final   Report Status 01/09/2016 FINAL  Final    RADIOLOGY STUDIES/RESULTS: Dg Abd 1 View  01/01/2016  CLINICAL DATA:  Nasogastric  tube placement.  Initial encounter. EXAM: ABDOMEN - 1 VIEW COMPARISON:  Abdominal radiograph performed 12/29/2015 FINDINGS: The patient's enteric tube is noted ending overlying the body of the stomach. The stomach is largely filled with air. The visualized bowel gas pattern is grossly unremarkable. No free intra-abdominal air is identified, though evaluation for free air is limited on supine views. No acute osseous abnormalities are seen. IMPRESSION: Enteric tube noted ending overlying the body of the stomach. Electronically Signed   By: Roanna Raider M.D.   On: 01/01/2016 01:53   Dg Abd 1 View  12/29/2015  CLINICAL DATA:  OG tube placement. EXAM: ABDOMEN - 1 VIEW COMPARISON:  None. FINDINGS: OG tube tip is in the distal stomach. Bowel gas pattern is normal. No osseous abnormality. IMPRESSION: OG tube tip in good position in the distal stomach. Electronically Signed   By: Francene Boyers M.D.   On: 12/29/2015 10:50   Ct Head Wo Contrast  01/02/2016  CLINICAL DATA:  Status epilepticus. Drug overdose. Fever. Leukocytosis. EXAM: CT HEAD WITHOUT CONTRAST TECHNIQUE: Contiguous axial images were obtained from the base of the skull through the vertex without intravenous contrast. COMPARISON:  None. FINDINGS: No mass lesion. No midline shift. No acute hemorrhage or hematoma. No extra-axial fluid collections. No evidence of acute infarction. Brain parenchyma is normal. There is partial opacification of the paranasal sinuses. This is most likely chronic. Osseous structures are otherwise normal. IMPRESSION: No acute intracranial abnormality.  Chronic sinus disease. Electronically Signed   By: Francene Boyers M.D.   On: 01/02/2016 12:59   Mr Brain Wo Contrast  01/06/2016  CLINICAL DATA:  Acute encephalopathy. Benzodiazepine overdose. Left upper extremity pain and subjective weakness. EXAM: MRI HEAD WITHOUT CONTRAST TECHNIQUE: Multiplanar, multiecho pulse sequences of the brain and surrounding structures were obtained  without intravenous contrast. COMPARISON:  Head CT 01/02/2016 FINDINGS: There is no evidence of acute infarct, intracranial hemorrhage, mass, midline shift, or extra-axial fluid collection. Ventricles and sulci are normal. The brain is normal in signal. Orbits are unremarkable. There is mild circumferential left sphenoid sinus mucosal thickening. Small fluid levels are present in both maxillary sinuses, and there are moderate right and small left mastoid effusions. Major intracranial vascular flow voids are preserved. IMPRESSION: 1. Unremarkable appearance of the brain. 2. Bilateral maxillary sinus fluid. Correlate clinically for acute sinusitis. 3. Right larger than left mastoid effusions. Electronically Signed   By: Sebastian Ache M.D.   On: 01/06/2016 12:49   Dg Chest Port 1 View  01/06/2016  CLINICAL DATA:  45 year old female with respiratory failure EXAM: PORTABLE CHEST 1 VIEW COMPARISON:  Prior chest x-ray 01/05/2016 FINDINGS: Stable position of left upper extremity PICC with the tip overlying the superior cavoatrial junction. Inspiratory volumes are lower today resulting in crowding of the pulmonary vasculature. There is pulmonary venous congestion without overt edema. Mild bibasilar atelectasis. Stable borderline cardiomegaly. No acute osseous abnormality. IMPRESSION: 1. Lower inspiratory volumes with crowding of the pulmonary vasculature and bibasilar atelectasis. 2.  Mild vascular congestion without overt edema. 3. Stable position of left upper extremity PICC. Electronically Signed   By: Malachy Moan M.D.   On: 01/06/2016 08:37   Dg Chest Port 1 View  01/05/2016  CLINICAL DATA:  Patient with respiratory failure. EXAM: PORTABLE CHEST 1 VIEW COMPARISON:  Chest radiograph 01/04/2016. FINDINGS: Left upper extremity PICC line tip projects over the right atrium, unchanged. Interval extubation. Stable cardiac and mediastinal contours. Significant interval improvement and near complete resolution of  previously described bilateral diffuse airspace opacities. No pleural effusion or pneumothorax. IMPRESSION: Marked interval improvement in previously described airspace opacities. Interval extubation. Electronically Signed   By: Annia Belt M.D.   On: 01/05/2016 07:43   Dg Chest Port 1 View  01/04/2016  CLINICAL DATA:  Respiratory failure EXAM: PORTABLE CHEST 1 VIEW COMPARISON:  01/03/2016 FINDINGS: Endotracheal tube in good position. Central venous catheter tip in the right atrium unchanged. NG tube in the stomach with the tip not visualized. Diffuse bilateral airspace disease unchanged.  No effusion. IMPRESSION: Support lines remain in good position No significant change diffuse bilateral airspace disease. Electronically Signed   By: Marlan Palau M.D.   On: 01/04/2016 09:25   Dg Chest Port 1 View  01/03/2016  CLINICAL DATA:  Acute respiratory failure, drug overdose, acute encephalopathy. EXAM: PORTABLE CHEST 1 VIEW COMPARISON:  Portable chest x-ray of January 02, 2016 FINDINGS: The lungs are adequately inflated. The interstitial markings remain increased. Areas of confluent alveolar opacity persist but are slightly less conspicuous. The cardiac silhouette remains enlarged. The central pulmonary vascularity remains engorged. The endotracheal tube tip lies 3 cm above the carina. The esophagogastric tube tip projects below the inferior margin of the image. A left-sided PICC line has been placed since yesterday's study with the tip at or just above the cavoatrial junction. IMPRESSION: Slight interval improvement in the appearance of the pulmonary interstitium but considerable interstitial edema or pneumonia process. Mild cardiomegaly with mild central pulmonary vascular congestion. The support tubes are in reasonable position though the PICC line tip does lie at or just above the cavoatrial junction. Electronically Signed   By: David  [place]and M.D.   On: 01/03/2016 07:32   Dg Chest Port 1 View  01/02/2016   CLINICAL DATA:  Acute respiratory failure, hypoxemia EXAM: PORTABLE CHEST 1 VIEW COMPARISON:  01/01/2016 and 12/31/2015 FINDINGS: Cardiomediastinal silhouette is stable. Endotracheal tube in place with tip 4 cm above the carina. Stable NG tube position. There is mild interstitial prominence bilaterally with subtle patchy airspace opacities. Findings suspicious for mild interstitial edema. Superimposed patchy alveolar edema or infiltrates cannot be excluded. Clinical correlation is necessary. IMPRESSION: There is mild interstitial prominence bilaterally with subtle patchy airspace opacities. Findings suspicious for mild interstitial edema. Superimposed patchy alveolar edema or infiltrates cannot be excluded. Clinical correlation is necessary. Electronically Signed   By: Natasha Mead M.D.   On: 01/02/2016 10:48   Portable Chest Xray  01/01/2016  CLINICAL DATA:  Central line placement and gastric tube placement. Respiratory distress. EXAM: PORTABLE CHEST 1 VIEW COMPARISON:  01/01/2016 FINDINGS: Endotracheal tube remains in stable position. NG tube is in the stomach. No visible central line. No pneumothorax. Heart is borderline in size with vascular congestion. Left lower lobe atelectasis or infiltrate and possible small left effusion. IMPRESSION: Endotracheal tube and NG tube remain in place, unchanged. No visible central line. Vascular congestion. Left lower lobe atelectasis or infiltrate with possible small left effusion. Electronically Signed   By: Charlett Nose M.D.  On: 01/01/2016 12:12   Dg Chest Port 1 View  01/01/2016  CLINICAL DATA:  Respiratory failure, intubated patient, drug overdose, current smoker. EXAM: PORTABLE CHEST 1 VIEW COMPARISON:  Portable chest x-ray of December 31, 2015 FINDINGS: The film is obtained in a reverse lordotic projection. The interstitial markings are more conspicuous bilaterally. The cardiac silhouette is mildly enlarged and the central pulmonary vascularity is engorged. The  endotracheal tube tip lies 4 cm above the carina. The esophagogastric tube tip projects below the inferior margin of the image. IMPRESSION: Patient positioning today may be influencing the appearance of the pulmonary interstitium and cardiac silhouette. The findings suggest mild CHF. Correlation with the patient's clinical and laboratory values is needed. A repeat well-positioned portable chest x-ray may be useful. Electronically Signed   By: David  [place]azilandJordan M.D.   On: 01/01/2016 07:33   Dg Chest Port 1 View  12/31/2015  CLINICAL DATA:  Acute respiratory failure. EXAM: PORTABLE CHEST 1 VIEW COMPARISON:  12/29/2015 FINDINGS: 1023 hours. Endotracheal tube tip is 2.5 cm above the base of the carina. NG tube tip projects over the proximal stomach with proximal port of the tube at the level of the EG junction. There is some minimal basilar atelectasis. No focal airspace consolidation or overt airspace pulmonary edema. The cardiopericardial silhouette is within normal limits for size. The visualized bony structures of the thorax are intact. Telemetry leads overlie the chest. IMPRESSION: No substantial change in exam. Electronically Signed   By: Kennith CenterEric  Mansell M.D.   On: 12/31/2015 10:37   Dg Chest Portable 1 View  12/29/2015  CLINICAL DATA:  Post intubation. EXAM: PORTABLE CHEST 1 VIEW COMPARISON:  Chest radiograph December 29, 2015 at 0132 hours. FINDINGS: Endotracheal tube tip projects 2.2 cm above the carina. Nasogastric tube tip projects in proximal stomach, side port projecting immediately above GE junction. New RIGHT middle lobe consolidation and volume loss, LEFT lung base bandlike density. Stable mild cardiomegaly and pulmonary vascular congestion. No pneumothorax. Soft tissue planes and included osseous structures are nonsuspicious. IMPRESSION: Endotracheal tube tip projects 2.2 cm above the carina, nasogastric tube in proximal stomach, side port immediately above the GE junction. Mild cardiomegaly and  pulmonary vascular congestion. New RIGHT middle lobe, LEFT lung base consolidation may represent atelectasis or pneumonia, less likely confluent edema. Electronically Signed   By: Awilda Metroourtnay  Bloomer M.D.   On: 12/29/2015 05:02   Dg Chest Portable 1 View  12/29/2015  CLINICAL DATA:  [age] year old female with altered mental status EXAM: PORTABLE CHEST 1 VIEW COMPARISON:  Radiograph dated 11/27/2015 FINDINGS: Single-view of the chest demonstrates mild cardiomegaly with diffuse interstitial and vascular prominence likely representing a degree of congestive changes. There is no focal consolidation, pleural effusion, or pneumothorax. The osseous structures appear unremarkable. IMPRESSION: Cardiomegaly with mild congestive changes.  No focal consolidation. Electronically Signed   By: Elgie CollardArash  Radparvar M.D.   On: 12/29/2015 02:09   Dg Abd Portable 1v  01/02/2016  CLINICAL DATA:  Ileus, distended abdomen EXAM: PORTABLE ABDOMEN - 1 VIEW COMPARISON:  01/01/2016 FINDINGS: There is normal small bowel gas pattern. NG tube in place with tip in distal stomach. Moderate gaseous distension of the right colon and transverse colon. IMPRESSION: Normal small bowel gas pattern. Moderate gaseous distension of the right and transverse colon. NG tube in place. Electronically Signed   By: Natasha MeadLiviu  Pop M.D.   On: 01/02/2016 10:49   Dg Abd Portable 1v  01/01/2016  CLINICAL DATA:  Nasogastric tube placement EXAM: PORTABLE ABDOMEN - 1  VIEW COMPARISON:  Study obtained earlier in the day. FINDINGS: Nasogastric tube tip and side port in stomach. Stomach is no longer distended. Bowel gas pattern is unremarkable. No obstruction or free air. IMPRESSION: Nasogastric tube tip and side port in stomach. Bowel gas pattern unremarkable. Electronically Signed   By: Bretta Bang III M.D.   On: 01/01/2016 12:12    Jeoffrey Massed, MD  [facility] Pager:[phone]  If 7PM-7AM, please contact night-coverage [email or web address] Password  [email or web address] 01/09/2016, 3:24 PM   LOS: 11 days

## 2016-01-09 NOTE — Clinical Social Work Psych Note (Signed)
Psych CSW [facility] that patient has a bed available.  Patient is not medically cleared at this time.  [place] notified.  Vickii PennaGina Mitsuko Luera, LCSW [phone]  [id number]; 2S 15-16 and Hospital Psychiatric Service Line Licensed Clinical Social Worker

## 2016-01-10 LAB — COMPREHENSIVE METABOLIC PANEL
ALK PHOS: 68 U/L (ref 38–126)
ALT: 236 U/L — AB (ref 14–54)
ANION GAP: 9 (ref 5–15)
AST: 241 U/L — ABNORMAL HIGH (ref 15–41)
Albumin: 2.8 g/dL — ABNORMAL LOW (ref 3.5–5.0)
BUN: 6 mg/dL (ref 6–20)
CALCIUM: 8.8 mg/dL — AB (ref 8.9–10.3)
CO2: 27 mmol/L (ref 22–32)
CREATININE: 0.61 mg/dL (ref 0.44–1.00)
Chloride: 109 mmol/L (ref 101–111)
Glucose, Bld: 134 mg/dL — ABNORMAL HIGH (ref 65–99)
Potassium: 3.5 mmol/L (ref 3.5–5.1)
SODIUM: 145 mmol/L (ref 135–145)
Total Bilirubin: 0.6 mg/dL (ref 0.3–1.2)
Total Protein: 5.6 g/dL — ABNORMAL LOW (ref 6.5–8.1)

## 2016-01-10 LAB — CK: Total CK: 13835 U/L — ABNORMAL HIGH (ref 38–234)

## 2016-01-10 MED ORDER — FAMOTIDINE 20 MG PO TABS
20.0000 mg | ORAL_TABLET | Freq: Two times a day (BID) | ORAL | Status: DC
  Administered 2016-01-10 – 2016-01-13 (×7): 20 mg via ORAL
  Filled 2016-01-10 (×7): qty 1

## 2016-01-10 MED ORDER — ONDANSETRON HCL 4 MG/2ML IJ SOLN
4.0000 mg | Freq: Four times a day (QID) | INTRAMUSCULAR | Status: DC
  Administered 2016-01-10 – 2016-01-12 (×7): 4 mg via INTRAVENOUS
  Filled 2016-01-10 (×9): qty 2

## 2016-01-10 MED ORDER — DIPHENHYDRAMINE HCL 25 MG PO CAPS
25.0000 mg | ORAL_CAPSULE | Freq: Four times a day (QID) | ORAL | Status: DC | PRN
  Administered 2016-01-10 – 2016-01-12 (×6): 25 mg via ORAL
  Filled 2016-01-10 (×6): qty 1

## 2016-01-10 MED ORDER — BOOST / RESOURCE BREEZE PO LIQD
1.0000 | Freq: Three times a day (TID) | ORAL | Status: DC
  Administered 2016-01-10 – 2016-01-13 (×5): 1 via ORAL

## 2016-01-10 MED ORDER — ONDANSETRON HCL 4 MG PO TABS
4.0000 mg | ORAL_TABLET | Freq: Four times a day (QID) | ORAL | Status: DC | PRN
  Administered 2016-01-10: 4 mg via ORAL
  Filled 2016-01-10: qty 1

## 2016-01-10 NOTE — Progress Notes (Signed)
[facility] notified that has rash. This rash is spreading to all over the body pt states that it is painful new order for benadryl 25 mg po was given will continue to monitor. Ilean SkillVeronica Danica Camarena LPN

## 2016-01-10 NOTE — Progress Notes (Signed)
PATIENT DETAILS Name: Stacie Sanders Age: 45 y.o. Sex: female Date of Birth: 13-Aug-1971 Admit Date: 12/29/2015 Admitting Physician Coralyn Helling, MD PCP:No PCP Per Patient  Subjective: Urine remains yellow/clear colored.not dark colored.  c  Assessment/Plan: Principal Problem: Acute encephalopathy: Secondary to intentional drug overdose. Resolved, completely awake and alert.   Active Problems: Drug overdose:Intentional overdose of Klonopin, Xanax, alcohol and possibly antihistamine. Patient has a history of depression with anxiety. She was managed in the intensive care unit, she was intubated on admission. Subsequently, seen by psychiatry, recommendations for inpatient psychiatric admission on discharge.  Acute hypoxic respiratory failure: Secondary to above. Resolved. Did have brief stridor post extubation, has completed a course of steroids. Now on room air.  Aspiration pneumonia: Completed course of antibiotic. Continue to monitor off antibiotics-doing well-afebrile.  ? Nonconvulsive status epilepticus: Was started on Keppra on admission-spoke with Dr. Angela Nevin on call on 1/11-case discussed over the phone-he advised to discontinue Keppra and monitor.    Rhabdomyolysis: Secondary to status epilepticus.CK Downtrending-have discontinued IV fluids. Encouraged hydration orally.  Elevated LFTs: Secondary to above. Follow.   Hypokalemia: Repleted   GERD: Continue PPI.  Disposition: Remain inpatient-if CK continues to down trend off IV fluids-should be stable for discharge tomorrow.   Antimicrobial agents  See below  Anti-infectives    Start     Dose/Rate Route Frequency Ordered Stop   01/03/16 1130  levofloxacin (LEVAQUIN) IVPB 500 mg  Status:  Discontinued     500 mg 100 mL/hr over 60 Minutes Intravenous Every 24 hours 01/03/16 1026 01/07/16 1158   12/31/15 1200  Ampicillin-Sulbactam (UNASYN) 3 g in sodium chloride 0.9 % 100 mL IVPB  Status:   Discontinued     3 g 100 mL/hr over 60 Minutes Intravenous Every 6 hours 12/31/15 1030 01/03/16 1004   12/29/15 1200  piperacillin-tazobactam (ZOSYN) IVPB 3.375 g  Status:  Discontinued     3.375 g 12.5 mL/hr over 240 Minutes Intravenous Every 8 hours 12/29/15 1139 12/31/15 1005   12/29/15 1200  vancomycin (VANCOCIN) IVPB 1000 mg/200 mL premix  Status:  Discontinued     1,000 mg 200 mL/hr over 60 Minutes Intravenous Every 8 hours 12/29/15 1139 12/31/15 1005      DVT Prophylaxis: Prophylactic Lovenox   Code Status: Full code   Family Communication Spouse at bedside  Procedures: None  CONSULTS:  pulmonary/intensive care and psychiatry  Time spent 25 minutes-Greater than 50% of this time was spent in counseling, explanation of diagnosis, planning of further management, and coordination of care.  MEDICATIONS: Scheduled Meds: . clonazePAM  0.5 mg Oral BID  . enoxaparin (LOVENOX) injection  40 mg Subcutaneous Q24H  . famotidine  20 mg Oral BID  . Influenza vac split quadrivalent PF  0.5 mL Intramuscular Tomorrow-1000  . nicotine  21 mg Transdermal Daily  . pantoprazole  40 mg Oral BID AC  . polyethylene glycol  17 g Oral Daily   Continuous Infusions: . sodium chloride 10 mL/hr at 01/10/16 0847   PRN Meds:.acetaminophen, bisacodyl, clonazePAM, diphenhydrAMINE, docusate sodium, guaiFENesin-dextromethorphan, ipratropium-albuterol, ondansetron (ZOFRAN) IV, oxyCODONE-acetaminophen, sennosides, sodium chloride    PHYSICAL EXAM: Vital signs in last 24 hours: Filed Vitals:   01/09/16 1359 01/09/16 1807 01/09/16 2157 01/10/16 0517  BP: 121/87 134/79 131/78 119/76  Pulse: 114 98 104 91  Temp: 99.1 F (37.3 C) 98.4 F (36.9 C) 98.2 F (36.8 C) 98 F (36.7 C)  TempSrc:  Oral Oral Oral Oral  Resp: 18 18 18 19   Height:      Weight:      SpO2: 99% 99% 100% 98%    Weight change:  Filed Weights   01/07/16 0447 01/08/16 0511 01/09/16 0549  Weight: 108.4 kg (238 lb 15.7 oz)  104.8 kg (231 lb 0.7 oz) 104.8 kg (231 lb 0.7 oz)   Body mass index is 35.14 kg/(m^2).   Gen Exam: Awake and alert with clear speech.   Neck: Supple, No JVD.   Chest: B/L Clear.   CVS: S1 S2 Regular, no murmurs.  Abdomen: soft, BS +, non tender, non distended.  Extremities: no edema, lower extremities warm to touch. Neurologic: Non Focal.   Skin: No Rash.   Wounds: N/A.  Intake/Output from previous day:  Intake/Output Summary (Last 24 hours) at 01/10/16 1325 Last data filed at 01/10/16 0700  Gross per 24 hour  Intake 1829.17 ml  Output      0 ml  Net 1829.17 ml     LAB RESULTS: CBC  Recent Labs Lab 01/04/16 0510 01/05/16 0536 01/06/16 0445 01/07/16 0450 01/08/16 0520 01/09/16 0540  WBC 11.1* 12.8* 11.8* 11.5* 16.2* 14.0*  HGB 10.9* 11.8* 12.5 12.3 12.5 11.9*  HCT 35.0* 38.0 38.7 40.0 38.9 37.4  PLT 192 231 280 356 420* 430*  MCV 100.3* 98.7 97.7 98.8 98.7 98.7  MCH 31.2 30.6 31.6 30.4 31.7 31.4  MCHC 31.1 31.1 32.3 30.8 32.1 31.8  RDW 13.5 13.5 13.2 13.2 13.1 13.1  LYMPHSABS 2.2 1.4 1.9 2.0  --   --   MONOABS 0.5 0.6 0.6 0.6  --   --   EOSABS 0.1 0.0 0.4 0.3  --   --   BASOSABS 0.0 0.0 0.0 0.0  --   --     Chemistries   Recent Labs Lab 01/04/16 0510  01/05/16 0536  01/06/16 0445 01/06/16 2037 01/07/16 0450 01/08/16 0520 01/09/16 0540 01/10/16 0500  NA 155*  < > 155*  < > 149* 148* 147* 146* 142 145  K 3.4*  < > 2.9*  < > 2.9* 3.2* 3.4* 3.7 3.3* 3.5  CL 122*  < > 116*  < > 111 108 110 107 106 109  CO2 26  < > 29  < > 28 28 27 28 28 27   GLUCOSE 155*  < > 143*  < > 163* 150* 164* 163* 151* 134*  BUN 16  < > 7  < > <5* <5* <5* 5* <5* 6  CREATININE 0.94  < > 0.78  < > 0.72 0.66 0.76 0.65 0.60 0.61  CALCIUM 8.5*  < > 8.6*  < > 8.6* 8.7* 8.7* 8.7* 8.6* 8.8*  MG 1.9  --  1.9  --  2.1  --  2.2  --   --   --   < > = values in this interval not displayed.  CBG:  Recent Labs Lab 01/06/16 2006 01/06/16 2350 01/07/16 0333 01/07/16 0757 01/07/16 1237    GLUCAP 120* 132* 142* 150* 157*    GFR Estimated Creatinine Clearance: 116.1 mL/min (by C-G formula based on Cr of 0.61).  Coagulation profile No results for input(s): INR, PROTIME in the last 168 hours.  Cardiac Enzymes No results for input(s): CKMB, TROPONINI, MYOGLOBIN in the last 168 hours.  Invalid input(s): CK  Invalid input(s): POCBNP No results for input(s): DDIMER in the last 72 hours. No results for input(s): HGBA1C in the last 72 hours. No results for  input(s): CHOL, HDL, LDLCALC, TRIG, CHOLHDL, LDLDIRECT in the last 72 hours. No results for input(s): TSH, T4TOTAL, T3FREE, THYROIDAB in the last 72 hours.  Invalid input(s): FREET3 No results for input(s): VITAMINB12, FOLATE, FERRITIN, TIBC, IRON, RETICCTPCT in the last 72 hours. No results for input(s): LIPASE, AMYLASE in the last 72 hours.  Urine Studies No results for input(s): UHGB, CRYS in the last 72 hours.  Invalid input(s): UACOL, UAPR, USPG, UPH, UTP, UGL, UKET, UBIL, UNIT, UROB, ULEU, UEPI, UWBC, URBC, UBAC, CAST, UCOM, BILUA  MICROBIOLOGY: Recent Results (from the past 240 hour(s))  Culture, respiratory (NON-Expectorated)     Status: None   Collection Time: 01/01/16 12:50 AM  Result Value Ref Range Status   Specimen Description TRACHEAL ASPIRATE  Final   Special Requests Normal  Final   Gram Stain   Final    FEW WBC PRESENT, PREDOMINANTLY PMN FEW SQUAMOUS EPITHELIAL CELLS PRESENT NO ORGANISMS SEEN Performed at [facility]    Culture   Final    NORMAL OROPHARYNGEAL FLORA Performed at [facility]    Report Status 01/03/2016 FINAL  Final  Culture, Urine     Status: None   Collection Time: 01/01/16  1:35 AM  Result Value Ref Range Status   Specimen Description URINE, CATHETERIZED  Final   Special Requests Normal  Final   Culture   Final    NO GROWTH 1 DAY [facility]formed [facility]    Report Status 01/02/2016 FINAL  Final  Culture, blood (Routine X 2) w Reflex to ID  Panel     Status: None   Collection Time: 01/02/16  9:30 AM  Result Value Ref Range Status   Specimen Description BLOOD RIGHT ARM  Final   Special Requests IN PEDIATRIC BOTTLE 0.5CC  Final   Culture   Final    NO GROWTH 5 DAYS Performed at [facility]    Report Status 01/07/2016 FINAL  Final  Culture, blood (Routine X 2) w Reflex to ID Panel     Status: None   Collection Time: 01/02/16  9:35 AM  Result Value Ref Range Status   Specimen Description BLOOD LEFT ARM  Final   Special Requests IN PEDIATRIC BOTTLE 1CC  Final   Culture   Final    NO GROWTH 5 DAYS Performed at [facility]    Report Status 01/07/2016 FINAL  Final  Culture, respiratory (NON-Expectorated)     Status: None   Collection Time: 01/02/16  9:40 AM  Result Value Ref Range Status   Specimen Description TRACHEAL ASPIRATE  Final   Special Requests Normal  Final   Gram Stain   Final    ABUNDANT WBC PRESENT, PREDOMINANTLY PMN RARE SQUAMOUS EPITHELIAL CELLS PRESENT NO ORGANISMS SEEN Performed at [facility]    Culture   Final    MODERATE YEAST Performed at [facility]    Report Status 01/04/2016 FINAL  Final  Culture, Urine     Status: None   Collection Time: 01/07/16  9:03 PM  Result Value Ref Range Status   Specimen Description URINE, RANDOM  Final   Special Requests Normal  Final   Culture MULTIPLE SPECIES PRESENT, SUGGEST RECOLLECTION  Final   Report Status 01/09/2016 FINAL  Final    RADIOLOGY STUDIES/RESULTS: Dg Abd 1 View  01/01/2016  CLINICAL DATA:  Nasogastric tube placement.  Initial encounter. EXAM: ABDOMEN - 1 VIEW COMPARISON:  Abdominal radiograph performed 12/29/2015 FINDINGS: The patient's enteric tube is noted  ending overlying the body of the stomach. The stomach is largely filled with air. The visualized bowel gas pattern is grossly unremarkable. No free intra-abdominal air is identified, though evaluation for free air is limited on supine views. No acute osseous  abnormalities are seen. IMPRESSION: Enteric tube noted ending overlying the body of the stomach. Electronically Signed   By: Roanna Raider M.D.   On: 01/01/2016 01:53   Dg Abd 1 View  12/29/2015  CLINICAL DATA:  OG tube placement. EXAM: ABDOMEN - 1 VIEW COMPARISON:  None. FINDINGS: OG tube tip is in the distal stomach. Bowel gas pattern is normal. No osseous abnormality. IMPRESSION: OG tube tip in good position in the distal stomach. Electronically Signed   By: Francene Boyers M.D.   On: 12/29/2015 10:50   Ct Head Wo Contrast  01/02/2016  CLINICAL DATA:  Status epilepticus. Drug overdose. Fever. Leukocytosis. EXAM: CT HEAD WITHOUT CONTRAST TECHNIQUE: Contiguous axial images were obtained from the base of the skull through the vertex without intravenous contrast. COMPARISON:  None. FINDINGS: No mass lesion. No midline shift. No acute hemorrhage or hematoma. No extra-axial fluid collections. No evidence of acute infarction. Brain parenchyma is normal. There is partial opacification of the paranasal sinuses. This is most likely chronic. Osseous structures are otherwise normal. IMPRESSION: No acute intracranial abnormality.  Chronic sinus disease. Electronically Signed   By: Francene Boyers M.D.   On: 01/02/2016 12:59   Mr Brain Wo Contrast  01/06/2016  CLINICAL DATA:  Acute encephalopathy. Benzodiazepine overdose. Left upper extremity pain and subjective weakness. EXAM: MRI HEAD WITHOUT CONTRAST TECHNIQUE: Multiplanar, multiecho pulse sequences of the brain and surrounding structures were obtained without intravenous contrast. COMPARISON:  Head CT 01/02/2016 FINDINGS: There is no evidence of acute infarct, intracranial hemorrhage, mass, midline shift, or extra-axial fluid collection. Ventricles and sulci are normal. The brain is normal in signal. Orbits are unremarkable. There is mild circumferential left sphenoid sinus mucosal thickening. Small fluid levels are present in both maxillary sinuses, and there are  moderate right and small left mastoid effusions. Major intracranial vascular flow voids are preserved. IMPRESSION: 1. Unremarkable appearance of the brain. 2. Bilateral maxillary sinus fluid. Correlate clinically for acute sinusitis. 3. Right larger than left mastoid effusions. Electronically Signed   By: Sebastian Ache M.D.   On: 01/06/2016 12:49   Dg Chest Port 1 View  01/06/2016  CLINICAL DATA:  45 year old female with respiratory failure EXAM: PORTABLE CHEST 1 VIEW COMPARISON:  Prior chest x-ray 01/05/2016 FINDINGS: Stable position of left upper extremity PICC with the tip overlying the superior cavoatrial junction. Inspiratory volumes are lower today resulting in crowding of the pulmonary vasculature. There is pulmonary venous congestion without overt edema. Mild bibasilar atelectasis. Stable borderline cardiomegaly. No acute osseous abnormality. IMPRESSION: 1. Lower inspiratory volumes with crowding of the pulmonary vasculature and bibasilar atelectasis. 2. Mild vascular congestion without overt edema. 3. Stable position of left upper extremity PICC. Electronically Signed   By: Malachy Moan M.D.   On: 01/06/2016 08:37   Dg Chest Port 1 View  01/05/2016  CLINICAL DATA:  Patient with respiratory failure. EXAM: PORTABLE CHEST 1 VIEW COMPARISON:  Chest radiograph 01/04/2016. FINDINGS: Left upper extremity PICC line tip projects over the right atrium, unchanged. Interval extubation. Stable cardiac and mediastinal contours. Significant interval improvement and near complete resolution of previously described bilateral diffuse airspace opacities. No pleural effusion or pneumothorax. IMPRESSION: Marked interval improvement in previously described airspace opacities. Interval extubation. Electronically Signed   By: Kenard Gower  Earlene Plater M.D.   On: 01/05/2016 07:43   Dg Chest Port 1 View  01/04/2016  CLINICAL DATA:  Respiratory failure EXAM: PORTABLE CHEST 1 VIEW COMPARISON:  01/03/2016 FINDINGS: Endotracheal tube in  good position. Central venous catheter tip in the right atrium unchanged. NG tube in the stomach with the tip not visualized. Diffuse bilateral airspace disease unchanged.  No effusion. IMPRESSION: Support lines remain in good position No significant change diffuse bilateral airspace disease. Electronically Signed   By: Marlan Palau M.D.   On: 01/04/2016 09:25   Dg Chest Port 1 View  01/03/2016  CLINICAL DATA:  Acute respiratory failure, drug overdose, acute encephalopathy. EXAM: PORTABLE CHEST 1 VIEW COMPARISON:  Portable chest x-ray of January 02, 2016 FINDINGS: The lungs are adequately inflated. The interstitial markings remain increased. Areas of confluent alveolar opacity persist but are slightly less conspicuous. The cardiac silhouette remains enlarged. The central pulmonary vascularity remains engorged. The endotracheal tube tip lies 3 cm above the carina. The esophagogastric tube tip projects below the inferior margin of the image. A left-sided PICC line has been placed since yesterday's study with the tip at or just above the cavoatrial junction. IMPRESSION: Slight interval improvement in the appearance of the pulmonary interstitium but considerable interstitial edema or pneumonia process. Mild cardiomegaly with mild central pulmonary vascular congestion. The support tubes are in reasonable position though the PICC line tip does lie at or just above the cavoatrial junction. Electronically Signed   By: David  [place] M.D.   On: 01/03/2016 07:32   Dg Chest Port 1 View  01/02/2016  CLINICAL DATA:  Acute respiratory failure, hypoxemia EXAM: PORTABLE CHEST 1 VIEW COMPARISON:  01/01/2016 and 12/31/2015 FINDINGS: Cardiomediastinal silhouette is stable. Endotracheal tube in place with tip 4 cm above the carina. Stable NG tube position. There is mild interstitial prominence bilaterally with subtle patchy airspace opacities. Findings suspicious for mild interstitial edema. Superimposed patchy alveolar edema or  infiltrates cannot be excluded. Clinical correlation is necessary. IMPRESSION: There is mild interstitial prominence bilaterally with subtle patchy airspace opacities. Findings suspicious for mild interstitial edema. Superimposed patchy alveolar edema or infiltrates cannot be excluded. Clinical correlation is necessary. Electronically Signed   By: Natasha Mead M.D.   On: 01/02/2016 10:48   Portable Chest Xray  01/01/2016  CLINICAL DATA:  Central line placement and gastric tube placement. Respiratory distress. EXAM: PORTABLE CHEST 1 VIEW COMPARISON:  01/01/2016 FINDINGS: Endotracheal tube remains in stable position. NG tube is in the stomach. No visible central line. No pneumothorax. Heart is borderline in size with vascular congestion. Left lower lobe atelectasis or infiltrate and possible small left effusion. IMPRESSION: Endotracheal tube and NG tube remain in place, unchanged. No visible central line. Vascular congestion. Left lower lobe atelectasis or infiltrate with possible small left effusion. Electronically Signed   By: Charlett Nose M.D.   On: 01/01/2016 12:12   Dg Chest Port 1 View  01/01/2016  CLINICAL DATA:  Respiratory failure, intubated patient, drug overdose, current smoker. EXAM: PORTABLE CHEST 1 VIEW COMPARISON:  Portable chest x-ray of December 31, 2015 FINDINGS: The film is obtained in a reverse lordotic projection. The interstitial markings are more conspicuous bilaterally. The cardiac silhouette is mildly enlarged and the central pulmonary vascularity is engorged. The endotracheal tube tip lies 4 cm above the carina. The esophagogastric tube tip projects below the inferior margin of the image. IMPRESSION: Patient positioning today may be influencing the appearance of the pulmonary interstitium and cardiac silhouette. The findings suggest mild CHF.  Correlation with the patient's clinical and laboratory values is needed. A repeat well-positioned portable chest x-ray may be useful. Electronically  Signed   By: David  [place] M.D.   On: 01/01/2016 07:33   Dg Chest Port 1 View  12/31/2015  CLINICAL DATA:  Acute respiratory failure. EXAM: PORTABLE CHEST 1 VIEW COMPARISON:  12/29/2015 FINDINGS: 1023 hours. Endotracheal tube tip is 2.5 cm above the base of the carina. NG tube tip projects over the proximal stomach with proximal port of the tube at the level of the EG junction. There is some minimal basilar atelectasis. No focal airspace consolidation or overt airspace pulmonary edema. The cardiopericardial silhouette is within normal limits for size. The visualized bony structures of the thorax are intact. Telemetry leads overlie the chest. IMPRESSION: No substantial change in exam. Electronically Signed   By: Kennith Center M.D.   On: 12/31/2015 10:37   Dg Chest Portable 1 View  12/29/2015  CLINICAL DATA:  Post intubation. EXAM: PORTABLE CHEST 1 VIEW COMPARISON:  Chest radiograph December 29, 2015 at 0132 hours. FINDINGS: Endotracheal tube tip projects 2.2 cm above the carina. Nasogastric tube tip projects in proximal stomach, side port projecting immediately above GE junction. New RIGHT middle lobe consolidation and volume loss, LEFT lung base bandlike density. Stable mild cardiomegaly and pulmonary vascular congestion. No pneumothorax. Soft tissue planes and included osseous structures are nonsuspicious. IMPRESSION: Endotracheal tube tip projects 2.2 cm above the carina, nasogastric tube in proximal stomach, side port immediately above the GE junction. Mild cardiomegaly and pulmonary vascular congestion. New RIGHT middle lobe, LEFT lung base consolidation may represent atelectasis or pneumonia, less likely confluent edema. Electronically Signed   By: Awilda Metro M.D.   On: 12/29/2015 05:02   Dg Chest Portable 1 View  12/29/2015  CLINICAL DATA:  45 year old female with altered mental status EXAM: PORTABLE CHEST 1 VIEW COMPARISON:  Radiograph dated 11/27/2015 FINDINGS: Single-view of the chest  demonstrates mild cardiomegaly with diffuse interstitial and vascular prominence likely representing a degree of congestive changes. There is no focal consolidation, pleural effusion, or pneumothorax. The osseous structures appear unremarkable. IMPRESSION: Cardiomegaly with mild congestive changes.  No focal consolidation. Electronically Signed   By: Elgie Collard M.D.   On: 12/29/2015 02:09   Dg Abd Portable 1v  01/02/2016  CLINICAL DATA:  Ileus, distended abdomen EXAM: PORTABLE ABDOMEN - 1 VIEW COMPARISON:  01/01/2016 FINDINGS: There is normal small bowel gas pattern. NG tube in place with tip in distal stomach. Moderate gaseous distension of the right colon and transverse colon. IMPRESSION: Normal small bowel gas pattern. Moderate gaseous distension of the right and transverse colon. NG tube in place. Electronically Signed   By: Natasha Mead M.D.   On: 01/02/2016 10:49   Dg Abd Portable 1v  01/01/2016  CLINICAL DATA:  Nasogastric tube placement EXAM: PORTABLE ABDOMEN - 1 VIEW COMPARISON:  Study obtained earlier in the day. FINDINGS: Nasogastric tube tip and side port in stomach. Stomach is no longer distended. Bowel gas pattern is unremarkable. No obstruction or free air. IMPRESSION: Nasogastric tube tip and side port in stomach. Bowel gas pattern unremarkable. Electronically Signed   By: Bretta Bang III M.D.   On: 01/01/2016 12:12    Jeoffrey Massed, MD  [facility] Pager:[phone]  If 7PM-7AM, please contact night-coverage [email or web address] Password [email or web address] 01/10/2016, 1:25 PM   LOS: 12 days

## 2016-01-10 NOTE — Progress Notes (Signed)
Speech Language Pathology Treatment: Dysphagia  Patient Details Name: Stacie Sanders MRN: [id number] DOB: 05-Sep-1971 Today's Date: 01/10/2016 Time: 7673-4193 SLP Time Calculation (min) (ACUTE ONLY): 14 min  Assessment / Plan / Recommendation Clinical Impression  Pt with c/o nausea but drinking thin liquids via cup upon SLP arrival. No overt s/s of aspiration or dysphagia noted, although she continues to report difficulties with getting solids down. Additional thin liquids consumed via straw, still without difficulty. Suspect that pt has returned to her baseline swallowing function, which does include esophageal issues per pt/family report. She is in agreement. Therefore, no further acute SLP intervention warranted. Pt may need to f/u with her GI MD if she has further concerns about her esophageal function.   HPI HPI: 45 yo female smoker brought to ER after intentional overdose of klonopin, xanax, alcohol, and possibly anti-histamine.Pt was intubated 12/31-1/3, and was reintubated 1/3-1/6. Per chart, husband reports a h/o GERD and states that pt was pending esophageal stretching.      SLP Plan  All goals met     Recommendations  Diet recommendations: Dysphagia 3 (mechanical soft);Thin liquid Liquids provided via: Cup;Straw Medication Administration: Crushed with puree Supervision: Patient able to self feed;Intermittent supervision to cue for compensatory strategies Compensations: Slow rate;Small sips/bites;Follow solids with liquid Postural Changes and/or Swallow Maneuvers: Seated upright 90 degrees;Upright 30-60 min after meal              Oral Care Recommendations: Oral care BID Follow up Recommendations: None Plan: All goals met   Germain Osgood, M.A. CCC-SLP [phone]  Germain Osgood 01/10/2016, 10:25 AM

## 2016-01-10 NOTE — Clinical Social Work Psych Note (Addendum)
Patient projected to be medically stable 01/11/2016.  Psych CSW to continue to follow.  Vickii PennaGina Chantilly Linskey, LCSW [phone]  5N1-9; 2S 15-16 and Hospital [facility] Service Line Licensed Clinical Social Worker

## 2016-01-10 NOTE — Progress Notes (Signed)
Nutrition Follow-up  DOCUMENTATION CODES:   Obesity unspecified  INTERVENTION:   -Boost Breeze po TID, each supplement provides 250 kcal and 9 grams of protein  NUTRITION DIAGNOSIS:   Inadequate oral intake related to altered GI function as evidenced by meal completion < 50%.  Ongoing  GOAL:   Patient will meet greater than or equal to 90% of their needs  Unmet  MONITOR:   PO intake, Supplement acceptance, Labs, I & O's  REASON FOR ASSESSMENT:   Consult Enteral/tube feeding initiation and management  ASSESSMENT:   60F with history of depression and anxiety with outpatient prescriptions for Klonopin and Lithium who presented to the [facility] ED on 12/30 after an intentional overdose of benzodiazepines (klonopin, xanax) and alcohol. Reportedly she consumed 20 xanax, 12 Klonopin, 6 shots, 8-9 beers and then reported to the ED. She reportedly has been non-compliant with her lithium. It is unclear if she was taking seroquel. There was also some concern for benadryl or other anti-cholinergic substance. After arrival in the ED she became progressively more obtunded and required intubation  Pt transferred from ICU to medical floor on 01/07/16.   Pt unavailable at time of visit.   Reviewed SLP note from 01/10/16; recommending dysphagia 3 diet with thin liquids, with all goals met. Pt is currently on a soft diet. Per staff, continues to complains of nausea. Intake remains poor; PO: 0-50%.   Per MD notes, potential for medical stability tomorrow. Psych CSW following for inpatient psychiatric placement.   Labs reviewed.   Diet Order:  DIET SOFT Room service appropriate?: Yes; Fluid consistency:: Thin  Skin:  Reviewed, no issues  Last BM:  01/09/16  Height:   Ht Readings from Last 1 Encounters:  01/01/16 '5\' 8"'$  (1.727 m)    Weight:   Wt Readings from Last 1 Encounters:  01/09/16 231 lb 0.7 oz (104.8 kg)    Ideal Body Weight:  63.6 kg  BMI:  Body mass index is 35.14  kg/(m^2).  Estimated Nutritional Needs:   Kcal:  1800-2000  Protein:  100-115 grams  Fluid:  2L/day  EDUCATION NEEDS:   No education needs identified at this time  Lakresha Stifter A. Jimmye Norman, RD, LDN, CDE Pager: [phone] After hours Pager: [phone]

## 2016-01-11 DIAGNOSIS — T796XXA Traumatic ischemia of muscle, initial encounter: Secondary | ICD-10-CM

## 2016-01-11 LAB — CK: CK TOTAL: 30440 U/L — AB (ref 38–234)

## 2016-01-11 MED ORDER — SODIUM CHLORIDE 0.9 % IV SOLN
INTRAVENOUS | Status: DC
  Administered 2016-01-11: 10:00:00 via INTRAVENOUS
  Administered 2016-01-12: 100 mL/h via INTRAVENOUS

## 2016-01-11 NOTE — Progress Notes (Signed)
Assumed care from Ashley, RN

## 2016-01-11 NOTE — Clinical Social Work Psych Note (Signed)
Psych CSW continuing to follow patient for possible inpatient psychiatric hospitalization.  Psych CSW awaits MD evaluation for 1/12 to determine medical readiness for discharge.  Disposition: Inpatient Psychiatric Hospitalization  Vickii PennaGina Afrika Brick, LCSW [phone]  5N1-9; 2S 15-16 and Hospital Psychiatric Service Line Licensed Clinical Social Worker

## 2016-01-11 NOTE — Care Management Note (Signed)
Case Management Note  Patient Details  Name: Morrison Oldracy L XXX Coghill MRN: [id number] Date of Birth: [birth year]  Subjective/Objective:    Patient is for inpatient psych placement, ck was 13,000 and now back up to 30,000, she has a picc line, patient is still not medically ready until ckis are more acceptable.                Action/Plan:   Expected Discharge Date:                  Expected Discharge Plan:  [facility]iatric Hospital  In-House Referral:  Clinical Social Work  Discharge planning Services  CM Consult  Post Acute Care Choice:    Choice offered to:     DME Arranged:    DME Agency:     HH Arranged:    HH Agency:     Status of Service:  Completed, signed off  Medicare Important Message Given:    Date Medicare IM Given:    Medicare IM give by:    Date Additional Medicare IM Given:    Additional Medicare Important Message give by:     If discussed at Long Length of Stay Meetings, dates discussed:    Additional Comments:  Leone Havenaylor, Reese Stockman Clinton, RN 01/11/2016, 8:40 PM

## 2016-01-11 NOTE — Progress Notes (Signed)
PATIENT DETAILS Name: Stacie Sanders Age: 45 y.o. Sex: female Date of Birth: 09-06-1971 Admit Date: 12/29/2015 Admitting Physician Coralyn HellingVineet Sood, MD PCP:No PCP Per Patient  Subjective: Urine remains yellow/clear colored.No leg/thigh swelling.  Assessment/Plan: Principal Problem: Acute encephalopathy: Secondary to intentional drug overdose. Resolved, completely awake and alert.   Active Problems: Drug overdose:Intentional overdose of Klonopin, Xanax, alcohol and possibly antihistamine. Patient has a history of depression with anxiety. She was managed in the intensive care unit, she was intubated on admission. Subsequently, seen by psychiatry, recommendations for inpatient psychiatric admission on discharge. Unfortunately, continues to have persistent rhabdomyolysis- we will reconsult psychiatry today to see if we can restart some of her medications.  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 Unfortunately  CK down trended with IV fluids, but now has climbed back up again. Restart IV fluids, follow CK levels. Do not see any evidence of compartment syndrome /leg swelling on exam. She does complain of mild right thigh pain and numbness -but otherwise exam is very benign.  Acute hypoxic respiratory failure: Secondary to above. Resolved. Did have brief stridor post extubation, has completed a course of steroids. Now on room air.  Aspiration pneumonia: Completed course of antibiotic. Continue to monitor off antibiotics-doing well-afebrile.  ? Nonconvulsive status epilepticus: Was started on Keppra on admission-spoke with Dr. Angela NevinKirkpatrick-neurology on call on 1/11-case discussed over the phone-he advised to discontinue Keppra and monitor.    Elevated LFTs: Secondary to above. Follow.   Hypokalemia: Repleted   GERD: Continue PPI.  Disposition: Remain inpatient-once CK better-will be stable for discharge  Antimicrobial agents  See  below  Anti-infectives    Start     Dose/Rate Route Frequency Ordered Stop   01/03/16 1130  levofloxacin (LEVAQUIN) IVPB 500 mg  Status:  Discontinued     500 mg 100 mL/hr over 60 Minutes Intravenous Every 24 hours 01/03/16 1026 01/07/16 1158   12/31/15 1200  Ampicillin-Sulbactam (UNASYN) 3 g in sodium chloride 0.9 % 100 mL IVPB  Status:  Discontinued     3 g 100 mL/hr over 60 Minutes Intravenous Every 6 hours 12/31/15 1030 01/03/16 1004   12/29/15 1200  piperacillin-tazobactam (ZOSYN) IVPB 3.375 g  Status:  Discontinued     3.375 g 12.5 mL/hr over 240 Minutes Intravenous Every 8 hours 12/29/15 1139 12/31/15 1005   12/29/15 1200  vancomycin (VANCOCIN) IVPB 1000 mg/200 mL premix  Status:  Discontinued     1,000 mg 200 mL/hr over 60 Minutes Intravenous Every 8 hours 12/29/15 1139 12/31/15 1005      DVT Prophylaxis: Prophylactic Lovenox   Code Status: Full code   Family Communication Spouse at bedside  Procedures: None  CONSULTS:  pulmonary/intensive care and psychiatry  Time spent 25 minutes-Greater than 50% of this time was spent in counseling, explanation of diagnosis, planning of further management, and coordination of care.  MEDICATIONS: Scheduled Meds: . clonazePAM  0.5 mg Oral BID  . enoxaparin (LOVENOX) injection  40 mg Subcutaneous Q24H  . famotidine  20 mg Oral BID  . feeding supplement  1 Container Oral TID BM  . Influenza vac split quadrivalent PF  0.5 mL Intramuscular Tomorrow-1000  . nicotine  21 mg Transdermal Daily  . ondansetron (ZOFRAN) IV  4 mg Intravenous 4 times per day  . pantoprazole  40 mg Oral BID AC  . polyethylene glycol  17 g Oral Daily   Continuous Infusions: .  sodium chloride 10 mL/hr at 01/10/16 0847  . sodium chloride 100 mL/hr at 01/11/16 0951   PRN Meds:.acetaminophen, bisacodyl, clonazePAM, diphenhydrAMINE, docusate sodium, guaiFENesin-dextromethorphan, ipratropium-albuterol, oxyCODONE-acetaminophen, sennosides, sodium  chloride    PHYSICAL EXAM: Vital signs in last 24 hours: Filed Vitals:   01/10/16 0517 01/10/16 2116 01/11/16 0605 01/11/16 1354  BP: 119/76 127/75 118/55 128/67  Pulse: 91 102 78 87  Temp: 98 F (36.7 C) 98.4 F (36.9 C) 98.2 F (36.8 C)   TempSrc: Oral Oral Oral   Resp: 19 18 16 22   Height:      Weight:      SpO2: 98% 99% 97% 99%    Weight change:  Filed Weights   01/07/16 0447 01/08/16 0511 01/09/16 0549  Weight: 108.4 kg (238 lb 15.7 oz) 104.8 kg (231 lb 0.7 oz) 104.8 kg (231 lb 0.7 oz)   Body mass index is 35.14 kg/(m^2).   Gen Exam: Awake and alert with clear speech.   Neck: Supple, No JVD.   Chest: B/L Clear.   CVS: S1 S2 Regular, no murmurs.  Abdomen: soft, BS +, non tender, non distended.  Extremities: no edema, lower extremities warm to touch. No leg swelling/ erythema evident on exam Neurologic: Non Focal. Ambulating independently in the room Skin: No Rash.   Wounds: N/A.  Intake/Output from previous day:  Intake/Output Summary (Last 24 hours) at 01/11/16 1557 Last data filed at 01/11/16 1330  Gross per 24 hour  Intake    480 ml  Output      0 ml  Net    480 ml     LAB RESULTS: CBC  Recent Labs Lab 01/05/16 0536 01/06/16 0445 01/07/16 0450 01/08/16 0520 01/09/16 0540  WBC 12.8* 11.8* 11.5* 16.2* 14.0*  HGB 11.8* 12.5 12.3 12.5 11.9*  HCT 38.0 38.7 40.0 38.9 37.4  PLT 231 280 356 420* 430*  MCV 98.7 97.7 98.8 98.7 98.7  MCH 30.6 31.6 30.4 31.7 31.4  MCHC 31.1 32.3 30.8 32.1 31.8  RDW 13.5 13.2 13.2 13.1 13.1  LYMPHSABS 1.4 1.9 2.0  --   --   MONOABS 0.6 0.6 0.6  --   --   EOSABS 0.0 0.4 0.3  --   --   BASOSABS 0.0 0.0 0.0  --   --     Chemistries   Recent Labs Lab 01/05/16 0536  01/06/16 0445 01/06/16 2037 01/07/16 0450 01/08/16 0520 01/09/16 0540 01/10/16 0500  NA 155*  < > 149* 148* 147* 146* 142 145  K 2.9*  < > 2.9* 3.2* 3.4* 3.7 3.3* 3.5  CL 116*  < > 111 108 110 107 106 109  CO2 29  < > 28 28 27 28 28 27   GLUCOSE  143*  < > 163* 150* 164* 163* 151* 134*  BUN 7  < > <5* <5* <5* 5* <5* 6  CREATININE 0.78  < > 0.72 0.66 0.76 0.65 0.60 0.61  CALCIUM 8.6*  < > 8.6* 8.7* 8.7* 8.7* 8.6* 8.8*  MG 1.9  --  2.1  --  2.2  --   --   --   < > = values in this interval not displayed.  CBG:  Recent Labs Lab 01/06/16 2006 01/06/16 2350 01/07/16 0333 01/07/16 0757 01/07/16 1237  GLUCAP 120* 132* 142* 150* 157*    GFR Estimated Creatinine Clearance: 116.1 mL/min (by C-G formula based on Cr of 0.61).  Coagulation profile No results for input(s): INR, PROTIME in the last 168 hours.  Cardiac Enzymes No results for input(s): CKMB, TROPONINI, MYOGLOBIN in the last 168 hours.  Invalid input(s): CK  Invalid input(s): POCBNP No results for input(s): DDIMER in the last 72 hours. No results for input(s): HGBA1C in the last 72 hours. No results for input(s): CHOL, HDL, LDLCALC, TRIG, CHOLHDL, LDLDIRECT in the last 72 hours. No results for input(s): TSH, T4TOTAL, T3FREE, THYROIDAB in the last 72 hours.  Invalid input(s): FREET3 No results for input(s): VITAMINB12, FOLATE, FERRITIN, TIBC, IRON, RETICCTPCT in the last 72 hours. No results for input(s): LIPASE, AMYLASE in the last 72 hours.  Urine Studies No results for input(s): UHGB, CRYS in the last 72 hours.  Invalid input(s): UACOL, UAPR, USPG, UPH, UTP, UGL, UKET, UBIL, UNIT, UROB, ULEU, UEPI, UWBC, URBC, UBAC, CAST, UCOM, BILUA  MICROBIOLOGY: Recent Results (from the past 240 hour(s))  Culture, blood (Routine X 2) w Reflex to ID Panel     Status: None   Collection Time: 01/02/16  9:30 AM  Result Value Ref Range Status   Specimen Description BLOOD RIGHT ARM  Final   Special Requests IN PEDIATRIC BOTTLE 0.5CC  Final   Culture   Final    NO GROWTH 5 DAYS Performed at [facility]    Report Status 01/07/2016 FINAL  Final  Culture, blood (Routine X 2) w Reflex to ID Panel     Status: None   Collection Time: 01/02/16  9:35 AM  Result Value  Ref Range Status   Specimen Description BLOOD LEFT ARM  Final   Special Requests IN PEDIATRIC BOTTLE 1CC  Final   Culture   Final    NO GROWTH 5 DAYS Performed at [facility]    Report Status 01/07/2016 FINAL  Final  Culture, respiratory (NON-Expectorated)     Status: None   Collection Time: 01/02/16  9:40 AM  Result Value Ref Range Status   Specimen Description TRACHEAL ASPIRATE  Final   Special Requests Normal  Final   Gram Stain   Final    ABUNDANT WBC PRESENT, PREDOMINANTLY PMN RARE SQUAMOUS EPITHELIAL CELLS PRESENT NO ORGANISMS SEEN Performed at [facility]    Culture   Final    MODERATE YEAST Performed at [facility]    Report Status 01/04/2016 FINAL  Final  Culture, Urine     Status: None   Collection Time: 01/07/16  9:03 PM  Result Value Ref Range Status   Specimen Description URINE, RANDOM  Final   Special Requests Normal  Final   Culture MULTIPLE SPECIES PRESENT, SUGGEST RECOLLECTION  Final   Report Status 01/09/2016 FINAL  Final    RADIOLOGY STUDIES/RESULTS: Dg Abd 1 View  01/01/2016  CLINICAL DATA:  Nasogastric tube placement.  Initial encounter. EXAM: ABDOMEN - 1 VIEW COMPARISON:  Abdominal radiograph performed 12/29/2015 FINDINGS: The patient's enteric tube is noted ending overlying the body of the stomach. The stomach is largely filled with air. The visualized bowel gas pattern is grossly unremarkable. No free intra-abdominal air is identified, though evaluation for free air is limited on supine views. No acute osseous abnormalities are seen. IMPRESSION: Enteric tube noted ending overlying the body of the stomach. Electronically Signed   By: Roanna Raider M.D.   On: 01/01/2016 01:53   Dg Abd 1 View  12/29/2015  CLINICAL DATA:  OG tube placement. EXAM: ABDOMEN - 1 VIEW COMPARISON:  None. FINDINGS: OG tube tip is in the distal stomach. Bowel gas pattern is normal. No osseous abnormality. IMPRESSION: OG tube tip  in good position in the  distal stomach. Electronically Signed   By: Francene Boyers M.D.   On: 12/29/2015 10:50   Ct Head Wo Contrast  01/02/2016  CLINICAL DATA:  Status epilepticus. Drug overdose. Fever. Leukocytosis. EXAM: CT HEAD WITHOUT CONTRAST TECHNIQUE: Contiguous axial images were obtained from the base of the skull through the vertex without intravenous contrast. COMPARISON:  None. FINDINGS: No mass lesion. No midline shift. No acute hemorrhage or hematoma. No extra-axial fluid collections. No evidence of acute infarction. Brain parenchyma is normal. There is partial opacification of the paranasal sinuses. This is most likely chronic. Osseous structures are otherwise normal. IMPRESSION: No acute intracranial abnormality.  Chronic sinus disease. Electronically Signed   By: Francene Boyers M.D.   On: 01/02/2016 12:59   Mr Brain Wo Contrast  01/06/2016  CLINICAL DATA:  Acute encephalopathy. Benzodiazepine overdose. Left upper extremity pain and subjective weakness. EXAM: MRI HEAD WITHOUT CONTRAST TECHNIQUE: Multiplanar, multiecho pulse sequences of the brain and surrounding structures were obtained without intravenous contrast. COMPARISON:  Head CT 01/02/2016 FINDINGS: There is no evidence of acute infarct, intracranial hemorrhage, mass, midline shift, or extra-axial fluid collection. Ventricles and sulci are normal. The brain is normal in signal. Orbits are unremarkable. There is mild circumferential left sphenoid sinus mucosal thickening. Small fluid levels are present in both maxillary sinuses, and there are moderate right and small left mastoid effusions. Major intracranial vascular flow voids are preserved. IMPRESSION: 1. Unremarkable appearance of the brain. 2. Bilateral maxillary sinus fluid. Correlate clinically for acute sinusitis. 3. Right larger than left mastoid effusions. Electronically Signed   By: Sebastian Ache M.D.   On: 01/06/2016 12:49   Dg Chest Port 1 View  01/06/2016  CLINICAL DATA:  45 year old female with  respiratory failure EXAM: PORTABLE CHEST 1 VIEW COMPARISON:  Prior chest x-ray 01/05/2016 FINDINGS: Stable position of left upper extremity PICC with the tip overlying the superior cavoatrial junction. Inspiratory volumes are lower today resulting in crowding of the pulmonary vasculature. There is pulmonary venous congestion without overt edema. Mild bibasilar atelectasis. Stable borderline cardiomegaly. No acute osseous abnormality. IMPRESSION: 1. Lower inspiratory volumes with crowding of the pulmonary vasculature and bibasilar atelectasis. 2. Mild vascular congestion without overt edema. 3. Stable position of left upper extremity PICC. Electronically Signed   By: Malachy Moan M.D.   On: 01/06/2016 08:37   Dg Chest Port 1 View  01/05/2016  CLINICAL DATA:  Patient with respiratory failure. EXAM: PORTABLE CHEST 1 VIEW COMPARISON:  Chest radiograph 01/04/2016. FINDINGS: Left upper extremity PICC line tip projects over the right atrium, unchanged. Interval extubation. Stable cardiac and mediastinal contours. Significant interval improvement and near complete resolution of previously described bilateral diffuse airspace opacities. No pleural effusion or pneumothorax. IMPRESSION: Marked interval improvement in previously described airspace opacities. Interval extubation. Electronically Signed   By: Annia Belt M.D.   On: 01/05/2016 07:43   Dg Chest Port 1 View  01/04/2016  CLINICAL DATA:  Respiratory failure EXAM: PORTABLE CHEST 1 VIEW COMPARISON:  01/03/2016 FINDINGS: Endotracheal tube in good position. Central venous catheter tip in the right atrium unchanged. NG tube in the stomach with the tip not visualized. Diffuse bilateral airspace disease unchanged.  No effusion. IMPRESSION: Support lines remain in good position No significant change diffuse bilateral airspace disease. Electronically Signed   By: Marlan Palau M.D.   On: 01/04/2016 09:25   Dg Chest Port 1 View  01/03/2016  CLINICAL DATA:  Acute  respiratory failure, drug overdose, acute encephalopathy.  EXAM: PORTABLE CHEST 1 VIEW COMPARISON:  Portable chest x-ray of January 02, 2016 FINDINGS: The lungs are adequately inflated. The interstitial markings remain increased. Areas of confluent alveolar opacity persist but are slightly less conspicuous. The cardiac silhouette remains enlarged. The central pulmonary vascularity remains engorged. The endotracheal tube tip lies 3 cm above the carina. The esophagogastric tube tip projects below the inferior margin of the image. A left-sided PICC line has been placed since yesterday's study with the tip at or just above the cavoatrial junction. IMPRESSION: Slight interval improvement in the appearance of the pulmonary interstitium but considerable interstitial edema or pneumonia process. Mild cardiomegaly with mild central pulmonary vascular congestion. The support tubes are in reasonable position though the PICC line tip does lie at or just above the cavoatrial junction. Electronically Signed   By: David  [place] M.D.   On: 01/03/2016 07:32   Dg Chest Port 1 View  01/02/2016  CLINICAL DATA:  Acute respiratory failure, hypoxemia EXAM: PORTABLE CHEST 1 VIEW COMPARISON:  01/01/2016 and 12/31/2015 FINDINGS: Cardiomediastinal silhouette is stable. Endotracheal tube in place with tip 4 cm above the carina. Stable NG tube position. There is mild interstitial prominence bilaterally with subtle patchy airspace opacities. Findings suspicious for mild interstitial edema. Superimposed patchy alveolar edema or infiltrates cannot be excluded. Clinical correlation is necessary. IMPRESSION: There is mild interstitial prominence bilaterally with subtle patchy airspace opacities. Findings suspicious for mild interstitial edema. Superimposed patchy alveolar edema or infiltrates cannot be excluded. Clinical correlation is necessary. Electronically Signed   By: Natasha Mead M.D.   On: 01/02/2016 10:48   Portable Chest Xray  01/01/2016   CLINICAL DATA:  Central line placement and gastric tube placement. Respiratory distress. EXAM: PORTABLE CHEST 1 VIEW COMPARISON:  01/01/2016 FINDINGS: Endotracheal tube remains in stable position. NG tube is in the stomach. No visible central line. No pneumothorax. Heart is borderline in size with vascular congestion. Left lower lobe atelectasis or infiltrate and possible small left effusion. IMPRESSION: Endotracheal tube and NG tube remain in place, unchanged. No visible central line. Vascular congestion. Left lower lobe atelectasis or infiltrate with possible small left effusion. Electronically Signed   By: Charlett Nose M.D.   On: 01/01/2016 12:12   Dg Chest Port 1 View  01/01/2016  CLINICAL DATA:  Respiratory failure, intubated patient, drug overdose, current smoker. EXAM: PORTABLE CHEST 1 VIEW COMPARISON:  Portable chest x-ray of December 31, 2015 FINDINGS: The film is obtained in a reverse lordotic projection. The interstitial markings are more conspicuous bilaterally. The cardiac silhouette is mildly enlarged and the central pulmonary vascularity is engorged. The endotracheal tube tip lies 4 cm above the carina. The esophagogastric tube tip projects below the inferior margin of the image. IMPRESSION: Patient positioning today may be influencing the appearance of the pulmonary interstitium and cardiac silhouette. The findings suggest mild CHF. Correlation with the patient's clinical and laboratory values is needed. A repeat well-positioned portable chest x-ray may be useful. Electronically Signed   By: David  [place] M.D.   On: 01/01/2016 07:33   Dg Chest Port 1 View  12/31/2015  CLINICAL DATA:  Acute respiratory failure. EXAM: PORTABLE CHEST 1 VIEW COMPARISON:  12/29/2015 FINDINGS: 1023 hours. Endotracheal tube tip is 2.5 cm above the base of the carina. NG tube tip projects over the proximal stomach with proximal port of the tube at the level of the EG junction. There is some minimal basilar atelectasis. No  focal airspace consolidation or overt airspace pulmonary edema. The cardiopericardial silhouette  is within normal limits for size. The visualized bony structures of the thorax are intact. Telemetry leads overlie the chest. IMPRESSION: No substantial change in exam. Electronically Signed   By: Kennith Center M.D.   On: 12/31/2015 10:37   Dg Chest Portable 1 View  12/29/2015  CLINICAL DATA:  Post intubation. EXAM: PORTABLE CHEST 1 VIEW COMPARISON:  Chest radiograph December 29, 2015 at 0132 hours. FINDINGS: Endotracheal tube tip projects 2.2 cm above the carina. Nasogastric tube tip projects in proximal stomach, side port projecting immediately above GE junction. New RIGHT middle lobe consolidation and volume loss, LEFT lung base bandlike density. Stable mild cardiomegaly and pulmonary vascular congestion. No pneumothorax. Soft tissue planes and included osseous structures are nonsuspicious. IMPRESSION: Endotracheal tube tip projects 2.2 cm above the carina, nasogastric tube in proximal stomach, side port immediately above the GE junction. Mild cardiomegaly and pulmonary vascular congestion. New RIGHT middle lobe, LEFT lung base consolidation may represent atelectasis or pneumonia, less likely confluent edema. Electronically Signed   By: Awilda Metro M.D.   On: 12/29/2015 05:02   Dg Chest Portable 1 View  12/29/2015  CLINICAL DATA:  45 year old female with altered mental status EXAM: PORTABLE CHEST 1 VIEW COMPARISON:  Radiograph dated 11/27/2015 FINDINGS: Single-view of the chest demonstrates mild cardiomegaly with diffuse interstitial and vascular prominence likely representing a degree of congestive changes. There is no focal consolidation, pleural effusion, or pneumothorax. The osseous structures appear unremarkable. IMPRESSION: Cardiomegaly with mild congestive changes.  No focal consolidation. Electronically Signed   By: Elgie Collard M.D.   On: 12/29/2015 02:09   Dg Abd Portable 1v  01/02/2016   CLINICAL DATA:  Ileus, distended abdomen EXAM: PORTABLE ABDOMEN - 1 VIEW COMPARISON:  01/01/2016 FINDINGS: There is normal small bowel gas pattern. NG tube in place with tip in distal stomach. Moderate gaseous distension of the right colon and transverse colon. IMPRESSION: Normal small bowel gas pattern. Moderate gaseous distension of the right and transverse colon. NG tube in place. Electronically Signed   By: Natasha Mead M.D.   On: 01/02/2016 10:49   Dg Abd Portable 1v  01/01/2016  CLINICAL DATA:  Nasogastric tube placement EXAM: PORTABLE ABDOMEN - 1 VIEW COMPARISON:  Study obtained earlier in the day. FINDINGS: Nasogastric tube tip and side port in stomach. Stomach is no longer distended. Bowel gas pattern is unremarkable. No obstruction or free air. IMPRESSION: Nasogastric tube tip and side port in stomach. Bowel gas pattern unremarkable. Electronically Signed   By: Bretta Bang III M.D.   On: 01/01/2016 12:12    Jeoffrey Massed, MD  [facility] Pager:[phone]  If 7PM-7AM, please contact night-coverage [email or web address] Password [email or web address] 01/11/2016, 3:57 PM   LOS: 13 days

## 2016-01-12 DIAGNOSIS — T50902D Poisoning by unspecified drugs, medicaments and biological substances, intentional self-harm, subsequent encounter: Secondary | ICD-10-CM

## 2016-01-12 LAB — COMPREHENSIVE METABOLIC PANEL
ALK PHOS: 58 U/L (ref 38–126)
ALT: 133 U/L — AB (ref 14–54)
AST: 88 U/L — AB (ref 15–41)
Albumin: 2.6 g/dL — ABNORMAL LOW (ref 3.5–5.0)
Anion gap: 7 (ref 5–15)
BILIRUBIN TOTAL: 0.5 mg/dL (ref 0.3–1.2)
BUN: 7 mg/dL (ref 6–20)
CALCIUM: 8.8 mg/dL — AB (ref 8.9–10.3)
CHLORIDE: 109 mmol/L (ref 101–111)
CO2: 27 mmol/L (ref 22–32)
CREATININE: 0.71 mg/dL (ref 0.44–1.00)
GFR calc Af Amer: >60 mL/min (ref >60)
Glucose, Bld: 131 mg/dL — ABNORMAL HIGH (ref 65–99)
Potassium: 3.7 mmol/L (ref 3.5–5.1)
Sodium: 143 mmol/L (ref 135–145)
Total Protein: 5.1 g/dL — ABNORMAL LOW (ref 6.5–8.1)

## 2016-01-12 LAB — CK: CK TOTAL: 3636 U/L — AB (ref 38–234)

## 2016-01-12 MED ORDER — TRAMADOL HCL 50 MG PO TABS
50.0000 mg | ORAL_TABLET | Freq: Four times a day (QID) | ORAL | Status: DC
  Administered 2016-01-12 – 2016-01-13 (×4): 50 mg via ORAL
  Filled 2016-01-12 (×4): qty 1

## 2016-01-12 MED ORDER — SODIUM CHLORIDE 0.9 % IV SOLN
INTRAVENOUS | Status: AC
  Administered 2016-01-12 (×2): via INTRAVENOUS

## 2016-01-12 NOTE — Consult Note (Addendum)
[facility] Consult   Reason for Consult:  History of Bipolar Disorder, status post overdose, medication management  Referring Physician:  Dr. Grandville Silos Patient Identification: Stacie Sanders MRN:  [id number] Principal Diagnosis: Drug overdose Diagnosis:   Patient Active Problem List   Diagnosis Date Noted  . Acute respiratory failure ([place]) [J96.00] 01/03/2016  . Acute respiratory failure with hypoxemia ([place]) [J96.01]   . Acute encephalopathy [G93.40]   . Drug overdose [T50.901A] 12/29/2015  . Overdose [T50.901A] 12/29/2015    Total Time spent with patient: 30 minutes  Subjective:   Stacie Sanders is a 45 y.o. female patient admitted with suicidal attempt by overdose   HPI:  Please refer to full psychiatric consult by Dr. Louretta Shorten , 01/07/16. Patient is a 45 year old married female, who reports a long history of Bipolar Disorder. Serious suicide attempt on 12/31/ 16 by overdose, resulting in medical inpatient admission and period of intubation- currently extubated and fully alert and attentive . Patient states overdose was impulsive. She states she had been facing significant stressors, mainly death of grandson earlier in 01-Apr-2015, and that her psychiatric medications had recently been changed, switched . Patient reports she is still feeling " sad", but attributes this to not being at home with family. She denies any lingering or ongoing suicidal ideations, and makes statement " I am happy to be alive, I would never do anything like that again".  Appetite fair, but improving, energy " getting better", denies anhedonia, has been enjoying TV shows . Describes ongoing insomnia. Patient states she has been on multiple different psychiatric medications - she states she feels most medications she has been on  did not particularly help. She reports she gained significant weight on Latuda. She does remember Lithium as partially effective,and states her family told her she seemed  better on this medication.  She feels weight gain was related to Latuda more than to Lithium and states she would be " OK with starting Lithium again". Also remembers being on Lamictal, does not remember having side effects .  Of note, patient has had persistent CPK elevation- although CPK still elevated, it has dramatically  improved compared to yesterday's results ( from 30,4040 to 3636)  BUN 7 , Creatinine 0.71 01/03/16 EKG  Unremarkable, no QTc prolongation.  Past Psychiatric History:  Bipolar Disorder- patient does describe history of depression and episodes of decreased  Need for sleep, increased irritability. At this time does not endorse history of psychosis or any prior history of suicide attempts . Denies alcohol or drug abuse history  Risk to Self: Is patient at risk for suicide?: Yes Risk to Others:   Prior Inpatient Therapy:   Prior Outpatient Therapy:    Past Medical History:  Past Medical History  Diagnosis Date  . Depression   . Kidney stone     Past Surgical History  Procedure Laterality Date  . Tubal ligation    . Lithotripsy     Family History: History reviewed. No pertinent family history.  Social History:  History  Alcohol Use  . Yes    Comment: occasionally     History  Drug Use No    Social History   Social History  . Marital Status: Married    Spouse Name: N/A  . Number of Children: N/A  . Years of Education: N/A   Social History Main Topics  . Smoking status: Current Every Day Smoker    Types: Cigarettes  . Smokeless tobacco: None  .  Alcohol Use: Yes     Comment: occasionally  . Drug Use: No  . Sexual Activity: Not Asked   Other Topics Concern  . None   Social History Narrative   Additional Social History:   Allergies:   Allergies  Allergen Reactions  . Fentanyl Itching  . Percocet [Oxycodone-Acetaminophen] Hives and Itching    Labs:  Results for orders placed or performed during the hospital encounter of 12/29/15 (from the  past 48 hour(s))  CK     Status: Abnormal   Collection Time: 01/11/16  4:32 AM  Result Value Ref Range   Total CK 30440 (H) 38 - 234 U/L    Comment: RESULTS CONFIRMED BY MANUAL DILUTION  Comprehensive metabolic panel     Status: Abnormal   Collection Time: 01/12/16  5:16 AM  Result Value Ref Range   Sodium 143 135 - 145 mmol/L   Potassium 3.7 3.5 - 5.1 mmol/L   Chloride 109 101 - 111 mmol/L   CO2 27 22 - 32 mmol/L   Glucose, Bld 131 (H) 65 - 99 mg/dL   BUN 7 6 - 20 mg/dL   Creatinine, Ser 8.01 0.44 - 1.00 mg/dL   Calcium 8.8 (L) 8.9 - 10.3 mg/dL   Total Protein 5.1 (L) 6.5 - 8.1 g/dL   Albumin 2.6 (L) 3.5 - 5.0 g/dL   AST 88 (H) 15 - 41 U/L   ALT 133 (H) 14 - 54 U/L   Alkaline Phosphatase 58 38 - 126 U/L   Total Bilirubin 0.5 0.3 - 1.2 mg/dL   GFR calc non Af Amer >60 >60 mL/min   GFR calc Af Amer >60 >60 mL/min    Comment: (NOTE) The eGFR has been calculated using the CKD EPI equation. This calculation has not been validated in all clinical situations. eGFR's persistently <60 mL/min signify possible Chronic Kidney Disease.    Anion gap 7 5 - 15  CK     Status: Abnormal   Collection Time: 01/12/16  5:16 AM  Result Value Ref Range   Total CK 3636 (H) 38 - 234 U/L    Current Facility-Administered Medications  Medication Dose Route Frequency Provider Last Rate Last Dose  . 0.9 %  sodium chloride infusion   Intravenous Continuous Maretta Bees, MD 10 mL/hr at 01/10/16 0847    . 0.9 %  sodium chloride infusion   Intravenous Continuous Maretta Bees, MD 50 mL/hr at 01/12/16 1044    . acetaminophen (TYLENOL) tablet 650 mg  650 mg Oral Q6H PRN Kalman Shan, MD   650 mg at 01/05/16 2159  . bisacodyl (DULCOLAX) EC tablet 5 mg  5 mg Oral Daily PRN Lupita Leash, MD   5 mg at 01/05/16 0945  . clonazePAM (KLONOPIN) tablet 0.5 mg  0.5 mg Oral BID Lupita Leash, MD   0.5 mg at 01/12/16 1043  . clonazePAM (KLONOPIN) tablet 0.5 mg  0.5 mg Oral BID PRN Kalman Shan,  MD   0.5 mg at 01/04/16 1835  . diphenhydrAMINE (BENADRYL) capsule 25 mg  25 mg Oral Q6H PRN Maretta Bees, MD   25 mg at 01/12/16 0810  . docusate sodium (COLACE) capsule 100 mg  100 mg Oral BID PRN Roslynn Amble, MD   100 mg at 01/05/16 0945  . enoxaparin (LOVENOX) injection 40 mg  40 mg Subcutaneous Q24H Orville Govern, MD   40 mg at 01/12/16 1042  . famotidine (PEPCID) tablet 20 mg  20 mg Oral  BID Jonetta Osgood, MD   20 mg at 01/12/16 1043  . feeding supplement (BOOST / RESOURCE BREEZE) liquid 1 Container  1 Container Oral TID BM Domenick Bookbinder, RD   1 Container at 01/12/16 1000  . guaiFENesin-dextromethorphan (ROBITUSSIN DM) 100-10 MG/5ML syrup 5 mL  5 mL Oral Q4H PRN Gardiner Barefoot, NP   5 mL at 01/09/16 0143  . Influenza vac split quadrivalent PF (FLUARIX) injection 0.5 mL  0.5 mL Intramuscular Tomorrow-1000 Hosie Poisson, MD   0.5 mL at 01/09/16 1000  . ipratropium-albuterol (DUONEB) 0.5-2.5 (3) MG/3ML nebulizer solution 3 mL  3 mL Nebulization Q4H PRN Raylene Miyamoto, MD      . nicotine (NICODERM CQ - dosed in mg/24 hours) patch 21 mg  21 mg Transdermal Daily Hosie Poisson, MD   21 mg at 01/12/16 1043  . ondansetron (ZOFRAN) injection 4 mg  4 mg Intravenous 4 times per day Jonetta Osgood, MD   4 mg at 01/12/16 0617  . oxyCODONE-acetaminophen (PERCOCET/ROXICET) 5-325 MG per tablet 1-2 tablet  1-2 tablet Oral Q6H PRN Juanito Doom, MD   2 tablet at 01/12/16 [id number]  . pantoprazole (PROTONIX) EC tablet 40 mg  40 mg Oral BID AC William S Minor, NP   40 mg at 01/12/16 1043  . polyethylene glycol (MIRALAX / GLYCOLAX) packet 17 g  17 g Oral Daily Juanito Doom, MD   17 g at 01/12/16 1043  . sennosides (SENOKOT) 8.8 MG/5ML syrup 5 mL  5 mL Per Tube Daily PRN Juanito Doom, MD      . sodium chloride 0.9 % injection 10-40 mL  10-40 mL Intracatheter PRN Brand Males, MD   10 mL at 01/12/16 0516    Musculoskeletal: Strength & Muscle Tone: within normal  limits Gait & Station: gait not examined  Patient leans: N/A  Psychiatric Specialty Exam: ROS  Blood pressure 108/87, pulse 92, temperature 97.8 F (36.6 C), temperature source Oral, resp. rate 18, height '5\' 8"'$  (1.727 m), weight 231 lb 3.2 oz (104.872 kg), SpO2 100 %.Body mass index is 35.16 kg/(m^2).  General Appearance: Fairly Groomed  Engineer, water::  Good  Speech:  Normal Rate  Volume:  Normal  Mood:  improved, states she still feels depressed, but better than before   Affect:  Appropriate and does smile approppriately during session  Thought Process:  Linear  Orientation:  Full (Time, Place, and Person)- 0x3   Thought Content:  denies hallucinations, no delusions   Suicidal Thoughts:  No- patient denies any ongoing suicidal ideations or any self injurious ideations, states she is looking forward to spending time with family and " enjoying life "  Homicidal Thoughts:  No  Memory:  recent and remote grossly intact   Judgement:  Other:  improving   Insight:  Present  Psychomotor Activity:  Normal  Concentration:  Good  Recall:  Good  Fund of Knowledge:Good  Language: Good  Akathisia:  Negative  Handed:  Right  AIMS (if indicated):     Assets:  Communication Skills Desire for Improvement Resilience  ADL's:   Improving   Cognition: WNL  Sleep:         RECOMMENDATIONS - 1. At this time patient is not suicidal , not homicidal , not psychotic, and behavior presents calm, in good control. Based on this  there are NO CURRENT GROUNDS  FOR   INVOLUNTARY COMMITMENT to the inpatient psychiatric unit. I do recommend voluntary  inpatient psychiatric admission in  order  to work on restarting psychiatric medications/ treatment . 2.  Would consider restarting Lithium Carbonate at  low initial dose ( 150 mgrs BID initially),  And titrate up  gradually as tolerated .  3. Agree with Klonopin , currently at 0.5 mgrs BID. Would consider adjusting to 0.5 mgrs QAM and 1 mgr QHS to help with  insomnia . 4. Prior to discharge, please ask CSW to help patient obtain outpatient psychiatric appointment . She had been  followed at [facility] prior to admission. Consider referring to an Intensive Outpatient Psychiatric Program ( IOP ) if possible.    COBOS, FERNANDO 01/12/2016 2:50 PM

## 2016-01-12 NOTE — Progress Notes (Signed)
EKG obtained and results reported to Dr. Clearence PedSchorr. On coming nurse made aware and will continue to monitor.

## 2016-01-12 NOTE — Progress Notes (Signed)
PATIENT DETAILS Name: Stacie Sanders Age: 45 y.o. Sex: female Date of Birth: 08/06/1971 Admit Date: 12/29/2015 Admitting Physician Coralyn Helling, MD PCP:No PCP Per Patient  Subjective: Thinks she is allergic the bed sheet. Urine remains clear. Ambulating-spouse at bedside.  Assessment/Plan: Principal Problem: Acute encephalopathy: Secondary to intentional drug overdose. Resolved, completely awake and alert.   Active Problems: Drug overdose:Intentional overdose of Klonopin, Xanax, alcohol and possibly antihistamine. Patient has a history of depression with anxiety. She was managed in the intensive care unit, she was intubated on admission. Subsequently, seen by psychiatry, recommendations for inpatient psychiatric admission on discharge. Patient complaining of mood instability insomnia-awaiting psychiatry re-eval to see if we can restart some of her medications.  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 CK slowly down trending, decrease IVF today, recheck in am.   Acute hypoxic respiratory failure: Secondary to above. Resolved. Did have brief stridor post extubation, has completed a course of steroids. Now on room air.  Aspiration pneumonia: Completed course of antibiotic. Continue to monitor off antibiotics-doing well-afebrile.  ? Nonconvulsive status epilepticus: Was started on Keppra on admission-spoke with Dr. Angela Nevin on call on 1/11-case discussed over the phone-he advised to discontinue Keppra and monitor.    Elevated LFTs: Secondary to above. Follow.   Hypokalemia: Repleted   GERD: Continue PPI.  Disposition: Remain inpatient-once CK better-will be stable for discharge  Antimicrobial agents  See below  Anti-infectives    Start     Dose/Rate Route Frequency Ordered Stop   01/03/16 1130  levofloxacin (LEVAQUIN) IVPB 500 mg  Status:  Discontinued     500 mg 100 mL/hr over 60 Minutes Intravenous Every  24 hours 01/03/16 1026 01/07/16 1158   12/31/15 1200  Ampicillin-Sulbactam (UNASYN) 3 g in sodium chloride 0.9 % 100 mL IVPB  Status:  Discontinued     3 g 100 mL/hr over 60 Minutes Intravenous Every 6 hours 12/31/15 1030 01/03/16 1004   12/29/15 1200  piperacillin-tazobactam (ZOSYN) IVPB 3.375 g  Status:  Discontinued     3.375 g 12.5 mL/hr over 240 Minutes Intravenous Every 8 hours 12/29/15 1139 12/31/15 1005   12/29/15 1200  vancomycin (VANCOCIN) IVPB 1000 mg/200 mL premix  Status:  Discontinued     1,000 mg 200 mL/hr over 60 Minutes Intravenous Every 8 hours 12/29/15 1139 12/31/15 1005      DVT Prophylaxis: Prophylactic Lovenox   Code Status: Full code   Family Communication Spouse at bedside  Procedures: None  CONSULTS:  pulmonary/intensive care and psychiatry  Time spent 25 minutes-Greater than 50% of this time was spent in counseling, explanation of diagnosis, planning of further management, and coordination of care.  MEDICATIONS: Scheduled Meds: . clonazePAM  0.5 mg Oral BID  . enoxaparin (LOVENOX) injection  40 mg Subcutaneous Q24H  . famotidine  20 mg Oral BID  . feeding supplement  1 Container Oral TID BM  . Influenza vac split quadrivalent PF  0.5 mL Intramuscular Tomorrow-1000  . nicotine  21 mg Transdermal Daily  . ondansetron (ZOFRAN) IV  4 mg Intravenous 4 times per day  . pantoprazole  40 mg Oral BID AC  . polyethylene glycol  17 g Oral Daily   Continuous Infusions: . sodium chloride 10 mL/hr at 01/10/16 0847  . sodium chloride 100 mL/hr (01/12/16 0435)   PRN Meds:.acetaminophen, bisacodyl, clonazePAM, diphenhydrAMINE, docusate sodium, guaiFENesin-dextromethorphan, ipratropium-albuterol, oxyCODONE-acetaminophen, sennosides, sodium chloride    PHYSICAL  EXAM: Vital signs in last 24 hours: Filed Vitals:   01/11/16 1354 01/11/16 2153 01/12/16 0443 01/12/16 0523  BP: 128/67 118/75  119/70  Pulse: 87 95  86  Temp:  98 F (36.7 C)  98.1 F (36.7 C)   TempSrc:  Oral  Oral  Resp: 22 19  19   Height:      Weight:   104.872 kg (231 lb 3.2 oz)   SpO2: 99% 100%  97%    Weight change:  Filed Weights   01/08/16 0511 01/09/16 0549 01/12/16 0443  Weight: 104.8 kg (231 lb 0.7 oz) 104.8 kg (231 lb 0.7 oz) 104.872 kg (231 lb 3.2 oz)   Body mass index is 35.16 kg/(m^2).   Gen Exam: Awake and alert with clear speech.   Neck: Supple, No JVD.   Chest: B/L Clear.   CVS: S1 S2 Regular, no murmurs.  Abdomen: soft, BS +, non tender, non distended.  Extremities: no edema, lower extremities warm to touch. No leg swelling/ erythema evident on exam Neurologic: Non Focal. Ambulating independently in the room Skin: No Rash.   Wounds: N/A.  Intake/Output from previous day:  Intake/Output Summary (Last 24 hours) at 01/12/16 0939 Last data filed at 01/12/16 0929  Gross per 24 hour  Intake 1671.67 ml  Output   2325 ml  Net -653.33 ml     LAB RESULTS: CBC  Recent Labs Lab 01/06/16 0445 01/07/16 0450 01/08/16 0520 01/09/16 0540  WBC 11.8* 11.5* 16.2* 14.0*  HGB 12.5 12.3 12.5 11.9*  HCT 38.7 40.0 38.9 37.4  PLT 280 356 420* 430*  MCV 97.7 98.8 98.7 98.7  MCH 31.6 30.4 31.7 31.4  MCHC 32.3 30.8 32.1 31.8  RDW 13.2 13.2 13.1 13.1  LYMPHSABS 1.9 2.0  --   --   MONOABS 0.6 0.6  --   --   EOSABS 0.4 0.3  --   --   BASOSABS 0.0 0.0  --   --     Chemistries   Recent Labs Lab 01/06/16 0445  01/07/16 0450 01/08/16 0520 01/09/16 0540 01/10/16 0500 01/12/16 0516  NA 149*  < > 147* 146* 142 145 143  K 2.9*  < > 3.4* 3.7 3.3* 3.5 3.7  CL 111  < > 110 107 106 109 109  CO2 28  < > 27 28 28 27 27   GLUCOSE 163*  < > 164* 163* 151* 134* 131*  BUN <5*  < > <5* 5* <5* 6 7  CREATININE 0.72  < > 0.76 0.65 0.60 0.61 0.71  CALCIUM 8.6*  < > 8.7* 8.7* 8.6* 8.8* 8.8*  MG 2.1  --  2.2  --   --   --   --   < > = values in this interval not displayed.  CBG:  Recent Labs Lab 01/06/16 2006 01/06/16 2350 01/07/16 0333 01/07/16 0757  01/07/16 1237  GLUCAP 120* 132* 142* 150* 157*    GFR Estimated Creatinine Clearance: 116.1 mL/min (by C-G formula based on Cr of 0.71).  Coagulation profile No results for input(s): INR, PROTIME in the last 168 hours.  Cardiac Enzymes No results for input(s): CKMB, TROPONINI, MYOGLOBIN in the last 168 hours.  Invalid input(s): CK  Invalid input(s): POCBNP No results for input(s): DDIMER in the last 72 hours. No results for input(s): HGBA1C in the last 72 hours. No results for input(s): CHOL, HDL, LDLCALC, TRIG, CHOLHDL, LDLDIRECT in the last 72 hours. No results for input(s): TSH, T4TOTAL, T3FREE, THYROIDAB in the  last 72 hours.  Invalid input(s): FREET3 No results for input(s): VITAMINB12, FOLATE, FERRITIN, TIBC, IRON, RETICCTPCT in the last 72 hours. No results for input(s): LIPASE, AMYLASE in the last 72 hours.  Urine Studies No results for input(s): UHGB, CRYS in the last 72 hours.  Invalid input(s): UACOL, UAPR, USPG, UPH, UTP, UGL, UKET, UBIL, UNIT, UROB, ULEU, UEPI, UWBC, URBC, UBAC, CAST, UCOM, BILUA  MICROBIOLOGY: Recent Results (from the past 240 hour(s))  Culture, respiratory (NON-Expectorated)     Status: None   Collection Time: 01/02/16  9:40 AM  Result Value Ref Range Status   Specimen Description TRACHEAL ASPIRATE  Final   Special Requests Normal  Final   Gram Stain   Final    ABUNDANT WBC PRESENT, PREDOMINANTLY PMN RARE SQUAMOUS EPITHELIAL CELLS PRESENT NO ORGANISMS SEEN Performed at [facility]    Culture   Final    MODERATE YEAST Performed at [facility]    Report Status 01/04/2016 FINAL  Final  Culture, Urine     Status: None   Collection Time: 01/07/16  9:03 PM  Result Value Ref Range Status   Specimen Description URINE, RANDOM  Final   Special Requests Normal  Final   Culture MULTIPLE SPECIES PRESENT, SUGGEST RECOLLECTION  Final   Report Status 01/09/2016 FINAL  Final    RADIOLOGY STUDIES/RESULTS: Dg Abd 1  View  01-12-2016  CLINICAL DATA:  Nasogastric tube placement.  Initial encounter. EXAM: ABDOMEN - 1 VIEW COMPARISON:  Abdominal radiograph performed 12/29/2015 FINDINGS: The patient's enteric tube is noted ending overlying the body of the stomach. The stomach is largely filled with air. The visualized bowel gas pattern is grossly unremarkable. No free intra-abdominal air is identified, though evaluation for free air is limited on supine views. No acute osseous abnormalities are seen. IMPRESSION: Enteric tube noted ending overlying the body of the stomach. Electronically Signed   By: Roanna Raider M.D.   On: Jan 12, 2016 01:53   Dg Abd 1 View  12/29/2015  CLINICAL DATA:  OG tube placement. EXAM: ABDOMEN - 1 VIEW COMPARISON:  None. FINDINGS: OG tube tip is in the distal stomach. Bowel gas pattern is normal. No osseous abnormality. IMPRESSION: OG tube tip in good position in the distal stomach. Electronically Signed   By: Francene Boyers M.D.   On: 12/29/2015 10:50   Ct Head Wo Contrast  01/02/2016  CLINICAL DATA:  Status epilepticus. Drug overdose. Fever. Leukocytosis. EXAM: CT HEAD WITHOUT CONTRAST TECHNIQUE: Contiguous axial images were obtained from the base of the skull through the vertex without intravenous contrast. COMPARISON:  None. FINDINGS: No mass lesion. No midline shift. No acute hemorrhage or hematoma. No extra-axial fluid collections. No evidence of acute infarction. Brain parenchyma is normal. There is partial opacification of the paranasal sinuses. This is most likely chronic. Osseous structures are otherwise normal. IMPRESSION: No acute intracranial abnormality.  Chronic sinus disease. Electronically Signed   By: Francene Boyers M.D.   On: 01/02/2016 12:59   Mr Brain Wo Contrast  01/06/2016  CLINICAL DATA:  Acute encephalopathy. Benzodiazepine overdose. Left upper extremity pain and subjective weakness. EXAM: MRI HEAD WITHOUT CONTRAST TECHNIQUE: Multiplanar, multiecho pulse sequences of the  brain and surrounding structures were obtained without intravenous contrast. COMPARISON:  Head CT 01/02/2016 FINDINGS: There is no evidence of acute infarct, intracranial hemorrhage, mass, midline shift, or extra-axial fluid collection. Ventricles and sulci are normal. The brain is normal in signal. Orbits are unremarkable. There is mild circumferential left sphenoid sinus mucosal thickening.  Small fluid levels are present in both maxillary sinuses, and there are moderate right and small left mastoid effusions. Major intracranial vascular flow voids are preserved. IMPRESSION: 1. Unremarkable appearance of the brain. 2. Bilateral maxillary sinus fluid. Correlate clinically for acute sinusitis. 3. Right larger than left mastoid effusions. Electronically Signed   By: Sebastian AcheAllen  Grady M.D.   On: 01/06/2016 12:49   Dg Chest Port 1 View  01/06/2016  CLINICAL DATA:  45 year old female with respiratory failure EXAM: PORTABLE CHEST 1 VIEW COMPARISON:  Prior chest x-ray 01/05/2016 FINDINGS: Stable position of left upper extremity PICC with the tip overlying the superior cavoatrial junction. Inspiratory volumes are lower today resulting in crowding of the pulmonary vasculature. There is pulmonary venous congestion without overt edema. Mild bibasilar atelectasis. Stable borderline cardiomegaly. No acute osseous abnormality. IMPRESSION: 1. Lower inspiratory volumes with crowding of the pulmonary vasculature and bibasilar atelectasis. 2. Mild vascular congestion without overt edema. 3. Stable position of left upper extremity PICC. Electronically Signed   By: Malachy MoanHeath  McCullough M.D.   On: 01/06/2016 08:37   Dg Chest Port 1 View  01/05/2016  CLINICAL DATA:  Patient with respiratory failure. EXAM: PORTABLE CHEST 1 VIEW COMPARISON:  Chest radiograph 01/04/2016. FINDINGS: Left upper extremity PICC line tip projects over the right atrium, unchanged. Interval extubation. Stable cardiac and mediastinal contours. Significant interval  improvement and near complete resolution of previously described bilateral diffuse airspace opacities. No pleural effusion or pneumothorax. IMPRESSION: Marked interval improvement in previously described airspace opacities. Interval extubation. Electronically Signed   By: Annia Beltrew  Intrieri M.D.   On: 01/05/2016 07:43   Dg Chest Port 1 View  01/04/2016  CLINICAL DATA:  Respiratory failure EXAM: PORTABLE CHEST 1 VIEW COMPARISON:  01/03/2016 FINDINGS: Endotracheal tube in good position. Central venous catheter tip in the right atrium unchanged. NG tube in the stomach with the tip not visualized. Diffuse bilateral airspace disease unchanged.  No effusion. IMPRESSION: Support lines remain in good position No significant change diffuse bilateral airspace disease. Electronically Signed   By: Marlan Palauharles  Clark M.D.   On: 01/04/2016 09:25   Dg Chest Port 1 View  01/03/2016  CLINICAL DATA:  Acute respiratory failure, drug overdose, acute encephalopathy. EXAM: PORTABLE CHEST 1 VIEW COMPARISON:  Portable chest x-ray of January 02, 2016 FINDINGS: The lungs are adequately inflated. The interstitial markings remain increased. Areas of confluent alveolar opacity persist but are slightly less conspicuous. The cardiac silhouette remains enlarged. The central pulmonary vascularity remains engorged. The endotracheal tube tip lies 3 cm above the carina. The esophagogastric tube tip projects below the inferior margin of the image. A left-sided PICC line has been placed since yesterday's study with the tip at or just above the cavoatrial junction. IMPRESSION: Slight interval improvement in the appearance of the pulmonary interstitium but considerable interstitial edema or pneumonia process. Mild cardiomegaly with mild central pulmonary vascular congestion. The support tubes are in reasonable position though the PICC line tip does lie at or just above the cavoatrial junction. Electronically Signed   By: David  SwazilandJordan M.D.   On: 01/03/2016 07:32    Dg Chest Port 1 View  01/02/2016  CLINICAL DATA:  Acute respiratory failure, hypoxemia EXAM: PORTABLE CHEST 1 VIEW COMPARISON:  01/01/2016 and 12/31/2015 FINDINGS: Cardiomediastinal silhouette is stable. Endotracheal tube in place with tip 4 cm above the carina. Stable NG tube position. There is mild interstitial prominence bilaterally with subtle patchy airspace opacities. Findings suspicious for mild interstitial edema. Superimposed patchy alveolar edema or infiltrates cannot  be excluded. Clinical correlation is necessary. IMPRESSION: There is mild interstitial prominence bilaterally with subtle patchy airspace opacities. Findings suspicious for mild interstitial edema. Superimposed patchy alveolar edema or infiltrates cannot be excluded. Clinical correlation is necessary. Electronically Signed   By: Natasha Mead M.D.   On: 01/02/2016 10:48   Portable Chest Xray  01/01/2016  CLINICAL DATA:  Central line placement and gastric tube placement. Respiratory distress. EXAM: PORTABLE CHEST 1 VIEW COMPARISON:  01/01/2016 FINDINGS: Endotracheal tube remains in stable position. NG tube is in the stomach. No visible central line. No pneumothorax. Heart is borderline in size with vascular congestion. Left lower lobe atelectasis or infiltrate and possible small left effusion. IMPRESSION: Endotracheal tube and NG tube remain in place, unchanged. No visible central line. Vascular congestion. Left lower lobe atelectasis or infiltrate with possible small left effusion. Electronically Signed   By: Charlett Nose M.D.   On: 01/01/2016 12:12   Dg Chest Port 1 View  01/01/2016  CLINICAL DATA:  Respiratory failure, intubated patient, drug overdose, current smoker. EXAM: PORTABLE CHEST 1 VIEW COMPARISON:  Portable chest x-ray of December 31, 2015 FINDINGS: The film is obtained in a reverse lordotic projection. The interstitial markings are more conspicuous bilaterally. The cardiac silhouette is mildly enlarged and the central  pulmonary vascularity is engorged. The endotracheal tube tip lies 4 cm above the carina. The esophagogastric tube tip projects below the inferior margin of the image. IMPRESSION: Patient positioning today may be influencing the appearance of the pulmonary interstitium and cardiac silhouette. The findings suggest mild CHF. Correlation with the patient's clinical and laboratory values is needed. A repeat well-positioned portable chest x-ray may be useful. Electronically Signed   By: David  [place] M.D.   On: 01/01/2016 07:33   Dg Chest Port 1 View  12/31/2015  CLINICAL DATA:  Acute respiratory failure. EXAM: PORTABLE CHEST 1 VIEW COMPARISON:  12/29/2015 FINDINGS: 1023 hours. Endotracheal tube tip is 2.5 cm above the base of the carina. NG tube tip projects over the proximal stomach with proximal port of the tube at the level of the EG junction. There is some minimal basilar atelectasis. No focal airspace consolidation or overt airspace pulmonary edema. The cardiopericardial silhouette is within normal limits for size. The visualized bony structures of the thorax are intact. Telemetry leads overlie the chest. IMPRESSION: No substantial change in exam. Electronically Signed   By: Kennith Center M.D.   On: 12/31/2015 10:37   Dg Chest Portable 1 View  12/29/2015  CLINICAL DATA:  Post intubation. EXAM: PORTABLE CHEST 1 VIEW COMPARISON:  Chest radiograph December 29, 2015 at 0132 hours. FINDINGS: Endotracheal tube tip projects 2.2 cm above the carina. Nasogastric tube tip projects in proximal stomach, side port projecting immediately above GE junction. New RIGHT middle lobe consolidation and volume loss, LEFT lung base bandlike density. Stable mild cardiomegaly and pulmonary vascular congestion. No pneumothorax. Soft tissue planes and included osseous structures are nonsuspicious. IMPRESSION: Endotracheal tube tip projects 2.2 cm above the carina, nasogastric tube in proximal stomach, side port immediately above the GE  junction. Mild cardiomegaly and pulmonary vascular congestion. New RIGHT middle lobe, LEFT lung base consolidation may represent atelectasis or pneumonia, less likely confluent edema. Electronically Signed   By: Awilda Metro M.D.   On: 12/29/2015 05:02   Dg Chest Portable 1 View  12/29/2015  CLINICAL DATA:  44 year old female with altered mental status EXAM: PORTABLE CHEST 1 VIEW COMPARISON:  Radiograph dated 11/27/2015 FINDINGS: Single-view of the chest demonstrates mild cardiomegaly  with diffuse interstitial and vascular prominence likely representing a degree of congestive changes. There is no focal consolidation, pleural effusion, or pneumothorax. The osseous structures appear unremarkable. IMPRESSION: Cardiomegaly with mild congestive changes.  No focal consolidation. Electronically Signed   By: Elgie Collard M.D.   On: 12/29/2015 02:09   Dg Abd Portable 1v  01/02/2016  CLINICAL DATA:  Ileus, distended abdomen EXAM: PORTABLE ABDOMEN - 1 VIEW COMPARISON:  01/01/2016 FINDINGS: There is normal small bowel gas pattern. NG tube in place with tip in distal stomach. Moderate gaseous distension of the right colon and transverse colon. IMPRESSION: Normal small bowel gas pattern. Moderate gaseous distension of the right and transverse colon. NG tube in place. Electronically Signed   By: Natasha Mead M.D.   On: 01/02/2016 10:49   Dg Abd Portable 1v  01/01/2016  CLINICAL DATA:  Nasogastric tube placement EXAM: PORTABLE ABDOMEN - 1 VIEW COMPARISON:  Study obtained earlier in the day. FINDINGS: Nasogastric tube tip and side port in stomach. Stomach is no longer distended. Bowel gas pattern is unremarkable. No obstruction or free air. IMPRESSION: Nasogastric tube tip and side port in stomach. Bowel gas pattern unremarkable. Electronically Signed   By: Bretta Bang III M.D.   On: 01/01/2016 12:12    Jeoffrey Massed, MD  [facility] Pager:[phone]  If 7PM-7AM, please contact  night-coverage [email or web address] Password [email or web address] 01/12/2016, 9:39 AM   LOS: 14 days

## 2016-01-12 NOTE — Progress Notes (Signed)
Pt c/o chest pain. No radiation or diaphoresis noted. Obtaining EKG. MD made aware. Continuing to monitor.

## 2016-01-12 NOTE — Progress Notes (Signed)
This Clinical research associatewriter spoke with Maretta BeesShanker M Ghimire, MD as he is inquiring about follow up from a psychiatrist today and the need for medication recommendations.     This Clinical research associatewriter spoke with Dr. Jama Flavorsobos who is on call for consults today to provide him with the above information and he has agreed to follow up.     Maryelizabeth Rowanressa Destan Franchini, MSW, Clare CharonLCSW, LCAS [facility] Triage Specialist [phone]

## 2016-01-13 LAB — COMPREHENSIVE METABOLIC PANEL
ALBUMIN: 2.6 g/dL — AB (ref 3.5–5.0)
ALT: 110 U/L — AB (ref 14–54)
AST: 61 U/L — AB (ref 15–41)
Alkaline Phosphatase: 59 U/L (ref 38–126)
Anion gap: 8 (ref 5–15)
BUN: 6 mg/dL (ref 6–20)
CHLORIDE: 107 mmol/L (ref 101–111)
CO2: 30 mmol/L (ref 22–32)
CREATININE: 0.63 mg/dL (ref 0.44–1.00)
Calcium: 8.9 mg/dL (ref 8.9–10.3)
GFR calc Af Amer: >60 mL/min (ref >60)
Glucose, Bld: 154 mg/dL — ABNORMAL HIGH (ref 65–99)
POTASSIUM: 3.1 mmol/L — AB (ref 3.5–5.1)
SODIUM: 145 mmol/L (ref 135–145)
Total Bilirubin: 0.4 mg/dL (ref 0.3–1.2)
Total Protein: 5.3 g/dL — ABNORMAL LOW (ref 6.5–8.1)

## 2016-01-13 LAB — CK: CK TOTAL: 2355 U/L — AB (ref 38–234)

## 2016-01-13 MED ORDER — CLONAZEPAM 0.5 MG PO TABS: 0.5000 mg | ORAL_TABLET | Freq: Every morning | ORAL | Status: DC

## 2016-01-13 MED ORDER — CLONAZEPAM 1 MG PO TABS: 1.0000 mg | ORAL_TABLET | Freq: Every day | ORAL | Status: DC

## 2016-01-13 MED ORDER — OMEPRAZOLE MAGNESIUM 20 MG PO TBEC: 40.0000 mg | DELAYED_RELEASE_TABLET | Freq: Every day | ORAL | Status: DC

## 2016-01-13 MED ORDER — LITHIUM CARBONATE 150 MG PO CAPS
150.0000 mg | ORAL_CAPSULE | Freq: Two times a day (BID) | ORAL | Status: DC
  Administered 2016-01-13: 150 mg via ORAL
  Filled 2016-01-13 (×2): qty 1

## 2016-01-13 MED ORDER — POTASSIUM CHLORIDE CRYS ER 20 MEQ PO TBCR
40.0000 meq | EXTENDED_RELEASE_TABLET | Freq: Once | ORAL | Status: AC
  Administered 2016-01-13: 40 meq via ORAL
  Filled 2016-01-13: qty 2

## 2016-01-13 MED ORDER — LITHIUM CARBONATE 150 MG PO CAPS: 150.0000 mg | ORAL_CAPSULE | Freq: Every day | ORAL | Status: DC

## 2016-01-13 MED ORDER — CLONAZEPAM 0.5 MG PO TABS
0.5000 mg | ORAL_TABLET | Freq: Every morning | ORAL | Status: DC
  Administered 2016-01-13: 0.5 mg via ORAL
  Filled 2016-01-13: qty 1

## 2016-01-13 NOTE — Progress Notes (Signed)
Stacie Sanders Web discharged Home per MD order.  Discharge instructions reviewed and discussed with the patient, all questions and concerns answered. Copy of instructions, care notes given for new medications & diagnosis and scripts given to patient.    Medication List    STOP taking these medications        cloNIDine 0.1 MG tablet  Commonly known as:  CATAPRES     hydrOXYzine 25 MG capsule  Commonly known as:  VISTARIL     mirtazapine 15 MG tablet  Commonly known as:  REMERON     QUEtiapine 50 MG Tb24 24 hr tablet  Commonly known as:  SEROQUEL XR      TAKE these medications        clonazePAM 0.5 MG tablet  Commonly known as:  KLONOPIN  Take 1 tablet (0.5 mg total) by mouth every morning.     clonazePAM 1 MG tablet  Commonly known as:  KLONOPIN  Take 1 tablet (1 mg total) by mouth at bedtime.     lithium carbonate 150 MG capsule  Take 1 capsule (150 mg total) by mouth at bedtime.     omeprazole 20 MG tablet  Commonly known as:  PRILOSEC OTC  Take 2 tablets (40 mg total) by mouth daily.        Patients skin is clean, dry and intact, no evidence of skin break down. IV site discontinued and catheter remains intact. Site without signs and symptoms of complications. Dressing and pressure applied.  Patient escorted to car by NT in a wheelchair,  no distress noted upon discharge.  Stacie Sanders, Stacie Sanders 01/13/2016 5:19 PM

## 2016-01-13 NOTE — Care Management Note (Signed)
Case Management Note  Patient Details  Name: NYASHIA RANEY MRN: [id number] Date of Birth: 03/18/1971  Subjective/Objective:                   overdose Action/Plan: Discharge planning  Expected Discharge Date:  01/13/16              Expected Discharge Plan:  Home/Self Care  In-House Referral:  Clinical Social Work  Discharge planning Services  CM Consult  Post Acute Care Choice:    Choice offered to:  Patient  DME Arranged:    DME Agency:     HH Arranged:  Patient Refused [place] Agency:     Status of Service:  Completed, signed off  Medicare Important Message Given:    Date Medicare IM Given:    Medicare IM give by:    Date Additional Medicare IM Given:    Additional Medicare Important Message give by:     If discussed at [place] of Stay Meetings, dates discussed:    Additional Comments: CM met with pt (also present is Randall Hiss CSW) and discussed her refusal for VIP hospitalization.  Pt states she is seen by psychiatrist and is willing to do intensive outpt therapy but will not go to [facility] setting.  CM gave pt walk-in clinic information for [facility] which she states she will go from 9-10 if she needs it. Pt states she has medication and this is not a problem for her to secure.  We discussed her multiple ED visits and encouraged her to establish herself with [facility] or [facility] (if needed for overflow).  Pt states she will do fine wither her psychiatrist.  Pt states she understands the importance of meeting with a Navigator for insurance; but has not pursued insurance.  CM gave pt [employer] [place] handout for her to have yet another avenue to pursue insurance (free of charge).  No other CM needs were communicated. Dellie Catholic, RN 01/13/2016, 12:22 PM

## 2016-01-13 NOTE — Clinical Social Work Note (Signed)
CSW met with patient to discuss if she would be interested in voluntarily admitting herself to inpatient psych, patient declined.  CSW spoke to patient regarding her psychiatrist.  Patient states she has one that she sees regularly at [place].  Patient stated she will follow up with her psychiatrist to discuss her meds and other concerns.  Patient was given information from case manager regarding walk in clinic, and CSW gave her contact information on [facility] and the address.  CSW informed her that if she ever feels like she needs inpatient psych she can go to [facility] as a walk in patient.  Patient stated she will follow up with her psychiatrist, patient expressed being grateful for contact information.  CSW to sign off, please reconsult if other social work needs arise.  Jones Broom. [place], MSW, [place] 01/13/2016 2:58 PM

## 2016-01-13 NOTE — Discharge Summary (Signed)
PATIENT DETAILS Name: Stacie Sanders Age: 45 y.o. Sex: female Date of Birth: 1971/11/03 MRN: [id number]. Admitting Physician: Coralyn Helling, MD PCP:No PCP Per Patient  Admit Date: 12/29/2015 Discharge date: 01/13/2016  Recommendations for Outpatient Follow-up:  1. Age appropriate general health maintenance 2. Repeat LFT's at next visit. 3. Repeat CK at next visit.   PRIMARY DISCHARGE DIAGNOSIS:  Principal Problem:   Drug overdose Active Problems:   Overdose   Acute encephalopathy   Acute respiratory failure (HCC)   Acute respiratory failure with hypoxemia (HCC)      PAST MEDICAL HISTORY: Past Medical History  Diagnosis Date  . Depression   . Kidney stone     DISCHARGE MEDICATIONS: Current Discharge Medication List    CONTINUE these medications which have CHANGED   Details  !! clonazePAM (KLONOPIN) 0.5 MG tablet Take 1 tablet (0.5 mg total) by mouth every morning. Qty: 30 tablet, Refills: 0    !! clonazePAM (KLONOPIN) 1 MG tablet Take 1 tablet (1 mg total) by mouth at bedtime. Qty: 30 tablet, Refills: 0    lithium carbonate 150 MG capsule Take 1 capsule (150 mg total) by mouth at bedtime. Qty: 60 capsule, Refills: 0    omeprazole (PRILOSEC OTC) 20 MG tablet Take 2 tablets (40 mg total) by mouth daily. Qty: 60 tablet, Refills: 0     !! - Potential duplicate medications found. Please discuss with provider.    STOP taking these medications     cloNIDine (CATAPRES) 0.1 MG tablet      hydrOXYzine (VISTARIL) 25 MG capsule      mirtazapine (REMERON) 15 MG tablet      QUEtiapine (SEROQUEL XR) 50 MG TB24 24 hr tablet         ALLERGIES:   Allergies  Allergen Reactions  . Fentanyl Itching  . Percocet [Oxycodone-Acetaminophen] Hives and Itching    BRIEF HPI:  See H&P, Labs, Consult and Test reports for all details in brief, 45 yo female smoker brought to ER after intentional overdose of klonopin, xanax, alcohol, and possibly anti-histamine. She has hx  of depression/anxiety and was on seroquel, lithium >> not sure if she was compliant with therapy. She was intubated for airway protection  CONSULTATIONS:   pulmonary/intensive care, neurology and psychiatry  PERTINENT RADIOLOGIC STUDIES: Dg Abd 1 View  01/01/2016  CLINICAL DATA:  Nasogastric tube placement.  Initial encounter. EXAM: ABDOMEN - 1 VIEW COMPARISON:  Abdominal radiograph performed 12/29/2015 FINDINGS: The patient's enteric tube is noted ending overlying the body of the stomach. The stomach is largely filled with air. The visualized bowel gas pattern is grossly unremarkable. No free intra-abdominal air is identified, though evaluation for free air is limited on supine views. No acute osseous abnormalities are seen. IMPRESSION: Enteric tube noted ending overlying the body of the stomach. Electronically Signed   By: Roanna Raider M.D.   On: 01/01/2016 01:53   Dg Abd 1 View  12/29/2015  CLINICAL DATA:  OG tube placement. EXAM: ABDOMEN - 1 VIEW COMPARISON:  None. FINDINGS: OG tube tip is in the distal stomach. Bowel gas pattern is normal. No osseous abnormality. IMPRESSION: OG tube tip in good position in the distal stomach. Electronically Signed   By: Francene Boyers M.D.   On: 12/29/2015 10:50   Ct Head Wo Contrast  01/02/2016  CLINICAL DATA:  Status epilepticus. Drug overdose. Fever. Leukocytosis. EXAM: CT HEAD WITHOUT CONTRAST TECHNIQUE: Contiguous axial images were obtained from the base of the skull through the vertex  without intravenous contrast. COMPARISON:  None. FINDINGS: No mass lesion. No midline shift. No acute hemorrhage or hematoma. No extra-axial fluid collections. No evidence of acute infarction. Brain parenchyma is normal. There is partial opacification of the paranasal sinuses. This is most likely chronic. Osseous structures are otherwise normal. IMPRESSION: No acute intracranial abnormality.  Chronic sinus disease. Electronically Signed   By: Francene Boyers M.D.   On:  01/02/2016 12:59   Mr Brain Wo Contrast  01/06/2016  CLINICAL DATA:  Acute encephalopathy. Benzodiazepine overdose. Left upper extremity pain and subjective weakness. EXAM: MRI HEAD WITHOUT CONTRAST TECHNIQUE: Multiplanar, multiecho pulse sequences of the brain and surrounding structures were obtained without intravenous contrast. COMPARISON:  Head CT 01/02/2016 FINDINGS: There is no evidence of acute infarct, intracranial hemorrhage, mass, midline shift, or extra-axial fluid collection. Ventricles and sulci are normal. The brain is normal in signal. Orbits are unremarkable. There is mild circumferential left sphenoid sinus mucosal thickening. Small fluid levels are present in both maxillary sinuses, and there are moderate right and small left mastoid effusions. Major intracranial vascular flow voids are preserved. IMPRESSION: 1. Unremarkable appearance of the brain. 2. Bilateral maxillary sinus fluid. Correlate clinically for acute sinusitis. 3. Right larger than left mastoid effusions. Electronically Signed   By: Sebastian Ache M.D.   On: 01/06/2016 12:49   Dg Chest Port 1 View  01/06/2016  CLINICAL DATA:  45 year old female with respiratory failure EXAM: PORTABLE CHEST 1 VIEW COMPARISON:  Prior chest x-ray 01/05/2016 FINDINGS: Stable position of left upper extremity PICC with the tip overlying the superior cavoatrial junction. Inspiratory volumes are lower today resulting in crowding of the pulmonary vasculature. There is pulmonary venous congestion without overt edema. Mild bibasilar atelectasis. Stable borderline cardiomegaly. No acute osseous abnormality. IMPRESSION: 1. Lower inspiratory volumes with crowding of the pulmonary vasculature and bibasilar atelectasis. 2. Mild vascular congestion without overt edema. 3. Stable position of left upper extremity PICC. Electronically Signed   By: Malachy Moan M.D.   On: 01/06/2016 08:37   Dg Chest Port 1 View  01/05/2016  CLINICAL DATA:  Patient with  respiratory failure. EXAM: PORTABLE CHEST 1 VIEW COMPARISON:  Chest radiograph 01/04/2016. FINDINGS: Left upper extremity PICC line tip projects over the right atrium, unchanged. Interval extubation. Stable cardiac and mediastinal contours. Significant interval improvement and near complete resolution of previously described bilateral diffuse airspace opacities. No pleural effusion or pneumothorax. IMPRESSION: Marked interval improvement in previously described airspace opacities. Interval extubation. Electronically Signed   By: Annia Belt M.D.   On: 01/05/2016 07:43   Dg Chest Port 1 View  01/04/2016  CLINICAL DATA:  Respiratory failure EXAM: PORTABLE CHEST 1 VIEW COMPARISON:  01/03/2016 FINDINGS: Endotracheal tube in good position. Central venous catheter tip in the right atrium unchanged. NG tube in the stomach with the tip not visualized. Diffuse bilateral airspace disease unchanged.  No effusion. IMPRESSION: Support lines remain in good position No significant change diffuse bilateral airspace disease. Electronically Signed   By: Marlan Palau M.D.   On: 01/04/2016 09:25   Dg Chest Port 1 View  01/03/2016  CLINICAL DATA:  Acute respiratory failure, drug overdose, acute encephalopathy. EXAM: PORTABLE CHEST 1 VIEW COMPARISON:  Portable chest x-ray of January 02, 2016 FINDINGS: The lungs are adequately inflated. The interstitial markings remain increased. Areas of confluent alveolar opacity persist but are slightly less conspicuous. The cardiac silhouette remains enlarged. The central pulmonary vascularity remains engorged. The endotracheal tube tip lies 3 cm above the carina. The esophagogastric tube  tip projects below the inferior margin of the image. A left-sided PICC line has been placed since yesterday's study with the tip at or just above the cavoatrial junction. IMPRESSION: Slight interval improvement in the appearance of the pulmonary interstitium but considerable interstitial edema or pneumonia  process. Mild cardiomegaly with mild central pulmonary vascular congestion. The support tubes are in reasonable position though the PICC line tip does lie at or just above the cavoatrial junction. Electronically Signed   By: David  [place]and M.D.   On: 01/03/2016 07:32   Dg Chest Port 1 View  01/02/2016  CLINICAL DATA:  Acute respiratory failure, hypoxemia EXAM: PORTABLE CHEST 1 VIEW COMPARISON:  01/01/2016 and 12/31/2015 FINDINGS: Cardiomediastinal silhouette is stable. Endotracheal tube in place with tip 4 cm above the carina. Stable NG tube position. There is mild interstitial prominence bilaterally with subtle patchy airspace opacities. Findings suspicious for mild interstitial edema. Superimposed patchy alveolar edema or infiltrates cannot be excluded. Clinical correlation is necessary. IMPRESSION: There is mild interstitial prominence bilaterally with subtle patchy airspace opacities. Findings suspicious for mild interstitial edema. Superimposed patchy alveolar edema or infiltrates cannot be excluded. Clinical correlation is necessary. Electronically Signed   By: Natasha Mead M.D.   On: 01/02/2016 10:48   Portable Chest Xray  01/01/2016  CLINICAL DATA:  Central line placement and gastric tube placement. Respiratory distress. EXAM: PORTABLE CHEST 1 VIEW COMPARISON:  01/01/2016 FINDINGS: Endotracheal tube remains in stable position. NG tube is in the stomach. No visible central line. No pneumothorax. Heart is borderline in size with vascular congestion. Left lower lobe atelectasis or infiltrate and possible small left effusion. IMPRESSION: Endotracheal tube and NG tube remain in place, unchanged. No visible central line. Vascular congestion. Left lower lobe atelectasis or infiltrate with possible small left effusion. Electronically Signed   By: Charlett Nose M.D.   On: 01/01/2016 12:12   Dg Chest Port 1 View  01/01/2016  CLINICAL DATA:  Respiratory failure, intubated patient, drug overdose, current smoker. EXAM:  PORTABLE CHEST 1 VIEW COMPARISON:  Portable chest x-ray of December 31, 2015 FINDINGS: The film is obtained in a reverse lordotic projection. The interstitial markings are more conspicuous bilaterally. The cardiac silhouette is mildly enlarged and the central pulmonary vascularity is engorged. The endotracheal tube tip lies 4 cm above the carina. The esophagogastric tube tip projects below the inferior margin of the image. IMPRESSION: Patient positioning today may be influencing the appearance of the pulmonary interstitium and cardiac silhouette. The findings suggest mild CHF. Correlation with the patient's clinical and laboratory values is needed. A repeat well-positioned portable chest x-ray may be useful. Electronically Signed   By: David  [place] M.D.   On: 01/01/2016 07:33   Dg Chest Port 1 View  12/31/2015  CLINICAL DATA:  Acute respiratory failure. EXAM: PORTABLE CHEST 1 VIEW COMPARISON:  12/29/2015 FINDINGS: 1023 hours. Endotracheal tube tip is 2.5 cm above the base of the carina. NG tube tip projects over the proximal stomach with proximal port of the tube at the level of the EG junction. There is some minimal basilar atelectasis. No focal airspace consolidation or overt airspace pulmonary edema. The cardiopericardial silhouette is within normal limits for size. The visualized bony structures of the thorax are intact. Telemetry leads overlie the chest. IMPRESSION: No substantial change in exam. Electronically Signed   By: Kennith Center M.D.   On: 12/31/2015 10:37   Dg Chest Portable 1 View  12/29/2015  CLINICAL DATA:  Post intubation. EXAM: PORTABLE CHEST 1 VIEW  COMPARISON:  Chest radiograph December 29, 2015 at 0132 hours. FINDINGS: Endotracheal tube tip projects 2.2 cm above the carina. Nasogastric tube tip projects in proximal stomach, side port projecting immediately above GE junction. New RIGHT middle lobe consolidation and volume loss, LEFT lung base bandlike density. Stable mild cardiomegaly and  pulmonary vascular congestion. No pneumothorax. Soft tissue planes and included osseous structures are nonsuspicious. IMPRESSION: Endotracheal tube tip projects 2.2 cm above the carina, nasogastric tube in proximal stomach, side port immediately above the GE junction. Mild cardiomegaly and pulmonary vascular congestion. New RIGHT middle lobe, LEFT lung base consolidation may represent atelectasis or pneumonia, less likely confluent edema. Electronically Signed   By: Awilda Metroourtnay  Bloomer M.D.   On: 12/29/2015 05:02   Dg Chest Portable 1 View  12/29/2015  CLINICAL DATA:  45 year old female with altered mental status EXAM: PORTABLE CHEST 1 VIEW COMPARISON:  Radiograph dated 11/27/2015 FINDINGS: Single-view of the chest demonstrates mild cardiomegaly with diffuse interstitial and vascular prominence likely representing a degree of congestive changes. There is no focal consolidation, pleural effusion, or pneumothorax. The osseous structures appear unremarkable. IMPRESSION: Cardiomegaly with mild congestive changes.  No focal consolidation. Electronically Signed   By: Elgie CollardArash  Radparvar M.D.   On: 12/29/2015 02:09   Dg Abd Portable 1v  01/02/2016  CLINICAL DATA:  Ileus, distended abdomen EXAM: PORTABLE ABDOMEN - 1 VIEW COMPARISON:  01/01/2016 FINDINGS: There is normal small bowel gas pattern. NG tube in place with tip in distal stomach. Moderate gaseous distension of the right colon and transverse colon. IMPRESSION: Normal small bowel gas pattern. Moderate gaseous distension of the right and transverse colon. NG tube in place. Electronically Signed   By: Natasha MeadLiviu  Pop M.D.   On: 01/02/2016 10:49   Dg Abd Portable 1v  01/01/2016  CLINICAL DATA:  Nasogastric tube placement EXAM: PORTABLE ABDOMEN - 1 VIEW COMPARISON:  Study obtained earlier in the day. FINDINGS: Nasogastric tube tip and side port in stomach. Stomach is no longer distended. Bowel gas pattern is unremarkable. No obstruction or free air. IMPRESSION: Nasogastric  tube tip and side port in stomach. Bowel gas pattern unremarkable. Electronically Signed   By: Bretta BangWilliam  Woodruff III M.D.   On: 01/01/2016 12:12     PERTINENT LAB RESULTS: CBC: No results for input(s): WBC, HGB, HCT, PLT in the last 72 hours. CMET CMP     Component Value Date/Time   NA 145 01/13/2016 0415   K 3.1* 01/13/2016 0415   CL 107 01/13/2016 0415   CO2 30 01/13/2016 0415   GLUCOSE 154* 01/13/2016 0415   BUN 6 01/13/2016 0415   CREATININE 0.63 01/13/2016 0415   CALCIUM 8.9 01/13/2016 0415   PROT 5.3* 01/13/2016 0415   ALBUMIN 2.6* 01/13/2016 0415   AST 61* 01/13/2016 0415   ALT 110* 01/13/2016 0415   ALKPHOS 59 01/13/2016 0415   BILITOT 0.4 01/13/2016 0415   GFRNONAA >60 01/13/2016 0415   GFRAA >60 01/13/2016 0415    GFR Estimated Creatinine Clearance: 116.1 mL/min (by C-G formula based on Cr of 0.63). No results for input(s): LIPASE, AMYLASE in the last 72 hours.  Recent Labs  01/11/16 0432 01/12/16 0516 01/13/16 0415  CKTOTAL [id number]* 3636* 2355*   Invalid input(s): POCBNP No results for input(s): DDIMER in the last 72 hours. No results for input(s): HGBA1C in the last 72 hours. No results for input(s): CHOL, HDL, LDLCALC, TRIG, CHOLHDL, LDLDIRECT in the last 72 hours. No results for input(s): TSH, T4TOTAL, T3FREE, THYROIDAB in the last 72  hours.  Invalid input(s): FREET3 No results for input(s): VITAMINB12, FOLATE, FERRITIN, TIBC, IRON, RETICCTPCT in the last 72 hours. Coags: No results for input(s): INR in the last 72 hours.  Invalid input(s): PT Microbiology: Recent Results (from the past 240 hour(s))  Culture, Urine     Status: None   Collection Time: 01/07/16  9:03 PM  Result Value Ref Range Status   Specimen Description URINE, RANDOM  Final   Special Requests Normal  Final   Culture MULTIPLE SPECIES PRESENT, SUGGEST RECOLLECTION  Final   Report Status 01/09/2016 FINAL  Final     BRIEF HOSPITAL COURSE:  Acute encephalopathy: Secondary to  intentional drug overdose. Resolved, completely awake and alert.   Drug overdose:Intentional overdose of Klonopin, Xanax, alcohol and possibly antihistamine. Patient has a history of depression with anxiety. She was managed in the intensive care unit, she was intubated on admission. Subsequently, seen by psychiatry,initial recommendations were for inpatient psychiatric admission on discharge. However upon Psychiatry follow up-on 1/14-felt not to require involuntary commitment-patient at this time does NOT want to go for voluntary inpatient treatment. She is requesting discharge and will follow with her own outpatient psychiatrist in the next few days.Psychiatry recommending starting Low dose Lithium and Klonopin.   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  CK slowly down trended to 2355 by day of discharge for a peak of more than 50,000.  Acute hypoxic respiratory failure: Secondary to above. Resolved. Did have brief stridor post extubation, has completed a course of steroids. Now on room air.   Aspiration pneumonia: Completed course of antibiotic. Continue to monitor off antibiotics-doing well-afebrile.   ? Nonconvulsive status epilepticus: Was started on Keppra on admission-spoke with Dr. Angela Nevin on call on 1/11-case discussed over the phone-he advised to discontinue Keppra and monitor.No further seizure like activity since stopping Keppra.   Elevated LFTs: Secondary to Rhabdomyolysis. Improving rapidly-check LFT's at next visit with PCP  Hypokalemia: Replete and recheck at next visit with PCP  GERD: Continue PPI.   TODAY-DAY OF DISCHARGE:  Subjective:   Stacie Sanders today has no headache,no chest abdominal pain,no new weakness tingling or numbness, feels much better wants to go home today. Denies any suicidal or homicidal ideation. Mood is very pleasant  Objective:   Blood pressure 136/69, pulse 78, temperature 97.6  F (36.4 C), temperature source Oral, resp. rate 18, height 5\' 8"  (1.727 m), weight 104.872 kg (231 lb 3.2 oz), SpO2 99 %.  Intake/Output Summary (Last 24 hours) at 01/13/16 1417 Last data filed at 01/13/16 1055  Gross per 24 hour  Intake   1660 ml  Output   3200 ml  Net  -1540 ml   Filed Weights   01/08/16 0511 01/09/16 0549 01/12/16 0443  Weight: 104.8 kg (231 lb 0.7 oz) 104.8 kg (231 lb 0.7 oz) 104.872 kg (231 lb 3.2 oz)    Exam Awake Alert, Oriented *3, No new F.N deficits, Normal affect [place].AT,PERRAL Supple Neck,No JVD, No cervical lymphadenopathy appriciated.  Symmetrical Chest wall movement, Good air movement bilaterally, CTAB RRR,No Gallops,Rubs or new Murmurs, No Parasternal Heave +ve B.Sounds, Abd Soft, Non tender, No organomegaly appriciated, No rebound -guarding or rigidity. No Cyanosis, Clubbing or edema, No new Rash or bruise  DISCHARGE CONDITION: Stable  DISPOSITION: Home  DISCHARGE INSTRUCTIONS:    Activity:  As tolerated with Full fall precautions use walker/cane & assistance as needed  Get Medicines reviewed and adjusted: Please take all your medications with you for your next  visit with your Primary MD  Please request your Primary MD to go over all hospital tests and procedure/radiological results at the follow up, please ask your Primary MD to get all Hospital records sent to his/her office.  If you experience worsening of your admission symptoms, develop shortness of breath, life threatening emergency, suicidal or homicidal thoughts you must seek medical attention immediately by calling 911 or calling your MD immediately  if symptoms less severe.  You must read complete instructions/literature along with all the possible adverse reactions/side effects for all the Medicines you take and that have been prescribed to you. Take any new Medicines after you have completely understood and accpet all the possible adverse reactions/side effects.   Do not drive  when taking Pain medications.   Do not take more than prescribed Pain, Sleep and Anxiety Medications  Special Instructions: If you have smoked or chewed Tobacco  in the last 2 yrs please stop smoking, stop any regular Alcohol  and or any Recreational drug use.  Wear Seat belts while driving.  Please note  You were cared for by a hospitalist during your hospital stay. Once you are discharged, your primary care physician will handle any further medical issues. Please note that NO REFILLS for any discharge medications will be authorized once you are discharged, as it is imperative that you return to your primary care physician (or establish a relationship with a primary care physician if you do not have one) for your aftercare needs so that they can reassess your need for medications and monitor your lab values.   Diet recommendation: Regular Diet  Discharge Instructions    Diet general    Complete by:  As directed      Increase activity slowly    Complete by:  As directed            Follow-up Information    Follow up with  [facility] On 02/04/2016.   Specialty:  Internal Medicine   Why:  1 :45 for hospital follow up    Contact information:   [address] [place] [address] [phone]      Go in 1 week to follow up.   Contact information:   Primary Psychiatrist       Total Time spent on discharge equals  45 minutes.  SignedJeoffrey Massed 01/13/2016 2:17 PM

## 2016-02-04 ENCOUNTER — Ambulatory Visit (INDEPENDENT_AMBULATORY_CARE_PROVIDER_SITE_OTHER): Payer: Self-pay | Admitting: Family Medicine

## 2016-02-04 ENCOUNTER — Encounter: Payer: Self-pay | Admitting: Family Medicine

## 2016-02-04 VITALS — BP 135/84 | HR 78 | Temp 98.2°F | Ht 65.0 in | Wt 235.0 lb

## 2016-02-04 DIAGNOSIS — Z7189 Other specified counseling: Secondary | ICD-10-CM

## 2016-02-04 DIAGNOSIS — Z7689 Persons encountering health services in other specified circumstances: Secondary | ICD-10-CM

## 2016-02-04 NOTE — Progress Notes (Signed)
Patient ID: Stacie Sanders, female   DOB: 06/01/1971, 45 y.o.   MRN: [id number]  Patient presented today to establish care. However, as I get into her interview and history, I find that she is a patient at [facility] the [place] and has an upcoming appointment there.  Her major issues are mental health and she is being followed by [facility]. She dicussed that she could stay with [place] or go back to [facility] as planned.  She has an appointment with [facility] [employer] to see about financial assistance, but she seems doubtful that she will qualify. For this reason she wants to cancel her appointment today and make a decision about where to follow-up based on whether she qualifies for [facility] Financial assistance.

## 2016-02-26 ENCOUNTER — Other Ambulatory Visit: Payer: Self-pay | Admitting: Urology

## 2016-03-05 ENCOUNTER — Encounter (HOSPITAL_BASED_OUTPATIENT_CLINIC_OR_DEPARTMENT_OTHER): Payer: Self-pay | Admitting: *Deleted

## 2016-03-05 NOTE — Progress Notes (Signed)
NPO AFTER MN WITH EXCEPTION CLEAR LIQUIDS UNTIL 0800 (NO CREAM/ MILK PRODUCTS).  ARRIVE AT 1215. NEEDS ISTAT.  CURRENT EKG IN CHART AND EPIC. WILL TAKE AM MEDS DOS W/ SIPS OF WATER .

## 2016-03-10 ENCOUNTER — Encounter (HOSPITAL_BASED_OUTPATIENT_CLINIC_OR_DEPARTMENT_OTHER)
Admission: RE | Disposition: A | Discharge status: Home / Self Care | Payer: Self-pay | Source: Ambulatory Visit | Attending: Urology

## 2016-03-10 ENCOUNTER — Ambulatory Visit (HOSPITAL_BASED_OUTPATIENT_CLINIC_OR_DEPARTMENT_OTHER)
Admission: RE | Admit: 2016-03-10 | Discharge: 2016-03-10 | Disposition: A | Discharge status: Home / Self Care | Payer: Medicaid Other | Source: Ambulatory Visit | Attending: Urology | Admitting: Urology

## 2016-03-10 ENCOUNTER — Ambulatory Visit (HOSPITAL_BASED_OUTPATIENT_CLINIC_OR_DEPARTMENT_OTHER): Payer: Medicaid Other | Admitting: Anesthesiology

## 2016-03-10 ENCOUNTER — Encounter (HOSPITAL_BASED_OUTPATIENT_CLINIC_OR_DEPARTMENT_OTHER): Payer: Self-pay

## 2016-03-10 DIAGNOSIS — F329 Major depressive disorder, single episode, unspecified: Secondary | ICD-10-CM | POA: Diagnosis not present

## 2016-03-10 DIAGNOSIS — Z79899 Other long term (current) drug therapy: Secondary | ICD-10-CM | POA: Diagnosis not present

## 2016-03-10 DIAGNOSIS — N201 Calculus of ureter: Secondary | ICD-10-CM | POA: Diagnosis not present

## 2016-03-10 DIAGNOSIS — Z87442 Personal history of urinary calculi: Secondary | ICD-10-CM | POA: Insufficient documentation

## 2016-03-10 DIAGNOSIS — F17210 Nicotine dependence, cigarettes, uncomplicated: Secondary | ICD-10-CM | POA: Insufficient documentation

## 2016-03-10 DIAGNOSIS — K219 Gastro-esophageal reflux disease without esophagitis: Secondary | ICD-10-CM | POA: Insufficient documentation

## 2016-03-10 DIAGNOSIS — R109 Unspecified abdominal pain: Secondary | ICD-10-CM | POA: Diagnosis present

## 2016-03-10 HISTORY — DX: Other forms of dyspnea: R06.09

## 2016-03-10 HISTORY — DX: Esophageal obstruction: K22.2

## 2016-03-10 HISTORY — DX: Personal history of other diseases of the respiratory system: Z87.09

## 2016-03-10 HISTORY — DX: Allergic rhinitis, unspecified: J30.9

## 2016-03-10 HISTORY — DX: Dyspnea, unspecified: R06.00

## 2016-03-10 HISTORY — DX: Prediabetes: R73.03

## 2016-03-10 HISTORY — DX: Personal history of suicidal behavior: Z91.51

## 2016-03-10 HISTORY — DX: Bipolar disorder, unspecified: F31.9

## 2016-03-10 HISTORY — DX: Personal history of other specified conditions: Z87.898

## 2016-03-10 HISTORY — DX: Irritable bowel syndrome, unspecified: K58.9

## 2016-03-10 HISTORY — DX: Personal history of urinary calculi: Z87.442

## 2016-03-10 HISTORY — DX: Irregular menstruation, unspecified: N92.6

## 2016-03-10 HISTORY — DX: Personal history of self-harm: Z91.5

## 2016-03-10 HISTORY — DX: Generalized anxiety disorder: F41.1

## 2016-03-10 HISTORY — DX: Calculus of kidney: N20.0

## 2016-03-10 HISTORY — PX: CYSTOSCOPY WITH STENT PLACEMENT: SHX5790

## 2016-03-10 HISTORY — PX: CYSTOSCOPY/RETROGRADE/URETEROSCOPY/STONE EXTRACTION WITH BASKET: SHX5317

## 2016-03-10 LAB — POCT I-STAT, CHEM 8
BUN: 14 mg/dL (ref 6–20)
CHLORIDE: 110 mmol/L (ref 101–111)
Calcium, Ion: 1.22 mmol/L (ref 1.12–1.23)
Creatinine, Ser: 0.7 mg/dL (ref 0.44–1.00)
Glucose, Bld: 104 mg/dL — ABNORMAL HIGH (ref 65–99)
HCT: 46 % (ref 36.0–46.0)
Hemoglobin: 15.6 g/dL — ABNORMAL HIGH (ref 12.0–15.0)
Potassium: 3.8 mmol/L (ref 3.5–5.1)
Sodium: 144 mmol/L (ref 135–145)
TCO2: 22 mmol/L (ref 0–100)

## 2016-03-10 SURGERY — CYSTOSCOPY, WITH CALCULUS REMOVAL USING BASKET
Anesthesia: General | Site: Ureter | Laterality: Bilateral

## 2016-03-10 MED ORDER — WHITE PETROLATUM GEL
Status: AC
  Filled 2016-03-10: qty 5

## 2016-03-10 MED ORDER — DEXAMETHASONE SODIUM PHOSPHATE 10 MG/ML IJ SOLN
INTRAMUSCULAR | Status: AC
  Filled 2016-03-10: qty 1

## 2016-03-10 MED ORDER — KETOROLAC TROMETHAMINE 30 MG/ML IJ SOLN
INTRAMUSCULAR | Status: AC
  Filled 2016-03-10: qty 1

## 2016-03-10 MED ORDER — PROMETHAZINE HCL 25 MG/ML IJ SOLN
6.2500 mg | INTRAMUSCULAR | Status: DC | PRN
  Filled 2016-03-10: qty 1

## 2016-03-10 MED ORDER — MIDAZOLAM HCL 2 MG/2ML IJ SOLN
INTRAMUSCULAR | Status: AC
  Filled 2016-03-10: qty 2

## 2016-03-10 MED ORDER — LACTATED RINGERS IV SOLN
INTRAVENOUS | Status: DC
  Administered 2016-03-10: 13:00:00 via INTRAVENOUS
  Filled 2016-03-10: qty 1000

## 2016-03-10 MED ORDER — SULFAMETHOXAZOLE-TRIMETHOPRIM 800-160 MG PO TABS: 1.0000 | ORAL_TABLET | Freq: Two times a day (BID) | ORAL | Status: DC

## 2016-03-10 MED ORDER — TRAMADOL HCL 50 MG PO TABS
ORAL_TABLET | ORAL | Status: AC
  Filled 2016-03-10: qty 1

## 2016-03-10 MED ORDER — PROPOFOL 10 MG/ML IV BOLUS
INTRAVENOUS | Status: DC | PRN
  Administered 2016-03-10: 200 mg via INTRAVENOUS

## 2016-03-10 MED ORDER — MIDAZOLAM HCL 5 MG/5ML IJ SOLN
INTRAMUSCULAR | Status: DC | PRN
  Administered 2016-03-10: 2 mg via INTRAVENOUS

## 2016-03-10 MED ORDER — HYDROMORPHONE HCL 1 MG/ML IJ SOLN
0.2500 mg | INTRAMUSCULAR | Status: DC | PRN
  Administered 2016-03-10: 0.5 mg via INTRAVENOUS
  Filled 2016-03-10: qty 1

## 2016-03-10 MED ORDER — KETOROLAC TROMETHAMINE 30 MG/ML IJ SOLN
INTRAMUSCULAR | Status: DC | PRN
  Administered 2016-03-10: 30 mg via INTRAVENOUS

## 2016-03-10 MED ORDER — TRAMADOL HCL 50 MG PO TABS: 50.0000 mg | ORAL_TABLET | Freq: Four times a day (QID) | ORAL | Status: DC | PRN

## 2016-03-10 MED ORDER — HYDROMORPHONE HCL 1 MG/ML IJ SOLN
INTRAMUSCULAR | Status: AC
  Filled 2016-03-10: qty 1

## 2016-03-10 MED ORDER — CEFAZOLIN SODIUM 1-5 GM-% IV SOLN
1.0000 g | INTRAVENOUS | Status: DC
  Filled 2016-03-10: qty 50

## 2016-03-10 MED ORDER — ONDANSETRON HCL 4 MG/2ML IJ SOLN
INTRAMUSCULAR | Status: DC | PRN
  Administered 2016-03-10: 4 mg via INTRAVENOUS

## 2016-03-10 MED ORDER — LIDOCAINE HCL (CARDIAC) 20 MG/ML IV SOLN
INTRAVENOUS | Status: DC | PRN
  Administered 2016-03-10: 80 mg via INTRAVENOUS

## 2016-03-10 MED ORDER — SODIUM CHLORIDE 0.9 % IR SOLN
Status: DC | PRN
  Administered 2016-03-10: 4000 mL

## 2016-03-10 MED ORDER — CEFAZOLIN SODIUM-DEXTROSE 2-3 GM-% IV SOLR
2.0000 g | INTRAVENOUS | Status: AC
  Administered 2016-03-10: 2 g via INTRAVENOUS
  Filled 2016-03-10: qty 50

## 2016-03-10 MED ORDER — DEXAMETHASONE SODIUM PHOSPHATE 4 MG/ML IJ SOLN
INTRAMUSCULAR | Status: DC | PRN
  Administered 2016-03-10: 10 mg via INTRAVENOUS

## 2016-03-10 MED ORDER — TRAMADOL HCL 50 MG PO TABS
50.0000 mg | ORAL_TABLET | Freq: Four times a day (QID) | ORAL | Status: DC
  Administered 2016-03-10: 50 mg via ORAL
  Filled 2016-03-10: qty 1

## 2016-03-10 MED ORDER — CEFAZOLIN SODIUM-DEXTROSE 2-3 GM-% IV SOLR
INTRAVENOUS | Status: AC
  Filled 2016-03-10: qty 50

## 2016-03-10 MED ORDER — KETOROLAC TROMETHAMINE 30 MG/ML IJ SOLN
30.0000 mg | Freq: Once | INTRAMUSCULAR | Status: DC | PRN
  Filled 2016-03-10: qty 1

## 2016-03-10 MED ORDER — IOHEXOL 350 MG/ML SOLN
INTRAVENOUS | Status: DC | PRN
  Administered 2016-03-10: 12 mL

## 2016-03-10 SURGICAL SUPPLY — 32 items
BAG DRAIN URO-CYSTO SKYTR STRL (DRAIN) ×8 IMPLANT
BASKET DAKOTA 1.9FR 11X120 (BASKET) IMPLANT
BASKET LASER NITINOL 1.9FR (BASKET) IMPLANT
BASKET STONE 1.7 NGAGE (UROLOGICAL SUPPLIES) ×4 IMPLANT
BASKET ZERO TIP NITINOL 2.4FR (BASKET) IMPLANT
CANISTER SUCT LVC 12 LTR MEDI- (MISCELLANEOUS) IMPLANT
CATH INTERMIT  6FR 70CM (CATHETERS) ×4 IMPLANT
CLOTH BEACON ORANGE TIMEOUT ST (SAFETY) ×4 IMPLANT
FIBER LASER FLEXIVA 365 (UROLOGICAL SUPPLIES) IMPLANT
FIBER LASER TRAC TIP (UROLOGICAL SUPPLIES) IMPLANT
GLOVE BIO SURGEON STRL SZ 6.5 (GLOVE) ×4 IMPLANT
GLOVE BIO SURGEON STRL SZ8 (GLOVE) ×4 IMPLANT
GLOVE BIOGEL PI IND STRL 6.5 (GLOVE) ×6 IMPLANT
GLOVE BIOGEL PI INDICATOR 6.5 (GLOVE) ×2
GOWN STRL REUS W/ TWL LRG LVL3 (GOWN DISPOSABLE) ×3 IMPLANT
GOWN STRL REUS W/ TWL XL LVL3 (GOWN DISPOSABLE) ×3 IMPLANT
GOWN STRL REUS W/TWL LRG LVL3 (GOWN DISPOSABLE) ×1
GOWN STRL REUS W/TWL XL LVL3 (GOWN DISPOSABLE) ×1
GUIDEWIRE ANG ZIPWIRE 038X150 (WIRE) ×4 IMPLANT
GUIDEWIRE STR DUAL SENSOR (WIRE) ×4 IMPLANT
IV NS 1000ML (IV SOLUTION) ×1
IV NS 1000ML BAXH (IV SOLUTION) ×3 IMPLANT
IV NS IRRIG 3000ML ARTHROMATIC (IV SOLUTION) ×4 IMPLANT
KIT ROOM TURNOVER WOR (KITS) ×4 IMPLANT
MANIFOLD NEPTUNE II (INSTRUMENTS) ×4 IMPLANT
PACK CYSTO (CUSTOM PROCEDURE TRAY) ×4 IMPLANT
SHEATH ACCESS URETERAL 38CM (SHEATH) ×4 IMPLANT
STENT URET 6FRX24 CONTOUR (STENTS) ×8 IMPLANT
STENT URET 6FRX26 CONTOUR (STENTS) IMPLANT
SYRINGE 10CC LL (SYRINGE) ×4 IMPLANT
TUBE CONNECTING 12X1/4 (SUCTIONS) ×4 IMPLANT
TUBE FEEDING 8FR 16IN STR KANG (MISCELLANEOUS) IMPLANT

## 2016-03-10 NOTE — Brief Op Note (Signed)
03/10/2016  3:26 PM  PATIENT:  Andre Lefortracy L Klecka  45 y.o. female  PRE-OPERATIVE DIAGNOSIS:  BILATERAL RENAL CALCULI  POST-OPERATIVE DIAGNOSIS:  BILATERAL RENAL CALCULI  PROCEDURE:  Procedure(s): CYSTOSCOPY/RETROGRADE/URETEROSCOPY/STONE EXTRACTION WITH BASKET (Bilateral) CYSTOSCOPY WITH STENT PLACEMENT (Bilateral)  SURGEON:  Surgeon(s) and Role:    * Malen GauzePatrick L Marlicia Sroka, MD - Primary  PHYSICIAN ASSISTANT:   ASSISTANTS: none   ANESTHESIA:   general  EBL:  Total I/O In: 500 [I.V.:500] Out: 50 [Urine:50]  BLOOD ADMINISTERED:none  DRAINS: bilateral 6x24 JJ ureteral stents with tethers  LOCAL MEDICATIONS USED:  NONE  SPECIMEN:  Source of Specimen:  bilateral renal calculi  DISPOSITION OF SPECIMEN:  N/A  COUNTS:  YES  TOURNIQUET:  * No tourniquets in log *  DICTATION: .Note written in EPIC  PLAN OF CARE: Discharge to home after PACU  PATIENT DISPOSITION:  PACU - hemodynamically stable.   Delay start of Pharmacological VTE agent (>24hrs) due to surgical blood loss or risk of bleeding: not applicable

## 2016-03-10 NOTE — Anesthesia Preprocedure Evaluation (Addendum)
Anesthesia Evaluation  Patient identified by MRN, date of birth, ID band Patient awake    Reviewed: Allergy & Precautions, NPO status , Patient's Chart, lab work & pertinent test results  Airway Mallampati: II  TM Distance: >3 FB Neck ROM: Full    Dental no notable dental hx. (+) Partial Upper, Partial Lower,    Pulmonary Current Smoker,    Pulmonary exam normal breath sounds clear to auscultation       Cardiovascular negative cardio ROS Normal cardiovascular exam Rhythm:Regular Rate:Normal     Neuro/Psych PSYCHIATRIC DISORDERS Bipolar Disorder 12-29-15 Suicide attempt /OD ICU admission w/ intubationnegative neurological ROS     GI/Hepatic Neg liver ROS, GERD  Medicated,  Endo/Other  Morbid obesity  Renal/GU negative Renal ROS  negative genitourinary   Musculoskeletal negative musculoskeletal ROS (+)   Abdominal   Peds negative pediatric ROS (+)  Hematology negative hematology ROS (+)   Anesthesia Other Findings   Reproductive/Obstetrics negative OB ROS                           Anesthesia Physical Anesthesia Plan  ASA: II  Anesthesia Plan: General   Post-op Pain Management:    Induction: Intravenous  Airway Management Planned: LMA  Additional Equipment:   Intra-op Plan:   Post-operative Plan: Extubation in OR  Informed Consent: I have reviewed the patients History and Physical, chart, labs and discussed the procedure including the risks, benefits and alternatives for the proposed anesthesia with the patient or authorized representative who has indicated his/her understanding and acceptance.   Dental advisory given  Plan Discussed with: CRNA and Surgeon  Anesthesia Plan Comments:         Anesthesia Quick Evaluation

## 2016-03-10 NOTE — Transfer of Care (Signed)
Immediate Anesthesia Transfer of Care Note  Patient: Stacie Sanders  Procedure(s) Performed: Procedure(s): CYSTOSCOPY/RETROGRADE/URETEROSCOPY/STONE EXTRACTION WITH BASKET (Bilateral) CYSTOSCOPY WITH STENT PLACEMENT (Bilateral)  Patient Location: PACU  Anesthesia Type:General  Level of Consciousness: awake, alert , oriented and patient cooperative  Airway & Oxygen Therapy: Patient Spontanous Breathing and Patient connected to nasal cannula oxygen  Post-op Assessment: Report given to RN and Post -op Vital signs reviewed and stable  Post vital signs: Reviewed and stable BP/ 131/78 94-20   SaO2 100%  Last Vitals:  Filed Vitals:   03/10/16 1212  BP: 113/69  Pulse: 82  Temp: 36.9 C  Resp: 20    Complications: No apparent anesthesia complications

## 2016-03-10 NOTE — Anesthesia Procedure Notes (Signed)
Procedure Name: LMA Insertion Date/Time: 03/10/2016 2:43 PM Performed by: Tyrone NineSAUVE, Nalani Andreen F Pre-anesthesia Checklist: Patient identified, Timeout performed, Emergency Drugs available, Suction available and Patient being monitored Patient Re-evaluated:Patient Re-evaluated prior to inductionOxygen Delivery Method: Circle system utilized Preoxygenation: Pre-oxygenation with 100% oxygen Intubation Type: IV induction Ventilation: Mask ventilation without difficulty LMA: LMA inserted LMA Size: 4.0 Number of attempts: 1 Placement Confirmation: positive ETCO2 and breath sounds checked- equal and bilateral Tube secured with: Tape Dental Injury: Teeth and Oropharynx as per pre-operative assessment

## 2016-03-10 NOTE — Anesthesia Postprocedure Evaluation (Signed)
Anesthesia Post Note  Patient: Andre Lefortracy L Mobley  Procedure(s) Performed: Procedure(s) (LRB): CYSTOSCOPY/RETROGRADE/URETEROSCOPY/STONE EXTRACTION WITH BASKET (Bilateral) CYSTOSCOPY WITH STENT PLACEMENT (Bilateral)  Patient location during evaluation: PACU Anesthesia Type: General Level of consciousness: awake and alert Pain management: pain level controlled Vital Signs Assessment: post-procedure vital signs reviewed and stable Respiratory status: spontaneous breathing, nonlabored ventilation, respiratory function stable and patient connected to nasal cannula oxygen Cardiovascular status: blood pressure returned to baseline and stable Postop Assessment: no signs of nausea or vomiting Anesthetic complications: no    Last Vitals:  Filed Vitals:   03/10/16 1212 03/10/16 1539  BP: 113/69 131/78  Pulse: 82 88  Temp: 36.9 C 36.6 C  Resp: 20     Last Pain: There were no vitals filed for this visit.               Reino KentJudd, Mikeal Winstanley J

## 2016-03-10 NOTE — H&P (Signed)
Urology Admission H&P  Chief Complaint: bilateral flank pain  History of Present Illness: [name] is a 45yo with a hx of bilateral flank pain who was found to have bilateral calculi on a noncontrast Ct abd/pelvis. She has constant sharp flank pain worse on the right. No LUTS  Past Medical History  Diagnosis Date  . Depression   . Bipolar 1 disorder (HCC)   . History of suicide attempt     12-29-2015  -- overdose klonopin, xanax, alcohol--  acute respirtory failure and acute encenphalopathy -- both resolved  . GAD (generalized anxiety disorder)   . History of acute respiratory failure     12-29-2015  due to polysubstance overdose--  pt intubated --  resolved   . Allergic rhinitis   . Renal calculi     bilateral  . History of kidney stones     staghorn  . Dyspnea on exertion   . Borderline diabetes mellitus   . History of febrile seizure     x1  01/ 2017--  none since  . Esophageal stricture     scheduled for EGD and dilation 03-18-2016  . IBS (irritable bowel syndrome)   . Irregular menstrual cycle    Past Surgical History  Procedure Laterality Date  . Percutaneous nephrostolithotomy Right 09-18-2005    staghorn  . Tubal ligation  1999  . Extracorporeal shock wave lithotripsy  x2      Home Medications:  Prescriptions prior to admission  Medication Sig Dispense Refill Last Dose  . baclofen (LIORESAL) 20 MG tablet Take 20 mg by mouth as needed for muscle spasms.   Past Month at Unknown time  . Cholecalciferol (VITAMIN D3) 2000 units TABS Take 1 tablet by mouth daily.   03/10/2016 at 0730  . doxepin (SINEQUAN) 10 MG capsule Take 10 mg by mouth at bedtime.   03/09/2016 at 2000  . gabapentin (NEURONTIN) 300 MG capsule Take 300 mg by mouth 2 (two) times daily.   03/09/2016 at 2000  . lithium carbonate 150 MG capsule Take 1 capsule (150 mg total) by mouth at bedtime. (Patient taking differently: Take 150 mg by mouth 2 (two) times daily with a meal. ) 60 capsule 0 03/10/2016 at 0730   . metoCLOPramide (REGLAN) 10 MG tablet Take 10 mg by mouth 4 (four) times daily -  before meals and at bedtime.   03/09/2016 at 1930  . omeprazole (PRILOSEC OTC) 20 MG tablet Take 2 tablets (40 mg total) by mouth daily. (Patient taking differently: Take 20 mg by mouth 2 (two) times daily. ) 60 tablet 0 03/10/2016 at 0730  . PARoxetine (PAXIL) 20 MG tablet Take 20 mg by mouth every morning.    03/10/2016 at 0730   Allergies:  Allergies  Allergen Reactions  . Fentanyl Hives and Itching  . Percocet [Oxycodone-Acetaminophen] Hives and Itching  . Vicodin [Hydrocodone-Acetaminophen] Itching    severe  . Robaxin [Methocarbamol] Rash    History reviewed. No pertinent family history. Social History:  reports that she has been smoking Cigarettes.  She has a 20 pack-year smoking history. She has never used smokeless tobacco. She reports that she does not drink alcohol or use illicit drugs.  Review of Systems  Genitourinary: Positive for flank pain.  All other systems reviewed and are negative.   Physical Exam:  Vital signs in last 24 hours: Temp:  [98.4 F (36.9 C)] 98.4 F (36.9 C) (03/13 1212) Pulse Rate:  [82] 82 (03/13 1212) Resp:  [20] 20 (03/13 1212)  BP: (113)/(69) 113/69 mmHg (03/13 1212) SpO2:  [99 %] 99 % (03/13 1212) Weight:  [100.699 kg (222 lb)] 100.699 kg (222 lb) (03/13 1212) Physical Exam  Constitutional: She is oriented to person, place, and time. She appears well-developed and well-nourished.  HENT:  Head: Normocephalic and atraumatic.  Eyes: EOM are normal. Pupils are equal, round, and reactive to light.  Neck: Normal range of motion. No thyromegaly present.  Cardiovascular: Normal rate and regular rhythm.   Respiratory: Effort normal. No respiratory distress.  GI: Soft. She exhibits no distension.  Musculoskeletal: Normal range of motion.  Neurological: She is alert and oriented to person, place, and time.  Skin: Skin is warm and dry.  Psychiatric: She has a normal  mood and affect. Her behavior is normal. Judgment and thought content normal.    Laboratory Data:  Results for orders placed or performed during the hospital encounter of 03/10/16 (from the past 24 hour(s))  I-STAT, chem 8     Status: Abnormal   Collection Time: 03/10/16  1:10 PM  Result Value Ref Range   Sodium 144 135 - 145 mmol/L   Potassium 3.8 3.5 - 5.1 mmol/L   Chloride 110 101 - 111 mmol/L   BUN 14 6 - 20 mg/dL   Creatinine, Ser 1.61 0.44 - 1.00 mg/dL   Glucose, Bld 096 (H) 65 - 99 mg/dL   Calcium, Ion 0.45 4.09 - 1.23 mmol/L   TCO2 22 0 - 100 mmol/L   Hemoglobin 15.6 (H) 12.0 - 15.0 g/dL   HCT 81.1 91.4 - 78.2 %   No results found for this or any previous visit (from the past 240 hour(s)). Creatinine:  Recent Labs  03/10/16 1310  CREATININE 0.70   Baseline Creatinine: 0.7  Impression/Assessment:  45yo with bilateral renal calculi  Plan:  The risks/benefits/alternatives to bilateral ureteroscopic stone extraction was explained to the patient and she understands and wishes to proceed with surgery  Johnatha Zeidman L 03/10/2016, 2:09 PM

## 2016-03-10 NOTE — Discharge Instructions (Signed)

## 2016-03-11 ENCOUNTER — Encounter (HOSPITAL_BASED_OUTPATIENT_CLINIC_OR_DEPARTMENT_OTHER): Payer: Self-pay | Admitting: Urology

## 2016-03-12 NOTE — Op Note (Signed)
.  Preoperative diagnosis: bilateral ureteral stone  Postoperative diagnosis: Same  Procedure: 1 cystoscopy 2. bilateralretrograde pyelography 3.  Intraoperative fluoroscopy, under one hour, with interpretation 4.  Bilateral ureteroscopic stone manipulation with basket extraction 5.  bilateral 6 x 24 JJ stent exchange  Attending: Wilkie AyePatrick Marinna Blane  Anesthesia: General  Estimated blood loss: None  Drains: bilateral 6 x 24 JJ ureteral stent with tether  Specimens: bilateral renal calculi  Antibiotics: ancef  Findings: bilateral mid and upper pole renal calculi. no hydronephrosis. No masses/lesions in the bladder. Ureteral orifices in normal anatomic location.  Indications: Patient is a 45 year old female/female with a history of bilateral renal calculi. After discussing treatment options, they decided proceed with bilateral ureteroscopic stone manipulation.  Procedure her in detail: The patient was brought to the operating room and a brief timeout was done to ensure correct patient, correct procedure, correct site.  General anesthesia was administered patient was placed in dorsal lithotomy position.  Her genitalia was then prepped and draped in usual sterile fashion.  A rigid 22 French cystoscope was passed in the urethra and the bladder.  Bladder was inspected free masses or lesions.  the ureteral orifices were in the normal orthotopic locations.  a 6 french ureteral catheter was then instilled into the left ureteral orifice.  a gentle retrograde was obtained and findings noted above. We then advanced a zipwire up to the renal pelvis.  we then removed the cystoscope and cannulated the left ureteral orifice with a semirigid ureteroscope.  We located no stone in the ureter. We then placed a sensor wire into the renal pelvis. We then removed the scope and advanced a flexible ureteroscope up to the renal pelvis. We located multiple 1-583mm stones and removed them with an NGage basket. We then placed a  6 x 24 double-j ureteral stent over the original zip wire. We then removed the wire and good coil was noted in the the renal pelvis under fluoroscopy and the bladder under direct vision.  We then turned out attention to the right side. a 6 french ureteral catheter was then instilled into the right ureteral orifice.  a gentle retrograde was obtained and findings noted above. A zipewire was then advanced up to the renal pelvis. we then removed the cystoscope and cannulated the left ureteral orifice with a semirigid ureteroscope.  we then removed the cystoscope and cannulated the right ureteral orifice with a semirigid ureteroscope.  We located no stone in the ureter. We then placed a sensor wire into the renal pelvis. We then removed the scope and advanced a flexible ureteroscope up to the renal pelvis. We located multiple 1-583mm stones and removed them with an NGage basket.  We then placed a 6 x 24 double-j ureteral stent over the original zip wire.  We then removed the wire and good coil was noted in the the renal pelvis under fluoroscopy and the bladder under direct vision. the bladder was then drained and this concluded the procedure which was well tolerated by patient.  Complications: None  Condition: Stable, extubated, transferred to PACU  Plan: Patient is to be discharged home as to follow-up in 2 weeks. She will remove her stents by pulling the tethers in 48 hours

## 2016-03-19 LAB — HM COLONOSCOPY

## 2016-05-19 ENCOUNTER — Encounter (HOSPITAL_COMMUNITY): Payer: Self-pay | Admitting: *Deleted

## 2016-05-19 ENCOUNTER — Emergency Department (HOSPITAL_COMMUNITY)
Admission: EM | Admit: 2016-05-19 | Discharge: 2016-05-19 | Disposition: A | Discharge status: Home / Self Care | Payer: Medicaid Other | Attending: Emergency Medicine | Admitting: Emergency Medicine

## 2016-05-19 DIAGNOSIS — K589 Irritable bowel syndrome without diarrhea: Secondary | ICD-10-CM | POA: Diagnosis not present

## 2016-05-19 DIAGNOSIS — Z8669 Personal history of other diseases of the nervous system and sense organs: Secondary | ICD-10-CM | POA: Diagnosis not present

## 2016-05-19 DIAGNOSIS — Z8742 Personal history of other diseases of the female genital tract: Secondary | ICD-10-CM | POA: Insufficient documentation

## 2016-05-19 DIAGNOSIS — F17210 Nicotine dependence, cigarettes, uncomplicated: Secondary | ICD-10-CM | POA: Diagnosis not present

## 2016-05-19 DIAGNOSIS — Z8709 Personal history of other diseases of the respiratory system: Secondary | ICD-10-CM | POA: Diagnosis not present

## 2016-05-19 DIAGNOSIS — R110 Nausea: Secondary | ICD-10-CM | POA: Insufficient documentation

## 2016-05-19 DIAGNOSIS — Y9389 Activity, other specified: Secondary | ICD-10-CM | POA: Insufficient documentation

## 2016-05-19 DIAGNOSIS — Z915 Personal history of self-harm: Secondary | ICD-10-CM | POA: Diagnosis not present

## 2016-05-19 DIAGNOSIS — W57XXXA Bitten or stung by nonvenomous insect and other nonvenomous arthropods, initial encounter: Secondary | ICD-10-CM | POA: Diagnosis not present

## 2016-05-19 DIAGNOSIS — S1096XA Insect bite of unspecified part of neck, initial encounter: Secondary | ICD-10-CM | POA: Diagnosis present

## 2016-05-19 DIAGNOSIS — Y998 Other external cause status: Secondary | ICD-10-CM | POA: Insufficient documentation

## 2016-05-19 DIAGNOSIS — Z87442 Personal history of urinary calculi: Secondary | ICD-10-CM | POA: Diagnosis not present

## 2016-05-19 DIAGNOSIS — Z79899 Other long term (current) drug therapy: Secondary | ICD-10-CM | POA: Insufficient documentation

## 2016-05-19 DIAGNOSIS — Z792 Long term (current) use of antibiotics: Secondary | ICD-10-CM | POA: Diagnosis not present

## 2016-05-19 DIAGNOSIS — F319 Bipolar disorder, unspecified: Secondary | ICD-10-CM | POA: Insufficient documentation

## 2016-05-19 DIAGNOSIS — Y9289 Other specified places as the place of occurrence of the external cause: Secondary | ICD-10-CM | POA: Diagnosis not present

## 2016-05-19 MED ORDER — DOXYCYCLINE HYCLATE 100 MG PO TABS
200.0000 mg | ORAL_TABLET | Freq: Once | ORAL | Status: AC
  Administered 2016-05-19: 200 mg via ORAL
  Filled 2016-05-19: qty 2

## 2016-05-19 NOTE — ED Provider Notes (Signed)
CSN: [id number]     Arrival date & time 05/19/16  [id number] History   First MD Initiated Contact with Patient 05/19/16 0756     Chief Complaint  Patient presents with  . Insect Bite   HPI  Stacie Sanders is a 45 year old female presenting with a bug bite. Patient states she was bitten by a tick 2 days ago. The bite was on the left side of her posterior neck. Her husband removed the tick with tweezers. She states that her neck has been sore at the site of the tick bite since. She also reports a small pea-sized, tender lump next to the bug bite. She denies warmth or drainage from the bug bite or lump. She reports generalized myalgias and nausea over the past 24 hours. Denies vomiting or abdominal pain. Reports a history of IBS and has had diarrhea but states this is not unusual for her. Denies using over-the-counter medications or creams on the tick bite. Denies rashes, fevers, headache, dizziness, syncope, shortness of breath, chest pain, weakness or any other complaints today. She does not have a PCP.   Past Medical History  Diagnosis Date  . Depression   . Bipolar 1 disorder (HCC)   . History of suicide attempt     12-29-2015  -- overdose klonopin, xanax, alcohol--  acute respirtory failure and acute encenphalopathy -- both resolved  . GAD (generalized anxiety disorder)   . History of acute respiratory failure     12-29-2015  due to polysubstance overdose--  pt intubated --  resolved   . Allergic rhinitis   . Renal calculi     bilateral  . History of kidney stones     staghorn  . Dyspnea on exertion   . Borderline diabetes mellitus   . History of febrile seizure     x1  01/ 2017--  none since  . Esophageal stricture     scheduled for EGD and dilation 03-18-2016  . IBS (irritable bowel syndrome)   . Irregular menstrual cycle    Past Surgical History  Procedure Laterality Date  . Percutaneous nephrostolithotomy Right 09-18-2005    staghorn  . Tubal ligation  1999  . Extracorporeal shock  wave lithotripsy  x2    . Cystoscopy/retrograde/ureteroscopy/stone extraction with basket Bilateral 03/10/2016    Procedure: CYSTOSCOPY/RETROGRADE/URETEROSCOPY/STONE EXTRACTION WITH BASKET;  Surgeon: Malen GauzePatrick L McKenzie, MD;  [facility] [place];  Service: Urology;  Laterality: Bilateral;  . Cystoscopy with stent placement Bilateral 03/10/2016    Procedure: CYSTOSCOPY WITH STENT PLACEMENT;  Surgeon: Malen GauzePatrick L McKenzie, MD;  [facility] [place];  Service: Urology;  Laterality: Bilateral;   History reviewed. No pertinent family history. Social History  Substance Use Topics  . Smoking status: Current Every Day Smoker -- 1.00 packs/day for 20 years    Types: Cigarettes  . Smokeless tobacco: Never Used  . Alcohol Use: No   OB History    No data available     Review of Systems  All other systems reviewed and are negative.     Allergies  Fentanyl; Percocet; and Robaxin  Home Medications   Prior to Admission medications   Medication Sig Start Date End Date Taking? Authorizing Provider  baclofen (LIORESAL) 20 MG tablet Take 20 mg by mouth as needed for muscle spasms.    Historical Provider, MD  Cholecalciferol (VITAMIN D3) 2000 units TABS Take 1 tablet by mouth daily.    Historical Provider, MD  doxepin (SINEQUAN) 10 MG capsule Take 10 mg by  mouth at bedtime.    Historical Provider, MD  gabapentin (NEURONTIN) 300 MG capsule Take 300 mg by mouth 2 (two) times daily.    Historical Provider, MD  lithium carbonate 150 MG capsule Take 1 capsule (150 mg total) by mouth at bedtime. Patient taking differently: Take 150 mg by mouth 2 (two) times daily with a meal.  01/13/16   Maretta Bees, MD  metoCLOPramide (REGLAN) 10 MG tablet Take 10 mg by mouth 4 (four) times daily -  before meals and at bedtime.    Historical Provider, MD  omeprazole (PRILOSEC OTC) 20 MG tablet Take 2 tablets (40 mg total) by mouth daily. Patient taking differently: Take 20 mg by mouth 2  (two) times daily.  01/13/16   Shanker Levora Dredge, MD  PARoxetine (PAXIL) 20 MG tablet Take 20 mg by mouth every morning.     Historical Provider, MD  sulfamethoxazole-trimethoprim (BACTRIM DS,SEPTRA DS) 800-160 MG tablet Take 1 tablet by mouth 2 (two) times daily. 03/10/16   Malen Gauze, MD  traMADol (ULTRAM) 50 MG tablet Take 1 tablet (50 mg total) by mouth every 6 (six) hours as needed. 03/10/16   Malen Gauze, MD   BP 113/73 mmHg  Pulse 68  Temp(Src) 98.6 F (37 C) (Oral)  Resp 18  Ht 5\' 5"  (1.651 m)  Wt 92.987 kg  BMI 34.11 kg/m2  SpO2 99%  LMP 05/05/2016 Physical Exam  Constitutional: She is oriented to person, place, and time. She appears well-developed and well-nourished. No distress.  Nontoxic-appearing  HENT:  Head: Normocephalic and atraumatic.  Eyes: Conjunctivae are normal. Right eye exhibits no discharge. Left eye exhibits no discharge. No scleral icterus.  Neck: Normal range of motion. Neck supple.  No rigidity. Full range of motion of neck intact.  Cardiovascular: Normal rate, regular rhythm and normal heart sounds.   Pulmonary/Chest: Effort normal and breath sounds normal. No respiratory distress.  Abdominal: Soft. She exhibits no distension. There is no tenderness.  Musculoskeletal: Normal range of motion.  Moves all extremities spontaneously. Walks with a steady gait.  Lymphadenopathy:       Head (left side): Occipital adenopathy present.    She has no cervical adenopathy.    She has no axillary adenopathy.       Right: No supraclavicular adenopathy present.       Left: No supraclavicular adenopathy present.  Small, mobile, tender left occipital lymph node enlargement directly adjacent to insect bite site. No overlying erythema, warmth, induration or fluctuance. No right-sided occipital adenopathy. No submandibular, preauricular, postauricular, cervical, axillary or supraclavicular adenopathy.  Neurological: She is alert and oriented to person, place,  and time. Coordination normal.  Cranial nerves grossly intact. 5/5 strength of grip and ankle. Sensation grossly intact to light touch. Walks with a coordinated gait.  Skin: Skin is warm and dry. There is erythema.  Insect bite noted to the left posterior neck with 0.5 mm surrounding erythema. No warmth, fluctuance, induration or drainage from the site. No streaking. No targetoid lesions or other rashes noted to head, trunk or extremities.  Psychiatric: She has a normal mood and affect. Her behavior is normal.  Nursing note and vitals reviewed.   ED Course  Procedures (including critical care time) Labs Review Labs Reviewed - No data to display  Imaging Review No results found. I have personally reviewed and evaluated these images and lab results as part of my medical decision-making.   EKG Interpretation None      MDM  Final diagnoses:  Tick bite   45 year old female presenting with tick bite. Insect bite occurred 2 days ago and was removed by her husband. Currently complaining of myalgias and nausea. Afebrile and hemodynamically stable. Patient is nontoxic-appearing. Insect bite with small amount of overlying erythema noted to the left posterior neck. No drainage, induration, fluctuance or streaking. Small reactive lymph node noted in occipital region. Full range of motion of neck intact without rigidity. No targetoid lesions noted to head, trunk or extremities. No other rashes noted. Benign physical exam. Given tick bite within past 72 hours, will treat prophylactically with doxycycline. Discussed with patient that serological testing is not indicated at this time and patient will need to follow-up with her PCP for further monitoring of symptoms. Given referral information for community health and wellness. Return precautions given in discharge paperwork and discussed with pt at bedside. Pt stable for discharge    Alveta Heimlich, PA-C 05/19/16 0945  Bethann Berkshire, MD 05/20/16  1414

## 2016-05-19 NOTE — Discharge Instructions (Signed)
Tick Bite Information Ticks are insects that attach themselves to the skin and draw blood for food. There are various types of ticks. Common types include wood ticks and deer ticks. Most ticks live in shrubs and grassy areas. Ticks can climb onto your body when you make contact with leaves or grass where the tick is waiting. The most common places on the body for ticks to attach themselves are the scalp, neck, armpits, waist, and groin. Most tick bites are harmless, but sometimes ticks carry germs that cause diseases. These germs can be spread to a person during the tick's feeding process. The chance of a disease spreading through a tick bite depends on:   The type of tick.  Time of year.   How long the tick is attached.   Geographic location.  HOW CAN YOU PREVENT TICK BITES? Take these steps to help prevent tick bites when you are outdoors:  Wear protective clothing. Long sleeves and long pants are best.   Wear white clothes so you can see ticks more easily.  Tuck your pant legs into your socks.   If walking on a trail, stay in the middle of the trail to avoid brushing against bushes.  Avoid walking through areas with long grass.  Put insect repellent on all exposed skin and along boot tops, pant legs, and sleeve cuffs.   Check clothing, hair, and skin repeatedly and before going inside.   Brush off any ticks that are not attached.  Take a shower or bath as soon as possible after being outdoors.  WHAT IS THE PROPER WAY TO REMOVE A TICK? Ticks should be removed as soon as possible to help prevent diseases caused by tick bites. 1. If latex gloves are available, put them on before trying to remove a tick.  2. Using fine-point tweezers, grasp the tick as close to the skin as possible. You may also use curved forceps or a tick removal tool. Grasp the tick as close to its head as possible. Avoid grasping the tick on its body. 3. Pull gently with steady upward pressure until  the tick lets go. Do not twist the tick or jerk it suddenly. This may break off the tick's head or mouth parts. 4. Do not squeeze or crush the tick's body. This could force disease-carrying fluids from the tick into your body.  5. After the tick is removed, wash the bite area and your hands with soap and water or other disinfectant such as alcohol. 6. Apply a small amount of antiseptic cream or ointment to the bite site.  7. Wash and disinfect any instruments that were used.  Do not try to remove a tick by applying a hot match, petroleum jelly, or fingernail polish to the tick. These methods do not work and may increase the chances of disease being spread from the tick bite.  WHEN SHOULD YOU SEEK MEDICAL CARE? Contact your health care provider if you are unable to remove a tick from your skin or if a part of the tick breaks off and is stuck in the skin.  After a tick bite, you need to be aware of signs and symptoms that could be related to diseases spread by ticks. Contact your health care provider if you develop any of the following in the days or weeks after the tick bite:  Unexplained fever.  Rash. A circular rash that appears days or weeks after the tick bite may indicate the possibility of Lyme disease. The rash may resemble   a target with a bull's-eye and may occur at a different part of your body than the tick bite.  Redness and swelling in the area of the tick bite.   Tender, swollen lymph glands.   Diarrhea.   Weight loss.   Cough.   Fatigue.   Muscle, joint, or bone pain.   Abdominal pain.   Headache.   Lethargy or a change in your level of consciousness.  Difficulty walking or moving your legs.   Numbness in the legs.   Paralysis.  Shortness of breath.   Confusion.   Repeated vomiting.    This information is not intended to replace advice given to you by your health care provider. Make sure you discuss any questions you have with your health  care provider.   Document Released: 12/12/2000 Document Revised: 01/05/2015 Document Reviewed: 05/25/2013 Elsevier Interactive Patient Education 2016 [employer]  

## 2016-05-19 NOTE — ED Notes (Signed)
Pt reports finding a tick this weekend on Lt side of neck. Pt 's husband removed tick . Pt now reports generalized body aches and knot at bite site.

## 2016-05-19 NOTE — ED Notes (Signed)
Declined W/C at D/C and was escorted to lobby by RN. 

## 2016-09-17 ENCOUNTER — Emergency Department (HOSPITAL_COMMUNITY)
Admission: EM | Admit: 2016-09-17 | Discharge: 2016-09-18 | Disposition: A | Discharge status: Home / Self Care | Payer: Medicaid Other | Attending: Emergency Medicine | Admitting: Emergency Medicine

## 2016-09-17 ENCOUNTER — Encounter (HOSPITAL_COMMUNITY): Payer: Self-pay | Admitting: Emergency Medicine

## 2016-09-17 ENCOUNTER — Emergency Department (HOSPITAL_COMMUNITY): Payer: Medicaid Other

## 2016-09-17 DIAGNOSIS — R0602 Shortness of breath: Secondary | ICD-10-CM | POA: Diagnosis present

## 2016-09-17 DIAGNOSIS — R6889 Other general symptoms and signs: Secondary | ICD-10-CM

## 2016-09-17 DIAGNOSIS — J111 Influenza due to unidentified influenza virus with other respiratory manifestations: Secondary | ICD-10-CM | POA: Diagnosis not present

## 2016-09-17 DIAGNOSIS — F17210 Nicotine dependence, cigarettes, uncomplicated: Secondary | ICD-10-CM | POA: Insufficient documentation

## 2016-09-17 LAB — CBC
HEMATOCRIT: 46.6 % — AB (ref 36.0–46.0)
HEMOGLOBIN: 15 g/dL (ref 12.0–15.0)
MCH: 31.1 pg (ref 26.0–34.0)
MCHC: 32.2 g/dL (ref 30.0–36.0)
MCV: 96.5 fL (ref 78.0–100.0)
Platelets: 262 10*3/uL (ref 150–400)
RBC: 4.83 MIL/uL (ref 3.87–5.11)
RDW: 16.3 % — ABNORMAL HIGH (ref 11.5–15.5)
WBC: 13.7 10*3/uL — ABNORMAL HIGH (ref 4.0–10.5)

## 2016-09-17 LAB — BASIC METABOLIC PANEL
ANION GAP: 9 (ref 5–15)
BUN: 7 mg/dL (ref 6–20)
CALCIUM: 9.1 mg/dL (ref 8.9–10.3)
CHLORIDE: 108 mmol/L (ref 101–111)
CO2: 22 mmol/L (ref 22–32)
Creatinine, Ser: 0.79 mg/dL (ref 0.44–1.00)
GFR calc Af Amer: >60 mL/min (ref >60)
GFR calc non Af Amer: >60 mL/min (ref >60)
GLUCOSE: 176 mg/dL — AB (ref 65–99)
Potassium: 3.9 mmol/L (ref 3.5–5.1)
Sodium: 139 mmol/L (ref 135–145)

## 2016-09-17 LAB — I-STAT BETA HCG BLOOD, ED (MC, WL, AP ONLY): I-stat hCG, quantitative: <5 m[IU]/mL (ref <5)

## 2016-09-17 LAB — I-STAT TROPONIN, ED: TROPONIN I, POC: 0 ng/mL (ref 0.00–0.08)

## 2016-09-17 MED ORDER — KETOROLAC TROMETHAMINE 30 MG/ML IJ SOLN
30.0000 mg | Freq: Once | INTRAMUSCULAR | Status: AC
  Administered 2016-09-18: 30 mg via INTRAVENOUS
  Filled 2016-09-17: qty 1

## 2016-09-17 MED ORDER — ALBUTEROL SULFATE (2.5 MG/3ML) 0.083% IN NEBU
2.5000 mg | INHALATION_SOLUTION | Freq: Once | RESPIRATORY_TRACT | Status: AC
  Administered 2016-09-18: 2.5 mg via RESPIRATORY_TRACT
  Filled 2016-09-17: qty 3

## 2016-09-17 MED ORDER — IPRATROPIUM-ALBUTEROL 0.5-2.5 (3) MG/3ML IN SOLN
3.0000 mL | Freq: Once | RESPIRATORY_TRACT | Status: AC
  Administered 2016-09-18: 3 mL via RESPIRATORY_TRACT
  Filled 2016-09-17: qty 3

## 2016-09-17 MED ORDER — ONDANSETRON HCL 4 MG/2ML IJ SOLN
4.0000 mg | Freq: Once | INTRAMUSCULAR | Status: AC
  Administered 2016-09-18: 4 mg via INTRAVENOUS
  Filled 2016-09-17: qty 2

## 2016-09-17 MED ORDER — IPRATROPIUM-ALBUTEROL 0.5-2.5 (3) MG/3ML IN SOLN
3.0000 mL | Freq: Once | RESPIRATORY_TRACT | Status: AC
  Administered 2016-09-17: 3 mL via RESPIRATORY_TRACT

## 2016-09-17 MED ORDER — SODIUM CHLORIDE 0.9 % IV BOLUS (SEPSIS)
1000.0000 mL | Freq: Once | INTRAVENOUS | Status: AC
  Administered 2016-09-18: 1000 mL via INTRAVENOUS

## 2016-09-17 MED ORDER — ALBUTEROL SULFATE (2.5 MG/3ML) 0.083% IN NEBU
INHALATION_SOLUTION | RESPIRATORY_TRACT | Status: AC
  Filled 2016-09-17: qty 6

## 2016-09-17 MED ORDER — ALBUTEROL SULFATE (2.5 MG/3ML) 0.083% IN NEBU
5.0000 mg | INHALATION_SOLUTION | Freq: Once | RESPIRATORY_TRACT | Status: AC
  Administered 2016-09-17: 5 mg via RESPIRATORY_TRACT

## 2016-09-17 MED ORDER — MORPHINE SULFATE (PF) 4 MG/ML IV SOLN
4.0000 mg | Freq: Once | INTRAVENOUS | Status: AC
  Administered 2016-09-18: 4 mg via INTRAVENOUS
  Filled 2016-09-17: qty 1

## 2016-09-17 MED ORDER — IPRATROPIUM-ALBUTEROL 0.5-2.5 (3) MG/3ML IN SOLN
RESPIRATORY_TRACT | Status: AC
  Filled 2016-09-17: qty 3

## 2016-09-17 NOTE — ED Notes (Signed)
Pt states she is having worsening cp triage RN aware

## 2016-09-17 NOTE — ED Provider Notes (Signed)
[facility]-EMERGENCY DEPT Provider Note   CSN: [id number] Arrival date & time: 09/17/16  1717     History   Chief Complaint Chief Complaint  Patient presents with  . Shortness of Breath  . Diarrhea  . Cough    HPI Stacie Sanders is a 45 y.o. female.  HPI   Stacie Sanders is a 45 year old female with PMH of depression and bipolar disorder that presents to the ED with progressively worsening cough x2 days. Cough has been productive of yellow green phlegm. Denies hemoptysis. Tried OTC medications with no relief. States chest started hurting after all of her coughing. Reports several episodes of posttussive emesis. No hematemesis. Also reports that her "body hurts all over" and her back hurts. Was incontinent of stool once and bladder several times during coughing episodes. Has been having episodes of non bloody diarrhea. No abdominal pain. Fever at home has been 101. Hasn't taken anything for fever. No sick contacts or travel.   She is also feeling light headed and dizzy, reports decreased PO intake.   N0 nausea, le swelling, neck pain, SOB, confusion, change of vision, focal weakness  Past Medical History:  Diagnosis Date  . Allergic rhinitis   . Bipolar 1 disorder (HCC)   . Borderline diabetes mellitus   . Depression   . Dyspnea on exertion   . Esophageal stricture    scheduled for EGD and dilation 03-18-2016  . GAD (generalized anxiety disorder)   . History of acute respiratory failure    12-29-2015  due to polysubstance overdose--  pt intubated --  resolved   . History of febrile seizure    x1  01/ 2017--  none since  . History of kidney stones    staghorn  . History of suicide attempt    12-29-2015  -- overdose klonopin, xanax, alcohol--  acute respirtory failure and acute encenphalopathy -- both resolved  . IBS (irritable bowel syndrome)   . Irregular menstrual cycle   . Renal calculi    bilateral    Patient Active Problem List   Diagnosis Date Noted  . Acute respiratory  failure (HCC) 01/03/2016  . Acute respiratory failure with hypoxemia (HCC)   . Acute encephalopathy   . Drug overdose 12/29/2015  . Overdose 12/29/2015    Past Surgical History:  Procedure Laterality Date  . CYSTOSCOPY WITH STENT PLACEMENT Bilateral 03/10/2016   Procedure: CYSTOSCOPY WITH STENT PLACEMENT;  Surgeon: Malen GauzePatrick L McKenzie, MD;  [facility];  Service: Urology;  Laterality: Bilateral;  . CYSTOSCOPY/RETROGRADE/URETEROSCOPY/STONE EXTRACTION WITH BASKET Bilateral 03/10/2016   Procedure: CYSTOSCOPY/RETROGRADE/URETEROSCOPY/STONE EXTRACTION WITH BASKET;  Surgeon: Malen GauzePatrick L McKenzie, MD;  [facility] [place];  Service: Urology;  Laterality: Bilateral;  . EXTRACORPOREAL SHOCK WAVE LITHOTRIPSY  x2    . PERCUTANEOUS NEPHROSTOLITHOTOMY Right 09-18-2005   staghorn  . TUBAL LIGATION  1999    OB History    No data available       Home Medications    Prior to Admission medications   Medication Sig Start Date End Date Taking? Authorizing Provider  Cholecalciferol (VITAMIN D3) 2000 units TABS Take 1 tablet by mouth daily.   Yes Historical Provider, MD  gabapentin (NEURONTIN) 300 MG capsule Take 300 mg by mouth 2 (two) times daily.   Yes Historical Provider, MD  omeprazole (PRILOSEC) 40 MG capsule Take 40 mg by mouth daily.   Yes Historical Provider, MD  PARoxetine (PAXIL) 30 MG tablet Take 30 mg by mouth daily.  Yes Historical Provider, MD  QUEtiapine (SEROQUEL) 100 MG tablet Take 100 mg by mouth at bedtime. 07/17/16  Yes Historical Provider, MD  Thiamine HCl (VITAMIN B-1 PO) Take 1 tablet by mouth daily.   Yes Historical Provider, MD  lithium carbonate 150 MG capsule Take 1 capsule (150 mg total) by mouth at bedtime. Patient not taking: Reported on 09/17/2016 01/13/16   Maretta Bees, MD  omeprazole (PRILOSEC OTC) 20 MG tablet Take 2 tablets (40 mg total) by mouth daily. Patient not taking: Reported on 09/17/2016 01/13/16   Maretta Bees, MD   ondansetron (ZOFRAN) 4 MG tablet Take 1 tablet (4 mg total) by mouth every 6 (six) hours. 09/18/16   Myiah Petkus Neva Seat, PA-C  oseltamivir (TAMIFLU) 75 MG capsule Take 1 capsule (75 mg total) by mouth every 12 (twelve) hours. 09/18/16   Daesean Lazarz Neva Seat, PA-C  sulfamethoxazole-trimethoprim (BACTRIM DS,SEPTRA DS) 800-160 MG tablet Take 1 tablet by mouth 2 (two) times daily. Patient not taking: Reported on 09/17/2016 03/10/16   Malen Gauze, MD  traMADol (ULTRAM) 50 MG tablet Take 1 tablet (50 mg total) by mouth every 6 (six) hours as needed. Patient not taking: Reported on 09/17/2016 03/10/16   Malen Gauze, MD  traMADol (ULTRAM) 50 MG tablet Take 1 tablet (50 mg total) by mouth every 6 (six) hours as needed. 09/18/16   Marlon Pel, PA-C    Family History History reviewed. No pertinent family history.  Social History Social History  Substance Use Topics  . Smoking status: Current Every Day Smoker    Packs/day: 1.00    Years: 20.00    Types: Cigarettes  . Smokeless tobacco: Never Used  . Alcohol use No     Allergies   Fentanyl; Percocet [oxycodone-acetaminophen]; and Robaxin [methocarbamol]   Review of Systems Review of Systems  Review of Systems All other systems negative except as documented in the HPI. All pertinent positives and negatives as reviewed in the HPI.  Physical Exam Updated Vital Signs BP 121/93   Pulse 94   Temp 98.4 F (36.9 C) (Oral)   Resp 22   LMP  (LMP Unknown)   SpO2 96%   Physical Exam  Constitutional: She appears well-developed and well-nourished. She does not have a sickly appearance. She appears ill. No distress.  HENT:  Head: Normocephalic and atraumatic.  Eyes: Conjunctivae are normal. Pupils are equal, round, and reactive to light.  Neck: Trachea normal, normal range of motion and full passive range of motion without pain. Neck supple.  Cardiovascular: Normal rate, regular rhythm and normal pulses.   Pulmonary/Chest: Effort normal. No  accessory muscle usage. No apnea. No respiratory distress. She has wheezes. Chest wall is not dull to percussion. She exhibits no tenderness, no crepitus, no edema, no deformity and no retraction.  Abdominal: Soft. Normal appearance and bowel sounds are normal. She exhibits no distension and no ascites. There is no tenderness. There is no rigidity, no guarding and no CVA tenderness.  Musculoskeletal: Normal range of motion.  Neurological: She is alert. She has normal strength.  Skin: Skin is warm, dry and intact.  Psychiatric: She has a normal mood and affect. Her speech is normal and behavior is normal. Judgment and thought content normal. Cognition and memory are normal.     ED Treatments / Results  Labs (all labs ordered are listed, but only abnormal results are displayed) Labs Reviewed  BASIC METABOLIC PANEL - Abnormal; Notable for the following:       Result Value  Glucose, Bld 176 (*)    All other components within normal limits  CBC - Abnormal; Notable for the following:    WBC 13.7 (*)    HCT 46.6 (*)    RDW 16.3 (*)    All other components within normal limits  I-STAT TROPOININ, ED  I-STAT BETA HCG BLOOD, ED ([facility], [facility], [facility] ONLY)    EKG  EKG Interpretation  Date/Time:  Wednesday September 17 2016 17:32:02 EDT Ventricular Rate:  101 PR Interval:  142 QRS Duration: 76 QT Interval:  344 QTC Calculation: 446 R Axis:   77 Text Interpretation:  Sinus tachycardia Otherwise normal ECG No significant change since last tracing Confirmed by FLOYD MD, DANIEL [phone]) on 09/17/2016 11:15:28 PM       Radiology Dg Chest 2 View  Result Date: 09/17/2016 CLINICAL DATA:  Cough and shortness of breath EXAM: CHEST  2 VIEW COMPARISON:  01/06/2016 chest radiograph. FINDINGS: Stable cardiomediastinal silhouette with normal heart size. No pneumothorax. No pleural effusion. Lungs appear clear, with no acute consolidative airspace disease and no pulmonary edema. IMPRESSION: No active  cardiopulmonary disease. Electronically Signed   By: Delbert Phenix M.D.   On: 09/17/2016 17:59    Procedures Procedures (including critical care time)  Medications Ordered in ED Medications  sodium chloride 0.9 % bolus 1,000 mL (1,000 mLs Intravenous New Bag/Given 09/18/16 0216)  oseltamivir (TAMIFLU) capsule 75 mg (not administered)  ipratropium-albuterol (DUONEB) 0.5-2.5 (3) MG/3ML nebulizer solution 3 mL (3 mLs Nebulization Given 09/17/16 1732)  albuterol (PROVENTIL) (2.5 MG/3ML) 0.083% nebulizer solution 5 mg (5 mg Nebulization Given 09/17/16 1923)  sodium chloride 0.9 % bolus 1,000 mL (0 mLs Intravenous Stopped 09/18/16 0253)  morphine 4 MG/ML injection 4 mg (4 mg Intravenous Given 09/18/16 0003)  ketorolac (TORADOL) 30 MG/ML injection 30 mg (30 mg Intravenous Given 09/18/16 0003)  ipratropium-albuterol (DUONEB) 0.5-2.5 (3) MG/3ML nebulizer solution 3 mL (3 mLs Nebulization Given 09/18/16 0003)  albuterol (PROVENTIL) (2.5 MG/3ML) 0.083% nebulizer solution 2.5 mg (2.5 mg Nebulization Given 09/18/16 0003)  sodium chloride 0.9 % bolus 1,000 mL (0 mLs Intravenous Stopped 09/18/16 0200)  ondansetron (ZOFRAN) injection 4 mg (4 mg Intravenous Given 09/18/16 0010)  acetaminophen (TYLENOL) tablet 650 mg (650 mg Oral Given 09/18/16 0010)  ondansetron (ZOFRAN) injection 4 mg (4 mg Intravenous Given 09/18/16 0045)     Initial Impression / Assessment and Plan / ED Course  I have reviewed the triage vital signs and the nursing notes.  Pertinent labs & imaging results that were available during my care of the patient were reviewed by me and considered in my medical decision making (see chart for details).  Clinical Course    Patient with symptoms consistent with influenza.  Vitals are stable, low-grade fever.  No signs of dehydration, tolerating PO's.  Lungs are clear. Due to patient's presentation and physical exam a chest x-ray was not ordered bc likely diagnosis of flu.  Discussed the cost versus benefit  of Tamiflu treatment with the patient.  The patient understands that symptoms are greater than the recommended 24-48 hour window of treatment.  Patient will be discharged with instructions to orally hydrate, rest, and use over-the-counter medications such as anti-inflammatories ibuprofen and Aleve for muscle aches and Tylenol for fever.      Final Clinical Impressions(s) / ED Diagnoses   Final diagnoses:  Flu-like symptoms    New Prescriptions New Prescriptions   ONDANSETRON (ZOFRAN) 4 MG TABLET    Take 1 tablet (4 mg total) by mouth every  6 (six) hours.   OSELTAMIVIR (TAMIFLU) 75 MG CAPSULE    Take 1 capsule (75 mg total) by mouth every 12 (twelve) hours.   TRAMADOL (ULTRAM) 50 MG TABLET    Take 1 tablet (50 mg total) by mouth every 6 (six) hours as needed.     Marlon Pel, PA-C 09/18/16 0300    Tomasita Crumble, MD 09/18/16 [phone]

## 2016-09-17 NOTE — ED Triage Notes (Signed)
Pt sts cough and SOB with diarrhea; pt sts started yesteday

## 2016-09-18 MED ORDER — SODIUM CHLORIDE 0.9 % IV BOLUS (SEPSIS)
1000.0000 mL | Freq: Once | INTRAVENOUS | Status: AC
  Administered 2016-09-18: 1000 mL via INTRAVENOUS

## 2016-09-18 MED ORDER — ACETAMINOPHEN 325 MG PO TABS
650.0000 mg | ORAL_TABLET | Freq: Once | ORAL | Status: AC
  Administered 2016-09-18: 650 mg via ORAL
  Filled 2016-09-18: qty 2

## 2016-09-18 MED ORDER — ONDANSETRON HCL 4 MG PO TABS: 4.0000 mg | ORAL_TABLET | Freq: Four times a day (QID) | ORAL | 0 refills | Status: DC

## 2016-09-18 MED ORDER — ONDANSETRON HCL 4 MG/2ML IJ SOLN
4.0000 mg | Freq: Once | INTRAMUSCULAR | Status: AC
  Administered 2016-09-18: 4 mg via INTRAVENOUS
  Filled 2016-09-18: qty 2

## 2016-09-18 MED ORDER — TRAMADOL HCL 50 MG PO TABS: 50.0000 mg | ORAL_TABLET | Freq: Four times a day (QID) | ORAL | 0 refills | Status: DC | PRN

## 2016-09-18 MED ORDER — OSELTAMIVIR PHOSPHATE 75 MG PO CAPS
75.0000 mg | ORAL_CAPSULE | Freq: Once | ORAL | Status: AC
  Administered 2016-09-18: 75 mg via ORAL
  Filled 2016-09-18: qty 1

## 2016-09-18 MED ORDER — OSELTAMIVIR PHOSPHATE 75 MG PO CAPS: 75.0000 mg | ORAL_CAPSULE | Freq: Two times a day (BID) | ORAL | 0 refills | Status: DC

## 2016-09-18 NOTE — ED Notes (Signed)
Pt attempted to ambulate from bed to door. Pt was shaking and unable to do so at the time. Wishes to wait a few moments and will try again. O2-92%

## 2016-09-18 NOTE — ED Notes (Signed)
Patient vomiting again, verbal order for zofran placed. Patient states "that was my fault, I'm so hungry and tried to eat some of my sub since the nausea eased up." RN stated to her that she was not supposed to eat anything and she needs to hold off from trying again until PA sts okay.

## 2016-09-18 NOTE — ED Notes (Signed)
Patient verbalized understanding of discharge instructions and denies any further needs or questions at this time. VS stable. Patient ambulatory with steady gait, escorted to ED entrance in wheelchair.   

## 2017-08-20 ENCOUNTER — Emergency Department (HOSPITAL_COMMUNITY): Payer: No Typology Code available for payment source

## 2017-08-20 ENCOUNTER — Emergency Department (HOSPITAL_COMMUNITY)
Admission: EM | Admit: 2017-08-20 | Discharge: 2017-08-20 | Disposition: A | Discharge status: Home / Self Care | Payer: No Typology Code available for payment source | Attending: Emergency Medicine | Admitting: Emergency Medicine

## 2017-08-20 ENCOUNTER — Encounter (HOSPITAL_COMMUNITY): Payer: Self-pay

## 2017-08-20 DIAGNOSIS — F17210 Nicotine dependence, cigarettes, uncomplicated: Secondary | ICD-10-CM | POA: Insufficient documentation

## 2017-08-20 DIAGNOSIS — F319 Bipolar disorder, unspecified: Secondary | ICD-10-CM | POA: Insufficient documentation

## 2017-08-20 DIAGNOSIS — Z79899 Other long term (current) drug therapy: Secondary | ICD-10-CM | POA: Diagnosis not present

## 2017-08-20 DIAGNOSIS — S6991XA Unspecified injury of right wrist, hand and finger(s), initial encounter: Secondary | ICD-10-CM | POA: Diagnosis present

## 2017-08-20 DIAGNOSIS — Y9389 Activity, other specified: Secondary | ICD-10-CM | POA: Diagnosis not present

## 2017-08-20 DIAGNOSIS — S62342A Nondisplaced fracture of base of third metacarpal bone, right hand, initial encounter for closed fracture: Secondary | ICD-10-CM | POA: Diagnosis not present

## 2017-08-20 DIAGNOSIS — Y92410 Unspecified street and highway as the place of occurrence of the external cause: Secondary | ICD-10-CM | POA: Diagnosis not present

## 2017-08-20 DIAGNOSIS — Y998 Other external cause status: Secondary | ICD-10-CM | POA: Insufficient documentation

## 2017-08-20 MED ORDER — IBUPROFEN 800 MG PO TABS
800.0000 mg | ORAL_TABLET | Freq: Once | ORAL | Status: AC
  Administered 2017-08-20: 800 mg via ORAL
  Filled 2017-08-20: qty 1

## 2017-08-20 MED ORDER — OXYCODONE-ACETAMINOPHEN 5-325 MG PO TABS: 1.0000 | ORAL_TABLET | ORAL | 0 refills | Status: DC | PRN

## 2017-08-20 NOTE — ED Notes (Signed)
Ortho Tech at bedside.  
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
## 2017-08-20 NOTE — ED Notes (Signed)
ED Provider at bedside. 

## 2017-08-20 NOTE — Discharge Instructions (Signed)
Please read attached information. If you experience any new or worsening signs or symptoms please return to the emergency room for evaluation. Please follow-up with your primary care provider or specialist as discussed. Please use medication prescribed only as directed and discontinue taking if you have any concerning signs or symptoms.   °

## 2017-08-20 NOTE — ED Notes (Signed)
Patient transported to X-ray 

## 2017-08-20 NOTE — ED Notes (Signed)
Pt verbalized understanding of discharge instructions and denies any further questions at this time.   

## 2017-08-20 NOTE — Progress Notes (Signed)
Orthopedic Tech Progress Note Patient Details:  Stacie Sanders March 03, 1971 [id number]  Ortho Devices Type of Ortho Device: Ace wrap, Volar splint Ortho Device/Splint Location: rue Ortho Device/Splint Interventions: Application   [name] 08/20/2017, 2:16 PM

## 2017-10-06 ENCOUNTER — Emergency Department (HOSPITAL_COMMUNITY)
Admission: EM | Admit: 2017-10-06 | Discharge: 2017-10-06 | Disposition: A | Discharge status: Home / Self Care | Payer: Self-pay | Attending: Emergency Medicine | Admitting: Emergency Medicine

## 2017-10-06 ENCOUNTER — Emergency Department (HOSPITAL_COMMUNITY): Payer: Self-pay

## 2017-10-06 ENCOUNTER — Encounter (HOSPITAL_COMMUNITY): Payer: Self-pay | Admitting: *Deleted

## 2017-10-06 DIAGNOSIS — F17210 Nicotine dependence, cigarettes, uncomplicated: Secondary | ICD-10-CM | POA: Insufficient documentation

## 2017-10-06 DIAGNOSIS — M79641 Pain in right hand: Secondary | ICD-10-CM | POA: Insufficient documentation

## 2017-10-06 DIAGNOSIS — Z79899 Other long term (current) drug therapy: Secondary | ICD-10-CM | POA: Insufficient documentation

## 2017-10-06 MED ORDER — TRAMADOL HCL 50 MG PO TABS: 50.0000 mg | ORAL_TABLET | Freq: Four times a day (QID) | ORAL | 0 refills | Status: DC | PRN

## 2017-10-06 MED ORDER — CYCLOBENZAPRINE HCL 10 MG PO TABS: 10.0000 mg | ORAL_TABLET | Freq: Two times a day (BID) | ORAL | 0 refills | Status: DC | PRN
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
## 2017-10-06 NOTE — Progress Notes (Signed)
Orthopedic Tech Progress Note Patient Details:  Stacie Sanders 1971-06-09 [id number]  Ortho Devices Type of Ortho Device: Ace wrap, Volar splint Ortho Device/Splint Location: rue Ortho Device/Splint Interventions: Application   Tanor Glaspy 10/06/2017, 12:41 PM

## 2017-10-06 NOTE — Discharge Instructions (Signed)
Please read attached information regarding your condition. Wear splint as directed until followed up with orthopedist. Take tramadol and Flexeril as needed as directed. Return to ED for additional injury, numbness and arm, chest pain, falls, redness or swelling of joints.
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
## 2017-10-06 NOTE — ED Notes (Signed)
Patient transported to X-ray 

## 2017-12-10 ENCOUNTER — Encounter (HOSPITAL_COMMUNITY): Payer: Self-pay | Admitting: Emergency Medicine

## 2017-12-10 ENCOUNTER — Emergency Department (HOSPITAL_COMMUNITY): Payer: Self-pay

## 2017-12-10 ENCOUNTER — Emergency Department (HOSPITAL_COMMUNITY)
Admission: EM | Admit: 2017-12-10 | Discharge: 2017-12-10 | Disposition: A | Discharge status: Home / Self Care | Payer: Self-pay | Attending: Emergency Medicine | Admitting: Emergency Medicine

## 2017-12-10 ENCOUNTER — Other Ambulatory Visit: Payer: Self-pay

## 2017-12-10 DIAGNOSIS — F17210 Nicotine dependence, cigarettes, uncomplicated: Secondary | ICD-10-CM | POA: Insufficient documentation

## 2017-12-10 DIAGNOSIS — R0789 Other chest pain: Secondary | ICD-10-CM | POA: Insufficient documentation

## 2017-12-10 DIAGNOSIS — Z79899 Other long term (current) drug therapy: Secondary | ICD-10-CM | POA: Insufficient documentation

## 2017-12-10 LAB — BASIC METABOLIC PANEL
Anion gap: 10 (ref 5–15)
BUN: 16 mg/dL (ref 6–20)
CHLORIDE: 106 mmol/L (ref 101–111)
CO2: 21 mmol/L — AB (ref 22–32)
CREATININE: 0.91 mg/dL (ref 0.44–1.00)
Calcium: 9.2 mg/dL (ref 8.9–10.3)
GFR calc Af Amer: >60 mL/min (ref >60)
GFR calc non Af Amer: >60 mL/min (ref >60)
GLUCOSE: 87 mg/dL (ref 65–99)
Potassium: 3.8 mmol/L (ref 3.5–5.1)
Sodium: 137 mmol/L (ref 135–145)

## 2017-12-10 LAB — CBC
HCT: 39.8 % (ref 36.0–46.0)
Hemoglobin: 13.3 g/dL (ref 12.0–15.0)
MCH: 31.6 pg (ref 26.0–34.0)
MCHC: 33.4 g/dL (ref 30.0–36.0)
MCV: 94.5 fL (ref 78.0–100.0)
PLATELETS: 311 10*3/uL (ref 150–400)
RBC: 4.21 MIL/uL (ref 3.87–5.11)
RDW: 13.4 % (ref 11.5–15.5)
WBC: 16.8 10*3/uL — ABNORMAL HIGH (ref 4.0–10.5)

## 2017-12-10 LAB — I-STAT TROPONIN, ED: Troponin i, poc: 0 ng/mL (ref 0.00–0.08)

## 2017-12-10 LAB — I-STAT BETA HCG BLOOD, ED (MC, WL, AP ONLY): I-stat hCG, quantitative: <5 m[IU]/mL (ref <5)

## 2017-12-10 MED ORDER — LIDOCAINE 5 % EX PTCH: 1.0000 | MEDICATED_PATCH | CUTANEOUS | 0 refills | Status: DC

## 2017-12-10 NOTE — Discharge Instructions (Signed)
Continue taking over-the-counter Naproxen as prescribed on the bottle as an antiinflammatory. You may use Lidoderm patches as needed for persistent pain. Ice areas of pain 3-4 times per day to limit swelling as well. Avoid strenuous activity or heavy lifting, especially with your right hand. Follow up with a primary care doctor.

## 2017-12-10 NOTE — ED Notes (Signed)
Pt here with complaints of right sided chest pain that radiated down her right room. Reports sudden SOB with pain. Denies N/V or fevers. Endorses cough for multiple weeks that she has been coughing up phlegm.

## 2017-12-10 NOTE — ED Provider Notes (Signed)
[facility] EMERGENCY DEPARTMENT Provider Note   CSN: [id number] Arrival date & time: 12/10/17  1658     History   Chief Complaint Chief Complaint  Patient presents with  . Chest Pain    HPI Stacie Sanders is a 46 y.o. female.  46 year old female with a history of depression, bipolar 1 disorder, anxiety disorder, borderline diabetes mellitus, and IBS presents to the emergency department for chest pain. She reports coughing over the past few days. Cough productive of green phlegm for which she has been taking Mucinex. She notes fixing her vacuum today when she had sudden onset of a sharp pain to her right upper chest which radiated to her right shoulder. It was aggravated with movement and unrelieved by Naproxen. She notes some subjective SOB with this pain. Patient denies any fall, trauma, injury. She is RHD. No associated fever, syncope, near syncope, leg swelling, N/V, recent surgeries, recent hospitalizations, overlying skin changes, nipple discharge or inversion. No prior hx of ACS and patient denies family history of this.      Past Medical History:  Diagnosis Date  . Allergic rhinitis   . Bipolar 1 disorder (HCC)   . Borderline diabetes mellitus   . Depression   . Dyspnea on exertion   . Esophageal stricture    scheduled for EGD and dilation 03-18-2016  . GAD (generalized anxiety disorder)   . History of acute respiratory failure    12-29-2015  due to polysubstance overdose--  pt intubated --  resolved   . History of febrile seizure    x1  01/ 2017--  none since  . History of kidney stones    staghorn  . History of suicide attempt    12-29-2015  -- overdose klonopin, xanax, alcohol--  acute respirtory failure and acute encenphalopathy -- both resolved  . IBS (irritable bowel syndrome)   . Irregular menstrual cycle   . Renal calculi    bilateral    Patient Active Problem List   Diagnosis Date Noted  . Acute respiratory failure (HCC) 01/03/2016   . Acute respiratory failure with hypoxemia (HCC)   . Acute encephalopathy   . Drug overdose 12/29/2015  . Overdose 12/29/2015    Past Surgical History:  Procedure Laterality Date  . CYSTOSCOPY WITH STENT PLACEMENT Bilateral 03/10/2016   Procedure: CYSTOSCOPY WITH STENT PLACEMENT;  Surgeon: Malen Gauze, MD;  Location: [facility] [place];  Service: Urology;  Laterality: Bilateral;  . CYSTOSCOPY/RETROGRADE/URETEROSCOPY/STONE EXTRACTION WITH BASKET Bilateral 03/10/2016   Procedure: CYSTOSCOPY/RETROGRADE/URETEROSCOPY/STONE EXTRACTION WITH BASKET;  Surgeon: Malen Gauze, MD;  [facility];  Service: Urology;  Laterality: Bilateral;  . EXTRACORPOREAL SHOCK WAVE LITHOTRIPSY  x2    . PERCUTANEOUS NEPHROSTOLITHOTOMY Right 09-18-2005   staghorn  . TUBAL LIGATION  1999    OB History    No data available       Home Medications    Prior to Admission medications   Medication Sig Start Date End Date Taking? Authorizing Provider  gabapentin (NEURONTIN) 400 MG capsule Take 400 mg by mouth 3 (three) times daily.    Yes [provider]  naproxen (NAPROSYN) 250 MG tablet Take 750 mg by mouth as needed.   Yes [provider]  omeprazole (PRILOSEC OTC) 20 MG tablet Take 2 tablets (40 mg total) by mouth daily. 01/13/16  Yes Ghimire, Werner Lean, MD  PARoxetine (PAXIL) 40 MG tablet Take 40 mg by mouth daily.    Yes [provider]  QUEtiapine (SEROQUEL) 100 MG tablet Take 200 mg by mouth at bedtime.  07/17/16  Yes [provider]  topiramate (TOPAMAX) 50 MG tablet Take 50 mg by mouth 2 (two) times daily.    Yes [provider]  cyclobenzaprine (FLEXERIL) 10 MG tablet Take 1 tablet (10 mg total) by mouth 2 (two) times daily as needed for muscle spasms. Patient not taking: Reported on 12/10/2017 10/06/17   Dietrich PatesKhatri, Hina, PA-C  lidocaine (LIDODERM) 5 % Place 1 patch onto the skin daily. Remove & Discard patch within 12 hours  or as directed by MD 12/10/17   Antony MaduraHumes, Kyng Matlock, PA-C  lithium carbonate 150 MG capsule Take 1 capsule (150 mg total) by mouth at bedtime. Patient not taking: Reported on 09/17/2016 01/13/16   Maretta BeesGhimire, Shanker M, MD  ondansetron (ZOFRAN) 4 MG tablet Take 1 tablet (4 mg total) by mouth every 6 (six) hours. Patient not taking: Reported on 10/06/2017 09/18/16   Marlon PelGreene, Tiffany, PA-C  oxyCODONE-acetaminophen (PERCOCET/ROXICET) 5-325 MG tablet Take 1 tablet by mouth every 4 (four) hours as needed for severe pain. Patient not taking: Reported on 10/06/2017 08/20/17   Eyvonne MechanicHedges, Jeffrey, PA-C  traMADol (ULTRAM) 50 MG tablet Take 1 tablet (50 mg total) by mouth every 6 (six) hours as needed. Patient not taking: Reported on 12/10/2017 10/06/17   Dietrich PatesKhatri, Hina, PA-C    Family History No family history on file.  Social History Social History   Tobacco Use  . Smoking status: Current Every Day Smoker    Packs/day: 0.50    Years: 20.00    Pack years: 10.00    Types: Cigarettes  . Smokeless tobacco: Never Used  Substance Use Topics  . Alcohol use: Yes    Comment: occ  . Drug use: No     Allergies   Fentanyl; Percocet [oxycodone-acetaminophen]; and Robaxin [methocarbamol]   Review of Systems Review of Systems Ten systems reviewed and are negative for acute change, except as noted in the HPI.    Physical Exam Updated Vital Signs BP 114/69   Pulse 80   Temp 98.5 F (36.9 C) (Oral)   Resp 15   LMP 09/10/2017 (Approximate)   SpO2 98%   Physical Exam  Constitutional: She is oriented to person, place, and time. She appears well-developed and well-nourished. No distress.  Nontoxic appearing and in NAD  HENT:  Head: Normocephalic and atraumatic.  Eyes: Conjunctivae and EOM are normal. No scleral icterus.  Neck: Normal range of motion.  Cardiovascular: Normal rate, regular rhythm and intact distal pulses.  Pulmonary/Chest: Effort normal. No stridor. No respiratory distress. She has no wheezes. She  exhibits tenderness.  TTP to the right upper lateral chest wall. No crepitus, deformity, induration, erythema, heat to touch.    Musculoskeletal: Normal range of motion.  No BLE edema.  Neurological: She is alert and oriented to person, place, and time. She exhibits normal muscle tone. Coordination normal.  GCS 15. Patient moving all extremities.  Skin: Skin is warm and dry. No rash noted. She is not diaphoretic. No erythema. No pallor.  Psychiatric: She has a normal mood and affect. Her behavior is normal.  Nursing note and vitals reviewed.    ED Treatments / Results  Labs (all labs ordered are listed, but only abnormal results are displayed) Labs Reviewed  BASIC METABOLIC PANEL - Abnormal; Notable for the following components:      Result Value   CO2 21 (*)    All other components within normal limits  CBC -  Abnormal; Notable for the following components:   WBC 16.8 (*)    All other components within normal limits  I-STAT TROPONIN, ED  I-STAT BETA HCG BLOOD, ED ([facility], [facility], [facility] ONLY)    EKG  EKG Interpretation  Date/Time:  Thursday December 10 2017 17:00:58 EST Ventricular Rate:  88 PR Interval:  146 QRS Duration: 80 QT Interval:  372 QTC Calculation: 450 R Axis:   62 Text Interpretation:  Normal sinus rhythm Normal ECG agree. no change from previous Confirmed by Arby BarrettePfeiffer, Marcy [id number]) on 12/10/2017 9:30:03 PM       Radiology Dg Chest 2 View  Result Date: 12/10/2017 CLINICAL DATA:  Chest pain and cough EXAM: CHEST  2 VIEW COMPARISON:  Chest radiograph 09/17/2016 FINDINGS: The heart size and mediastinal contours are within normal limits. Both lungs are clear. The visualized skeletal structures are unremarkable. IMPRESSION: No active cardiopulmonary disease. Electronically Signed   By: Deatra RobinsonKevin  Herman M.D.   On: 12/10/2017 17:47    Procedures Procedures (including critical care time)  Medications Ordered in ED Medications - No data to display   Initial Impression /  Assessment and Plan / ED Course  I have reviewed the triage vital signs and the nursing notes.  Pertinent labs & imaging results that were available during my care of the patient were reviewed by me and considered in my medical decision making (see chart for details).     46 year old female presents to the emergency department for evaluation of chest pain.  Pain is located to the right upper lateral chest, near the axilla.  It is reproducible on palpation.  Patient reports aggravation with movement of her arm.  Suspect musculoskeletal etiology.  Cardiac workup today is reassuring and EKG is nonischemic.  Chest x-ray also shows no evidence of acute cardiopulmonary abnormality.   Patient is PERC negative; doubt PE and symptoms are atypical for this.  Leukocytosis is chronic per chart review.  We will plan for continued supportive management and outpatient primary care follow up. Patient advised to continue Naproxen. Rx for Lidoderm patches given for pain control.  Opiates avoided given hx of polysubstance overdose.  Return precautions discussed and provided. Patient discharged in stable condition with no unaddressed concerns.   Final Clinical Impressions(s) / ED Diagnoses   Final diagnoses:  Chest wall pain    ED Discharge Orders        Ordered    lidocaine (LIDODERM) 5 %  Every 24 hours     12/10/17 2120       Antony MaduraHumes, Severina Sykora, PA-C 12/11/17 0024    Arby BarrettePfeiffer, Marcy, MD 12/11/17 [phone]

## 2017-12-11 ENCOUNTER — Other Ambulatory Visit (HOSPITAL_COMMUNITY): Payer: Self-pay | Admitting: *Deleted

## 2017-12-11 DIAGNOSIS — N644 Mastodynia: Secondary | ICD-10-CM

## 2017-12-15 ENCOUNTER — Ambulatory Visit (HOSPITAL_COMMUNITY): Payer: Self-pay

## 2017-12-15 ENCOUNTER — Ambulatory Visit (HOSPITAL_COMMUNITY)
Admission: RE | Admit: 2017-12-15 | Discharge: 2017-12-15 | Disposition: A | Discharge status: Home / Self Care | Payer: Self-pay | Source: Ambulatory Visit | Attending: Obstetrics and Gynecology | Admitting: Obstetrics and Gynecology

## 2017-12-15 ENCOUNTER — Encounter (HOSPITAL_COMMUNITY): Payer: Self-pay

## 2017-12-15 VITALS — BP 130/80 | Temp 98.4°F | Ht 65.0 in | Wt 232.0 lb

## 2017-12-15 DIAGNOSIS — Z01419 Encounter for gynecological examination (general) (routine) without abnormal findings: Secondary | ICD-10-CM

## 2017-12-15 DIAGNOSIS — N644 Mastodynia: Secondary | ICD-10-CM

## 2017-12-15 NOTE — Patient Instructions (Addendum)
Explained breast self awareness with Andre Lefortracy L Mcgann. Let patient know that if today's Pap smear is normal that her next Pap smear will be due in one year due to history of abnormal Pap smear without at least three normal Pap smears in a row. [facility] and possible bilateral breast ultrasounds. Appointment scheduled for Tuesday, December 15, 2017 at 0920. Patient aware of appointment and will be there. Let patient know will follow up with her within the next couple weeks with results of Pap smear by letter or phone. Discussed smoking cessation with patient. Referred [facility] the [facility] and gave resources to free smoking cessation classes at [facility]. Andre Lefortracy L Clanton verbalized understanding.  Kamille Toomey, Kathaleen Maserhristine Poll, RN 8:29 AM

## 2017-12-15 NOTE — Progress Notes (Signed)
Complaints of bilateral breast lumps and pain. Patient stated her breasts have always been tender but for the past two months have been painful. Patient states the pain is constant and rates at a 3 out of 10. Patient stated the pain is worse when touched or lifting her arms.   Pap Smear: Pap smear completed today. Last Pap smear was 3 years ago and normal per patient. Per patient has a history of an abnormal Pap smear 10 years ago that she did not follow-up. No Pap smear results are in Epic.  Physical exam: Breasts Breasts symmetrical. Redness observed under bilateral breasts that per patient is where her bra rubs. No nipple retraction bilateral breasts. No nipple discharge bilateral breasts. No lymphadenopathy. No lumps palpated bilateral breasts. Complaints of diffuse bilateral breast pain on exam. [facility] for [facility] diagnostic mamm[facility] and possible bilateral breast ultrasounds. Appointment scheduled for Tuesday, December 15, 2017 at 0920.  Pelvic/Bimanual   Ext Genitalia No lesions, no swelling and no discharge observed on external genitalia.         Vagina Vagina pink and normal texture. No lesions or discharge observed in vagina.          Cervix Cervix is present. Cervix pink and of normal texture. No discharge observed.     Uterus Uterus is present and palpable. Uterus in normal position and normal size.        Adnexae Bilateral ovaries present and palpable. No tenderness on palpation.          Rectovaginal No rectal exam completed today since patient had no rectal complaints. No skin abnormalities observed on exam.    Smoking History: Patient is a current smoker. Discussed smoking cessation with patient. Referred to the [facility] and gave resources to free smoking cessation classes at [facility].  Patient Navigation: Patient education provided. [facility] services provided [facility].

## 2017-12-16 ENCOUNTER — Encounter (HOSPITAL_COMMUNITY): Payer: Self-pay | Admitting: *Deleted

## 2017-12-17 ENCOUNTER — Other Ambulatory Visit: Payer: Self-pay

## 2017-12-17 ENCOUNTER — Ambulatory Visit
Admission: RE | Admit: 2017-12-17 | Discharge: 2017-12-17 | Disposition: A | Discharge status: Home / Self Care | Payer: No Typology Code available for payment source | Source: Ambulatory Visit | Attending: Obstetrics and Gynecology | Admitting: Obstetrics and Gynecology

## 2017-12-17 ENCOUNTER — Ambulatory Visit: Payer: Self-pay

## 2017-12-17 DIAGNOSIS — N644 Mastodynia: Secondary | ICD-10-CM

## 2017-12-17 LAB — CYTOLOGY - PAP
DIAGNOSIS: NEGATIVE
HPV: NOT DETECTED

## 2017-12-26 ENCOUNTER — Telehealth (HOSPITAL_COMMUNITY): Payer: Self-pay | Admitting: *Deleted

## 2017-12-26 NOTE — Telephone Encounter (Signed)
Telephoned patient at home number and advised patient of negative pap smear results. HPV was negative. Next pap smear due in one due to history of abnormal. Patient voiced understanding.

## 2018-02-10 NOTE — Progress Notes (Signed)
Subjective:    Patient ID: Stacie Sanders, female    DOB: [birth year], 47 y.o.   MRN: [id number]  HPI:  Stacie Sanders is here to establish as a new pt.  She is a pleasant 47 year old female.  PMH: depression, bipolar 1 disorder, anxiety disorder,impaired glucose intolerance, IBS, and hx of intentional overdose of klonopin, xanax, alcohol, and possibly anti-histamine 11/2015.  Her 53 year old grandson drowned in her home pool and it caused severe/acute depression.  She currently denies thoughts of harming herself/others and is followed by psychiatry every 2-3 months. She vehemently declines CBT referral, states "I don't want to have to tell my story over again". She smokes pack/day, since age 68 She rarely uses ETOH and has lost 13 lbs in last three weeks with reducing CHO/sugar intake She does not work outside of the house, she has applied/been denies for disability and is considering re-applying. She has two children living with her ages 30 and 4 She has two acute complaints 1) Increased non productive cough for last 4 days 2) OD redness, foreign body sensation, excessive tearing and woke up with crusted eyelids this am  Patient Care Team    Relationship Specialty Notifications Start End  William Hamburger D, NP PCP - General Family Medicine  02/15/18   Gastroenterology, [facility] Sprang    02/15/18   [facility]  Family Medicine  02/15/18     Patient Active Problem List   Diagnosis Date Noted  . Healthcare maintenance 02/15/2018  . Depression 02/15/2018  . Bipolar 1 disorder (HCC) 02/15/2018  . Cough in adult 02/15/2018  . Acute bacterial conjunctivitis of right eye 02/15/2018  . Acute respiratory failure (HCC) 01/03/2016  . Acute respiratory failure with hypoxemia (HCC)   . Acute encephalopathy   . Drug overdose 12/29/2015  . Overdose 12/29/2015     Past Medical History:  Diagnosis Date  . Allergic rhinitis   . Bipolar 1 disorder (HCC)   . Borderline diabetes  mellitus   . Colon polyp   . Depression   . Dyspnea on exertion   . Esophageal stricture    scheduled for EGD and dilation 03-18-2016  . GAD (generalized anxiety disorder)   . History of acute respiratory failure    12-29-2015  due to polysubstance overdose--  pt intubated --  resolved   . History of febrile seizure    x1  01/ 2017--  none since  . History of kidney stones    staghorn  . History of suicide attempt    12-29-2015  -- overdose klonopin, xanax, alcohol--  acute respirtory failure and acute encenphalopathy -- both resolved  . IBS (irritable bowel syndrome)   . Irregular menstrual cycle   . Renal calculi    bilateral     Past Surgical History:  Procedure Laterality Date  . COLONOSCOPY    . CYSTOSCOPY WITH STENT PLACEMENT Bilateral 03/10/2016   Procedure: CYSTOSCOPY WITH STENT PLACEMENT;  Surgeon: Malen Gauze, MD;  Location: [facility];  Service: Urology;  Laterality: Bilateral;  . CYSTOSCOPY/RETROGRADE/URETEROSCOPY/STONE EXTRACTION WITH BASKET Bilateral 03/10/2016   Procedure: CYSTOSCOPY/RETROGRADE/URETEROSCOPY/STONE EXTRACTION WITH BASKET;  Surgeon: Malen Gauze, MD;  Location: [facility];  Service: Urology;  Laterality: Bilateral;  . EXTRACORPOREAL SHOCK WAVE LITHOTRIPSY  x2    . PERCUTANEOUS NEPHROSTOLITHOTOMY Right 09-18-2005   staghorn  . TUBAL LIGATION  1999     Family History  Problem Relation Age of Onset  . Cancer Father  pancreas  . Melanoma Father   . Cancer Maternal Grandmother        lung  . COPD Paternal Grandmother   . Melanoma Paternal Grandmother      Social History   Substance and Sexual Activity  Drug Use No     Social History   Substance and Sexual Activity  Alcohol Use Yes   Comment: occ     Social History   Tobacco Use  Smoking Status Current Every Day Smoker  . Packs/day: 0.50  . Years: 20.00  . Pack years: 10.00  . Types: Cigarettes  Smokeless Tobacco Never Used      Outpatient Encounter Medications as of 02/15/2018  Medication Sig  . gabapentin (NEURONTIN) 400 MG capsule Take 400 mg by mouth 3 (three) times daily.   . hydrOXYzine (VISTARIL) 25 MG capsule Take 25 mg by mouth 3 (three) times daily as needed.  . naproxen (NAPROSYN) 250 MG tablet Take 750 mg by mouth as needed.  Marland Kitchen. omeprazole (PRILOSEC OTC) 20 MG tablet Take 2 tablets (40 mg total) by mouth daily.  . ondansetron (ZOFRAN) 4 MG tablet Take 1 tablet (4 mg total) by mouth every 6 (six) hours.  Marland Kitchen. PARoxetine (PAXIL) 40 MG tablet Take 40 mg by mouth daily.   . QUEtiapine (SEROQUEL) 100 MG tablet Take 200 mg by mouth at bedtime.   . topiramate (TOPAMAX) 50 MG tablet Take 50 mg by mouth 2 (two) times daily.   . ciprofloxacin (CILOXAN) 0.3 % ophthalmic ointment Place into the right eye 3 (three) times daily.  . [DISCONTINUED] cyclobenzaprine (FLEXERIL) 10 MG tablet Take 1 tablet (10 mg total) by mouth 2 (two) times daily as needed for muscle spasms. (Patient not taking: Reported on 12/10/2017)  . [DISCONTINUED] lidocaine (LIDODERM) 5 % Place 1 patch onto the skin daily. Remove & Discard patch within 12 hours or as directed by MD  . [DISCONTINUED] lithium carbonate 150 MG capsule Take 1 capsule (150 mg total) by mouth at bedtime. (Patient not taking: Reported on 09/17/2016)  . [DISCONTINUED] oxyCODONE-acetaminophen (PERCOCET/ROXICET) 5-325 MG tablet Take 1 tablet by mouth every 4 (four) hours as needed for severe pain. (Patient not taking: Reported on 10/06/2017)  . [DISCONTINUED] traMADol (ULTRAM) 50 MG tablet Take 1 tablet (50 mg total) by mouth every 6 (six) hours as needed. (Patient not taking: Reported on 12/15/2017)   No facility-administered encounter medications on file as of 02/15/2018.     Allergies: Fentanyl; Percocet [oxycodone-acetaminophen]; and Robaxin [methocarbamol]  Body mass index is 37.44 kg/m.  Blood pressure 112/74, pulse 99, height 5\' 5"  (1.651 m), weight 225 lb (102.1 kg),  last menstrual period 02/14/2018, SpO2 97 %.    Review of Systems  Constitutional: Positive for fatigue. Negative for activity change, appetite change, chills, diaphoresis, fever and unexpected weight change.  HENT: Positive for congestion.   Eyes: Negative for visual disturbance.  Respiratory: Positive for cough. Negative for chest tightness, shortness of breath, wheezing and stridor.   Cardiovascular: Negative for chest pain, palpitations and leg swelling.  Endocrine: Negative for cold intolerance, heat intolerance, polydipsia, polyphagia and polyuria.  Musculoskeletal: Positive for back pain.  Skin: Negative for color change, rash and wound.  Neurological: Negative for dizziness and headaches.  Psychiatric/Behavioral: Positive for dysphoric mood and sleep disturbance. Negative for hallucinations, self-injury and suicidal ideas. The patient is nervous/anxious. The patient is not hyperactive.        Objective:   Physical Exam  Constitutional: She is oriented to person, place, and  time. She appears well-developed and well-nourished. No distress.  HENT:  Head: Normocephalic and atraumatic.  Right Ear: External ear normal.  Left Ear: External ear normal.  Eyes: Conjunctivae are normal. Pupils are equal, round, and reactive to light.  bil xanthelasma   Cardiovascular: Normal rate, regular rhythm, normal heart sounds and intact distal pulses.  No murmur heard. Pulmonary/Chest: Effort normal and breath sounds normal. No respiratory distress. She has no wheezes. She has no rales. She exhibits no tenderness.  Neurological: She is alert and oriented to person, place, and time.  Skin: Skin is warm. No rash noted. She is not diaphoretic. No erythema. No pallor.  Psychiatric: She has a normal mood and affect. Her behavior is normal. Judgment and thought content normal.  Nursing note and vitals reviewed.         Assessment & Plan:   1. Healthcare maintenance   2. Depression,  unspecified depression type   3. Bipolar 1 disorder (HCC)   4. Cough in adult   5. Acute bacterial conjunctivitis of right eye     Healthcare maintenance Continue all medications as directed. Continue excellent water intake and follow heart healthy diet. Increase regular exercise.  Recommend at least 30 minutes daily, 5 days per week of walking, jogging, biking, swimming, YouTube/Pinterest workout videos. Eye drops sent in. Discard eye make-up, wash pillow case. Reduce-stop tobacco use. Please schedule complete physical in 4 weeks, obtain fasting labs the week prior.  Depression Stable Takes paroxetine 40mg  QD Denies thoughts of harming herself/others Vehemently declined CBT referral Followed by psychiatry every 2-3 months  Bipolar 1 disorder (HCC) Stable Followed by psychiatry every 2-3 months Currently taking Quetiapine 100mg  x 2 abs QHS  She reports Gabapentine 400mg  Q8H for bipolar disorder as well She denies thoughts of harming herself/others  Acute bacterial conjunctivitis of right eye Compress to eye Discard eye make-up, wash pillow case Cipro eye gtt's  Cough in adult Increased non productive cough for last 4 days She continues to smoke pack/day  Tobacco use since age 34 If cough continues into next week, please call and will send in ABX    FOLLOW-UP:  Return in about 4 weeks (around 03/15/2018) for CPE, Fasting Labs.

## 2018-02-15 ENCOUNTER — Other Ambulatory Visit: Payer: Self-pay | Admitting: Adult Health

## 2018-02-15 ENCOUNTER — Encounter: Payer: Self-pay | Admitting: Adult Health

## 2018-02-15 ENCOUNTER — Ambulatory Visit (INDEPENDENT_AMBULATORY_CARE_PROVIDER_SITE_OTHER): Payer: Self-pay | Admitting: Adult Health

## 2018-02-15 ENCOUNTER — Telehealth: Payer: Self-pay | Admitting: Adult Health

## 2018-02-15 VITALS — BP 112/74 | HR 99 | Ht 65.0 in | Wt 225.0 lb

## 2018-02-15 DIAGNOSIS — R05 Cough: Secondary | ICD-10-CM

## 2018-02-15 DIAGNOSIS — F319 Bipolar disorder, unspecified: Secondary | ICD-10-CM

## 2018-02-15 DIAGNOSIS — F329 Major depressive disorder, single episode, unspecified: Secondary | ICD-10-CM

## 2018-02-15 DIAGNOSIS — H1031 Unspecified acute conjunctivitis, right eye: Secondary | ICD-10-CM

## 2018-02-15 DIAGNOSIS — F32A Depression, unspecified: Secondary | ICD-10-CM

## 2018-02-15 DIAGNOSIS — Z0000 Encounter for general adult medical examination without abnormal findings: Secondary | ICD-10-CM

## 2018-02-15 DIAGNOSIS — R059 Cough, unspecified: Secondary | ICD-10-CM

## 2018-02-15 MED ORDER — CIPROFLOXACIN HCL 0.3 % OP OINT: TOPICAL_OINTMENT | Freq: Three times a day (TID) | OPHTHALMIC | 0 refills | Status: DC

## 2018-02-15 NOTE — Assessment & Plan Note (Signed)
Stable Followed by psychiatry every 2-3 months Currently taking Quetiapine 100mg  x 2 abs QHS  She reports Gabapentine 400mg  Q8H for bipolar disorder as well She denies thoughts of harming herself/others

## 2018-02-15 NOTE — Assessment & Plan Note (Signed)
Increased non productive cough for last 4 days She continues to smoke pack/day  Tobacco use since age 47 If cough continues into next week, please call and will send in ABX

## 2018-02-15 NOTE — Telephone Encounter (Signed)
[facility] needs order clarification

## 2018-02-15 NOTE — Assessment & Plan Note (Signed)
Stable Takes paroxetine 40mg  QD Denies thoughts of harming herself/others Vehemently declined CBT referral Followed by psychiatry every 2-3 months

## 2018-02-15 NOTE — Assessment & Plan Note (Signed)
Continue all medications as directed. Continue excellent water intake and follow heart healthy diet. Increase regular exercise.  Recommend at least 30 minutes daily, 5 days per week of walking, jogging, biking, swimming, YouTube/Pinterest workout videos. Eye drops sent in. Discard eye make-up, wash pillow case. Reduce-stop tobacco use. Please schedule complete physical in 4 weeks, obtain fasting labs the week prior.

## 2018-02-15 NOTE — Telephone Encounter (Signed)
Judeth CornfieldStephanie at [facility] states that [employer] insurance will not pay for ciprofloxacin ophthalmic ointment and will cost the patient $200.  Stephanie request changing therapy to erythromycin as this is the only medication she has in stock that the patient may be able to afford.  Per William HamburgerKaty Danford, ok to change therapy.  [name]. Olivia Royse, CMA

## 2018-02-15 NOTE — Patient Instructions (Signed)
Heart-Healthy Eating Plan Many factors influence your heart health, including eating and exercise habits. Heart (coronary) risk increases with abnormal blood fat (lipid) levels. Heart-healthy meal planning includes limiting unhealthy fats, increasing healthy fats, and making other small dietary changes. This includes maintaining a healthy body weight to help keep lipid levels within a normal range. What is my plan? Your health care provider recommends that you:  Get no more than __25__% of the total calories in your daily diet from fat.  Limit your intake of saturated fat to less than __5____% of your total calories each day.  Limit the amount of cholesterol in your diet to less than _300__ mg per day.  What types of fat should I choose?  Choose healthy fats more often. Choose monounsaturated and polyunsaturated fats, such as olive oil and canola oil, flaxseeds, walnuts, almonds, and seeds.  Eat more omega-3 fats. Good choices include salmon, mackerel, sardines, tuna, flaxseed oil, and ground flaxseeds. Aim to eat fish at least two times each week.  Limit saturated fats. Saturated fats are primarily found in animal products, such as meats, butter, and cream. Plant sources of saturated fats include palm oil, palm kernel oil, and coconut oil.  Avoid foods with partially hydrogenated oils in them. These contain trans fats. Examples of foods that contain trans fats are stick margarine, some tub margarines, cookies, crackers, and other baked goods. What general guidelines do I need to follow?  Check food labels carefully to identify foods with trans fats or high amounts of saturated fat.  Fill one half of your plate with vegetables and green salads. Eat 4-5 servings of vegetables per day. A serving of vegetables equals 1 cup of raw leafy vegetables,  cup of raw or cooked cut-up vegetables, or  cup of vegetable juice.  Fill one fourth of your plate with whole grains. Look for the word "whole"  as the first word in the ingredient list.  Fill one fourth of your plate with lean protein foods.  Eat 4-5 servings of fruit per day. A serving of fruit equals one medium whole fruit,  cup of dried fruit,  cup of fresh, frozen, or canned fruit, or  cup of 100% fruit juice.  Eat more foods that contain soluble fiber. Examples of foods that contain this type of fiber are apples, broccoli, carrots, beans, peas, and barley. Aim to get 20-30 g of fiber per day.  Eat more home-cooked food and less restaurant, buffet, and fast food.  Limit or avoid alcohol.  Limit foods that are high in starch and sugar.  Avoid fried foods.  Cook foods by using methods other than frying. Baking, boiling, grilling, and broiling are all great options. Other fat-reducing suggestions include: ? Removing the skin from poultry. ? Removing all visible fats from meats. ? Skimming the fat off of stews, soups, and gravies before serving them. ? Steaming vegetables in water or broth.  Lose weight if you are overweight. Losing just 5-10% of your initial body weight can help your overall health and prevent diseases such as diabetes and heart disease.  Increase your consumption of nuts, legumes, and seeds to 4-5 servings per week. One serving of dried beans or legumes equals  cup after being cooked, one serving of nuts equals 1 ounces, and one serving of seeds equals  ounce or 1 tablespoon.  You may need to monitor your salt (sodium) intake, especially if you have high blood pressure. Talk with your health care provider or dietitian to get  more information about reducing sodium. What foods can I eat? Grains  Breads, including [place], white, pita, wheat, raisin, rye, oatmeal, and [place]. Tortillas that are neither fried nor made with lard or trans fat. Low-fat rolls, including hotdog and hamburger buns and English muffins. Biscuits. Muffins. Waffles. Pancakes. Light popcorn. Whole-grain cereals. Flatbread. Melba  toast. Pretzels. Breadsticks. Rusks. Low-fat snacks and crackers, including oyster, saltine, matzo, graham, animal, and rye. Rice and pasta, including brown rice and those that are made with whole wheat. Vegetables All vegetables. Fruits All fruits, but limit coconut. Meats and Other Protein Sources Lean, well-trimmed beef, veal, pork, and lamb. Chicken and [place] without skin. All fish and shellfish. Wild duck, rabbit, pheasant, and venison. Egg whites or low-cholesterol egg substitutes. Dried beans, peas, lentils, and tofu.Seeds and most nuts. Dairy Low-fat or nonfat cheeses, including ricotta, string, and mozzarella. Skim or 1% milk that is liquid, powdered, or evaporated. Buttermilk that is made with low-fat milk. Nonfat or low-fat yogurt. Beverages Mineral water. Diet carbonated beverages. Sweets and Desserts Sherbets and fruit ices. Honey, jam, marmalade, jelly, and syrups. Meringues and gelatins. Pure sugar candy, such as hard candy, jelly beans, gumdrops, mints, marshmallows, and small amounts of dark chocolate. [employer] Eat all sweets and desserts in moderation. Fats and Oils Nonhydrogenated (trans-free) margarines. Vegetable oils, including soybean, sesame, sunflower, olive, peanut, safflower, corn, canola, and cottonseed. Salad dressings or mayonnaise that are made with a vegetable oil. Limit added fats and oils that you use for cooking, baking, salads, and as spreads. Other Cocoa powder. Coffee and tea. All seasonings and condiments. The items listed above may not be a complete list of recommended foods or beverages. Contact your dietitian for more options. What foods are not recommended? Grains Breads that are made with saturated or trans fats, oils, or whole milk. Croissants. Butter rolls. Cheese breads. Sweet rolls. Donuts. Buttered popcorn. Chow mein noodles. High-fat crackers, such as cheese or butter crackers. Meats and Other Protein Sources Fatty meats, such as  hotdogs, short ribs, sausage, spareribs, bacon, ribeye roast or steak, and mutton. High-fat deli meats, such as salami and bologna. Caviar. Domestic duck and goose. Organ meats, such as kidney, liver, sweetbreads, brains, gizzard, chitterlings, and heart. Dairy Cream, sour cream, cream cheese, and creamed cottage cheese. Whole milk cheeses, including blue (bleu), Monterey Jack, [place], [place], American, [place], Swiss, [place], [place], and [place]. Whole or 2% milk that is liquid, evaporated, or condensed. Whole buttermilk. Cream sauce or high-fat cheese sauce. Yogurt that is made from whole milk. Beverages Regular sodas and drinks with added sugar. Sweets and Desserts Frosting. Pudding. Cookies. Cakes other than angel food cake. Candy that has milk chocolate or white chocolate, hydrogenated fat, butter, coconut, or unknown ingredients. Buttered syrups. Full-fat ice cream or ice cream drinks. Fats and Oils Gravy that has suet, meat fat, or shortening. Cocoa butter, hydrogenated oils, palm oil, coconut oil, palm kernel oil. These can often be found in baked products, candy, fried foods, nondairy creamers, and whipped toppings. Solid fats and shortenings, including bacon fat, salt pork, lard, and butter. Nondairy cream substitutes, such as coffee creamers and sour cream substitutes. Salad dressings that are made of unknown oils, cheese, or sour cream. The items listed above may not be a complete list of foods and beverages to avoid. Contact your dietitian for more information. This information is not intended to replace advice given to you by your health care provider. Make sure you discuss any questions you have with your health care  provider. Document Released: 09/23/2008 Document Revised: 07/04/2016 Document Reviewed: 06/08/2014 Elsevier Interactive Patient Education  2018 Elsevier Inc.   Bacterial Conjunctivitis Bacterial conjunctivitis is an infection of your conjunctiva. This is the clear  membrane that covers the white part of your eye and the inner surface of your eyelid. This condition can make your eye:  Red or pink.  Itchy.  This condition is caused by bacteria. This condition spreads very easily from person to person (is contagious) and from one eye to the other eye. Follow these instructions at home: Medicines  Take or apply your antibiotic medicine as told by your doctor. Do not stop taking or applying the antibiotic even if you start to feel better.  Take or apply over-the-counter and prescription medicines only as told by your doctor.  Do not touch your eyelid with the eye drop bottle or the ointment tube. Managing discomfort  Wipe any fluid from your eye with a warm, wet washcloth or a cotton ball.  Place a cool, clean washcloth on your eye. Do this for 10-20 minutes, 3-4 times per day. General instructions  Do not wear contact lenses until the irritation is gone. Wear glasses until your doctor says it is okay to wear contacts.  Do not wear eye makeup until your symptoms are gone. Throw away any old makeup.  Change or wash your pillowcase every day.  Do not share towels or washcloths with anyone.  Wash your hands often with soap and water. Use paper towels to dry your hands.  Do not touch or rub your eyes.  Do not drive or use heavy machinery if your vision is blurry. Contact a doctor if:  You have a fever.  Your symptoms do not get better after 10 days. Get help right away if:  You have a fever and your symptoms suddenly get worse.  You have very bad pain when you move your eye.  Your face: ? Hurts. ? Is red. ? Is swollen.  You have sudden loss of vision. This information is not intended to replace advice given to you by your health care provider. Make sure you discuss any questions you have with your health care provider. Document Released: 09/23/2008 Document Revised: 05/22/2016 Document Reviewed: 09/27/2015 Elsevier Interactive  Patient Education  2018 [employer]  Continue all medications as directed. Continue excellent water intake and follow heart healthy diet. Increase regular exercise.  Recommend at least 30 minutes daily, 5 days per week of walking, jogging, biking, swimming, YouTube/Pinterest workout videos. Eye drops sent in. Discard eye make-up, wash pillow case. Reduce-stop tobacco use. Please schedule complete physical in 4 weeks, obtain fasting labs the week prior. WELCOME TO THE PRACTICE!

## 2018-02-15 NOTE — Assessment & Plan Note (Signed)
Compress to eye Discard eye make-up, wash pillow case Cipro eye gtt's

## 2018-02-25 ENCOUNTER — Telehealth: Payer: Self-pay | Admitting: Adult Health

## 2018-02-25 NOTE — Telephone Encounter (Signed)
Pt informed that she would need OV.  Pt expressed understanding and is agreeable.  [name]. Nelson, CMA

## 2018-02-25 NOTE — Telephone Encounter (Signed)
Patient wants discuss a condition she is going through with someone clinical (she will sch an appt if needed but wants to discuss it first). She is having episodes where she intentionally chews on her tongue and lips, so severe that she thinks she will chew them off. She knows that's its intentional but can't stop doing it.

## 2018-02-28 NOTE — Progress Notes (Addendum)
Subjective:    Patient ID: Stacie Sanders, female    DOB: [birth year], 47 y.o.   MRN: [id number]  HPI: 02/15/18 OV:  [name] is here to establish as a new pt.  She is a pleasant 47 year old female.  PMH: depression, bipolar 1 disorder, anxiety disorder,impaired glucose intolerance, IBS, and hx of intentional overdose of klonopin, xanax, alcohol, and possibly anti-histamine 11/2015.  Her 45 year old grandson drowned in her home pool and it caused severe/acute depression.  She currently denies thoughts of harming herself/others and is followed by psychiatry every 2-3 months. She vehemently declines CBT referral, states "I don't want to have to tell my story over again". She smokes pack/day, since age 47 She rarely uses ETOH and has lost 13 lbs in last three weeks with reducing CHO/sugar intake She does not work outside of the house, she has applied/been denies for disability and is considering re-applying. She has two children living with her ages 6119 and 3326 She has two acute complaints 1) Increased non productive cough for last 4 days 2) OD redness, foreign body sensation, excessive tearing and woke up with crusted eyelids this am  03/01/18 OV: Ms. Stacie Sanders presents with "chewing on my tongue" that she reports has been occurring on/off since 2017. She reports that "the chewing has gotten much worse" in the last few weeks.  Her husband injured his shoulder approx two weeks ago and is unable to work, causing financial strain.  She is also unhappy with the the lack of help with household chores by her children She denies stimulant drug use and reports intermittent caffeine intake. She smoked pack/day She reports sparodic ETOH use, reports having 3 beers Friday night She reports adequate sleep, only needing to rise once to urinate. She has hx of gingivitis and has not seen dentist in years. She denies HA/dizziness/CP/dyspnea/palpitations. She vehemently denies thoughts of harming herself/others   She has sig mental health hx- Biploar I and [facility], lat OV 01/2018- she cannot recall if any modifications were made to her medications then. She denies auditory/visual hallucinations  Depression screen PHQ 2/9 03/01/2018  Decreased Interest 1  Down, Depressed, Hopeless 1  PHQ - 2 Score 2  Altered sleeping 1  Tired, decreased energy 2  Change in appetite 2  Feeling bad or failure about yourself  2  Trouble concentrating 2  Moving slowly or fidgety/restless 2  Suicidal thoughts 0  PHQ-9 Score 13  Difficult doing work/chores Somewhat difficult   GAD 7 : Generalized Anxiety Score 03/01/2018  Nervous, Anxious, on Edge 3  Control/stop worrying 3  Worry too much - different things 3  Trouble relaxing 3  Restless 3  Easily annoyed or irritable 3  Afraid - awful might happen 3  Total GAD 7 Score 21  Anxiety Difficulty Extremely difficult    Patient Care Team    Relationship Specialty Notifications Start End  Julaine Fusianford, Areliz Rothman D, NP PCP - General Family Medicine  02/15/18   [facility], [facility]    02/15/18   Trey SailorsPa, [facility]  Family Medicine  02/15/18   [facility]    02/15/18     Patient Active Problem List   Diagnosis Date Noted  . Fatigue 03/01/2018  . Irritability 03/01/2018  . GAD (generalized anxiety disorder) 03/01/2018  . Gingivitis 03/01/2018  . Grinding of teeth 03/01/2018  . Healthcare maintenance 02/15/2018  . Depression 02/15/2018  . Bipolar 1 disorder (HCC) 02/15/2018  . Cough  in adult 02/15/2018  . Acute bacterial conjunctivitis of right eye 02/15/2018  . Acute respiratory failure (HCC) 01/03/2016  . Acute respiratory failure with hypoxemia (HCC)   . Acute encephalopathy   . Drug overdose 12/29/2015  . Overdose 12/29/2015     Past Medical History:  Diagnosis Date  . Allergic rhinitis   . Bipolar 1 disorder (HCC)   . Borderline diabetes mellitus   . Colon polyp   . Depression   . Dyspnea on  exertion   . Esophageal stricture    scheduled for EGD and dilation 03-18-2016  . GAD (generalized anxiety disorder)   . History of acute respiratory failure    12-29-2015  due to polysubstance overdose--  pt intubated --  resolved   . History of febrile seizure    x1  01/ 2017--  none since  . History of kidney stones    staghorn  . History of suicide attempt    12-29-2015  -- overdose klonopin, xanax, alcohol--  acute respirtory failure and acute encenphalopathy -- both resolved  . IBS (irritable bowel syndrome)   . Irregular menstrual cycle   . Renal calculi    bilateral     Past Surgical History:  Procedure Laterality Date  . COLONOSCOPY    . CYSTOSCOPY WITH STENT PLACEMENT Bilateral 03/10/2016   Procedure: CYSTOSCOPY WITH STENT PLACEMENT;  Surgeon: Malen Gauze, MD;  [facility];  Service: Urology;  Laterality: Bilateral;  . CYSTOSCOPY/RETROGRADE/URETEROSCOPY/STONE EXTRACTION WITH BASKET Bilateral 03/10/2016   Procedure: CYSTOSCOPY/RETROGRADE/URETEROSCOPY/STONE EXTRACTION WITH BASKET;  Surgeon: Malen Gauze, MD;  [facility];  Service: Urology;  Laterality: Bilateral;  . EXTRACORPOREAL SHOCK WAVE LITHOTRIPSY  x2    . PERCUTANEOUS NEPHROSTOLITHOTOMY Right 09-18-2005   staghorn  . TUBAL LIGATION  1999     Family History  Problem Relation Age of Onset  . Cancer Father        pancreas  . Melanoma Father   . Cancer Maternal Grandmother        lung  . COPD Paternal Grandmother   . Melanoma Paternal Grandmother      Social History   Substance and Sexual Activity  Drug Use No     Social History   Substance and Sexual Activity  Alcohol Use Yes   Comment: occ     Social History   Tobacco Use  Smoking Status Current Every Day Smoker  . Packs/day: 0.50  . Years: 20.00  . Pack years: 10.00  . Types: Cigarettes  Smokeless Tobacco Never Used     Outpatient Encounter Medications as of 03/01/2018   Medication Sig  . ciprofloxacin (CILOXAN) 0.3 % ophthalmic ointment Place into the right eye 3 (three) times daily.  Marland Kitchen gabapentin (NEURONTIN) 400 MG capsule Take 400 mg by mouth 3 (three) times daily.   . hydrOXYzine (VISTARIL) 25 MG capsule Take 25 mg by mouth 3 (three) times daily as needed.  . naproxen (NAPROSYN) 250 MG tablet Take 750 mg by mouth as needed.  Marland Kitchen omeprazole (PRILOSEC OTC) 20 MG tablet Take 2 tablets (40 mg total) by mouth daily.  Marland Kitchen PARoxetine (PAXIL) 40 MG tablet Take 40 mg by mouth daily.   . QUEtiapine (SEROQUEL) 100 MG tablet Take 200 mg by mouth at bedtime.   . topiramate (TOPAMAX) 50 MG tablet Take 50 mg by mouth 2 (two) times daily.   . chlorhexidine (PERIDEX) 0.12 % solution Use as directed 15 mLs in the mouth or throat 2 (two)  times daily.  . [DISCONTINUED] ondansetron (ZOFRAN) 4 MG tablet Take 1 tablet (4 mg total) by mouth every 6 (six) hours.   No facility-administered encounter medications on file as of 03/01/2018.     Allergies: Fentanyl; Percocet [oxycodone-acetaminophen]; and Robaxin [methocarbamol]  Body mass index is 36.84 kg/m.  Blood pressure 115/67, pulse 79, height 5\' 5"  (1.651 m), weight 221 lb 6.4 oz (100.4 kg), last menstrual period 02/14/2018, SpO2 95 %.    Review of Systems  Constitutional: Positive for fatigue. Negative for activity change, appetite change, chills, diaphoresis, fever and unexpected weight change.  HENT: Positive for congestion.   Eyes: Negative for visual disturbance.  Respiratory: Positive for cough. Negative for chest tightness, shortness of breath, wheezing and stridor.   Cardiovascular: Negative for chest pain, palpitations and leg swelling.  Endocrine: Negative for cold intolerance, heat intolerance, polydipsia, polyphagia and polyuria.  Musculoskeletal: Positive for back pain.  Skin: Negative for color change, rash and wound.  Neurological: Negative for dizziness and headaches.  Psychiatric/Behavioral: Positive for  dysphoric mood and sleep disturbance. Negative for hallucinations, self-injury and suicidal ideas. The patient is nervous/anxious. The patient is not hyperactive.        Objective:   Physical Exam  Constitutional: She is oriented to person, place, and time. She appears well-developed and well-nourished. No distress.  HENT:  Head: Normocephalic and atraumatic.  Right Ear: External ear normal.  Left Ear: External ear normal.  Mouth/Throat: Uvula is midline. Mucous membranes are dry. No trismus in the jaw. Abnormal dentition. Uvula swelling and dental caries present. No dental abscesses. Posterior oropharyngeal edema and posterior oropharyngeal erythema present. No oropharyngeal exudate or tonsillar abscesses.  Eyes: Conjunctivae are normal. Pupils are equal, round, and reactive to light.  bil xanthelasma   Cardiovascular: Normal rate, regular rhythm, normal heart sounds and intact distal pulses.  No murmur heard. Pulmonary/Chest: Effort normal and breath sounds normal. No respiratory distress. She has no wheezes. She has no rales. She exhibits no tenderness.  Neurological: She is alert and oriented to person, place, and time.  Skin: Skin is warm. No rash noted. She is not diaphoretic. No erythema. No pallor.  Psychiatric: Her speech is normal and behavior is normal. Judgment and thought content normal. Her mood appears anxious. Cognition and memory are normal.  Nursing note and vitals reviewed.          Assessment & Plan:   1. Irritability   2. Fatigue, unspecified type   3. Healthcare maintenance   4. Gingivitis   5. GAD (generalized anxiety disorder)   6. Grinding of teeth     Fatigue TSH Vit d levels drawn today  Irritability Please continue all medications as directed. Please use mouthwash as directed. Please call your psychiatrist immediately to address your symptoms and adjustment of medications if necessary. If you experience any thoughts of harming yourself/others  seek immediate medical assistance. We will call you when lab results are available.  GAD (generalized anxiety disorder) Please continue all medications as directed. Please use mouthwash as directed. Please call your psychiatrist immediately to address your symptoms and adjustment of medications if necessary. If you experience any thoughts of harming yourself/others seek immediate medical assistance. We will call you when lab results are available.  Gingivitis Peridex mouth wash provided F/u with dentist ASAP  Grinding of teeth F/u with psychiatrist ASAP Recommend OTC mouthguard  She denies stimulant drug use    FOLLOW-UP:  Return if symptoms worsen or fail to improve.

## 2018-03-01 ENCOUNTER — Encounter: Payer: Self-pay | Admitting: Adult Health

## 2018-03-01 ENCOUNTER — Ambulatory Visit (INDEPENDENT_AMBULATORY_CARE_PROVIDER_SITE_OTHER): Payer: Self-pay | Admitting: Adult Health

## 2018-03-01 VITALS — BP 115/67 | HR 79 | Ht 65.0 in | Wt 221.4 lb

## 2018-03-01 DIAGNOSIS — F411 Generalized anxiety disorder: Secondary | ICD-10-CM | POA: Insufficient documentation

## 2018-03-01 DIAGNOSIS — R5383 Other fatigue: Secondary | ICD-10-CM | POA: Insufficient documentation

## 2018-03-01 DIAGNOSIS — K0510 Chronic gingivitis, plaque induced: Secondary | ICD-10-CM | POA: Insufficient documentation

## 2018-03-01 DIAGNOSIS — F458 Other somatoform disorders: Secondary | ICD-10-CM

## 2018-03-01 DIAGNOSIS — Z0000 Encounter for general adult medical examination without abnormal findings: Secondary | ICD-10-CM

## 2018-03-01 DIAGNOSIS — R454 Irritability and anger: Secondary | ICD-10-CM

## 2018-03-01 MED ORDER — CHLORHEXIDINE GLUCONATE 0.12 % MT SOLN: 15.0000 mL | Freq: Two times a day (BID) | OROMUCOSAL | 0 refills | Status: DC

## 2018-03-01 NOTE — Assessment & Plan Note (Signed)
TSH Vit d levels drawn today

## 2018-03-01 NOTE — Assessment & Plan Note (Signed)
F/u with psychiatrist ASAP Recommend OTC mouthguard  She denies stimulant drug use

## 2018-03-01 NOTE — Patient Instructions (Addendum)
Generalized Anxiety Disorder, Adult Generalized anxiety disorder (GAD) is a mental health disorder. People with this condition constantly worry about everyday events. Unlike normal anxiety, worry related to GAD is not triggered by a specific event. These worries also do not fade or get better with time. GAD interferes with life functions, including relationships, work, and school. GAD can vary from mild to severe. People with severe GAD can have intense waves of anxiety with physical symptoms (panic attacks). What are the causes? The exact cause of GAD is not known. What increases the risk? This condition is more likely to develop in:  Women.  People who have a family history of anxiety disorders.  People who are very shy.  People who experience very stressful life events, such as the death of a loved one.  People who have a very stressful family environment.  What are the signs or symptoms? People with GAD often worry excessively about many things in their lives, such as their health and family. They may also be overly concerned about:  Doing well at work.  Being on time.  Natural disasters.  Friendships.  Physical symptoms of GAD include:  Fatigue.  Muscle tension or having muscle twitches.  Trembling or feeling shaky.  Being easily startled.  Feeling like your heart is pounding or racing.  Feeling out of breath or like you cannot take a deep breath.  Having trouble falling asleep or staying asleep.  Sweating.  Nausea, diarrhea, or irritable bowel syndrome (IBS).  Headaches.  Trouble concentrating or remembering facts.  Restlessness.  Irritability.  How is this diagnosed? Your health care provider can diagnose GAD based on your symptoms and medical history. You will also have a physical exam. The health care provider will ask specific questions about your symptoms, including how severe they are, when they started, and if they come and go. Your health care  provider may ask you about your use of alcohol or drugs, including prescription medicines. Your health care provider may refer you to a mental health specialist for further evaluation. Your health care provider will do a thorough examination and may perform additional tests to rule out other possible causes of your symptoms. To be diagnosed with GAD, a person must have anxiety that:  Is out of his or her control.  Affects several different aspects of his or her life, such as work and relationships.  Causes distress that makes him or her unable to take part in normal activities.  Includes at least three physical symptoms of GAD, such as restlessness, fatigue, trouble concentrating, irritability, muscle tension, or sleep problems.  Before your health care provider can confirm a diagnosis of GAD, these symptoms must be present more days than they are not, and they must last for six months or longer. How is this treated? The following therapies are usually used to treat GAD:  Medicine. Antidepressant medicine is usually prescribed for long-term daily control. Antianxiety medicines may be added in severe cases, especially when panic attacks occur.  Talk therapy (psychotherapy). Certain types of talk therapy can be helpful in treating GAD by providing support, education, and guidance. Options include: ? Cognitive behavioral therapy (CBT). People learn coping skills and techniques to ease their anxiety. They learn to identify unrealistic or negative thoughts and behaviors and to replace them with positive ones. ? Acceptance and commitment therapy (ACT). This treatment teaches people how to be mindful as a way to cope with unwanted thoughts and feelings. ? Biofeedback. This process trains you to   manage your body's response (physiological response) through breathing techniques and relaxation methods. You will work with a therapist while machines are used to monitor your physical symptoms.  Stress  management techniques. These include yoga, meditation, and exercise.  A mental health specialist can help determine which treatment is best for you. Some people see improvement with one type of therapy. However, other people require a combination of therapies. Follow these instructions at home:  Take over-the-counter and prescription medicines only as told by your health care provider.  Try to maintain a normal routine.  Try to anticipate stressful situations and allow extra time to manage them.  Practice any stress management or self-calming techniques as taught by your health care provider.  Do not punish yourself for setbacks or for not making progress.  Try to recognize your accomplishments, even if they are small.  Keep all follow-up visits as told by your health care provider. This is important. Contact a health care provider if:  Your symptoms do not get better.  Your symptoms get worse.  You have signs of depression, such as: ? A persistently sad, cranky, or irritable mood. ? Loss of enjoyment in activities that used to bring you joy. ? Change in weight or eating. ? Changes in sleeping habits. ? Avoiding friends or family members. ? Loss of energy for normal tasks. ? Feelings of guilt or worthlessness. Get help right away if:  You have serious thoughts about hurting yourself or others. If you ever feel like you may hurt yourself or others, or have thoughts about taking your own life, get help right away. You can go to your nearest emergency department or call:  Your local emergency services (911 in the [place]  A suicide crisis helpline, such as the National Suicide Prevention Lifeline at 540 285 2830. This is open 24 hours a day.  Summary  Generalized anxiety disorder (GAD) is a mental health disorder that involves worry that is not triggered by a specific event.  People with GAD often worry excessively about many things in their lives, such as their health and  family.  GAD may cause physical symptoms such as restlessness, trouble concentrating, sleep problems, frequent sweating, nausea, diarrhea, headaches, and trembling or muscle twitching.  A mental health specialist can help determine which treatment is best for you. Some people see improvement with one type of therapy. However, other people require a combination of therapies. This information is not intended to replace advice given to you by your health care provider. Make sure you discuss any questions you have with your health care provider. Document Released: 04/11/2013 Document Revised: 11/04/2016 Document Reviewed: 11/04/2016 Elsevier Interactive Patient Education  2018 [employer]  Temporomandibular Joint Syndrome Temporomandibular joint (TMJ) syndrome is a condition that affects the joints between your jaw and your skull. The TMJs are located near your ears and allow your jaw to open and close. These joints and the nearby muscles are involved in all movements of the jaw. People with TMJ syndrome have pain in the area of these joints and muscles. Chewing, biting, or other movements of the jaw can be difficult or painful. TMJ syndrome can be caused by various things. In many cases, the condition is mild and goes away within a few weeks. For some people, the condition can become a long-term problem. What are the causes? Possible causes of TMJ syndrome include:  Grinding your teeth or clenching your jaw. Some people do this when they are under stress.  Arthritis.  Injury to the  jaw.  Head or neck injury.  Teeth or dentures that are not aligned well.  In some cases, the cause of TMJ syndrome may not be known. What are the signs or symptoms? The most common symptom is an aching pain on the side of the head in the area of the TMJ. Other symptoms may include:  Pain when moving your jaw, such as when chewing or biting.  Being unable to open your jaw all the way.  Making a clicking  sound when you open your mouth.  Headache.  Earache.  Neck or shoulder pain.  How is this diagnosed? Diagnosis can usually be made based on your symptoms, your medical history, and a physical exam. Your health care provider may check the range of motion of your jaw. Imaging tests, such as X-rays or an MRI, are sometimes done. You may need to see your dentist to determine if your teeth and jaw are lined up correctly. How is this treated? TMJ syndrome often goes away on its own. If treatment is needed, the options may include:  Eating soft foods and applying ice or heat.  Medicines to relieve pain or inflammation.  Medicines to relax the muscles.  A splint, bite plate, or mouthpiece to prevent teeth grinding or jaw clenching.  Relaxation techniques or counseling to help reduce stress.  Transcutaneous electrical nerve stimulation (TENS). This helps to relieve pain by applying an electrical current through the skin.  Acupuncture. This is sometimes helpful to relieve pain.  Jaw surgery. This is rarely needed.  Follow these instructions at home:  Take medicines only as directed by your health care provider.  Eat a soft diet if you are having trouble chewing.  Apply ice to the painful area. ? Put ice in a plastic bag. ? Place a towel between your skin and the bag. ? Leave the ice on for 20 minutes, 2-3 times a day.  Apply a warm compress to the painful area as directed.  Massage your jaw area and perform any jaw stretching exercises as recommended by your health care provider.  If you were given a mouthpiece or bite plate, wear it as directed.  Avoid foods that require a lot of chewing. Do not chew gum.  Keep all follow-up visits as directed by your health care provider. This is important. Contact a health care provider if:  You are having trouble eating.  You have new or worsening symptoms. Get help right away if:  Your jaw locks open or closed. This information is  not intended to replace advice given to you by your health care provider. Make sure you discuss any questions you have with your health care provider. Document Released: 09/09/2001 Document Revised: 08/14/2016 Document Reviewed: 07/20/2014 Elsevier Interactive Patient Education  2018 [employer].   Chlorhexidine oral rinse What is this medicine? CHLORHEXIDINE (klor HEX i deen) oral rinse is used to treat gingivitis. This medicine may be used for other purposes; ask your health care provider or pharmacist if you have questions. COMMON BRAND NAME(S): Oro Clense, Peridex, Periogard, PerioRx, Perisol What should I tell my health care provider before I take this medicine? They need to know if you have any of the following conditions: -front tooth fillings, dentures or other mouth appliances, especially those having rough surfaces -other gum or dental problems -an unusual or allergic reaction to chlorhexidine, other medicines, foods, dyes, or preservatives -pregnant or trying to get pregnant -breast-feeding How should I use this medicine? This medicine should be used immediately  after brushing and flossing. Rinse all toothpaste completely from your mouth. Follow the directions on the prescription label. The cap on the original container may be used to measure the dose. If you do not have the cap, ask your pharmacist for a device to measure the dose. Swish the medicine in your mouth for 30 seconds. Do not swallow it. Use the medicine full strength. Do not mix with water before using. Do not eat or drink for several hours after using the rinse. This may decrease the effect of the medicine. Talk to your pediatrician regarding the use of this medicine in children. Special care may be needed. Overdosage: If you think you have taken too much of this medicine contact a poison control center or emergency room at once. NOTE: This medicine is only for you. Do not share this medicine with others. What if I  miss a dose? If you miss a dose, use it as soon as possible. If it is almost time for your next dose, use only that dose. Do not use double or extra doses. What may interact with this medicine? Interactions are not expected. This list may not describe all possible interactions. Give your health care provider a list of all the medicines, herbs, non-prescription drugs, or dietary supplements you use. Also tell them if you smoke, drink alcohol, or use illegal drugs. Some items may interact with your medicine. What should I watch for while using this medicine? Visit your dentist every 6 months for dental cleanings and to check on your progress. This medicine may have a bitter aftertaste. Do not rinse your mouth after using this medicine because that will increase the bitter taste. Rinsing will also decrease the effect of the medicine. This medicine may change the way food tastes to you. This effect may last up to 4 hours after using the rinse. Usually this effect becomes less noticeable as you continue using the rinse. Your taste should return to normal after stopping the use of this medicine. This medicine may increase tartar build-up and stain your teeth, dentures or fillings. Brush with a tartar control toothpaste and floss daily to help decrease the amount of tartar build-up and staining. If a child accidentally drinks this medicine, get medical attention right away. Small children may have nausea and vomiting and signs of drunkenness. What side effects may I notice from receiving this medicine? Side effects that you should report to your doctor or health care professional as soon as possible: -allergic reactions like skin rash, itching or hives, swelling of the face, lips, or tongue -breathing problems Side effects that usually do not require medical attention (report to your doctor or health care professional if they continue or are bothersome): -changes in taste or a metallic taste -more tartar  build-up -mouth or tongue irritation -staining of teeth, fillings, dentures or other mouth appliances -swollen glands on side of face or neck This list may not describe all possible side effects. Call your doctor for medical advice about side effects. You may report side effects to FDA at 1-800-FDA-1088. Where should I keep my medicine? Keep the medicine out of reach from children. Store above 0 degrees C (32 degrees F). Do not freeze. Throw away any unused medicine after the expiration date. NOTE: This sheet is a summary. It may not cover all possible information. If you have questions about this medicine, talk to your doctor, pharmacist, or health care provider.  2018 Elsevier/Gold Standard (2016-01-16 20:47:10)   Please continue all medications as  directed. Please use mouthwash as directed. Please call your psychiatrist immediately to address your symptoms and adjustment of medications if necessary. If you experience any thoughts of harming yourself/others seek immediate medical assistance. We will call you when lab results are available. Please call clinic with any questions/concerns.

## 2018-03-01 NOTE — Assessment & Plan Note (Signed)
Peridex mouth wash provided F/u with dentist ASAP

## 2018-03-01 NOTE — Assessment & Plan Note (Signed)
Please continue all medications as directed. Please use mouthwash as directed. Please call your psychiatrist immediately to address your symptoms and adjustment of medications if necessary. If you experience any thoughts of harming yourself/others seek immediate medical assistance. We will call you when lab results are available.

## 2018-03-01 NOTE — Assessment & Plan Note (Signed)
Please continue all medications as directed. Please use mouthwash as directed. Please call your psychiatrist immediately to address your symptoms and adjustment of medications if necessary. If you experience any thoughts of harming yourself/others seek immediate medical assistance. We will call you when lab results are available. 

## 2018-03-02 ENCOUNTER — Other Ambulatory Visit: Payer: Self-pay | Admitting: Adult Health

## 2018-03-02 DIAGNOSIS — E559 Vitamin D deficiency, unspecified: Secondary | ICD-10-CM

## 2018-03-02 LAB — CBC WITH DIFFERENTIAL/PLATELET
BASOS ABS: 0 10*3/uL (ref 0.0–0.2)
Basos: 0 %
EOS (ABSOLUTE): 0.1 10*3/uL (ref 0.0–0.4)
Eos: 1 %
Hematocrit: 41.3 % (ref 34.0–46.6)
Hemoglobin: 14.3 g/dL (ref 11.1–15.9)
IMMATURE GRANULOCYTES: 0 %
Immature Grans (Abs): 0 10*3/uL (ref 0.0–0.1)
LYMPHS: 31 %
Lymphocytes Absolute: 2.8 10*3/uL (ref 0.7–3.1)
MCH: 31.2 pg (ref 26.6–33.0)
MCHC: 34.6 g/dL (ref 31.5–35.7)
MCV: 90 fL (ref 79–97)
MONOS ABS: 0.6 10*3/uL (ref 0.1–0.9)
Monocytes: 7 %
NEUTROS PCT: 61 %
Neutrophils Absolute: 5.5 10*3/uL (ref 1.4–7.0)
Platelets: 431 10*3/uL — ABNORMAL HIGH (ref 150–379)
RBC: 4.59 x10E6/uL (ref 3.77–5.28)
RDW: 14 % (ref 12.3–15.4)
WBC: 9.1 10*3/uL (ref 3.4–10.8)

## 2018-03-02 LAB — COMPREHENSIVE METABOLIC PANEL
A/G RATIO: 2 (ref 1.2–2.2)
ALT: 21 IU/L (ref 0–32)
AST: 16 IU/L (ref 0–40)
Albumin: 4.9 g/dL (ref 3.5–5.5)
Alkaline Phosphatase: 107 IU/L (ref 39–117)
BUN/Creatinine Ratio: 20 (ref 9–23)
BUN: 17 mg/dL (ref 6–24)
Bilirubin Total: 0.4 mg/dL (ref 0.0–1.2)
CALCIUM: 9.9 mg/dL (ref 8.7–10.2)
CO2: 21 mmol/L (ref 20–29)
CREATININE: 0.83 mg/dL (ref 0.57–1.00)
Chloride: 105 mmol/L (ref 96–106)
GFR calc Af Amer: 98 mL/min/{1.73_m2} (ref >59)
GFR, EST NON AFRICAN AMERICAN: 85 mL/min/{1.73_m2} (ref >59)
GLUCOSE: 104 mg/dL — AB (ref 65–99)
Globulin, Total: 2.4 g/dL (ref 1.5–4.5)
POTASSIUM: 4.7 mmol/L (ref 3.5–5.2)
Sodium: 140 mmol/L (ref 134–144)
Total Protein: 7.3 g/dL (ref 6.0–8.5)

## 2018-03-02 LAB — LIPID PANEL
CHOLESTEROL TOTAL: 204 mg/dL — AB (ref 100–199)
Chol/HDL Ratio: 3.3 ratio (ref 0.0–4.4)
HDL: 62 mg/dL (ref >39)
LDL Calculated: 90 mg/dL (ref 0–99)
Triglycerides: 258 mg/dL — ABNORMAL HIGH (ref 0–149)
VLDL CHOLESTEROL CAL: 52 mg/dL — AB (ref 5–40)

## 2018-03-02 LAB — HEMOGLOBIN A1C
ESTIMATED AVERAGE GLUCOSE: 111 mg/dL
Hgb A1c MFr Bld: 5.5 % (ref 4.8–5.6)

## 2018-03-02 LAB — VITAMIN D 25 HYDROXY (VIT D DEFICIENCY, FRACTURES): VIT D 25 HYDROXY: 23.7 ng/mL — AB (ref 30.0–100.0)

## 2018-03-02 LAB — TSH: TSH: 1.49 u[IU]/mL (ref 0.450–4.500)

## 2018-03-02 MED ORDER — VITAMIN D (ERGOCALCIFEROL) 1.25 MG (50000 UNIT) PO CAPS: 50000.0000 [IU] | ORAL_CAPSULE | ORAL | 0 refills | Status: DC

## 2018-03-02 NOTE — Progress Notes (Unsigned)
e

## 2018-03-04 ENCOUNTER — Telehealth: Payer: Self-pay | Admitting: Adult Health

## 2018-03-04 NOTE — Telephone Encounter (Signed)
Pt states because she has no [employer] &  no way to pay for meds to assist in smoking cession ----  Patient wants to know if there is a trial program or assistance provider can recommend or get her in to help her quit smoking.  --Pls call pt if questions.   --glh

## 2018-03-05 NOTE — Telephone Encounter (Signed)
Provided pt with contact information for [facility].  Pt expressed understanding.  [name]. Nelson, CMA

## 2018-03-16 ENCOUNTER — Other Ambulatory Visit: Payer: Medicaid Other

## 2018-04-23 ENCOUNTER — Telehealth: Payer: Self-pay | Admitting: Adult Health

## 2018-04-23 NOTE — Telephone Encounter (Signed)
Patient states she has completed 2 month course of Vit-D but continues to feel extreme fatigue.  --Patient wishes provider to advise what next?  --forwarding message to medical assistant  to review w/provider and f/u with patient@ [phone]  ---glh

## 2018-04-26 NOTE — Telephone Encounter (Signed)
Please advise.  T. Emelie Newsom, CMA 

## 2018-04-26 NOTE — Telephone Encounter (Signed)
Good Morning Stacie Sanders, Her TSH was WNL- 1.490 and Vit low at 23.7, these were drawn 03/01/18. She has only taken 1/2 of the full course of vit d Rx. Recommend that she complete this and also increase regular exercise and eat a heart healthy diet. If fatigue has not improved after vit d completed, recommend that she make an OV to further discuss/work-up. Thanks! Orpha Bur

## 2018-04-26 NOTE — Telephone Encounter (Signed)
Pt informed.  Pt expressed understanding and is agreeable.  Pt states that she only received #8 capsules.  Confirmed in the pt's chart that RX was sent for #16.  Advised pt to call pharmacy to f/u re: the other #8 capsules that she did not receive.  Pt expressed understanding.  [name], CMA

## 2018-05-26 ENCOUNTER — Encounter: Payer: Self-pay | Admitting: Family Medicine

## 2018-05-26 ENCOUNTER — Ambulatory Visit (INDEPENDENT_AMBULATORY_CARE_PROVIDER_SITE_OTHER): Payer: Self-pay | Admitting: Family Medicine

## 2018-05-26 VITALS — BP 113/67 | HR 88 | Temp 98.3°F | Ht 65.0 in | Wt 222.0 lb

## 2018-05-26 DIAGNOSIS — H1031 Unspecified acute conjunctivitis, right eye: Secondary | ICD-10-CM

## 2018-05-26 DIAGNOSIS — S0501XA Injury of conjunctiva and corneal abrasion without foreign body, right eye, initial encounter: Secondary | ICD-10-CM

## 2018-05-26 DIAGNOSIS — H5711 Ocular pain, right eye: Secondary | ICD-10-CM

## 2018-05-26 DIAGNOSIS — T1591XA Foreign body on external eye, part unspecified, right eye, initial encounter: Secondary | ICD-10-CM

## 2018-05-26 MED ORDER — IBUPROFEN 600 MG PO TABS: 600.0000 mg | ORAL_TABLET | Freq: Three times a day (TID) | ORAL | 0 refills | Status: DC | PRN

## 2018-05-26 MED ORDER — OFLOXACIN 0.3 % OP SOLN: OPHTHALMIC | 0 refills | Status: DC
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
## 2018-05-26 NOTE — Patient Instructions (Signed)
Corneal Abrasion  A corneal abrasion is a scratch or injury to the clear covering over the front of your eye (cornea). Your cornea forms a clear dome that protects your eye and helps to focus your vision. Your cornea is made up of many layers. The surface layer is a single layer of cells (corneal epithelium). It is one of the most sensitive tissues in your body. A corneal abrasion can be very painful.  If a corneal abrasion is not treated, it can become infected and cause an ulcer. This can lead to scarring. A scarred cornea can affect your vision. Sometimes abrasions come back in the same area, even after the original injury has healed (recurrent erosion syndrome).  What are the causes?  This condition may be caused by:   A poke in the eye.   A gritty or irritating substance (foreign body) in the eye.   Excessive eye rubbing.   Very dry eyes.   Certain eye infections.   Contact lenses that fit poorly or are worn for a long period of time. You can also injure your cornea when putting contacts lenses in your eye or taking them out.   Eye surgery.    Sometimes, the cause is unknown.  What are the signs or symptoms?  Symptoms of this condition include:   Eye pain. The pain may get worse when your eye is open or when you move your eye.   A feeling of something stuck in your eye.   Having trouble keeping your eye open, or not being able to keep it open.   Tearing and redness.   Sensitivity to light.   Blurred vision.   Headache.    How is this diagnosed?  This condition may be diagnosed based on:   Your medical history.   Your symptoms.   An eye exam. You may work with a health care provider who specializes in diseases and conditions of the eye (ophthalmologist). Before the eye exam, numbing drops may be put into your eye. You may also have dye put in your eye with a dropper or a small paper strip. The dye makes the abrasion easy to see when your ophthalmologist examines your eye with a light. Your  ophthalmologist may look at your eye through an eye scope (slit lamp).    How is this treated?  Treatment may vary depending on the cause of your condition, and it may include:   Washing out your eye.   Removing any foreign body.   Antibiotic drops or ointment to treat an infection.   Steroid drops or ointment to treat redness, irritation, or inflammation.   Pain medicine.   An eye patch to keep your eye closed.    Follow these instructions at home:  Medicines   Use eye drops or ointments as told by your eye care provider.   If you were prescribed antibiotic drops or ointment, use them as told by your eye care provider. Do not stop using the antibiotic even if you start to feel better.   Take over-the-counter and prescription medicines only as told by your eye care provider.   Do not drive or use heavy machinery while taking prescription pain medicine.  General instructions   If you have an eye patch, wear it as told by your eye care provider.  ? Do not drive or use machinery while wearing an eye patch. Your ability to judge distances will be impaired.  ? Follow instructions from your eye care provider   about when to remove the patch.   Ask your eye care provider whether you can use a cold, wet cloth (compress) on your eye to relieve pain.   Do not rub or touch your eye. Do not wash out your eye.   Do not wear contact lenses until your eye care provider says that this is okay.   Avoid bright light and eye strain.   Keep all follow-up visits as told by your eye care provider. This is important for preventing infection and vision loss.  Contact a health care provider if:   You continue to have eye pain and other symptoms for more than 2 days.   You develop new symptoms, such as redness, tearing, or discharge.   You have discharge that makes your eyelids stick together in the morning.   Your eye patch becomes so loose that you can blink your eye.   Symptoms return after the original abrasion has  healed.  Get help right away if:   You have severe eye pain that does not get better with medicine.   You have vision loss.  Summary   A corneal abrasion is a scratch on the outer layer of the clear covering over the front of your eye (cornea).   Corneal abrasion can cause eye pain, redness, tearing, and blurred vision.   This condition is usually treated with medicine to prevent infection and scarring. You also may have to wear an eye patch to cover your eye.   Let your eye care provider know if your symptoms continue for more than 2 days.  This information is not intended to replace advice given to you by your health care provider. Make sure you discuss any questions you have with your health care provider.  Document Released: 12/12/2000 Document Revised: 11/25/2016 Document Reviewed: 11/25/2016  [employer] Interactive Patient Education  2018 [employer]

## 2018-06-14 ENCOUNTER — Emergency Department (HOSPITAL_COMMUNITY): Payer: Self-pay

## 2018-06-14 ENCOUNTER — Encounter (HOSPITAL_COMMUNITY): Payer: Self-pay | Admitting: *Deleted

## 2018-06-14 ENCOUNTER — Other Ambulatory Visit: Payer: Self-pay

## 2018-06-14 ENCOUNTER — Inpatient Hospital Stay (HOSPITAL_COMMUNITY)
Admission: EM | Admit: 2018-06-14 | Discharge: 2018-06-18 | DRG: 917 | Disposition: A | Discharge status: Other Acute Inpatient Hospital | Payer: Self-pay | Attending: Internal Medicine | Admitting: Internal Medicine

## 2018-06-14 DIAGNOSIS — F319 Bipolar disorder, unspecified: Secondary | ICD-10-CM | POA: Diagnosis present

## 2018-06-14 DIAGNOSIS — Z86010 Personal history of colonic polyps: Secondary | ICD-10-CM

## 2018-06-14 DIAGNOSIS — R569 Unspecified convulsions: Secondary | ICD-10-CM | POA: Diagnosis present

## 2018-06-14 DIAGNOSIS — R402112 Coma scale, eyes open, never, at arrival to emergency department: Secondary | ICD-10-CM | POA: Diagnosis present

## 2018-06-14 DIAGNOSIS — Z915 Personal history of self-harm: Secondary | ICD-10-CM

## 2018-06-14 DIAGNOSIS — J9601 Acute respiratory failure with hypoxia: Secondary | ICD-10-CM | POA: Diagnosis present

## 2018-06-14 DIAGNOSIS — E119 Type 2 diabetes mellitus without complications: Secondary | ICD-10-CM | POA: Diagnosis present

## 2018-06-14 DIAGNOSIS — R402222 Coma scale, best verbal response, incomprehensible words, at arrival to emergency department: Secondary | ICD-10-CM | POA: Diagnosis present

## 2018-06-14 DIAGNOSIS — E874 Mixed disorder of acid-base balance: Secondary | ICD-10-CM | POA: Diagnosis present

## 2018-06-14 DIAGNOSIS — T1491XA Suicide attempt, initial encounter: Secondary | ICD-10-CM

## 2018-06-14 DIAGNOSIS — Z808 Family history of malignant neoplasm of other organs or systems: Secondary | ICD-10-CM

## 2018-06-14 DIAGNOSIS — T50902A Poisoning by unspecified drugs, medicaments and biological substances, intentional self-harm, initial encounter: Secondary | ICD-10-CM

## 2018-06-14 DIAGNOSIS — J9602 Acute respiratory failure with hypercapnia: Secondary | ICD-10-CM | POA: Diagnosis present

## 2018-06-14 DIAGNOSIS — N179 Acute kidney failure, unspecified: Secondary | ICD-10-CM | POA: Diagnosis present

## 2018-06-14 DIAGNOSIS — G92 Toxic encephalopathy: Secondary | ICD-10-CM | POA: Diagnosis present

## 2018-06-14 DIAGNOSIS — Z825 Family history of asthma and other chronic lower respiratory diseases: Secondary | ICD-10-CM

## 2018-06-14 DIAGNOSIS — E876 Hypokalemia: Secondary | ICD-10-CM | POA: Diagnosis present

## 2018-06-14 DIAGNOSIS — K589 Irritable bowel syndrome without diarrhea: Secondary | ICD-10-CM | POA: Diagnosis present

## 2018-06-14 DIAGNOSIS — R402312 Coma scale, best motor response, none, at arrival to emergency department: Secondary | ICD-10-CM | POA: Diagnosis present

## 2018-06-14 DIAGNOSIS — F17210 Nicotine dependence, cigarettes, uncomplicated: Secondary | ICD-10-CM | POA: Diagnosis present

## 2018-06-14 DIAGNOSIS — J690 Pneumonitis due to inhalation of food and vomit: Secondary | ICD-10-CM | POA: Diagnosis present

## 2018-06-14 DIAGNOSIS — Z888 Allergy status to other drugs, medicaments and biological substances status: Secondary | ICD-10-CM

## 2018-06-14 DIAGNOSIS — T43222A Poisoning by selective serotonin reuptake inhibitors, intentional self-harm, initial encounter: Principal | ICD-10-CM | POA: Diagnosis present

## 2018-06-14 DIAGNOSIS — Z87442 Personal history of urinary calculi: Secondary | ICD-10-CM

## 2018-06-14 DIAGNOSIS — G9340 Encephalopathy, unspecified: Secondary | ICD-10-CM | POA: Diagnosis present

## 2018-06-14 DIAGNOSIS — F411 Generalized anxiety disorder: Secondary | ICD-10-CM | POA: Diagnosis present

## 2018-06-14 DIAGNOSIS — Z801 Family history of malignant neoplasm of trachea, bronchus and lung: Secondary | ICD-10-CM

## 2018-06-14 DIAGNOSIS — Z789 Other specified health status: Secondary | ICD-10-CM

## 2018-06-14 DIAGNOSIS — J9600 Acute respiratory failure, unspecified whether with hypoxia or hypercapnia: Secondary | ICD-10-CM

## 2018-06-14 DIAGNOSIS — Z885 Allergy status to narcotic agent status: Secondary | ICD-10-CM

## 2018-06-14 LAB — URINALYSIS, ROUTINE W REFLEX MICROSCOPIC
BILIRUBIN URINE: NEGATIVE
Glucose, UA: NEGATIVE mg/dL
HGB URINE DIPSTICK: NEGATIVE
Ketones, ur: NEGATIVE mg/dL
Leukocytes, UA: NEGATIVE
Nitrite: NEGATIVE
PROTEIN: 100 mg/dL — AB
SPECIFIC GRAVITY, URINE: 1.021 (ref 1.005–1.030)
pH: 5 (ref 5.0–8.0)

## 2018-06-14 LAB — COMPREHENSIVE METABOLIC PANEL
ALK PHOS: 132 U/L — AB (ref 38–126)
ALT: 37 U/L (ref 14–54)
AST: 33 U/L (ref 15–41)
Albumin: 4.6 g/dL (ref 3.5–5.0)
Anion gap: 14 (ref 5–15)
BUN: 12 mg/dL (ref 6–20)
CALCIUM: 9.9 mg/dL (ref 8.9–10.3)
CO2: 23 mmol/L (ref 22–32)
CREATININE: 1.21 mg/dL — AB (ref 0.44–1.00)
Chloride: 109 mmol/L (ref 101–111)
GFR, EST NON AFRICAN AMERICAN: 53 mL/min — AB (ref >60)
Glucose, Bld: 139 mg/dL — ABNORMAL HIGH (ref 65–99)
Potassium: 3.6 mmol/L (ref 3.5–5.1)
Sodium: 146 mmol/L — ABNORMAL HIGH (ref 135–145)
TOTAL PROTEIN: 8.8 g/dL — AB (ref 6.5–8.1)
Total Bilirubin: 0.6 mg/dL (ref 0.3–1.2)

## 2018-06-14 LAB — I-STAT CHEM 8, ED
BUN: 12 mg/dL (ref 6–20)
Calcium, Ion: 1.22 mmol/L (ref 1.15–1.40)
Chloride: 107 mmol/L (ref 101–111)
Creatinine, Ser: 1.3 mg/dL — ABNORMAL HIGH (ref 0.44–1.00)
GLUCOSE: 131 mg/dL — AB (ref 65–99)
HEMATOCRIT: 47 % — AB (ref 36.0–46.0)
HEMOGLOBIN: 16 g/dL — AB (ref 12.0–15.0)
POTASSIUM: 3.5 mmol/L (ref 3.5–5.1)
Sodium: 147 mmol/L — ABNORMAL HIGH (ref 135–145)
TCO2: 23 mmol/L (ref 22–32)

## 2018-06-14 LAB — BLOOD GAS, ARTERIAL
Acid-base deficit: 7.8 mmol/L — ABNORMAL HIGH (ref 0.0–2.0)
Acid-base deficit: 8.2 mmol/L — ABNORMAL HIGH (ref 0.0–2.0)
BICARBONATE: 18.6 mmol/L — AB (ref 20.0–28.0)
Bicarbonate: 20.6 mmol/L (ref 20.0–28.0)
Drawn by: 11249
Drawn by: 11249
FIO2: 100
FIO2: 60
LHR: 22 {breaths}/min
O2 SAT: 98.1 %
O2 Saturation: 96.4 %
PCO2 ART: 45.6 mmHg (ref 32.0–48.0)
PEEP: 5 cmH2O
PEEP: 5 cmH2O
PH ART: 7.181 — AB (ref 7.350–7.450)
Patient temperature: 98.8
Patient temperature: 98.6
RATE: 16 resp/min
VT: 460 mL
VT: 460 mL
pCO2 arterial: 57.2 mmHg — ABNORMAL HIGH (ref 32.0–48.0)
pH, Arterial: 7.234 — ABNORMAL LOW (ref 7.350–7.450)
pO2, Arterial: 254 mmHg — ABNORMAL HIGH (ref 83.0–108.0)
pO2, Arterial: 111 mmHg — ABNORMAL HIGH (ref 83.0–108.0)

## 2018-06-14 LAB — RAPID URINE DRUG SCREEN, HOSP PERFORMED
Amphetamines: NOT DETECTED
Benzodiazepines: DETECTED — AB
Cocaine: DETECTED — AB
OPIATES: NOT DETECTED
TETRAHYDROCANNABINOL: NOT DETECTED

## 2018-06-14 LAB — PROCALCITONIN: Procalcitonin: <0.1 ng/mL

## 2018-06-14 LAB — TROPONIN I

## 2018-06-14 LAB — ETHANOL
ALCOHOL ETHYL (B): 117 mg/dL — AB (ref <10)
ALCOHOL ETHYL (B): 12 mg/dL — AB (ref <10)

## 2018-06-14 LAB — CBC
HCT: 45.3 % (ref 36.0–46.0)
Hemoglobin: 15.1 g/dL — ABNORMAL HIGH (ref 12.0–15.0)
MCH: 31.6 pg (ref 26.0–34.0)
MCHC: 33.3 g/dL (ref 30.0–36.0)
MCV: 94.8 fL (ref 78.0–100.0)
PLATELETS: 380 10*3/uL (ref 150–400)
RBC: 4.78 MIL/uL (ref 3.87–5.11)
RDW: 14.6 % (ref 11.5–15.5)
WBC: 14.2 10*3/uL — ABNORMAL HIGH (ref 4.0–10.5)

## 2018-06-14 LAB — SALICYLATE LEVEL: Salicylate Lvl: <7 mg/dL (ref 2.8–30.0)

## 2018-06-14 LAB — CBG MONITORING, ED
GLUCOSE-CAPILLARY: 128 mg/dL — AB (ref 65–99)
Glucose-Capillary: 102 mg/dL — ABNORMAL HIGH (ref 65–99)

## 2018-06-14 LAB — PHOSPHORUS: Phosphorus: 4.6 mg/dL (ref 2.5–4.6)

## 2018-06-14 LAB — CK: CK TOTAL: 104 U/L (ref 38–234)

## 2018-06-14 LAB — I-STAT BETA HCG BLOOD, ED (MC, WL, AP ONLY)

## 2018-06-14 LAB — LACTIC ACID, PLASMA: Lactic Acid, Venous: 1.7 mmol/L (ref 0.5–1.9)

## 2018-06-14 LAB — ACETAMINOPHEN LEVEL
Acetaminophen (Tylenol), Serum: <10 ug/mL — ABNORMAL LOW (ref 10–30)
Acetaminophen (Tylenol), Serum: <10 ug/mL — ABNORMAL LOW (ref 10–30)

## 2018-06-14 LAB — MAGNESIUM: Magnesium: 1.8 mg/dL (ref 1.7–2.4)

## 2018-06-14 LAB — BRAIN NATRIURETIC PEPTIDE: B NATRIURETIC PEPTIDE 5: 30.2 pg/mL (ref 0.0–100.0)

## 2018-06-14 MED ORDER — CHARCOAL ACTIVATED PO LIQD: 100.0000 g | Freq: Once | ORAL | Status: DC

## 2018-06-14 MED ORDER — LORAZEPAM 2 MG/ML IJ SOLN
INTRAMUSCULAR | Status: AC
  Administered 2018-06-14: 2 mg
  Filled 2018-06-14: qty 1

## 2018-06-14 MED ORDER — FAMOTIDINE 40 MG/5ML PO SUSR: 20.0000 mg | Freq: Two times a day (BID) | ORAL | Status: DC

## 2018-06-14 MED ORDER — RANITIDINE HCL 150 MG/10ML PO SYRP
150.0000 mg | ORAL_SOLUTION | Freq: Two times a day (BID) | ORAL | Status: DC
  Filled 2018-06-14: qty 10

## 2018-06-14 MED ORDER — MAGNESIUM SULFATE 2 GM/50ML IV SOLN
2.0000 g | Freq: Once | INTRAVENOUS | Status: AC
  Administered 2018-06-15: 2 g via INTRAVENOUS
  Filled 2018-06-14: qty 50

## 2018-06-14 MED ORDER — ALBUTEROL SULFATE (2.5 MG/3ML) 0.083% IN NEBU: 2.5000 mg | INHALATION_SOLUTION | RESPIRATORY_TRACT | Status: DC | PRN

## 2018-06-14 MED ORDER — LEVETIRACETAM IN NACL 1500 MG/100ML IV SOLN
1500.0000 mg | Freq: Once | INTRAVENOUS | Status: AC
  Administered 2018-06-15: 1500 mg via INTRAVENOUS
  Filled 2018-06-14 (×3): qty 100

## 2018-06-14 MED ORDER — INSULIN ASPART 100 UNIT/ML ~~LOC~~ SOLN
1.0000 [IU] | SUBCUTANEOUS | Status: DC
  Administered 2018-06-15 – 2018-06-16 (×3): 1 [IU] via SUBCUTANEOUS
  Administered 2018-06-17 (×3): 2 [IU] via SUBCUTANEOUS

## 2018-06-14 MED ORDER — SODIUM CHLORIDE 0.9 % IV SOLN
250.0000 mL | INTRAVENOUS | Status: DC | PRN
  Administered 2018-06-15: 250 mL via INTRAVENOUS

## 2018-06-14 MED ORDER — PIPERACILLIN-TAZOBACTAM 3.375 G IVPB
3.3750 g | Freq: Three times a day (TID) | INTRAVENOUS | Status: DC
  Administered 2018-06-15 – 2018-06-18 (×11): 3.375 g via INTRAVENOUS
  Filled 2018-06-14 (×12): qty 50

## 2018-06-14 MED ORDER — SODIUM CHLORIDE 0.9 % IV BOLUS
1000.0000 mL | Freq: Once | INTRAVENOUS | Status: AC
  Administered 2018-06-14: 1000 mL via INTRAVENOUS

## 2018-06-14 MED ORDER — ROCURONIUM BROMIDE 50 MG/5ML IV SOLN
70.0000 mg | Freq: Once | INTRAVENOUS | Status: AC
  Administered 2018-06-14: 70 mg via INTRAVENOUS

## 2018-06-14 MED ORDER — HEPARIN SODIUM (PORCINE) 5000 UNIT/ML IJ SOLN
5000.0000 [IU] | Freq: Three times a day (TID) | INTRAMUSCULAR | Status: DC
  Administered 2018-06-15 – 2018-06-18 (×11): 5000 [IU] via SUBCUTANEOUS
  Filled 2018-06-14 (×11): qty 1

## 2018-06-14 MED ORDER — POTASSIUM CHLORIDE 10 MEQ/100ML IV SOLN
10.0000 meq | INTRAVENOUS | Status: AC
  Administered 2018-06-14 – 2018-06-15 (×2): 10 meq via INTRAVENOUS
  Filled 2018-06-14 (×2): qty 100

## 2018-06-14 MED ORDER — ETOMIDATE 2 MG/ML IV SOLN
20.0000 mg | Freq: Once | INTRAVENOUS | Status: AC
  Administered 2018-06-14: 20 mg via INTRAVENOUS

## 2018-06-14 MED ORDER — IOPAMIDOL (ISOVUE-370) INJECTION 76%
100.0000 mL | Freq: Once | INTRAVENOUS | Status: AC | PRN
  Administered 2018-06-14: 80 mL via INTRAVENOUS

## 2018-06-14 MED ORDER — CHLORHEXIDINE GLUCONATE 0.12% ORAL RINSE (MEDLINE KIT)
15.0000 mL | Freq: Two times a day (BID) | OROMUCOSAL | Status: DC
  Administered 2018-06-15 – 2018-06-16 (×4): 15 mL via OROMUCOSAL

## 2018-06-14 MED ORDER — THIAMINE HCL 100 MG/ML IJ SOLN
100.0000 mg | Freq: Every day | INTRAMUSCULAR | Status: DC
  Administered 2018-06-15 – 2018-06-18 (×4): 100 mg via INTRAVENOUS
  Filled 2018-06-14 (×4): qty 2

## 2018-06-14 MED ORDER — PIPERACILLIN-TAZOBACTAM 3.375 G IVPB 30 MIN
3.3750 g | INTRAVENOUS | Status: AC
  Administered 2018-06-14: 3.375 g via INTRAVENOUS
  Filled 2018-06-14: qty 50

## 2018-06-14 MED ORDER — SODIUM BICARBONATE 8.4 % IV SOLN
50.0000 meq | Freq: Once | INTRAVENOUS | Status: AC
  Administered 2018-06-14: 50 meq via INTRAVENOUS
  Filled 2018-06-14: qty 50

## 2018-06-14 MED ORDER — MIDAZOLAM HCL 2 MG/2ML IJ SOLN
2.0000 mg | INTRAMUSCULAR | Status: DC | PRN
  Administered 2018-06-16: 2 mg via INTRAVENOUS
  Filled 2018-06-14 (×3): qty 2

## 2018-06-14 MED ORDER — SODIUM CHLORIDE 0.9 % IV BOLUS
2000.0000 mL | Freq: Once | INTRAVENOUS | Status: AC
  Administered 2018-06-14: 2000 mL via INTRAVENOUS

## 2018-06-14 MED ORDER — MIDAZOLAM HCL 2 MG/2ML IJ SOLN
2.0000 mg | INTRAMUSCULAR | Status: DC | PRN
  Administered 2018-06-16 (×2): 2 mg via INTRAVENOUS

## 2018-06-14 MED ORDER — DOCUSATE SODIUM 50 MG/5ML PO LIQD
100.0000 mg | Freq: Two times a day (BID) | ORAL | Status: DC | PRN
  Administered 2018-06-18: 100 mg
  Filled 2018-06-14 (×2): qty 10

## 2018-06-14 MED ORDER — ORAL CARE MOUTH RINSE
15.0000 mL | OROMUCOSAL | Status: DC
  Administered 2018-06-15 – 2018-06-16 (×16): 15 mL via OROMUCOSAL

## 2018-06-14 MED ORDER — FOLIC ACID 5 MG/ML IJ SOLN
1.0000 mg | Freq: Every day | INTRAMUSCULAR | Status: DC
  Administered 2018-06-15 – 2018-06-18 (×4): 1 mg via INTRAVENOUS
  Filled 2018-06-14 (×4): qty 0.2

## 2018-06-14 MED ORDER — IOPAMIDOL (ISOVUE-370) INJECTION 76%
INTRAVENOUS | Status: AC
  Filled 2018-06-14: qty 100

## 2018-06-14 NOTE — Consult Note (Signed)
NEURO HOSPITALIST CONSULT NOTE   Requestig physician: Dr. Merlene Pulling  Reason for Consult: Unresponsive with fixed, dilated pupils  History obtained from:  ICU Physician and Chart  HPI:                                                                                                                                          Stacie Sanders is an 47 y.o. female presenting to the [facility] ED via EMS unresponsive. Initially asleep on the couch but arousable at home when EMS found her; they had been called by family after patient told them that she was going to kill herself by taking pills. She was able to ambulate to ambulance with EMS but on arrival to the ED was unresponsive to painful stimuli.  Following. Several empty medication bottles were found at the scene: Buspirone, Neurontin, Paxil, Seroquel, Vistaril. Beer bottles were also found at the scene.  She has a history of bipolar disorder, depression, previous suicide attempt requiring intubation, borderline diabetes and IBS.  At 7:22 PM while in the ED the patient had a seizure with her muscles becoming rigid, extremities moving, and clenching her teeth with eyes closed. Ativan was administered. She was intubated and paralyzed, but when paralytic should have worn off, was noted to have fixed dilated pupils and continued to be unresponsive. ICU consultant suspects barbiturate overdose, but this was not one of the pill bottles and there is no lab test for this currently available at [facility] due to a reagent shortage. Tox screen was positive for cocaine. No continued twitching to suggest status epilepticus, per ICU team. CTA head/neck was ordered to rule out brainstem stroke, and was negative. She is being loaded with Keppra 1500 mg at [facility] prior to xfer to the [facility] ICU. EEG and MRI brain are planned.    Past Medical History:  Diagnosis Date  . Allergic rhinitis   . Bipolar 1 disorder (HCC)   . Borderline diabetes mellitus   . Colon polyp    . Depression   . Dyspnea on exertion   . Esophageal stricture    scheduled for EGD and dilation 03-18-2016  . GAD (generalized anxiety disorder)   . History of acute respiratory failure    12-29-2015  due to polysubstance overdose--  pt intubated --  resolved   . History of febrile seizure    x1  01/ 2017--  none since  . History of kidney stones    staghorn  . History of suicide attempt    12-29-2015  -- overdose klonopin, xanax, alcohol--  acute respirtory failure and acute encenphalopathy -- both resolved  . IBS (irritable bowel syndrome)   . Irregular menstrual cycle   . Renal calculi    bilateral    Past Surgical History:  Procedure Laterality Date  . COLONOSCOPY    . CYSTOSCOPY WITH STENT PLACEMENT Bilateral 03/10/2016   Procedure: CYSTOSCOPY WITH STENT PLACEMENT;  Surgeon: Malen Gauze, MD;  Location: [facility];  Service: Urology;  Laterality: Bilateral;  . CYSTOSCOPY/RETROGRADE/URETEROSCOPY/STONE EXTRACTION WITH BASKET Bilateral 03/10/2016   Procedure: CYSTOSCOPY/RETROGRADE/URETEROSCOPY/STONE EXTRACTION WITH BASKET;  Surgeon: Malen Gauze, MD;  Location: [facility];  Service: Urology;  Laterality: Bilateral;  . EXTRACORPOREAL SHOCK WAVE LITHOTRIPSY  x2    . PERCUTANEOUS NEPHROSTOLITHOTOMY Right 09-18-2005   staghorn  . TUBAL LIGATION  1999    Family History  Problem Relation Age of Onset  . Cancer Father        pancreas  . Melanoma Father   . Cancer Maternal Grandmother        lung  . COPD Paternal Grandmother   . Melanoma Paternal Grandmother               Social History:  reports that she has been smoking cigarettes.  She has a 10.00 pack-year smoking history. She has never used smokeless tobacco. She reports that she drinks alcohol. She reports that she does not use drugs.  Allergies  Allergen Reactions  . Fentanyl Hives and Itching  . Percocet [Oxycodone-Acetaminophen] Hives and Itching  . Robaxin  [Methocarbamol] Rash    MEDICATIONS:                                                                                                                     Prior to Admission:  Medications Prior to Admission  Medication Sig Dispense Refill Last Dose  . buPROPion (WELLBUTRIN) 100 MG tablet Take 100 mg by mouth 2 (two) times daily.     Marland Kitchen PARoxetine (PAXIL) 30 MG tablet Take 60 mg by mouth at bedtime.     Marland Kitchen QUEtiapine (SEROQUEL) 300 MG tablet Take 300 mg by mouth at bedtime.     . ciprofloxacin (CILOXAN) 0.3 % ophthalmic ointment Place into the right eye 3 (three) times daily. 3.5 g 0   . gabapentin (NEURONTIN) 400 MG capsule Take 400 mg by mouth 3 (three) times daily.    Taking  . ibuprofen (ADVIL,MOTRIN) 600 MG tablet Take 1 tablet (600 mg total) by mouth every 8 (eight) hours as needed. 30 tablet 0   . ofloxacin (OCUFLOX) 0.3 % ophthalmic solution 1 to 2 drops in the eye every 30 minutes while awake then q 4-6 hours during bedtime x2 days, then 1-2 drop every hour while awake x 4 days, then 1-2 drop 4 times daily until no pain 10 mL 0   . omeprazole (PRILOSEC OTC) 20 MG tablet Take 2 tablets (40 mg total) by mouth daily. 60 tablet 0 Taking  . Vitamin D, Ergocalciferol, (DRISDOL) 50000 units CAPS capsule Take 1 capsule (50,000 Units total) by mouth every 7 (seven) days. 16 capsule 0 Taking     ROS:  Unable to obtain due to obtundation.   Blood pressure 95/64, pulse 91, temperature (!) 96.6 F (35.9 C), resp. rate (!) 24, SpO2 99 %.   General Examination:                                                                                                       Physical Exam  HEENT-  [place]/AT. Neck supple.     Lungs-Intubated Extremities- No edema. Multiple tattoos   Neurological Examination Mental Status: GCS 6 with semipurposeful weak withdrawal to plantar  stimulation noted.  Cranial Nerves: II: Pupils 6 mm constricting to 5 mm bilaterally. No blink to threat.  III,IV, VI: Trace doll's eye reflex. No nystagmus.  V,VII: Face flaccidly symmetric. No response to tactile stimuli.  Motor/Sensory: No movement of upper extremities to sternal rub. Tone flaccid bilaterally.  Increased, spastic tone bilateral lower ext. Semipurposeful weak withdrawal to plantar stimulation noted bilaterally, less briskly on the left.  Deep Tendon Reflexes:  2+ bilateral brachioradialis and biceps.  Hyperactive patellar and achilles reflexes bilaterally.  Plantars:  Right: downgoing   Left: Mute Cerebellar/Gait: Unable to assess   Lab Results: Basic Metabolic Panel: Recent Labs  Lab 06/14/18 1903 06/14/18 1909 06/14/18 2058  NA 146* 147*  --   K 3.6 3.5  --   CL 109 107  --   CO2 23  --   --   GLUCOSE 139* 131*  --   BUN 12 12  --   CREATININE 1.21* 1.30*  --   CALCIUM 9.9  --   --   MG  --   --  1.8  PHOS  --   --  4.6    CBC: Recent Labs  Lab 06/14/18 1903 06/14/18 1909  WBC 14.2*  --   HGB 15.1* 16.0*  HCT 45.3 47.0*  MCV 94.8  --   PLT 380  --     Cardiac Enzymes: Recent Labs  Lab 06/14/18 2058  CKTOTAL 104  TROPONINI <0.03    Lipid Panel: No results for input(s): CHOL, TRIG, HDL, CHOLHDL, VLDL, LDLCALC in the last 168 hours.  Imaging: Ct Angio Head W Or Wo Contrast  Result Date: 06/14/2018 CLINICAL DATA:  47 y/o  F; concern for brainstem stroke. EXAM: CT ANGIOGRAPHY HEAD AND NECK TECHNIQUE: Multidetector CT imaging of the head and neck was performed using the standard protocol during bolus administration of intravenous contrast. Multiplanar CT image reconstructions and MIPs were obtained to evaluate the vascular anatomy. Carotid stenosis measurements (when applicable) are obtained utilizing NASCET criteria, using the distal internal carotid diameter as the denominator. CONTRAST:  80mL ISOVUE-370 IOPAMIDOL (ISOVUE-370) INJECTION 76%  COMPARISON:  06/14/2018 CT head.  01/06/2016 MRI head. FINDINGS: CTA NECK FINDINGS Aortic arch: Standard branching. Imaged portion shows no evidence of aneurysm or dissection. No significant stenosis of the major arch vessel origins. Right carotid system: No evidence of dissection, stenosis (50% or greater) or occlusion. Left carotid system: No evidence of dissection, stenosis (50% or greater) or occlusion. Vertebral arteries: Codominant. No evidence of dissection, stenosis (50% or greater) or  occlusion. Skeleton: Negative. Other neck: Endotracheal tube tip 2.3 cm above the carina. Enteric tube tip extending below the field of view into the abdomen. Upper chest: Consolidation within the dependent upper lobes, partially visualized. Review of the MIP images confirms the above findings CTA HEAD FINDINGS Anterior circulation: No significant stenosis, proximal occlusion, aneurysm, or vascular malformation. Posterior circulation: No significant stenosis, proximal occlusion, aneurysm, or vascular malformation. Venous sinuses: As permitted by contrast timing, patent. Anatomic variants: Complete circle-of-Willis. Delayed phase: No abnormal intracranial enhancement. Review of the MIP images confirms the above findings IMPRESSION: 1. Patent carotid and vertebral arteries. No dissection, aneurysm, or hemodynamically significant stenosis utilizing NASCET criteria. 2. Patent anterior and posterior intracranial circulation. No large vessel occlusion, aneurysm, or significant stenosis. 3. Partially visualized dependent consolidations within the upper lobes, suspected aspiration. These results were called by telephone at the time of interpretation on 06/14/2018 at 10:18 pm to Dr. Merlene Pulling, Who verbally acknowledged these results. Electronically Signed   By: Mitzi Hansen M.D.   On: 06/14/2018 22:21   Ct Head Wo Contrast  Result Date: 06/14/2018 CLINICAL DATA:  Altered level of consciousness. EXAM: CT HEAD WITHOUT  CONTRAST CT CERVICAL SPINE WITHOUT CONTRAST TECHNIQUE: Multidetector CT imaging of the head and cervical spine was performed following the standard protocol without intravenous contrast. Multiplanar CT image reconstructions of the cervical spine were also generated. COMPARISON:  CT scan of January 02, 2016. FINDINGS: CT HEAD FINDINGS Brain: No evidence of acute infarction, hemorrhage, hydrocephalus, extra-axial collection or mass lesion/mass effect. Vascular: No hyperdense vessel or unexpected calcification. Skull: Normal. Negative for fracture or focal lesion. Sinuses/Orbits: No acute finding. Other: None. CT CERVICAL SPINE FINDINGS Alignment: Normal. Skull base and vertebrae: No acute fracture. No primary bone lesion or focal pathologic process. Soft tissues and spinal canal: No prevertebral fluid or swelling. No visible canal hematoma. Disc levels: Mild degenerative disc disease is noted at C4-5 and C5-6 with anterior osteophyte formation. Upper chest: Negative. Other: None. IMPRESSION: Normal head CT. Mild multilevel degenerative disc disease. No acute abnormality seen in the cervical spine. Electronically Signed   By: Lupita Raider, M.D.   On: 06/14/2018 20:15   Ct Angio Neck W Or Wo Contrast  Result Date: 06/14/2018 CLINICAL DATA:  47 y/o  F; concern for brainstem stroke. EXAM: CT ANGIOGRAPHY HEAD AND NECK TECHNIQUE: Multidetector CT imaging of the head and neck was performed using the standard protocol during bolus administration of intravenous contrast. Multiplanar CT image reconstructions and MIPs were obtained to evaluate the vascular anatomy. Carotid stenosis measurements (when applicable) are obtained utilizing NASCET criteria, using the distal internal carotid diameter as the denominator. CONTRAST:  80mL ISOVUE-370 IOPAMIDOL (ISOVUE-370) INJECTION 76% COMPARISON:  06/14/2018 CT head.  01/06/2016 MRI head. FINDINGS: CTA NECK FINDINGS Aortic arch: Standard branching. Imaged portion shows no evidence  of aneurysm or dissection. No significant stenosis of the major arch vessel origins. Right carotid system: No evidence of dissection, stenosis (50% or greater) or occlusion. Left carotid system: No evidence of dissection, stenosis (50% or greater) or occlusion. Vertebral arteries: Codominant. No evidence of dissection, stenosis (50% or greater) or occlusion. Skeleton: Negative. Other neck: Endotracheal tube tip 2.3 cm above the carina. Enteric tube tip extending below the field of view into the abdomen. Upper chest: Consolidation within the dependent upper lobes, partially visualized. Review of the MIP images confirms the above findings CTA HEAD FINDINGS Anterior circulation: No significant stenosis, proximal occlusion, aneurysm, or vascular malformation. Posterior circulation: No significant stenosis, proximal occlusion,  aneurysm, or vascular malformation. Venous sinuses: As permitted by contrast timing, patent. Anatomic variants: Complete circle-of-Willis. Delayed phase: No abnormal intracranial enhancement. Review of the MIP images confirms the above findings IMPRESSION: 1. Patent carotid and vertebral arteries. No dissection, aneurysm, or hemodynamically significant stenosis utilizing NASCET criteria. 2. Patent anterior and posterior intracranial circulation. No large vessel occlusion, aneurysm, or significant stenosis. 3. Partially visualized dependent consolidations within the upper lobes, suspected aspiration. These results were called by telephone at the time of interpretation on 06/14/2018 at 10:18 pm to Dr. Merlene Pulling, Who verbally acknowledged these results. Electronically Signed   By: Mitzi Hansen M.D.   On: 06/14/2018 22:21   Ct Cervical Spine Wo Contrast  Result Date: 06/14/2018 CLINICAL DATA:  Altered level of consciousness. EXAM: CT HEAD WITHOUT CONTRAST CT CERVICAL SPINE WITHOUT CONTRAST TECHNIQUE: Multidetector CT imaging of the head and cervical spine was performed following the  standard protocol without intravenous contrast. Multiplanar CT image reconstructions of the cervical spine were also generated. COMPARISON:  CT scan of January 02, 2016. FINDINGS: CT HEAD FINDINGS Brain: No evidence of acute infarction, hemorrhage, hydrocephalus, extra-axial collection or mass lesion/mass effect. Vascular: No hyperdense vessel or unexpected calcification. Skull: Normal. Negative for fracture or focal lesion. Sinuses/Orbits: No acute finding. Other: None. CT CERVICAL SPINE FINDINGS Alignment: Normal. Skull base and vertebrae: No acute fracture. No primary bone lesion or focal pathologic process. Soft tissues and spinal canal: No prevertebral fluid or swelling. No visible canal hematoma. Disc levels: Mild degenerative disc disease is noted at C4-5 and C5-6 with anterior osteophyte formation. Upper chest: Negative. Other: None. IMPRESSION: Normal head CT. Mild multilevel degenerative disc disease. No acute abnormality seen in the cervical spine. Electronically Signed   By: Lupita Raider, M.D.   On: 06/14/2018 20:15   Dg Chest Port 1 View  Result Date: 06/14/2018 CLINICAL DATA:  Encounter for ET tube placement. EXAM: PORTABLE CHEST 1 VIEW COMPARISON:  Chest radiograph 12/10/2017. FINDINGS: ET tube 2.7 cm above carina. The heart is enlarged. There are BILATERAL pulmonary opacities which could represent edema or infiltrates. Low lung volumes. No effusion or pneumothorax. Nasogastric tube tip appears to be within the stomach. IMPRESSION: ETT 2.7 cm above carina. BILATERAL pulmonary opacities could represent edema or infiltrates. Cardiomegaly. Electronically Signed   By: Elsie Stain M.D.   On: 06/14/2018 19:47   Dg Abd Portable 1 View  Result Date: 06/14/2018 CLINICAL DATA:  47 year old female NG tube placement. EXAM: PORTABLE ABDOMEN - 1 VIEW COMPARISON:  07/21/2016 KUB. Portable chest today reported separately. FINDINGS: Portable AP supine view at 1920 hours. Enteric tube courses to the left  abdomen with side hole at the level of the gastric body. Nonobstructed visible bowel gas pattern. Abdominal visceral contours are within normal limits. No acute osseous abnormality identified. IMPRESSION: Enteric tube within the stomach, side hole at the level of the gastric body. Electronically Signed   By: Odessa Fleming M.D.   On: 06/14/2018 19:48    Assessment: 47 year old female presenting comatose with dilated pupils after overdose of multiple meds 1. Had seizure in the ED and per family 2 seizures en route. Similar presentation in 2016 with overdose.  2. Now showing some evidence for cortical activity on exam. No seizures noted. Decreased movement of LLE relative to right may reflect incidental exam finding or old stroke; per family she has had asymmetry of exam at prior 2016 admission for overdose.  3. CT negative for acute abnormality.  4. CTA with no  LVO.  5. Tox screen positive for cocaine.   Recommendations: 1. Keppra 1500 mg [facility]. Hold off on further dosing unless EEG is positive, as her seizure was provoked.  2. [facility].  3. MRI brain if EEG negative. 4. Frequent neuro checks. 5. IV hydration and other management per ICU team for probable toxidrome secondary to multiple medication overdoses in combination with cocaine use.   40 minutes spent in the neurological evaluation and management of this critically ill patient. Time spent included coordination of care.   Electronically signed: Dr. Caryl PinaEric Feige Lowdermilk 06/14/2018, 11:11 PM

## 2018-06-14 NOTE — ED Triage Notes (Addendum)
Pt arrived via EMS from home fnd sleeping on the couch. Pt told family that she was going to take pills to kill herself. On scene pt denied. Pt was able to ambulate to ambulance however upon arrival pt is not responsive to painful stimuli. Empty medication bottles of the following were fnd at scene Buspirone, Neurontin, Paxil, seroquel, Vistaril and Ethol ( beer bottles noted at scene) Pt was placed in C-collar by EMS to maintain [place], nasal trumpet in place ad pt placed on 4 lpm of oxygen.  IV 20G left hand  EMS v/s 96/69 HR 115, RR 20, CBG 83 o2 sat 90-96% with nasal trumpet/ O2 @2  lpm

## 2018-06-14 NOTE — ED Notes (Signed)
Patients son at bedside. Adelina MingsKelsey, [place] has already spoke with patients son.

## 2018-06-14 NOTE — ED Notes (Signed)
Belongings: White/Black Flip Flops, Toys ''[employer]'' UsBlue Tank Top that has been cut, floral pants that has been cut, and a black cell phone that has no cracked screen. Belongings placed in bag and labeled.

## 2018-06-14 NOTE — Progress Notes (Signed)
  Pharmacy Antibiotic Note  Stacie Sanders is a 47 y.o. female admitted on 06/14/2018 with medication overdose.  Pharmacy has been consulted for Zosyn dosing for aspiration pneumonia.  Plan:  Zosyn 3.375gm IV x 1 dose over 30 min in the ED followed by Zosyn 3.375gm IV q8h (each dose given over 4 hrs)  Need for further dosage adjustment appears unlikely at present.    Will sign off at this time.    Temp (24hrs), Avg:97.8 F (36.6 C), Min:97.8 F (36.6 C), Max:97.8 F (36.6 C)  Recent Labs  Lab 06/14/18 1903 06/14/18 1909  WBC 14.2*  --   CREATININE 1.21* 1.30*    CrCl cannot be calculated (Unknown ideal weight.).    Allergies  Allergen Reactions  . Fentanyl Hives and Itching  . Percocet [Oxycodone-Acetaminophen] Hives and Itching  . Robaxin [Methocarbamol] Rash    Thank you for allowing pharmacy to be a part of this patient's care.  Maryellen PilePoindexter, Tylerjames Hoglund Trefz, PharmD 06/14/2018 8:18 PM

## 2018-06-14 NOTE — H&P (Signed)
PULMONARY / CRITICAL CARE MEDICINE   Name: Stacie Sanders MRN: [id number] DOB: Aug 06, 1971    ADMISSION DATE:  06/14/2018 CONSULTATION DATE: 06/14/18  REFERRING MD: Dr Tegeler  CHIEF COMPLAINT: Drug Overdose, Respiratory Failure  HISTORY OF PRESENT ILLNESS:   47yoF with hx DM, Bipolar disorder, Allergic rhinitis, Anxiety, ADHD, who presents to the hospital following a drug overdose. Patient told her family she wanted to kill herself. The family called EMS who arrived in approx <15 minutes. EMS found patient lying on the couch minimally responsive. She was surrounded by bottles of buspar, neurontin, paxil, seroquel, and vistaril, as well as Etoh. She was moving on way to the ER and nasal trumpet was placed due to degree of somnolence. On arrival to the ER as ER staff was setting up for intubation, patient had a generalized tonic clonic seizure x 1-2 minutes that resolved following 2mg  Ativan IV. Patient then intubated (using etomidate and roc @ 1913) and PCCM consulted. On my exam patient unresponsive with fixed/dilated pupils, no corneals, no cough or gag, no response to sternal rub. She remains this way 2.5 hours following intubation; no additional sedation given. Patient's son is present in ER at bedside; he denies any illicit drug use. In addition to drugs stated above he says she is also taking Adderall.   PAST MEDICAL HISTORY :  She  has a past medical history of Allergic rhinitis, Bipolar 1 disorder (HCC), Borderline diabetes mellitus, Colon polyp, Depression, Dyspnea on exertion, Esophageal stricture, GAD (generalized anxiety disorder), History of acute respiratory failure, History of febrile seizure, History of kidney stones, History of suicide attempt, IBS (irritable bowel syndrome), Irregular menstrual cycle, and Renal calculi.  PAST SURGICAL HISTORY: She  has a past surgical history that includes Percutaneous nephrostolithotomy (Right, 09-18-2005); Tubal ligation (1999); Extracorporeal  shock wave lithotripsy (x2  ); Cystoscopy/retrograde/ureteroscopy/stone extraction with basket (Bilateral, 03/10/2016); Cystoscopy with stent placement (Bilateral, 03/10/2016); and Colonoscopy.  Allergies  Allergen Reactions  . Fentanyl Hives and Itching  . Percocet [Oxycodone-Acetaminophen] Hives and Itching  . Robaxin [Methocarbamol] Rash   No current facility-administered medications on file prior to encounter.    Current Outpatient Medications on File Prior to Encounter  Medication Sig  . ciprofloxacin (CILOXAN) 0.3 % ophthalmic ointment Place into the right eye 3 (three) times daily.  Marland Kitchen gabapentin (NEURONTIN) 400 MG capsule Take 400 mg by mouth 3 (three) times daily.   Marland Kitchen ibuprofen (ADVIL,MOTRIN) 600 MG tablet Take 1 tablet (600 mg total) by mouth every 8 (eight) hours as needed.  Marland Kitchen ofloxacin (OCUFLOX) 0.3 % ophthalmic solution 1 to 2 drops in the eye every 30 minutes while awake then q 4-6 hours during bedtime x2 days, then 1-2 drop every hour while awake x 4 days, then 1-2 drop 4 times daily until no pain  . omeprazole (PRILOSEC OTC) 20 MG tablet Take 2 tablets (40 mg total) by mouth daily.  Marland Kitchen PARoxetine (PAXIL) 40 MG tablet Take 40 mg by mouth daily.   . QUEtiapine (SEROQUEL) 100 MG tablet Take 200 mg by mouth at bedtime.   . Vitamin D, Ergocalciferol, (DRISDOL) 50000 units CAPS capsule Take 1 capsule (50,000 Units total) by mouth every 7 (seven) days.   FAMILY HISTORY:  Her indicated that the status of her father is unknown. She indicated that the status of her maternal grandmother is unknown. She indicated that the status of her paternal grandmother is unknown.  SOCIAL HISTORY: She  reports that she has been smoking cigarettes.  She has a  10.00 pack-year smoking history. She has never used smokeless tobacco. She reports that she drinks alcohol. She reports that she does not use drugs.  REVIEW OF SYSTEMS:   Review of Systems  Unable to perform ROS: Critical illness   SUBJECTIVE:   Intubated, Unresponsive  VITAL SIGNS: BP (!) 94/56   Pulse (!) 110 Comment: Simultaneous filing. User may not have seen previous data.  Temp 97.8 F (36.6 C) (Axillary)   Resp 18   SpO2 100%   HEMODYNAMICS:  BP 94/56 on no vasopressors   VENTILATOR SETTINGS: Vent Mode: PRVC FiO2 (%):  [100 %] 100 % Set Rate:  [16 bmp] 16 bmp Vt Set:  [460 mL] 460 mL PEEP:  [5 cmH20] 5 cmH20 Plateau Pressure:  [21 cmH20] 21 cmH20  INTAKE / OUTPUT: No intake/output data recorded.  PHYSICAL EXAMINATION: General: WDWN Adult female, Unresponsive, Intubated, Critically ill Neuro: Pupils fixed and dilated, No cough to suction of ETT; No gag; No corneal reflex. No response to sternal rub HEENT: OP clear, MM moist, Orally intubated Cardiovascular: Tachycardic to 105 with a regular rhythm, no m/r/g Lungs: CTA b/l Abdomen: Soft NTND Musculoskeletal: no LE edema Skin: skin is NOT hot or red or inappropriately dry. No rashes   LABS:  BMET Recent Labs  Lab 06/14/18 1903 06/14/18 1909  NA 146* 147*  K 3.6 3.5  CL 109 107  CO2 23  --   BUN 12 12  CREATININE 1.21* 1.30*  GLUCOSE 139* 131*   Electrolytes Recent Labs  Lab 06/14/18 1903  CALCIUM 9.9   CBC Recent Labs  Lab 06/14/18 1903 06/14/18 1909  WBC 14.2*  --   HGB 15.1* 16.0*  HCT 45.3 47.0*  PLT 380  --    Coag's No results for input(s): APTT, INR in the last 168 hours.  Sepsis Markers No results for input(s): LATICACIDVEN, PROCALCITON, O2SATVEN in the last 168 hours.  ABG Recent Labs  Lab 06/14/18 2003  PHART 7.181*  PCO2ART 57.2*  PO2ART 254*   Liver Enzymes Recent Labs  Lab 06/14/18 1903  AST 33  ALT 37  ALKPHOS 132*  BILITOT 0.6  ALBUMIN 4.6   Cardiac Enzymes No results for input(s): TROPONINI, PROBNP in the last 168 hours.  Glucose Recent Labs  Lab 06/14/18 2028  GLUCAP 102*   Imaging Ct Head Wo Contrast  Result Date: 06/14/2018 CLINICAL DATA:  Altered level of consciousness. EXAM: CT HEAD  WITHOUT CONTRAST CT CERVICAL SPINE WITHOUT CONTRAST TECHNIQUE: Multidetector CT imaging of the head and cervical spine was performed following the standard protocol without intravenous contrast. Multiplanar CT image reconstructions of the cervical spine were also generated. COMPARISON:  CT scan of January 02, 2016. FINDINGS: CT HEAD FINDINGS Brain: No evidence of acute infarction, hemorrhage, hydrocephalus, extra-axial collection or mass lesion/mass effect. Vascular: No hyperdense vessel or unexpected calcification. Skull: Normal. Negative for fracture or focal lesion. Sinuses/Orbits: No acute finding. Other: None. CT CERVICAL SPINE FINDINGS Alignment: Normal. Skull base and vertebrae: No acute fracture. No primary bone lesion or focal pathologic process. Soft tissues and spinal canal: No prevertebral fluid or swelling. No visible canal hematoma. Disc levels: Mild degenerative disc disease is noted at C4-5 and C5-6 with anterior osteophyte formation. Upper chest: Negative. Other: None. IMPRESSION: Normal head CT. Mild multilevel degenerative disc disease. No acute abnormality seen in the cervical spine. Electronically Signed   By: Lupita Raider, M.D.   On: 06/14/2018 20:15   Ct Cervical Spine Wo Contrast  Result Date: 06/14/2018  CLINICAL DATA:  Altered level of consciousness. EXAM: CT HEAD WITHOUT CONTRAST CT CERVICAL SPINE WITHOUT CONTRAST TECHNIQUE: Multidetector CT imaging of the head and cervical spine was performed following the standard protocol without intravenous contrast. Multiplanar CT image reconstructions of the cervical spine were also generated. COMPARISON:  CT scan of January 02, 2016. FINDINGS: CT HEAD FINDINGS Brain: No evidence of acute infarction, hemorrhage, hydrocephalus, extra-axial collection or mass lesion/mass effect. Vascular: No hyperdense vessel or unexpected calcification. Skull: Normal. Negative for fracture or focal lesion. Sinuses/Orbits: No acute finding. Other: None. CT CERVICAL  SPINE FINDINGS Alignment: Normal. Skull base and vertebrae: No acute fracture. No primary bone lesion or focal pathologic process. Soft tissues and spinal canal: No prevertebral fluid or swelling. No visible canal hematoma. Disc levels: Mild degenerative disc disease is noted at C4-5 and C5-6 with anterior osteophyte formation. Upper chest: Negative. Other: None. IMPRESSION: Normal head CT. Mild multilevel degenerative disc disease. No acute abnormality seen in the cervical spine. Electronically Signed   By: Lupita Raider, M.D.   On: 06/14/2018 20:15   Dg Chest Port 1 View  Result Date: 06/14/2018 CLINICAL DATA:  Encounter for ET tube placement. EXAM: PORTABLE CHEST 1 VIEW COMPARISON:  Chest radiograph 12/10/2017. FINDINGS: ET tube 2.7 cm above carina. The heart is enlarged. There are BILATERAL pulmonary opacities which could represent edema or infiltrates. Low lung volumes. No effusion or pneumothorax. Nasogastric tube tip appears to be within the stomach. IMPRESSION: ETT 2.7 cm above carina. BILATERAL pulmonary opacities could represent edema or infiltrates. Cardiomegaly. Electronically Signed   By: Elsie Stain M.D.   On: 06/14/2018 19:47   Dg Abd Portable 1 View  Result Date: 06/14/2018 CLINICAL DATA:  47 year old female NG tube placement. EXAM: PORTABLE ABDOMEN - 1 VIEW COMPARISON:  07/21/2016 KUB. Portable chest today reported separately. FINDINGS: Portable AP supine view at 1920 hours. Enteric tube courses to the left abdomen with side hole at the level of the gastric body. Nonobstructed visible bowel gas pattern. Abdominal visceral contours are within normal limits. No acute osseous abnormality identified. IMPRESSION: Enteric tube within the stomach, side hole at the level of the gastric body. Electronically Signed   By: Odessa Fleming M.D.   On: 06/14/2018 19:48   STUDIES:  CT Head/Neck w/o contrast (6/17): Normal head CT. Mild multilevel degenerative disc disease. No acute abnormality seen in the  cervical spine. CTA Head / Neck (6/17): pending  EEG (6/17): ordered   CULTURES: Blood cultures 6/17: pending Sputum culture 6/17: Pending Urine culture 6/17: pending   ANTIBIOTICS: Zosyn 6/17>>  SIGNIFICANT EVENTS: 6/17: presented to ER following drug overdose as a suicide attempt; tonic clonic sz in ER. Intubated. Now unresponsive with no neuro reflexes. Neuro concerned for brainstem infarct vs subclinical status epilepticus and asked to admit to [facility] 4N ICU  LINES/TUBES: PIV's ETT 6/17 >> Foley catheter 6/17 >> OG tube 6/17 >>  DISCUSSION: 47yoF with hx DM, Bipolar disorder, Allergic rhinitis, Anxiety, ADHD, who presents to the hospital following a drug overdose, had witnessed seizure in ER, now vent dependent, but no pupillary or other neuro reflexes.   ASSESSMENT / PLAN: PULMONARY 1. Acute hypoxic and hypercapneic respiratory failure; Aspiration pna: - intubated initially for airway protection; ABG post intubation on my review shows combination metabolic and respiratory acidosis (7.18 / 57 / 254 / 20). Increased RR from 16 to 22 and repeat ABG while improved continued to show an acidosis (7.23 / 45 / 111/ 18.6). Increased RR from 22 to  24 and ordered 1 amp Bicarb IV.  - follow-up repeat ABG - CXR on my review shows patchy bilateral infiltrates consistent with an aspiration pneumonia - pancultured, lactate 1.7, procalcitonin ordered. Start Zosyn. Swab nares for MRSA.   CARDIOVASCULAR 1. QT prolongation: - mild QT prolongation of 514 on EKG; avoid medications that prolong QT interval; repeat EKG q4hrs per rec of Poison control.  - Mg 1.8; give 2g Mag sulfate now.   RENAL 1. AKI; Hypokalemia: - creatinine 1.30, up from baseline of 0.83; foley catheter in place. UA shows no UTI. Has received 1L IVF from ER. Ordered additional 2L IVF now.  - monitor UOP and avoid nephrotoxic agents - K 3.5; replete with KCL IV once  GASTROINTESTINAL No active issues; NPO; GI  prophylaxis  HEMATOLOGIC No active issues; DVT prophylaxis  INFECTIOUS 1. Aspiration pneumonia: discussed above  ENDOCRINE 1. DM:  - NPO; SSI  NEUROLOGIC 1. Acute Encephalopathy; Seizure; Drug overdose: - reportedly was lethargic but minimally conscious when EMS arrived but on arrival to ER patient unresponsive and required intubation. Just prior to intubation patient had 1-2 tonic clonic seizure which resolved after 2mg  Ativan IV.  - on exam pupils fixed and dilated, no cough or gag, no corneals, no response to sternal rub. Patient is not breathing over the vent. She is on no sedation (last given 4hrs ago). This physical exam is very concerning for possible brain death. However, barbiturate overdoses can mimic this. Her family denies her having taken any illicit drugs, yet her UDS is positive for cocaine. So it is possible she was taking something else as well that her family is not aware of. Unfortunately cannot test for barbiturates in UDS at [facility] at this time as the lab reagent is on backorder. Will send out a comprehensive drug screen and check a phenobarbital level. While anticholinergic overdose would cause mydriasis, she does not exhibit other prodromal signs of anticholinergic overdose. Amphetamines and Cocaine (both of which she is taking) can also cause mydriasis, but she was not agitated or hypertensive on arrival a would expect with those drug overdoses. SSRI's can cause some mydriasis as well.  - it is not exactly clear what patient may have taken. Etoh was positive and UDS positive for benzos (given in ER) and cocaine. Surrounding patient or found nearby her at her house were the following prescriptions: buspar, neurontin, paxil, seroquel, vistaril - Poison control contacted by ER; they recommended EKG q4 hours and supportive care. On follow-up they initially recommended activated charcoal but then reversed their rec's saying no to activated charcoal. Salicylate level and APAP  level negative.  - OG tube placed and 1200cc bilious fluid suctioned from patient's abdomen.  - Head and Neck CT without contrast shows no abnormalities - STAT consult to Dr Otelia Limes of Neurology; he says he is concerned for possible brainstem infarct and wants a STAT CTA Head and Neck now then wants patient transferred to [facility] 4N ICU for continuous EEG. On later follow-up he recommended 1500mg  Keppra IV load but at this time patient is already en-route to [facility]. Will give the Keppra when she arrives.  - Although it is possible that patient could be a in subclinical status epilepticus, that alone would not explain her pupillary changes or all of her other neurologic findings. It is possible that it is the combination of drug overdose and seizure.    FAMILY  - Updates: updated 1 son at the bedside; another family member was present  on the son's phone and updated as well.  - Inter-disciplinary family meet or Palliative Care meeting due by: 06/20/18  60 minutes critical care time  Milana ObeyKathleen Zeniyah Peaster, MD  Pulmonary and Critical Care Medicine [facility] Pager: [phone]  06/14/2018, 9:00 PM

## 2018-06-14 NOTE — ED Notes (Addendum)
Daughter brought pill bottles in: -Paroxetine 30 mg -Buspirone 5 mg x 2 bottles -Lisinopril HCTZ 20/25 -Wellbutrin 100 mg -Seroquel 300 mg -Clorazepate 3.75 mg -Neurontin 400 mg -Visteril 25 mg -Vitamin D 1.25 mg -Phenergan 25 mg -Ibuprofen 600 mg -Remeron 15 mg -All bottles empty except phenergan, ibuprofen and remeron

## 2018-06-14 NOTE — ED Triage Notes (Signed)
Spoke with Angelique Blonderenise at [employer] who stated  To check Tylenol, Salicylate AST, ALT for liver function, EKG check  Q-T interval if greater that 500 check K+ and Mg, and replace. Repeat EKG in 4 hours. For agitation give benzodiazepines, for tachycardia give IV fluids.

## 2018-06-14 NOTE — ED Notes (Addendum)
Per Denise/Poison Control-states do not have to administer charcoal-critical care/Dr. Hammonds made aware-per toxicologist-please call with any questions

## 2018-06-14 NOTE — ED Notes (Signed)
Bed: WA15 Expected date:  Expected time:  Means of arrival:  Comments: EMS-OD 

## 2018-06-14 NOTE — ED Provider Notes (Signed)
[facility]-EMERGENCY DEPT Provider Note   CSN: [id number] Arrival date & time: 06/14/18  4098     History   Chief Complaint Chief Complaint  Patient presents with  . Drug Overdose    HPI Stacie Sanders is a 47 y.o. female.  Stacie Sanders is a 47 y.o. Female with a history of bipolar disorder, depression, previous suicide attempt requiring intubation, borderline diabetes, IBS, who presents to the emergency department via EMS for evaluation of intentional drug overdose.  Per EMS the patient called and told family she was going to take pills to kill herself, when EMS arrived they found the patient sleeping on the couch, on scene she was easily arousable, talking and able to walk to the ambulance, she denied taking anything.  In transit patient became unresponsive to painful stimuli, and has continued to remain unresponsive while here in the ED.  Empty medication bottles were found at the scene containing buspirone, Neurontin, Paxil, Seroquel, Vistaril there also several empty beer bottles noted.  In transit patient was placed in a c-collar to help maintain airway and a nasal trumpet was placed, patient put on 4 L nasal cannula.  No family present at bedside.   Level 5 caveat: Patient unresponsive     Past Medical History:  Diagnosis Date  . Allergic rhinitis   . Bipolar 1 disorder (HCC)   . Borderline diabetes mellitus   . Colon polyp   . Depression   . Dyspnea on exertion   . Esophageal stricture    scheduled for EGD and dilation 03-18-2016  . GAD (generalized anxiety disorder)   . History of acute respiratory failure    12-29-2015  due to polysubstance overdose--  pt intubated --  resolved   . History of febrile seizure    x1  01/ 2017--  none since  . History of kidney stones    staghorn  . History of suicide attempt    12-29-2015  -- overdose klonopin, xanax, alcohol--  acute respirtory failure and acute encenphalopathy -- both resolved  . IBS  (irritable bowel syndrome)   . Irregular menstrual cycle   . Renal calculi    bilateral    Patient Active Problem List   Diagnosis Date Noted  . Fatigue 03/01/2018  . Irritability 03/01/2018  . GAD (generalized anxiety disorder) 03/01/2018  . Gingivitis 03/01/2018  . Grinding of teeth 03/01/2018  . Healthcare maintenance 02/15/2018  . Depression 02/15/2018  . Bipolar 1 disorder (HCC) 02/15/2018  . Cough in adult 02/15/2018  . Acute bacterial conjunctivitis of right eye 02/15/2018  . Acute respiratory failure (HCC) 01/03/2016  . Acute respiratory failure with hypoxemia (HCC)   . Acute encephalopathy   . Drug overdose 12/29/2015  . Overdose 12/29/2015    Past Surgical History:  Procedure Laterality Date  . COLONOSCOPY    . CYSTOSCOPY WITH STENT PLACEMENT Bilateral 03/10/2016   Procedure: CYSTOSCOPY WITH STENT PLACEMENT;  Surgeon: Malen Gauze, MD;  Location: [facility];  Service: Urology;  Laterality: Bilateral;  . CYSTOSCOPY/RETROGRADE/URETEROSCOPY/STONE EXTRACTION WITH BASKET Bilateral 03/10/2016   Procedure: CYSTOSCOPY/RETROGRADE/URETEROSCOPY/STONE EXTRACTION WITH BASKET;  Surgeon: Malen Gauze, MD;  Location: [facility];  Service: Urology;  Laterality: Bilateral;  . EXTRACORPOREAL SHOCK WAVE LITHOTRIPSY  x2    . PERCUTANEOUS NEPHROSTOLITHOTOMY Right 09-18-2005   staghorn  . TUBAL LIGATION  1999     OB History    Gravida  5   Para      Term  Preterm      AB  2   Living  3     SAB  1   TAB  1   Ectopic      Multiple      Live Births               Home Medications    Prior to Admission medications   Medication Sig Start Date End Date Taking? Authorizing Provider  ciprofloxacin (CILOXAN) 0.3 % ophthalmic ointment Place into the right eye 3 (three) times daily. 02/15/18   Danford, Orpha Bur D, NP  gabapentin (NEURONTIN) 400 MG capsule Take 400 mg by mouth 3 (three) times daily.     [provider]    ibuprofen (ADVIL,MOTRIN) 600 MG tablet Take 1 tablet (600 mg total) by mouth every 8 (eight) hours as needed. 05/26/18   Opalski, Gavin Pound, DO  ofloxacin (OCUFLOX) 0.3 % ophthalmic solution 1 to 2 drops in the eye every 30 minutes while awake then q 4-6 hours during bedtime x2 days, then 1-2 drop every hour while awake x 4 days, then 1-2 drop 4 times daily until no pain 05/26/18   Opalski, Deborah, DO  omeprazole (PRILOSEC OTC) 20 MG tablet Take 2 tablets (40 mg total) by mouth daily. 01/13/16   Ghimire, Werner Lean, MD  PARoxetine (PAXIL) 40 MG tablet Take 40 mg by mouth daily.     [provider]  QUEtiapine (SEROQUEL) 100 MG tablet Take 200 mg by mouth at bedtime.  07/17/16   [provider]  Vitamin D, Ergocalciferol, (DRISDOL) 50000 units CAPS capsule Take 1 capsule (50,000 Units total) by mouth every 7 (seven) days. 03/02/18   Julaine Fusi, NP    Family History Family History  Problem Relation Age of Onset  . Cancer Father        pancreas  . Melanoma Father   . Cancer Maternal Grandmother        lung  . COPD Paternal Grandmother   . Melanoma Paternal Grandmother     Social History Social History   Tobacco Use  . Smoking status: Current Every Day Smoker    Packs/day: 0.50    Years: 20.00    Pack years: 10.00    Types: Cigarettes  . Smokeless tobacco: Never Used  Substance Use Topics  . Alcohol use: Yes    Comment: occ  . Drug use: No     Allergies   Fentanyl; Percocet [oxycodone-acetaminophen]; and Robaxin [methocarbamol]   Review of Systems Review of Systems  Unable to perform ROS: Patient unresponsive     Physical Exam Updated Vital Signs BP 99/64 Comment: Simultaneous filing. User may not have seen previous data.  Pulse (!) 110 Comment: Simultaneous filing. User may not have seen previous data.  Temp 97.8 F (36.6 C) (Axillary)   Resp 18 Comment: Simultaneous filing. User may not have seen previous data.  SpO2 92% Comment: Simultaneous  filing. User may not have seen previous data.  Physical Exam  Constitutional:  Patient unresponsive to painful stimuli, sonorous breathing with NPA and c-collar in place  HENT:  Head: Normocephalic and atraumatic.  Eyes:  Anisocoria L > R, both are dilated and sluggish  Neck: No tracheal deviation present.  C-collar in place, no ecchymosis or evidence of trauma  Cardiovascular: Regular rhythm, normal heart sounds and intact distal pulses.  Tachycardic  Pulmonary/Chest: Effort normal. No stridor. She has no wheezes. She has no rales. She exhibits no tenderness.  Abdominal: Soft. Bowel sounds are  normal. She exhibits no distension. There is no tenderness.  Musculoskeletal: She exhibits no edema or deformity.  Neurological: She is unresponsive. GCS eye subscore is 1. GCS verbal subscore is 1. GCS motor subscore is 1.  Skin: Skin is warm and dry.  Nursing note and vitals reviewed.    ED Treatments / Results  Labs (all labs ordered are listed, but only abnormal results are displayed) Labs Reviewed  CBC - Abnormal; Notable for the following components:      Result Value   WBC 14.2 (*)    Hemoglobin 15.1 (*)    All other components within normal limits  I-STAT CHEM 8, ED - Abnormal; Notable for the following components:   Sodium 147 (*)    Creatinine, Ser 1.30 (*)    Glucose, Bld 131 (*)    Hemoglobin 16.0 (*)    HCT 47.0 (*)    All other components within normal limits  COMPREHENSIVE METABOLIC PANEL  ETHANOL  SALICYLATE LEVEL  ACETAMINOPHEN LEVEL  RAPID URINE DRUG SCREEN, HOSP PERFORMED  I-STAT BETA HCG BLOOD, ED ([facility], [facility], [facility] ONLY)  CBG MONITORING, ED    EKG EKG Interpretation  Date/Time:  Monday June 14 2018 18:41:50 EDT Ventricular Rate:  112 PR Interval:    QRS Duration: 92 QT Interval:  376 QTC Calculation: 514 R Axis:   46 Text Interpretation:  Sinus tachycardia Prolonged QT interval When compared to prior, longer QTc and faster rate.  No STEMI Confirmed by  Theda Belfast ([id number]) on 06/14/2018 6:45:17 PM   Radiology Ct Head Wo Contrast  Result Date: 06/14/2018 CLINICAL DATA:  Altered level of consciousness. EXAM: CT HEAD WITHOUT CONTRAST CT CERVICAL SPINE WITHOUT CONTRAST TECHNIQUE: Multidetector CT imaging of the head and cervical spine was performed following the standard protocol without intravenous contrast. Multiplanar CT image reconstructions of the cervical spine were also generated. COMPARISON:  CT scan of January 02, 2016. FINDINGS: CT HEAD FINDINGS Brain: No evidence of acute infarction, hemorrhage, hydrocephalus, extra-axial collection or mass lesion/mass effect. Vascular: No hyperdense vessel or unexpected calcification. Skull: Normal. Negative for fracture or focal lesion. Sinuses/Orbits: No acute finding. Other: None. CT CERVICAL SPINE FINDINGS Alignment: Normal. Skull base and vertebrae: No acute fracture. No primary bone lesion or focal pathologic process. Soft tissues and spinal canal: No prevertebral fluid or swelling. No visible canal hematoma. Disc levels: Mild degenerative disc disease is noted at C4-5 and C5-6 with anterior osteophyte formation. Upper chest: Negative. Other: None. IMPRESSION: Normal head CT. Mild multilevel degenerative disc disease. No acute abnormality seen in the cervical spine. Electronically Signed   By: Lupita Raider, M.D.   On: 06/14/2018 20:15   Ct Cervical Spine Wo Contrast  Result Date: 06/14/2018 CLINICAL DATA:  Altered level of consciousness. EXAM: CT HEAD WITHOUT CONTRAST CT CERVICAL SPINE WITHOUT CONTRAST TECHNIQUE: Multidetector CT imaging of the head and cervical spine was performed following the standard protocol without intravenous contrast. Multiplanar CT image reconstructions of the cervical spine were also generated. COMPARISON:  CT scan of January 02, 2016. FINDINGS: CT HEAD FINDINGS Brain: No evidence of acute infarction, hemorrhage, hydrocephalus, extra-axial collection or mass lesion/mass effect.  Vascular: No hyperdense vessel or unexpected calcification. Skull: Normal. Negative for fracture or focal lesion. Sinuses/Orbits: No acute finding. Other: None. CT CERVICAL SPINE FINDINGS Alignment: Normal. Skull base and vertebrae: No acute fracture. No primary bone lesion or focal pathologic process. Soft tissues and spinal canal: No prevertebral fluid or swelling. No visible canal hematoma. Disc levels: Mild degenerative  disc disease is noted at C4-5 and C5-6 with anterior osteophyte formation. Upper chest: Negative. Other: None. IMPRESSION: Normal head CT. Mild multilevel degenerative disc disease. No acute abnormality seen in the cervical spine. Electronically Signed   By: Lupita Raider, M.D.   On: 06/14/2018 20:15   Dg Chest Port 1 View  Result Date: 06/14/2018 CLINICAL DATA:  Encounter for ET tube placement. EXAM: PORTABLE CHEST 1 VIEW COMPARISON:  Chest radiograph 12/10/2017. FINDINGS: ET tube 2.7 cm above carina. The heart is enlarged. There are BILATERAL pulmonary opacities which could represent edema or infiltrates. Low lung volumes. No effusion or pneumothorax. Nasogastric tube tip appears to be within the stomach. IMPRESSION: ETT 2.7 cm above carina. BILATERAL pulmonary opacities could represent edema or infiltrates. Cardiomegaly. Electronically Signed   By: Elsie Stain M.D.   On: 06/14/2018 19:47   Dg Abd Portable 1 View  Result Date: 06/14/2018 CLINICAL DATA:  47 year old female NG tube placement. EXAM: PORTABLE ABDOMEN - 1 VIEW COMPARISON:  07/21/2016 KUB. Portable chest today reported separately. FINDINGS: Portable AP supine view at 1920 hours. Enteric tube courses to the left abdomen with side hole at the level of the gastric body. Nonobstructed visible bowel gas pattern. Abdominal visceral contours are within normal limits. No acute osseous abnormality identified. IMPRESSION: Enteric tube within the stomach, side hole at the level of the gastric body. Electronically Signed   By: Odessa Fleming M.D.   On: 06/14/2018 19:48    Procedures .Critical Care Performed by: Dartha Lodge, PA-C Authorized by: Dartha Lodge, PA-C   Critical care provider statement:    Critical care time (minutes):  60   Critical care start time:  06/14/2018 7:21 PM   Critical care end time:  06/14/2018 8:06 PM   Critical care time was exclusive of:  Separately billable procedures and treating other patients   Critical care was necessary to treat or prevent imminent or life-threatening deterioration of the following conditions:  Respiratory failure, toxidrome and CNS failure or compromise   Critical care was time spent personally by me on the following activities:  Development of treatment plan with patient or surrogate, discussions with consultants, evaluation of patient's response to treatment, examination of patient, obtaining history from patient or surrogate, ordering and performing treatments and interventions, ordering and review of laboratory studies, ordering and review of radiographic studies, re-evaluation of patient's condition and review of old charts   I assumed direction of critical care for this patient from another provider in my specialty: no     (including critical care time)  Medications Ordered in ED Medications  sodium chloride 0.9 % bolus 1,000 mL (1,000 mLs Intravenous New Bag/Given 06/14/18 1918)  LORazepam (ATIVAN) 2 MG/ML injection (has no administration in time range)     Initial Impression / Assessment and Plan / ED Course  I have reviewed the triage vital signs and the nursing notes.  Pertinent labs & imaging results that were available during my care of the patient were reviewed by me and considered in my medical decision making (see chart for details).  Patient presents for drug overdose, became unresponsive to painful stimuli while in transit with EMS, patient arrives with nasal trumpet and c-collar in place to protect airway, patient was sonorous breathing, remains  unresponsive to any stimulus.  Pupils anisocoric L > R, and very sluggish, lungs clear, abdomen benign.  Patient with GCS of 3, will intubate to protect airway.  Poison control contacted who recommend checking acetaminophen and  salicylate levels, AST and ALT, and monitoring QT C on EKG, if greater than 500 replacement of K and mag as needed.  Patient had approximately 2-minute tonic-clonic seizure just prior to intubation with some desaturation, which responded well to nonrebreather, patient given 2 mg of Ativan with no further seizure activity.  Patient intubated with RSI with 20 mg of etomidate and 70 of rocuronium.  Good breath sounds present.  OG tube placed, placement confirmed with x-rays.  Given sluggish dilated pupils, CTs of the head and neck were ordered.  Critical care consulted.  Spoke with Dr. Levada SchillingSummers with critical care who accepts the patient to to critical care team, intensivist will be down to see and evaluate the patient.  CT head and C-spine are unremarkable.  Final Clinical Impressions(s) / ED Diagnoses   Final diagnoses:  Intubation of airway performed without difficulty  Intentional drug overdose, initial encounter [facility](HCC)    ED Discharge Orders    None       Legrand RamsFord, Ryson Bacha N, PA-C 06/14/18 2235    Tegeler, Canary Brimhristopher J, MD 06/15/18 1327

## 2018-06-14 NOTE — ED Notes (Signed)
Prior to administering ATIVAN, patient had a seizure with her muscle becoming rigid, extremities moving, and clenching her teeth. Eyes were closed. Seizure witnessed by Dr. Rush Landmarkegeler and nursing staff.

## 2018-06-14 NOTE — Progress Notes (Signed)
eLink Physician-Brief Progress Note Patient Name: Stacie Sanders DOB: [birth year] MRN: [id number]   Date of Service  06/14/2018  HPI/Events of Note  47yoF with DM, IBS and previous suicide attempt. Found unresponsive d/t poly substance OD + ETOH. Intubated and ventilated. Seizure in ER prior to intubation. On exam pupils fixed/dilated, no cough/gag/corneals, no response to sternal rub, not breathing over the vent. Neuro concerned for possible brainstem CVA vs Subclinical Status Epilepticus. CTA Head/Neck unrevealing. Neuro [facility]. VSS.  eICU Interventions  No new orders.      Intervention Category Evaluation Type: New Patient Evaluation  Lenell AntuSommer,Davidjames Blansett Eugene 06/14/2018, 11:53 PM

## 2018-06-15 ENCOUNTER — Inpatient Hospital Stay (HOSPITAL_COMMUNITY): Payer: Self-pay

## 2018-06-15 LAB — CBC WITH DIFFERENTIAL/PLATELET
Abs Immature Granulocytes: 0.1 10*3/uL (ref 0.0–0.1)
Basophils Absolute: 0 10*3/uL (ref 0.0–0.1)
Basophils Relative: 0 %
EOS ABS: 0.1 10*3/uL (ref 0.0–0.7)
EOS PCT: 0 %
HEMATOCRIT: 36.6 % (ref 36.0–46.0)
HEMOGLOBIN: 11.6 g/dL — AB (ref 12.0–15.0)
Immature Granulocytes: 1 %
LYMPHS ABS: 1.7 10*3/uL (ref 0.7–4.0)
Lymphocytes Relative: 13 %
MCH: 30.2 pg (ref 26.0–34.0)
MCHC: 31.7 g/dL (ref 30.0–36.0)
MCV: 95.3 fL (ref 78.0–100.0)
MONO ABS: 0.7 10*3/uL (ref 0.1–1.0)
Monocytes Relative: 5 %
Neutro Abs: 10.6 10*3/uL — ABNORMAL HIGH (ref 1.7–7.7)
Neutrophils Relative %: 81 %
Platelets: 268 10*3/uL (ref 150–400)
RBC: 3.84 MIL/uL — AB (ref 3.87–5.11)
RDW: 14.8 % (ref 11.5–15.5)
WBC: 13.2 10*3/uL — AB (ref 4.0–10.5)

## 2018-06-15 LAB — GLUCOSE, CAPILLARY
GLUCOSE-CAPILLARY: 123 mg/dL — AB (ref 65–99)
GLUCOSE-CAPILLARY: 89 mg/dL (ref 65–99)
GLUCOSE-CAPILLARY: 96 mg/dL (ref 65–99)
Glucose-Capillary: 103 mg/dL — ABNORMAL HIGH (ref 65–99)
Glucose-Capillary: 106 mg/dL — ABNORMAL HIGH (ref 65–99)
Glucose-Capillary: 114 mg/dL — ABNORMAL HIGH (ref 65–99)
Glucose-Capillary: 98 mg/dL (ref 65–99)

## 2018-06-15 LAB — POCT I-STAT 3, ART BLOOD GAS (G3+)
ACID-BASE DEFICIT: 1 mmol/L (ref 0.0–2.0)
Bicarbonate: 24.1 mmol/L (ref 20.0–28.0)
O2 Saturation: 98 %
PH ART: 7.382 (ref 7.350–7.450)
PO2 ART: 109 mmHg — AB (ref 83.0–108.0)
TCO2: 25 mmol/L (ref 22–32)
pCO2 arterial: 40.1 mmHg (ref 32.0–48.0)

## 2018-06-15 LAB — BASIC METABOLIC PANEL WITH GFR
Anion gap: 8 (ref 5–15)
BUN: 9 mg/dL (ref 6–20)
CO2: 26 mmol/L (ref 22–32)
Calcium: 9.1 mg/dL (ref 8.9–10.3)
Chloride: 113 mmol/L — ABNORMAL HIGH (ref 101–111)
Creatinine, Ser: 1.03 mg/dL — ABNORMAL HIGH (ref 0.44–1.00)
GFR calc Af Amer: >60 mL/min
GFR calc non Af Amer: >60 mL/min
Glucose, Bld: 100 mg/dL — ABNORMAL HIGH (ref 65–99)
Potassium: 4.2 mmol/L (ref 3.5–5.1)
Sodium: 147 mmol/L — ABNORMAL HIGH (ref 135–145)

## 2018-06-15 LAB — HIV ANTIBODY (ROUTINE TESTING W REFLEX): HIV SCREEN 4TH GENERATION: NONREACTIVE

## 2018-06-15 LAB — MRSA PCR SCREENING: MRSA by PCR: NEGATIVE

## 2018-06-15 LAB — MAGNESIUM: Magnesium: 2.3 mg/dL (ref 1.7–2.4)

## 2018-06-15 LAB — PHOSPHORUS: Phosphorus: 4.1 mg/dL (ref 2.5–4.6)

## 2018-06-15 LAB — CK TOTAL AND CKMB (NOT AT ARMC)
CK, MB: 1.1 ng/mL (ref 0.5–5.0)
Relative Index: INVALID (ref 0.0–2.5)
Total CK: 53 U/L (ref 38–234)

## 2018-06-15 LAB — PHENOBARBITAL LEVEL

## 2018-06-15 MED ORDER — ACETAMINOPHEN 650 MG RE SUPP
650.0000 mg | Freq: Once | RECTAL | Status: AC
  Administered 2018-06-15: 650 mg via RECTAL
  Filled 2018-06-15: qty 1

## 2018-06-15 MED ORDER — FAMOTIDINE IN NACL 20-0.9 MG/50ML-% IV SOLN
20.0000 mg | INTRAVENOUS | Status: DC
  Administered 2018-06-15 – 2018-06-17 (×3): 20 mg via INTRAVENOUS
  Filled 2018-06-15 (×3): qty 50

## 2018-06-15 NOTE — Procedures (Signed)
ELECTROENCEPHALOGRAM REPORT  Date of Study: 06/15/2018 (preliminary read by Dr. Otelia LimesLindzen per report)  Patient's Name: Stacie Sanders MRN: [id number] Date of Birth: Dec 06, 1971  Referring Provider: Dr. Caryl PinaEric Lindzen  Clinical History: This is a 47 year old woman with overdose and witnessed seizure, unresponsive  Medications: Keppra Ativan given 6 hours prior to study  Technical Summary: A multichannel digital EEG recording measured by the international 10-20 system with electrodes applied with paste and impedances below 5000 ohms performed as portable with EKG monitoring in an intubated and unresponsive patient.  Hyperventilation and photic stimulation were not performed.  The digital EEG was referentially recorded, reformatted, and digitally filtered in a variety of bipolar and referential montages for optimal display.   Description: The patient is intubated and unresponsive during the recording. There is no clear posterior dominant rhythm seen. The background consists of a moderate amount of diffuse 4-5 Hz theta and 2-3 Hz delta slowing. Sleep architecture with poorly formed vertex waves, sleep spindles, and positive occipital sharp transients of sleep (POSTS) were seen. There is a slight increase in faster frequencies with noxious stimulation. Hyperventilation and photic stimulation were not performed.  There were no epileptiform discharges or electrographic seizures seen.    EKG lead was unremarkable.  Impression: This sleep EEG is abnormal due to mild diffuse background slowing.  Clinical Correlation of the above findings indicates diffuse cerebral dysfunction that is non-specific in etiology and can be seen with hypoxic/ischemic injury, toxic/metabolic encephalopathies, neurodegenerative disorders, or medication effect.  The absence of epileptiform discharges does not rule out a clinical diagnosis of epilepsy.  Clinical correlation is advised.   Stacie Sanders, M.D.

## 2018-06-15 NOTE — Progress Notes (Signed)
[facility]. I re-evaluated her. Pupils were 8mm unreactive now 5mm reactive (left more reactive than right which is more sluggish); now has a cough in response to sternal rub. Grimaces and twitches of right thumb and BLE in response to sternal rub and other noxious stimuli. Not obeying commands. Continue to monitor closely. Neurology is aware that she has arrived on 4N.

## 2018-06-15 NOTE — Progress Notes (Signed)
Stat EEG completed at bedside, results pending.  Dr Otelia LimesLindzen reviewed.

## 2018-06-15 NOTE — Progress Notes (Addendum)
Subjective: Patient currently intubated on no sedation.  Does not answer questions and will not follow commands.  Exam: Vitals:   06/15/18 0758 06/15/18 0800  BP: 109/61 110/63  Pulse: 97 97  Resp: (!) 25 (!) 21  Temp:  99.9 F (37.7 C)  SpO2: 98% 97%    Physical Exam   HEENT-  Normocephalic, no lesions, without obvious abnormality.  Normal external eye and conjunctiva.   Extremities- Warm, dry and intact Musculoskeletal-no joint tenderness, deformity or swelling Skin-warm and dry, no hyperpigmentation, vitiligo, or suspicious lesions    Neuro:  Mental Status:  Initially when stating her name she did not wake up however with sternal rub she grimaced.  After giving her significant noxious stimuli to her arms she localized and try to hit me.  During this whole event she did not open her eyes.  Stimuli to her lower extremities she quickly withdrew.  About 3 minutes later after testing corneal reflexes when asked to open her eyes she did and when asked to lift her arms she mildly lifted her left arm but would not lift her right arm or her legs. Cranial Nerves: II: No blink to threat III,IV, VI: ptosis not present, doll's eyes present V,VII: smile symmetric, grimaces to noxious stimuli behind ear and also in periorbital region VIII: Really did not open eyes to voice but then did later  Motor: 3/5 throughout but only with noxious stimuli Sensory: Does noxious stimuli in all extremities Deep Tendon Reflexes: 2+ and symmetric throughout upper extremities with 3+ in the patella and Achilles bilaterally Plantars: Right: downgoing   Left: Mute     Medications:  Scheduled: . chlorhexidine gluconate (MEDLINE KIT)  15 mL Mouth Rinse BID  . folic acid  1 mg Intravenous Daily  . heparin injection (subcutaneous)  5,000 Units Subcutaneous Q8H  . insulin aspart  1-3 Units Subcutaneous Q4H  . iopamidol      . mouth rinse  15 mL Mouth Rinse 10 times per day  . thiamine injection  100 mg  Intravenous Daily   Continuous: . sodium chloride    . famotidine (PEPCID) IV    . piperacillin-tazobactam (ZOSYN)  IV Stopped (06/15/18 0756)    Pertinent Labs/Diagnostics: -EEG   Impression: This sleep EEG is abnormal due to mild diffuse background slowing with no epileptiform activity.   -Acetaminophen less than 10 -Phenobarbital less than 5-alcohol level was 12  Ct Angio Head W Or Wo Contrast  Result Date: 06/14/2018 CLINICAL DATA:  47 y/o  F; concern for brainstem stroke. EXAM: CT ANGIOGRAPHY HEAD AND NECK TECHNIQUE: Multidetector CT imaging of the head and neck was performed using the standard protocol during bolus administration of intravenous contrast. Multiplanar CT image reconstructions and MIPs were obtained to evaluate the vascular anatomy. Carotid stenosis measurements (when applicable) are obtained utilizing NASCET criteria, using the distal internal carotid diameter as the denominator. CONTRAST:  46m ISOVUE-370 IOPAMIDOL (ISOVUE-370) INJECTION 76% COMPARISON:  06/14/2018 CT head.  01/06/2016 MRI head. FINDINGS: CTA NECK FINDINGS Aortic arch: Standard branching. Imaged portion shows no evidence of aneurysm or dissection. No significant stenosis of the major arch vessel origins. Right carotid system: No evidence of dissection, stenosis (50% or greater) or occlusion. Left carotid system: No evidence of dissection, stenosis (50% or greater) or occlusion. Vertebral arteries: Codominant. No evidence of dissection, stenosis (50% or greater) or occlusion. Skeleton: Negative. Other neck: Endotracheal tube tip 2.3 cm above the carina. Enteric tube tip extending below the field of view into the  abdomen. Upper chest: Consolidation within the dependent upper lobes, partially visualized. Review of the MIP images confirms the above findings CTA HEAD FINDINGS Anterior circulation: No significant stenosis, proximal occlusion, aneurysm, or vascular malformation. Posterior circulation: No significant  stenosis, proximal occlusion, aneurysm, or vascular malformation. Venous sinuses: As permitted by contrast timing, patent. Anatomic variants: Complete circle-of-Willis. Delayed phase: No abnormal intracranial enhancement. Review of the MIP images confirms the above findings IMPRESSION: 1. Patent carotid and vertebral arteries. No dissection, aneurysm, or hemodynamically significant stenosis utilizing NASCET criteria. 2. Patent anterior and posterior intracranial circulation. No large vessel occlusion, aneurysm, or significant stenosis. 3. Partially visualized dependent consolidations within the upper lobes, suspected aspiration. These results were called by telephone at the time of interpretation on 06/14/2018 at 10:18 pm to Dr. Jimmey Ralph, Who verbally acknowledged these results. Electronically Signed   By: Kristine Garbe M.D.   On: 06/14/2018 22:21   Ct Head Wo Contrast  Result Date: 06/14/2018  IMPRESSION: Normal head CT. Mild multilevel degenerative disc disease. No acute abnormality seen in the cervical spine. Electronically Signed   By: Marijo Conception, M.D.   On: 06/14/2018 20:15   Ct Angio Neck W Or Wo Contrast  Result Date: 06/14/2018  IMPRESSION: 1. Patent carotid and vertebral arteries. No dissection, aneurysm, or hemodynamically significant stenosis utilizing NASCET criteria. 2. Patent anterior and posterior intracranial circulation. No large vessel occlusion, aneurysm, or significant stenosis. 3. Partially visualized dependent consolidations within the upper lobes, suspected aspiration. These results were called by telephone at the time of interpretation on 06/14/2018 at 10:18 pm to Dr. Jimmey Ralph, Who verbally acknowledged these results. Electronically Signed   By: Kristine Garbe M.D.   On: 06/14/2018 22:21   Ct Cervical Spine Wo Contrast  Result Date: 06/14/2018  IMPRESSION: Normal head CT. Mild multilevel degenerative disc disease. No acute abnormality seen in the cervical  spine. Electronically Signed   By: Marijo Conception, M.D.   On: 06/14/2018 20:15      Etta Quill PA-C [facility] [phone]   Assessment: 47 year old female with attempted suicide with polysubstances.  In route had 2 seizures and one seizure while in the ED.  Currently not show any seizure activity.  She is localizing the pain and following simple commands.  CT negative for acute abnormality.  CTA with no LVO.  Patient started with initial bolus of 1500 mg Keppra IV.    Impression: - Seizure - Polysubstance overdose   Recommendations: - recommend MRI brain.    06/15/2018, 9:27 AM  NEUROHOSPITALIST ADDENDUM Seen and examined the patient today. I have reviewed the contents of history and physical exam as documented by PA/ARNP/Resident and agree with above documentation.  I have discussed and formulated the above plan as documented. Edits to the note have been made as needed.    47 year old female with attempted suicide with polysubstances followed by 2 seizures. EEG obtained - no seizure activity. Will hold keppra has seizure were provoked. MRI brain normal. CK was normal. She piked a fever, however low suspicion for NMS - CK is normal, tone normal. Patient is withdrawing to pain.  Will likely awake gradually over next 24-48 hrs.   Karena Addison Aroor MD [facility] [phone]   If 7pm to 7am, please call on call as listed on AMION.

## 2018-06-15 NOTE — Progress Notes (Signed)
Pt with temp of 102 after receiving PR Tylenol, ICE paks applied to pt and cold cloth applied to forehead, pt current temp 100.0, cooling blanket order if temp continues to increase

## 2018-06-15 NOTE — BH Assessment (Signed)
BHH Assessment Progress Note  This Clinical research associatewriter [facility]  I called pt's nurse at [phone], informing her that pt is under IVC, then faxed documents to [phone].  [facility] to [facility] original documents to the pt's nurse.  Please note that this IVC will require a First Examination.  Please call me if you have any questions.  My office hours are Monday - Friday from 8:00 am - 4:30 pm.  Doylene Canninghomas Anshi Jalloh, MA Behavioral Health Coordinator [phone]

## 2018-06-15 NOTE — Progress Notes (Signed)
PULMONARY / CRITICAL CARE MEDICINE   Name: Stacie Sanders MRN: [id number] DOB: 06/16/1971    ADMISSION DATE:  06/14/2018 CONSULTATION DATE: 06/14/18  REFERRING MD: Dr Tegeler  CHIEF COMPLAINT: Drug Overdose, Respiratory Failure  HISTORY OF PRESENT ILLNESS:   47yoF with hx DM, Bipolar disorder, Allergic rhinitis, Anxiety, ADHD, who presents to the hospital following a drug overdose. Patient told her family she wanted to kill herself. The family called EMS who arrived in approx <15 minutes. EMS found patient lying on the couch minimally responsive. She was surrounded by bottles of buspar, neurontin, paxil, seroquel, and vistaril, as well as Etoh. She was moving on way to the ER and nasal trumpet was placed due to degree of somnolence. On arrival to the ER as ER staff was setting up for intubation, patient had a generalized tonic clonic seizure x 1-2 minutes that resolved following 2mg  Ativan IV. Patient then intubated (using etomidate and roc @ 1913) and PCCM consulted. On my exam patient unresponsive with fixed/dilated pupils, no corneals, no cough or gag, no response to sternal rub. She remains this way 2.5 hours following intubation; no additional sedation given. Patient's son is present in ER at bedside; he denies any illicit drug use. In addition to drugs stated above he says she is also taking Adderall.    SUBJECTIVE:   Had change in mental status overnight, became more awake and pupils more reactive.  This AM she is somnolent but does respond to noxious stimuli. MRI pending.   VITAL SIGNS: BP 109/61   Pulse 97   Temp 99.3 F (37.4 C)   Resp (!) 25   Wt 103.3 kg (227 lb 11.8 oz)   SpO2 98%   BMI 37.90 kg/m   HEMODYNAMICS:  BP 94/56 on no vasopressors   VENTILATOR SETTINGS: Vent Mode: PRVC FiO2 (%):  [40 %-100 %] 40 % Set Rate:  [16 bmp-24 bmp] 24 bmp Vt Set:  [460 mL] 460 mL PEEP:  [5 cmH20] 5 cmH20 Plateau Pressure:  [21 cmH20-26 cmH20] 21 cmH20  INTAKE / OUTPUT: I/O  last 3 completed shifts: In: 28.3 [IV Piggyback:28.3] Out: 1950 [Urine:750; Emesis/NG output:1200]  PHYSICAL EXAMINATION: General: WDWN Adult female, Unresponsive, Intubated, Critically ill Neuro: Somnolent.  Opens eyes to noxious stimuli but does not follow any commands HEENT: OP clear, MM moist, Orally intubated Cardiovascular: RRR, no M/R/G Lungs: CTA b/l Abdomen: Soft NTND Musculoskeletal: no LE edema Skin: skin is NOT hot or red or inappropriately dry. No rashes   LABS:  BMET Recent Labs  Lab 06/14/18 1903 06/14/18 1909  NA 146* 147*  K 3.6 3.5  CL 109 107  CO2 23  --   BUN 12 12  CREATININE 1.21* 1.30*  GLUCOSE 139* 131*   Electrolytes Recent Labs  Lab 06/14/18 1903 06/14/18 2058  CALCIUM 9.9  --   MG  --  1.8  PHOS  --  4.6   CBC Recent Labs  Lab 06/14/18 1903 06/14/18 1909  WBC 14.2*  --   HGB 15.1* 16.0*  HCT 45.3 47.0*  PLT 380  --    Coag's No results for input(s): APTT, INR in the last 168 hours.  Sepsis Markers Recent Labs  Lab 06/14/18 2050 06/14/18 2058  LATICACIDVEN 1.7  --   PROCALCITON  --  <0.10    ABG Recent Labs  Lab 06/14/18 2003 06/14/18 2202 06/15/18 0301  PHART 7.181* 7.234* 7.382  PCO2ART 57.2* 45.6 40.1  PO2ART 254* 111* 109.0*   Liver Enzymes  Recent Labs  Lab 06/14/18 1903  AST 33  ALT 37  ALKPHOS 132*  BILITOT 0.6  ALBUMIN 4.6   Cardiac Enzymes Recent Labs  Lab 06/14/18 2058  TROPONINI <0.03    Glucose Recent Labs  Lab 06/14/18 2028 06/14/18 2305 06/15/18 0001 06/15/18 0320 06/15/18 0735  GLUCAP 102* 128* 123* 89 114*   Imaging Ct Angio Head W Or Wo Contrast  Result Date: 06/14/2018 CLINICAL DATA:  47 y/o  F; concern for brainstem stroke. EXAM: CT ANGIOGRAPHY HEAD AND NECK TECHNIQUE: Multidetector CT imaging of the head and neck was performed using the standard protocol during bolus administration of intravenous contrast. Multiplanar CT image reconstructions and MIPs were obtained to  evaluate the vascular anatomy. Carotid stenosis measurements (when applicable) are obtained utilizing NASCET criteria, using the distal internal carotid diameter as the denominator. CONTRAST:  80mL ISOVUE-370 IOPAMIDOL (ISOVUE-370) INJECTION 76% COMPARISON:  06/14/2018 CT head.  01/06/2016 MRI head. FINDINGS: CTA NECK FINDINGS Aortic arch: Standard branching. Imaged portion shows no evidence of aneurysm or dissection. No significant stenosis of the major arch vessel origins. Right carotid system: No evidence of dissection, stenosis (50% or greater) or occlusion. Left carotid system: No evidence of dissection, stenosis (50% or greater) or occlusion. Vertebral arteries: Codominant. No evidence of dissection, stenosis (50% or greater) or occlusion. Skeleton: Negative. Other neck: Endotracheal tube tip 2.3 cm above the carina. Enteric tube tip extending below the field of view into the abdomen. Upper chest: Consolidation within the dependent upper lobes, partially visualized. Review of the MIP images confirms the above findings CTA HEAD FINDINGS Anterior circulation: No significant stenosis, proximal occlusion, aneurysm, or vascular malformation. Posterior circulation: No significant stenosis, proximal occlusion, aneurysm, or vascular malformation. Venous sinuses: As permitted by contrast timing, patent. Anatomic variants: Complete circle-of-Willis. Delayed phase: No abnormal intracranial enhancement. Review of the MIP images confirms the above findings IMPRESSION: 1. Patent carotid and vertebral arteries. No dissection, aneurysm, or hemodynamically significant stenosis utilizing NASCET criteria. 2. Patent anterior and posterior intracranial circulation. No large vessel occlusion, aneurysm, or significant stenosis. 3. Partially visualized dependent consolidations within the upper lobes, suspected aspiration. These results were called by telephone at the time of interpretation on 06/14/2018 at 10:18 pm to Dr. Merlene Pulling,  Who verbally acknowledged these results. Electronically Signed   By: Mitzi Hansen M.D.   On: 06/14/2018 22:21   Ct Head Wo Contrast  Result Date: 06/14/2018 CLINICAL DATA:  Altered level of consciousness. EXAM: CT HEAD WITHOUT CONTRAST CT CERVICAL SPINE WITHOUT CONTRAST TECHNIQUE: Multidetector CT imaging of the head and cervical spine was performed following the standard protocol without intravenous contrast. Multiplanar CT image reconstructions of the cervical spine were also generated. COMPARISON:  CT scan of January 02, 2016. FINDINGS: CT HEAD FINDINGS Brain: No evidence of acute infarction, hemorrhage, hydrocephalus, extra-axial collection or mass lesion/mass effect. Vascular: No hyperdense vessel or unexpected calcification. Skull: Normal. Negative for fracture or focal lesion. Sinuses/Orbits: No acute finding. Other: None. CT CERVICAL SPINE FINDINGS Alignment: Normal. Skull base and vertebrae: No acute fracture. No primary bone lesion or focal pathologic process. Soft tissues and spinal canal: No prevertebral fluid or swelling. No visible canal hematoma. Disc levels: Mild degenerative disc disease is noted at C4-5 and C5-6 with anterior osteophyte formation. Upper chest: Negative. Other: None. IMPRESSION: Normal head CT. Mild multilevel degenerative disc disease. No acute abnormality seen in the cervical spine. Electronically Signed   By: Lupita Raider, M.D.   On: 06/14/2018 20:15   Ct Angio Neck  W Or Wo Contrast  Result Date: 06/14/2018 CLINICAL DATA:  47 y/o  F; concern for brainstem stroke. EXAM: CT ANGIOGRAPHY HEAD AND NECK TECHNIQUE: Multidetector CT imaging of the head and neck was performed using the standard protocol during bolus administration of intravenous contrast. Multiplanar CT image reconstructions and MIPs were obtained to evaluate the vascular anatomy. Carotid stenosis measurements (when applicable) are obtained utilizing NASCET criteria, using the distal internal carotid  diameter as the denominator. CONTRAST:  80mL ISOVUE-370 IOPAMIDOL (ISOVUE-370) INJECTION 76% COMPARISON:  06/14/2018 CT head.  01/06/2016 MRI head. FINDINGS: CTA NECK FINDINGS Aortic arch: Standard branching. Imaged portion shows no evidence of aneurysm or dissection. No significant stenosis of the major arch vessel origins. Right carotid system: No evidence of dissection, stenosis (50% or greater) or occlusion. Left carotid system: No evidence of dissection, stenosis (50% or greater) or occlusion. Vertebral arteries: Codominant. No evidence of dissection, stenosis (50% or greater) or occlusion. Skeleton: Negative. Other neck: Endotracheal tube tip 2.3 cm above the carina. Enteric tube tip extending below the field of view into the abdomen. Upper chest: Consolidation within the dependent upper lobes, partially visualized. Review of the MIP images confirms the above findings CTA HEAD FINDINGS Anterior circulation: No significant stenosis, proximal occlusion, aneurysm, or vascular malformation. Posterior circulation: No significant stenosis, proximal occlusion, aneurysm, or vascular malformation. Venous sinuses: As permitted by contrast timing, patent. Anatomic variants: Complete circle-of-Willis. Delayed phase: No abnormal intracranial enhancement. Review of the MIP images confirms the above findings IMPRESSION: 1. Patent carotid and vertebral arteries. No dissection, aneurysm, or hemodynamically significant stenosis utilizing NASCET criteria. 2. Patent anterior and posterior intracranial circulation. No large vessel occlusion, aneurysm, or significant stenosis. 3. Partially visualized dependent consolidations within the upper lobes, suspected aspiration. These results were called by telephone at the time of interpretation on 06/14/2018 at 10:18 pm to Dr. Merlene Pulling, Who verbally acknowledged these results. Electronically Signed   By: Mitzi Hansen M.D.   On: 06/14/2018 22:21   Ct Cervical Spine Wo  Contrast  Result Date: 06/14/2018 CLINICAL DATA:  Altered level of consciousness. EXAM: CT HEAD WITHOUT CONTRAST CT CERVICAL SPINE WITHOUT CONTRAST TECHNIQUE: Multidetector CT imaging of the head and cervical spine was performed following the standard protocol without intravenous contrast. Multiplanar CT image reconstructions of the cervical spine were also generated. COMPARISON:  CT scan of January 02, 2016. FINDINGS: CT HEAD FINDINGS Brain: No evidence of acute infarction, hemorrhage, hydrocephalus, extra-axial collection or mass lesion/mass effect. Vascular: No hyperdense vessel or unexpected calcification. Skull: Normal. Negative for fracture or focal lesion. Sinuses/Orbits: No acute finding. Other: None. CT CERVICAL SPINE FINDINGS Alignment: Normal. Skull base and vertebrae: No acute fracture. No primary bone lesion or focal pathologic process. Soft tissues and spinal canal: No prevertebral fluid or swelling. No visible canal hematoma. Disc levels: Mild degenerative disc disease is noted at C4-5 and C5-6 with anterior osteophyte formation. Upper chest: Negative. Other: None. IMPRESSION: Normal head CT. Mild multilevel degenerative disc disease. No acute abnormality seen in the cervical spine. Electronically Signed   By: Lupita Raider, M.D.   On: 06/14/2018 20:15   Dg Chest Port 1 View  Result Date: 06/15/2018 CLINICAL DATA:  Respiratory failure. EXAM: PORTABLE CHEST 1 VIEW COMPARISON:  06/14/2018.  12/10/2017. FINDINGS: Endotracheal tube, NG tube in stable position. Heart size normal. Mild bibasilar subsegmental atelectasis. No pleural effusion or pneumothorax. IMPRESSION: 1.  Endotracheal tube and NG tube stable position. 2.  Mild bibasilar subsegmental atelectasis. Electronically Signed   By: Maisie Fus  Register   On: 06/15/2018 06:58   Dg Chest Port 1 View  Result Date: 06/14/2018 CLINICAL DATA:  Encounter for ET tube placement. EXAM: PORTABLE CHEST 1 VIEW COMPARISON:  Chest radiograph 12/10/2017.  FINDINGS: ET tube 2.7 cm above carina. The heart is enlarged. There are BILATERAL pulmonary opacities which could represent edema or infiltrates. Low lung volumes. No effusion or pneumothorax. Nasogastric tube tip appears to be within the stomach. IMPRESSION: ETT 2.7 cm above carina. BILATERAL pulmonary opacities could represent edema or infiltrates. Cardiomegaly. Electronically Signed   By: Elsie Stain M.D.   On: 06/14/2018 19:47   Dg Abd Portable 1 View  Result Date: 06/14/2018 CLINICAL DATA:  47 year old female NG tube placement. EXAM: PORTABLE ABDOMEN - 1 VIEW COMPARISON:  07/21/2016 KUB. Portable chest today reported separately. FINDINGS: Portable AP supine view at 1920 hours. Enteric tube courses to the left abdomen with side hole at the level of the gastric body. Nonobstructed visible bowel gas pattern. Abdominal visceral contours are within normal limits. No acute osseous abnormality identified. IMPRESSION: Enteric tube within the stomach, side hole at the level of the gastric body. Electronically Signed   By: Odessa Fleming M.D.   On: 06/14/2018 19:48   STUDIES:  CT Head/Neck w/o contrast (6/17): Normal head CT. Mild multilevel degenerative disc disease. No acute abnormality seen in the cervical spine. CTA Head / Neck (6/17): pending  EEG (6/17): ordered  MRI brain 6/18 >   CULTURES: Blood cultures 6/17: pending Sputum culture 6/17: Pending Urine culture 6/17: pending   ANTIBIOTICS: Zosyn 6/17>>  SIGNIFICANT EVENTS: 6/17: presented to ER following drug overdose as a suicide attempt; tonic clonic sz in ER. Intubated. Now unresponsive with no neuro reflexes. Neuro concerned for brainstem infarct vs subclinical status epilepticus and asked to admit to [facility] 4N ICU  LINES/TUBES: PIV's ETT 6/17 >> Foley catheter 6/17 >> OG tube 6/17 >>  DISCUSSION: 47yoF with hx DM, Bipolar disorder, Allergic rhinitis, Anxiety, ADHD, who presents to the hospital following a drug overdose, had witnessed  seizure in ER, now vent dependent, but no pupillary or other neuro reflexes.   ASSESSMENT / PLAN:  PULMONARY Acute hypoxic and hypercapneic respiratory failure Aspiration PNA Continue full vent support No wean this AM due to mental status + MRI planned Continue zosyn Bronchial hygiene Follow CXR  CARDIOVASCULAR QT prolongation - from medication overdose Avoid medications that prolong QT interval; repeat EKG q4hrs per rec of Poison control.   RENAL AKI - creatinine 1.30, up from baseline of 0.83; foley catheter in place. UA shows no UTI Monitor UOP and avoid nephrotoxic agents Follow BMP  GASTROINTESTINAL GI prophylaxis Famotidine  HEMATOLOGIC DVT prophylaxis SCD's / heparin  ENDOCRINE DM SSI  NEUROLOGIC Drug overdose - multiple pills.  UDS positive for cocaine and benzo's. Salicylate and APAP negative.  Poison control contacted by ER; they recommended EKG q4 hours and supportive care. Acute Encephalopathy - required intubation in ED Seizure Continue supportive care Sedation:  Midazolam PRN RASS goal: 0 to -1 Daily [facility] Neuro following, appreciate the assistance AED's per neuro   FAMILY  - Updates: updated 1 son at the bedside on admission 6/17; another family member was present on the son's phone and updated as well.   No family at bedside 6/18. - Inter-disciplinary family meet or Palliative Care meeting due by: 06/20/18.  CC time: 30 min.   Rutherford Guys, PA - [facility] [place] [facility] Pager: [phone]  or [phone] 06/15/2018, 8:25 AM

## 2018-06-15 NOTE — Progress Notes (Signed)
Patient transported to MRI on vent with 100% FiO2 with no complications.  Patient tolerated procedure well.  Transported back to unit on vent with 100% FiO2 with no complications.

## 2018-06-16 LAB — GLUCOSE, CAPILLARY
GLUCOSE-CAPILLARY: 119 mg/dL — AB (ref 65–99)
GLUCOSE-CAPILLARY: 130 mg/dL — AB (ref 65–99)
Glucose-Capillary: 105 mg/dL — ABNORMAL HIGH (ref 65–99)
Glucose-Capillary: 119 mg/dL — ABNORMAL HIGH (ref 65–99)
Glucose-Capillary: 146 mg/dL — ABNORMAL HIGH (ref 65–99)
Glucose-Capillary: 153 mg/dL — ABNORMAL HIGH (ref 65–99)

## 2018-06-16 LAB — PROCALCITONIN: Procalcitonin: <0.1 ng/mL

## 2018-06-16 LAB — LACTIC ACID, PLASMA: Lactic Acid, Venous: 1.2 mmol/L (ref 0.5–1.9)

## 2018-06-16 MED ORDER — DEXMEDETOMIDINE HCL IN NACL 400 MCG/100ML IV SOLN
0.4000 ug/kg/h | INTRAVENOUS | Status: DC
  Administered 2018-06-16 (×2): 0.4 ug/kg/h via INTRAVENOUS
  Filled 2018-06-16 (×3): qty 100

## 2018-06-16 MED ORDER — RACEPINEPHRINE HCL 2.25 % IN NEBU
INHALATION_SOLUTION | RESPIRATORY_TRACT | Status: AC
  Filled 2018-06-16: qty 0.5

## 2018-06-16 MED ORDER — METHYLPREDNISOLONE SODIUM SUCC 40 MG IJ SOLR
40.0000 mg | Freq: Four times a day (QID) | INTRAMUSCULAR | Status: AC
  Administered 2018-06-16 – 2018-06-17 (×4): 40 mg via INTRAVENOUS
  Filled 2018-06-16 (×4): qty 1

## 2018-06-16 MED ORDER — RACEPINEPHRINE HCL 2.25 % IN NEBU
0.5000 mL | INHALATION_SOLUTION | Freq: Once | RESPIRATORY_TRACT | Status: AC
  Administered 2018-06-16: 0.5 mL via RESPIRATORY_TRACT

## 2018-06-16 MED ORDER — WHITE PETROLATUM EX OINT
TOPICAL_OINTMENT | CUTANEOUS | Status: AC
  Filled 2018-06-16: qty 28.35

## 2018-06-16 NOTE — Procedures (Signed)
Extubation Procedure Note  Patient Details:   Name: Gracy Racerracy Leigh Kanode DOB: [birth year] MRN: [id number]   Airway Documentation:    Vent end date: 06/16/18 Vent end time: 1500   Evaluation  O2 sats: stable throughout Complications: No apparent complications Patient did tolerate procedure well. Bilateral Breath Sounds: Clear   Yes  Rance MuirKayla N Hacker 06/16/2018, 3:09 PM

## 2018-06-16 NOTE — Progress Notes (Signed)
Patient able to reach using incentive spirometer.

## 2018-06-16 NOTE — Progress Notes (Signed)
Patient extubated in the afternoon Has mild stridor post extubation with increased work of breathing.    Blood pressure 92/63, pulse 80, temperature 99.9 F (37.7 C), resp. rate (!) 24, weight 223 lb 5.2 oz (101.3 kg), SpO2 96 %. Gen:   Mild distress HEENT:  EOMI, sclera anicteric, stridor Neck:     No masses; no thyromegaly Lungs:    Clear to auscultation bilaterally; normal respiratory effort CV:         Regular rate and rhythm; no murmurs Abd:      + bowel sounds; soft, non-tender; no palpable masses, no distension Ext:    No edema; adequate peripheral perfusion Skin:      Warm and dry; no rash Neuro: Somnolent, arousable  Assessment/plan Post expiration stridor.  No imminent need for reintubation.  Supplemental oxygen Nebulized epi x 1 Start IV steroids Monitor respiratory status.  The patient is critically ill with multiple organ system failure and requires high complexity decision making for assessment and support, frequent evaluation and titration of therapies, advanced monitoring, review of radiographic studies and interpretation of complex data.   Additional Critical Care Time devoted to patient care services, exclusive of separately billable procedures, described in this note is 35 minutes.   Chilton GreathousePraveen Attikus Bartoszek MD [facility] Pager [phone] If no answer or after 3pm call: [phone] 06/16/2018, 3:44 PM

## 2018-06-16 NOTE — Progress Notes (Signed)
PULMONARY / CRITICAL CARE MEDICINE   Name: Stacie Sanders Leigh Bertone MRN: [id number] DOB: 17-Jun-1971    ADMISSION DATE:  06/14/2018 CONSULTATION DATE: 06/14/18  REFERRING MD: Dr Tegeler  CHIEF COMPLAINT: Drug Overdose, Respiratory Failure  HISTORY OF PRESENT ILLNESS:   47yoF with hx DM, Bipolar disorder, Allergic rhinitis, Anxiety, ADHD, who presents to the hospital following a drug overdose. Patient told her family she wanted to kill herself. The family called EMS who arrived in approx <15 minutes. EMS found patient lying on the couch minimally responsive. She was surrounded by bottles of buspar, neurontin, paxil, seroquel, and vistaril, as well as Etoh. She was moving on way to the ER and nasal trumpet was placed due to degree of somnolence. On arrival to the ER as ER staff was setting up for intubation, patient had a generalized tonic clonic seizure x 1-2 minutes that resolved following 2mg  Ativan IV. Patient then intubated (using etomidate and roc @ 1913) and PCCM consulted. On my exam patient unresponsive with fixed/dilated pupils, no corneals, no cough or gag, no response to sternal rub. She remains this way 2.5 hours following intubation; no additional sedation given. Patient's son is present in ER at bedside; he denies any illicit drug use. In addition to drugs stated above he says she is also taking Adderall.    SUBJECTIVE:  More awake today.  She took a swing at the nurse Currently sedated after given Versed  VITAL SIGNS: BP (!) 98/52   Pulse 90   Temp 99.1 F (37.3 C)   Resp (!) 21   Wt 223 lb 5.2 oz (101.3 kg)   SpO2 98%   BMI 37.16 kg/m   HEMODYNAMICS:  BP 94/56 on no vasopressors   VENTILATOR SETTINGS: Vent Mode: CPAP;PSV FiO2 (%):  [40 %] 40 % Set Rate:  [24 bmp] 24 bmp Vt Set:  [460 mL] 460 mL PEEP:  [5 cmH20] 5 cmH20 Pressure Support:  [5 cmH20] 5 cmH20 Plateau Pressure:  [18 cmH20-22 cmH20] 20 cmH20  INTAKE / OUTPUT: I/O last 3 completed shifts: In: 1389  [I.V.:55.4; IV Piggyback:1333.6] Out: 3725 [Urine:2525; Emesis/NG output:1200]  PHYSICAL EXAMINATION: Gen:      No acute distress HEENT:  EOMI, sclera anicteric Neck:     No masses; no thyromegaly, ET tube Lungs:    Clear to auscultation bilaterally; normal respiratory effort CV:         Regular rate and rhythm; no murmurs Abd:      + bowel sounds; soft, non-tender; no palpable masses, no distension Ext:    No edema; adequate peripheral perfusion Skin:      Warm and dry; no rash Neuro: Sedated, opens eyes to commands.  LABS:  BMET Recent Labs  Lab 06/14/18 1903 06/14/18 1909 06/15/18 1111  NA 146* 147* 147*  K 3.6 3.5 4.2  CL 109 107 113*  CO2 23  --  26  BUN 12 12 9   CREATININE 1.21* 1.30* 1.03*  GLUCOSE 139* 131* 100*   Electrolytes Recent Labs  Lab 06/14/18 1903 06/14/18 2058 06/15/18 1111  CALCIUM 9.9  --  9.1  MG  --  1.8 2.3  PHOS  --  4.6 4.1   CBC Recent Labs  Lab 06/14/18 1903 06/14/18 1909 06/15/18 1111  WBC 14.2*  --  13.2*  HGB 15.1* 16.0* 11.6*  HCT 45.3 47.0* 36.6  PLT 380  --  268   Coag's No results for input(s): APTT, INR in the last 168 hours.  Sepsis Markers Recent Labs  Lab 06/14/18 2050 06/14/18 2058 06/15/18 2328 06/16/18 0225  LATICACIDVEN 1.7  --  1.2  --   PROCALCITON  --  <0.10  --  <0.10    ABG Recent Labs  Lab 06/14/18 2003 06/14/18 2202 06/15/18 0301  PHART 7.181* 7.234* 7.382  PCO2ART 57.2* 45.6 40.1  PO2ART 254* 111* 109.0*   Liver Enzymes Recent Labs  Lab 06/14/18 1903  AST 33  ALT 37  ALKPHOS 132*  BILITOT 0.6  ALBUMIN 4.6   Cardiac Enzymes Recent Labs  Lab 06/14/18 2058  TROPONINI <0.03    Glucose Recent Labs  Lab 06/15/18 1125 06/15/18 1543 06/15/18 1937 06/15/18 2332 06/16/18 0315 06/16/18 0739  GLUCAP 106* 98 96 103* 119* 130*   Imaging Mr Brain Wo Contrast  Result Date: 06/15/2018 CLINICAL DATA:  Coma.  Attempted suicide. EXAM: MRI HEAD WITHOUT CONTRAST TECHNIQUE:  Multiplanar, multiecho pulse sequences of the brain and surrounding structures were obtained without intravenous contrast. COMPARISON:  Head CT and CTA from yesterday.  Brain MRI 01/06/2016 FINDINGS: Brain: No acute infarction, hemorrhage, hydrocephalus, extra-axial collection or mass lesion. No evidence of cortical swelling or elevated intracranial pressure. 2 or 3 FLAIR hyperintensities in the cerebral white matter, allowable for age. Vascular: Major flow voids are preserved. 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 Skull and upper cervical spine: Negative for marrow lesion Sinuses/Orbits: Negative orbits. Mild mucosal thickening in the paranasal sinuses. Small volume nasopharyngeal fluid in the setting of intubation. IMPRESSION: Unremarkable MRI of the brain. Electronically Signed   By: Marnee Spring M.D.   On: 06/15/2018 15:39   STUDIES:  CT Head/Neck w/o contrast (6/17): Normal head CT. Mild multilevel degenerative disc disease. No acute abnormality seen in the cervical spine. CTA Head / Neck (6/17): pending  EEG (6/17): ordered  MRI brain 6/18 >   CULTURES: Blood cultures 6/17: pending Sputum culture 6/17: Pending Urine culture 6/17: pending   ANTIBIOTICS: Zosyn 6/17>>  SIGNIFICANT EVENTS: 6/17: presented to ER following drug overdose as a suicide attempt; tonic clonic sz in ER. Intubated. Now unresponsive with no neuro reflexes. Neuro concerned for brainstem infarct vs subclinical status epilepticus and asked to admit to [facility] 4N ICU  LINES/TUBES: PIV's ETT 6/17 >> Foley catheter 6/17 >> OG tube 6/17 >>  DISCUSSION: 47yoF with hx DM, Bipolar disorder, Allergic rhinitis, Anxiety, ADHD, who presents to the hospital following a drug overdose, had witnessed seizure in ER, now vent dependent, but no pupillary or other neuro reflexes.  ASSESSMENT / PLAN:  PULMONARY Acute hypoxic and hypercapneic respiratory failure Aspiration PNA On weaning  trial.  Hopeful for extubation today Continue Zosyn Follow chest x-ray.  CARDIOVASCULAR QT prolongation - from medication overdose qtc stable.   RENAL AKI - creatinine 1.30, up from baseline of 0.83; foley catheter in place. UA shows no UTI Follow urine output and creatinine  GASTROINTESTINAL GI prophylaxis Famotidine  HEMATOLOGIC DVT prophylaxis SCDs/heparin  ENDOCRINE DM SSI coverage  NEUROLOGIC Drug overdose - multiple pills.  UDS positive for cocaine and benzo's. Salicylate and APAP negative.  Poison control contacted by ER; they recommended EKG q4 hours and supportive care. Acute Encephalopathy - required intubation in ED Seizure Versed as needed Start Precedex for agitation MRI reviewed-normal Continue antiepileptics per neuro We will need psychiatric consult after extubation.   FAMILY  - Updates: updated 1 son at the bedside on admission 6/17; another family member was present on the son's phone and updated as well.   No family at bedside 6/18.  6/19 - Inter-disciplinary family meet or Palliative Care meeting due by: 06/20/18.  The patient is critically ill with multiple organ system failure and requires high complexity decision making for assessment and support, frequent evaluation and titration of therapies, advanced monitoring, review of radiographic studies and interpretation of complex data.   Critical Care Time devoted to patient care services, exclusive of separately billable procedures, described in this note is 35 minutes.   Chilton Greathouse MD  Pulmonary and Critical Care 06/16/2018, 9:25 AM

## 2018-06-16 NOTE — Progress Notes (Signed)
MD notified of desating and stridor development. Patient places on HFNC 15L.

## 2018-06-16 NOTE — Progress Notes (Addendum)
Subjective: Patient is improved today as far as mental status.  She now opens her eyes to voice.  I actually told her  Her when stating her name upon walking in.  She is very agitated.  Patient will follow commands such as raising her arms, raising her legs, attempting to speak and say something however she is intubated.  Exam: Vitals:   06/16/18 0800 06/16/18 0900  BP: 105/78 (!) 98/52  Pulse: 87 90  Resp: (!) 24 (!) 21  Temp: 99.1 F (37.3 C) 99.1 F (37.3 C)  SpO2: 94% 98%    Physical Exam   HEENT-  Normocephalic, no lesions, without obvious abnormality.  Normal external eye and conjunctiva.   Musculoskeletal-no joint tenderness, deformity or swelling Skin-warm and dry, no hyperpigmentation, vitiligo, or suspicious lesions    Neuro:  Mental Status: Intubated but breathing over the vent.  As noted above she can follow commands that are simple such as raising her legs, raising her arms, she is trying to speak however she is intubated and very frustrated.  She is very agitated at times will try to hit practitioner. Cranial Nerves: II: Blinks to threat III,IV, VI: Tends to keep her eyes closed.  Would not look left or right.  Pupils equal round reactive V,VII face is symmetric I could not gather a sensation that she got extremely agitated VIII: hearing normal bilaterally  Motor: Moving all extremity 5 out of 5 Sensory: Positive pain in all extremities--she is showing polymyoclonus at rest, in addition when she does move it is spastic. Deep Tendon Reflexes: 2+ and symmetric throughout upper extremities, bilateral knee jerks are extremely brisk 3+ with clonus, bilateral ankle jerks are 2+ Plantars: Right: downgoing   Left: downgoing Cerebellar: Could not get patient to do finger-nose or heel-to-shin     Medications:  Scheduled: . chlorhexidine gluconate (MEDLINE KIT)  15 mL Mouth Rinse BID  . folic acid  1 mg Intravenous Daily  . heparin injection (subcutaneous)  5,000  Units Subcutaneous Q8H  . insulin aspart  1-3 Units Subcutaneous Q4H  . mouth rinse  15 mL Mouth Rinse 10 times per day  . thiamine injection  100 mg Intravenous Daily    Pertinent Labs/Diagnostics: Sodium 147 Chloride 113 Creatinine 1.03 White blood cell count 13.2  EEG done yesterday shows abnormal due to mild diffuse background slowing however no epileptiform discharges.  Mr Brain Wo Contrast  Result Date: 06/15/2018 CLINICAL DATA:  Coma.  Attempted suicide. EXAM: MRI HEAD WITHOUT CONTRAST TECHNIQUE: Multiplanar, multiecho pulse sequences of the brain and surrounding structures were obtained without intravenous contrast. COMPARISON:  Head CT and CTA from yesterday.  Brain MRI 01/06/2016 FINDINGS: Brain: No acute infarction, hemorrhage, hydrocephalus, extra-axial collection or mass lesion. No evidence of cortical swelling or elevated intracranial pressure. 2 or 3 FLAIR hyperintensities in the cerebral white matter, allowable for age. Vascular: Major flow voids are preserved. 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 Skull and upper cervical spine: Negative for marrow lesion Sinuses/Orbits: Negative orbits. Mild mucosal thickening in the paranasal sinuses. Small volume nasopharyngeal fluid in the setting of intubation. IMPRESSION: Unremarkable MRI of the brain. Electronically Signed   By: Monte Fantasia M.D.   On: 06/15/2018 15:39      Etta Quill PA-C [facility] [phone]   Assessment: 47 year old female who attempted suicide with polysubstance abuse.  In route she had 2 seizures and one seizure while in the ED.  As noted above EEG showed no active seizure activity.  While in the hospital she has had no further seizures.  She is improving as far as mentation as noted above.  MRI shows no acute abnormality.  PCCM planning on increasing Versed secondary to agitation   Impression:  -Seizure -Polysubstance  overdose  Recommendations: - At this time no further recommendations and patient will gradually wake over the next few days.  Per family she is very agitated when awakes as she has had this issue prior.   Patient having mildly increased temp and increased reflexes, watch for NMS, Agree with PRN sedation.    06/16/2018, 9:53 AM   NEUROHOSPITALIST ADDENDUM Seen and examined the patient today. I have reviewed the contents of history and physical exam as documented by PA/ARNP/Resident and agree with above documentation.  I have discussed and formulated the above plan as documented. Edits to the note have been made as needed.  Patient having mildly increased temp and increased reflexes, watch for NMS, Agree with PRN sedation. May consider benzo taper for agitation.      Karena Addison Drelyn Pistilli MD [facility] [phone]   If 7pm to 7am, please call on call as listed on AMION.

## 2018-06-17 ENCOUNTER — Other Ambulatory Visit (HOSPITAL_COMMUNITY): Payer: Medicaid Other

## 2018-06-17 ENCOUNTER — Inpatient Hospital Stay (HOSPITAL_COMMUNITY): Payer: Self-pay

## 2018-06-17 DIAGNOSIS — I517 Cardiomegaly: Secondary | ICD-10-CM

## 2018-06-17 DIAGNOSIS — T1491XA Suicide attempt, initial encounter: Secondary | ICD-10-CM

## 2018-06-17 LAB — GLUCOSE, CAPILLARY
GLUCOSE-CAPILLARY: 156 mg/dL — AB (ref 65–99)
Glucose-Capillary: 157 mg/dL — ABNORMAL HIGH (ref 65–99)

## 2018-06-17 LAB — BASIC METABOLIC PANEL
Anion gap: 13 (ref 5–15)
BUN: 12 mg/dL (ref 6–20)
CHLORIDE: 105 mmol/L (ref 101–111)
CO2: 25 mmol/L (ref 22–32)
CREATININE: 0.92 mg/dL (ref 0.44–1.00)
Calcium: 9.8 mg/dL (ref 8.9–10.3)
GFR calc Af Amer: >60 mL/min (ref >60)
GFR calc non Af Amer: >60 mL/min (ref >60)
Glucose, Bld: 158 mg/dL — ABNORMAL HIGH (ref 65–99)
POTASSIUM: 3.9 mmol/L (ref 3.5–5.1)
Sodium: 143 mmol/L (ref 135–145)

## 2018-06-17 LAB — CBC
HEMATOCRIT: 39 % (ref 36.0–46.0)
HEMOGLOBIN: 12.1 g/dL (ref 12.0–15.0)
MCH: 29.9 pg (ref 26.0–34.0)
MCHC: 31 g/dL (ref 30.0–36.0)
MCV: 96.3 fL (ref 78.0–100.0)
Platelets: 287 10*3/uL (ref 150–400)
RBC: 4.05 MIL/uL (ref 3.87–5.11)
RDW: 14.5 % (ref 11.5–15.5)
WBC: 16 10*3/uL — ABNORMAL HIGH (ref 4.0–10.5)

## 2018-06-17 LAB — ECHOCARDIOGRAM COMPLETE: WEIGHTICAEL: 3502.67 [oz_av]

## 2018-06-17 LAB — MAGNESIUM: Magnesium: 2 mg/dL (ref 1.7–2.4)

## 2018-06-17 LAB — PHOSPHORUS: PHOSPHORUS: 3.6 mg/dL (ref 2.5–4.6)

## 2018-06-17 MED ORDER — OLANZAPINE 2.5 MG PO TABS
2.5000 mg | ORAL_TABLET | Freq: Every day | ORAL | Status: DC
  Administered 2018-06-17: 2.5 mg via ORAL
  Filled 2018-06-17: qty 1

## 2018-06-17 MED ORDER — FAMOTIDINE 20 MG PO TABS
20.0000 mg | ORAL_TABLET | Freq: Every day | ORAL | Status: DC
  Administered 2018-06-18: 20 mg via ORAL
  Filled 2018-06-17: qty 1

## 2018-06-17 NOTE — Consult Note (Addendum)
[facility] Consult   Reason for Consult:  Suicide attempt  Referring Physician:  Dr. Vaughan Browner  Patient Identification: Stacie Sanders MRN:  [id number] Principal Diagnosis: Suicide attempt [facility]) Diagnosis:   Patient Active Problem List   Diagnosis Date Noted  . Fatigue [R53.83] 03/01/2018  . Irritability [R45.4] 03/01/2018  . GAD (generalized anxiety disorder) [F41.1] 03/01/2018  . Gingivitis [K05.10] 03/01/2018  . Grinding of teeth [F45.8] 03/01/2018  . Healthcare maintenance [Z00.00] 02/15/2018  . Depression [F32.9] 02/15/2018  . Bipolar 1 disorder ([place]) [F31.9] 02/15/2018  . Cough in adult [R05] 02/15/2018  . Acute bacterial conjunctivitis of right eye [H10.31] 02/15/2018  . Acute respiratory failure ([place]) [J96.00] 01/03/2016  . Acute respiratory failure with hypoxemia ([place]) [J96.01]   . Acute encephalopathy [G93.40]   . Drug overdose [T50.901A] 12/29/2015  . Overdose [T50.901A] 12/29/2015    Total Time spent with patient: 1 hour  Subjective:   Stacie Sanders is a 47 y.o. female patient admitted with suicide attempt.  HPI:   Per chart review, patient attempted suicide by drug overdose of Buspar, Gabapentin, Paxil, Seroquel and Atarax in the setting of alcohol use. She called her family to notify them that she was going to end her life. EMS was called and she was found minimally responsive. She had a seizure on arrival and required Ativan with resolution. She was intubated for airway protection. Home medications include Wellbutrin 100 mg BID, Gabapentin 400 mg TID, Paxil 60 mg qhs and Seroquel 300 mg qhs. UDS was positive for benzodiazepines (given Ativan prior to UDS) and cocaine and BAL was 117 on admission.    On interview, Stacie Sanders reports separating from her husband 3 weeks ago.  She has been married for 21 years she reports, "It is as good as it gets" when asked about her mood.  She adamantly denies overdosing medications to end her life.  She reports  that she would say if she overdosed and admits to overdosing in 2016.  She is unable to explain what led to her hospitalization.  She reports that she was drinking and mixed her pills into 1 bottle although she is unable to explain why.  She becomes guarded and angry when asked further questions about her suicide attempt.  She reports that she just wants to go home and reports that no one should have to obtain collateral to determine if she tried to harm herself.  She reports fair sleep and appetite.  She reports intentionally losing 12 pounds in 3 weeks by doing a "lazy Atkins diet."  She denies current SI, HI or AVH.  She is followed at [facility] and was last seen 2 days ago for follow up.  Past Psychiatric History: BPAD and prior history of suicide attempt by overdose in 11/2015.   Risk to Self:  Yes given recent overdose. Risk to Others:  None. Denies HI.  Prior Inpatient Therapy:  Denies  Prior Outpatient Therapy:  Prior medications include Latuda (caused weight gain), Lithium, Lamictal, Paxil and Atarax.   Past Medical History:  Past Medical History:  Diagnosis Date  . Allergic rhinitis   . Bipolar 1 disorder ([place])   . Borderline diabetes mellitus   . Colon polyp   . Depression   . Dyspnea on exertion   . Esophageal stricture    scheduled for EGD and dilation 03-18-2016  . GAD (generalized anxiety disorder)   . History of acute respiratory failure    12-29-2015  due to polysubstance overdose--  pt  intubated --  resolved   . History of febrile seizure    x1  01/ 2017--  none since  . History of kidney stones    staghorn  . History of suicide attempt    12-29-2015  -- overdose klonopin, xanax, alcohol--  acute respirtory failure and acute encenphalopathy -- both resolved  . IBS (irritable bowel syndrome)   . Irregular menstrual cycle   . Renal calculi    bilateral    Past Surgical History:  Procedure Laterality Date  . COLONOSCOPY    . CYSTOSCOPY WITH STENT PLACEMENT Bilateral  03/10/2016   Procedure: CYSTOSCOPY WITH STENT PLACEMENT;  Surgeon: Cleon Gustin, MD;  Location: [facility];  Service: Urology;  Laterality: Bilateral;  . CYSTOSCOPY/RETROGRADE/URETEROSCOPY/STONE EXTRACTION WITH BASKET Bilateral 03/10/2016   Procedure: CYSTOSCOPY/RETROGRADE/URETEROSCOPY/STONE EXTRACTION WITH BASKET;  Surgeon: Cleon Gustin, MD;  Location: [facility];  Service: Urology;  Laterality: Bilateral;  . EXTRACORPOREAL SHOCK WAVE LITHOTRIPSY  x2    . PERCUTANEOUS NEPHROSTOLITHOTOMY Right 09-18-2005   staghorn  . TUBAL LIGATION  1999   Family History:  Family History  Problem Relation Age of Onset  . Cancer Father        pancreas  . Melanoma Father   . Cancer Maternal Grandmother        lung  . COPD Paternal Grandmother   . Melanoma Paternal Grandmother    Family Psychiatric  History: Father-schizophrenia Social History:  Social History   Substance and Sexual Activity  Alcohol Use Yes   Comment: occ     Social History   Substance and Sexual Activity  Drug Use No    Social History   Socioeconomic History  . Marital status: Married    Spouse name: Not on file  . Number of children: Not on file  . Years of education: Not on file  . Highest education level: Not on file  Occupational History  . Not on file  Social Needs  . Financial resource strain: Not on file  . Food insecurity:    Worry: Not on file    Inability: Not on file  . Transportation needs:    Medical: Not on file    Non-medical: Not on file  Tobacco Use  . Smoking status: Current Every Day Smoker    Packs/day: 0.50    Years: 20.00    Pack years: 10.00    Types: Cigarettes  . Smokeless tobacco: Never Used  Substance and Sexual Activity  . Alcohol use: Yes    Comment: occ  . Drug use: No  . Sexual activity: Yes    Birth control/protection: Surgical  Lifestyle  . Physical activity:    Days per week: 0 days    Minutes per session: 0 min  . Stress:  To some extent  Relationships  . Social connections:    Talks on phone: More than three times a week    Gets together: Never    Attends religious service: Never    Active member of club or organization: No    Attends meetings of clubs or organizations: Never    Relationship status: Married  Other Topics Concern  . Not on file  Social History Narrative  . Not on file   Additional Social History: She lives at home with her 3 adult children. She reports social alcohol use. She denies illicit substance use although UDS was positive for cocaine.     Allergies:   Allergies  Allergen Reactions  . Fentanyl  Hives and Itching  . Percocet [Oxycodone-Acetaminophen] Hives and Itching  . Robaxin [Methocarbamol] Rash    Labs:  Results for orders placed or performed during the hospital encounter of 06/14/18 (from the past 48 hour(s))  CK total and CKMB (cardiac)not at [facility]     Status: None   Collection Time: 06/15/18 11:11 AM  Result Value Ref Range   Total CK 53 38 - 234 U/L   CK, MB 1.1 0.5 - 5.0 ng/mL   Relative Index RELATIVE INDEX IS INVALID 0.0 - 2.5    Comment: WHEN CK < 100 U/L        Performed at [facility] [address] [place], [place] [address]   Magnesium     Status: None   Collection Time: 06/15/18 11:11 AM  Result Value Ref Range   Magnesium 2.3 1.7 - 2.4 mg/dL    Comment: Performed at [facility] [address] [place], [place] [address]  Phosphorus     Status: None   Collection Time: 06/15/18 11:11 AM  Result Value Ref Range   Phosphorus 4.1 2.5 - 4.6 mg/dL    Comment: Performed at [facility] [address] [place], [place] [address]  CBC with Differential/Platelet     Status: Abnormal   Collection Time: 06/15/18 11:11 AM  Result Value Ref Range   WBC 13.2 (H) 4.0 - 10.5 K/uL   RBC 3.84 (L) 3.87 - 5.11 MIL/uL   Hemoglobin 11.6 (L) 12.0 - 15.0 g/dL   HCT 36.6 36.0 - 46.0 %   MCV 95.3 78.0 - 100.0 fL   MCH 30.2 26.0 - 34.0 pg   MCHC  31.7 30.0 - 36.0 g/dL   RDW 14.8 11.5 - 15.5 %   Platelets 268 150 - 400 K/uL   Neutrophils Relative % 81 %   Neutro Abs 10.6 (H) 1.7 - 7.7 K/uL   Lymphocytes Relative 13 %   Lymphs Abs 1.7 0.7 - 4.0 K/uL   Monocytes Relative 5 %   Monocytes Absolute 0.7 0.1 - 1.0 K/uL   Eosinophils Relative 0 %   Eosinophils Absolute 0.1 0.0 - 0.7 K/uL   Basophils Relative 0 %   Basophils Absolute 0.0 0.0 - 0.1 K/uL   Immature Granulocytes 1 %   Abs Immature Granulocytes 0.1 0.0 - 0.1 K/uL    Comment: Performed at [facility] [address], [place], [place] [address]  Basic metabolic panel     Status: Abnormal   Collection Time: 06/15/18 11:11 AM  Result Value Ref Range   Sodium 147 (H) 135 - 145 mmol/L   Potassium 4.2 3.5 - 5.1 mmol/L   Chloride 113 (H) 101 - 111 mmol/L   CO2 26 22 - 32 mmol/L   Glucose, Bld 100 (H) 65 - 99 mg/dL   BUN 9 6 - 20 mg/dL   Creatinine, Ser 1.03 (H) 0.44 - 1.00 mg/dL   Calcium 9.1 8.9 - 10.3 mg/dL   GFR calc non Af Amer >60 >60 mL/min   GFR calc Af Amer >60 >60 mL/min    Comment: (NOTE) The eGFR has been calculated using the CKD EPI equation. This calculation has not been validated in all clinical situations. eGFR's persistently <60 mL/min signify possible Chronic Kidney Disease.    Anion gap 8 5 - 15    Comment: Performed at [facility] [address] [place], [place] [address]  Glucose, capillary     Status: Abnormal  Collection Time: 06/15/18 11:25 AM  Result Value Ref Range   Glucose-Capillary 106 (H) 65 - 99 mg/dL   Comment 1 Notify RN    Comment 2 Document in Chart   Glucose, capillary     Status: None   Collection Time: 06/15/18  3:43 PM  Result Value Ref Range   Glucose-Capillary 98 65 - 99 mg/dL   Comment 1 Notify RN    Comment 2 Document in Chart   Glucose, capillary     Status: None   Collection Time: 06/15/18  7:37 PM  Result Value Ref Range   Glucose-Capillary 96 65 - 99 mg/dL  Lactic acid, plasma     Status: None    Collection Time: 06/15/18 11:28 PM  Result Value Ref Range   Lactic Acid, Venous 1.2 0.5 - 1.9 mmol/L    Comment: Performed at [facility], [place] [address] [place], [place] [address]  Glucose, capillary     Status: Abnormal   Collection Time: 06/15/18 11:32 PM  Result Value Ref Range   Glucose-Capillary 103 (H) 65 - 99 mg/dL  Procalcitonin     Status: None   Collection Time: 06/16/18  2:25 AM  Result Value Ref Range   Procalcitonin <0.10 ng/mL    Comment:        Interpretation: PCT (Procalcitonin) <= 0.5 ng/mL: Systemic infection (sepsis) is not likely. Local bacterial infection is possible. (NOTE)       Sepsis PCT Algorithm           Lower Respiratory Tract                                      Infection PCT Algorithm    ----------------------------     ----------------------------         PCT < 0.25 ng/mL                PCT < 0.10 ng/mL         Strongly encourage             Strongly discourage   discontinuation of antibiotics    initiation of antibiotics    ----------------------------     -----------------------------       PCT 0.25 - 0.50 ng/mL            PCT 0.10 - 0.25 ng/mL               OR       >80% decrease in PCT            Discourage initiation of                                            antibiotics      Encourage discontinuation           of antibiotics    ----------------------------     -----------------------------         PCT >= 0.50 ng/mL              PCT 0.26 - 0.50 ng/mL               AND        <80% decrease in PCT             Encourage initiation of  antibiotics       Encourage continuation           of antibiotics    ----------------------------     -----------------------------        PCT >= 0.50 ng/mL                  PCT > 0.50 ng/mL               AND         increase in PCT                  Strongly encourage                                      initiation of antibiotics    Strongly encourage  escalation           of antibiotics                                     -----------------------------                                           PCT <= 0.25 ng/mL                                                 OR                                        > 80% decrease in PCT                                     Discontinue / Do not initiate                                             antibiotics Performed at [facility], [address], [place], [place] [address]   Glucose, capillary     Status: Abnormal   Collection Time: 06/16/18  3:15 AM  Result Value Ref Range   Glucose-Capillary 119 (H) 65 - 99 mg/dL  Glucose, capillary     Status: Abnormal   Collection Time: 06/16/18  7:39 AM  Result Value Ref Range   Glucose-Capillary 130 (H) 65 - 99 mg/dL  Glucose, capillary     Status: Abnormal   Collection Time: 06/16/18 11:23 AM  Result Value Ref Range   Glucose-Capillary 105 (H) 65 - 99 mg/dL  Glucose, capillary     Status: Abnormal   Collection Time: 06/16/18  3:30 PM  Result Value Ref Range   Glucose-Capillary 119 (H) 65 - 99 mg/dL  Glucose, capillary     Status: Abnormal   Collection Time: 06/16/18  8:21 PM  Result Value Ref Range   Glucose-Capillary 146 (H) 65 - 99 mg/dL   Comment 1  Notify RN    Comment 2 Document in Chart   Glucose, capillary     Status: Abnormal   Collection Time: 06/16/18 11:58 PM  Result Value Ref Range   Glucose-Capillary 153 (H) 65 - 99 mg/dL   Comment 1 Notify RN    Comment 2 Document in Chart   Glucose, capillary     Status: Abnormal   Collection Time: 06/17/18  3:32 AM  Result Value Ref Range   Glucose-Capillary 157 (H) 65 - 99 mg/dL   Comment 1 Notify RN    Comment 2 Document in Chart   CBC     Status: Abnormal   Collection Time: 06/17/18  3:41 AM  Result Value Ref Range   WBC 16.0 (H) 4.0 - 10.5 K/uL   RBC 4.05 3.87 - 5.11 MIL/uL   Hemoglobin 12.1 12.0 - 15.0 g/dL   HCT 39.0 36.0 - 46.0 %   MCV 96.3 78.0 - 100.0 fL   MCH 29.9 26.0 -  34.0 pg   MCHC 31.0 30.0 - 36.0 g/dL   RDW 14.5 11.5 - 15.5 %   Platelets 287 150 - 400 K/uL    Comment: Performed at [facility], [place]. [address] [place], [place] [address]  Basic metabolic panel     Status: Abnormal   Collection Time: 06/17/18  3:41 AM  Result Value Ref Range   Sodium 143 135 - 145 mmol/L   Potassium 3.9 3.5 - 5.1 mmol/L   Chloride 105 101 - 111 mmol/L   CO2 25 22 - 32 mmol/L   Glucose, Bld 158 (H) 65 - 99 mg/dL   BUN 12 6 - 20 mg/dL   Creatinine, Ser 0.92 0.44 - 1.00 mg/dL   Calcium 9.8 8.9 - 10.3 mg/dL   GFR calc non Af Amer >60 >60 mL/min   GFR calc Af Amer >60 >60 mL/min    Comment: (NOTE) The eGFR has been calculated using the CKD EPI equation. This calculation has not been validated in all clinical situations. eGFR's persistently <60 mL/min signify possible Chronic Kidney Disease.    Anion gap 13 5 - 15    Comment: Performed at [facility] [address] [place] [address] [place], [place] [address]  Magnesium     Status: None   Collection Time: 06/17/18  3:41 AM  Result Value Ref Range   Magnesium 2.0 1.7 - 2.4 mg/dL    Comment: Performed at [facility] [address] [place], [place] [address]  Phosphorus     Status: None   Collection Time: 06/17/18  3:41 AM  Result Value Ref Range   Phosphorus 3.6 2.5 - 4.6 mg/dL    Comment: Performed at [facility] [address] [place], [place] [address]  Glucose, capillary     Status: Abnormal   Collection Time: 06/17/18  7:48 AM  Result Value Ref Range   Glucose-Capillary 156 (H) 65 - 99 mg/dL   Comment 1 Notify RN    Comment 2 Document in Chart     Current Facility-Administered Medications  Medication Dose Route Frequency Provider Last Rate Last Dose  . 0.9 %  sodium chloride infusion  250 mL Intravenous PRN Hammonds, Sharyn Blitz, MD 10 mL/hr at 06/17/18 0801    . albuterol (PROVENTIL) (2.5 MG/3ML) 0.083% nebulizer solution 2.5 mg  2.5 mg Nebulization Q2H PRN Hammonds, Sharyn Blitz, MD       . docusate (COLACE) 50 MG/5ML liquid 100 mg  100 mg Per  Tube BID PRN Hammonds, Sharyn Blitz, MD      . famotidine (PEPCID) IVPB 20 mg premix  20 mg Intravenous Q24H Desai, Rahul P, PA-C   Stopped at 06/17/18 0846  . folic acid injection 1 mg  1 mg Intravenous Daily Hammonds, Sharyn Blitz, MD   1 mg at 06/17/18 0910  . heparin injection 5,000 Units  5,000 Units Subcutaneous Q8H Hammonds, Sharyn Blitz, MD   5,000 Units at 06/17/18 0521  . insulin aspart (novoLOG) injection 1-3 Units  1-3 Units Subcutaneous Q4H Hammonds, Sharyn Blitz, MD   2 Units at 06/17/18 0759  . piperacillin-tazobactam (ZOSYN) IVPB 3.375 g  3.375 g Intravenous Q8H Poindexter, Leann T, RPH 12.5 mL/hr at 06/17/18 0522 3.375 g at 06/17/18 0522  . thiamine (B-1) injection 100 mg  100 mg Intravenous Daily Hammonds, Sharyn Blitz, MD   100 mg at 06/17/18 0910    Musculoskeletal: Strength & Muscle Tone: within normal limits Gait & Station: UTA since patient sitting in bed. Patient leans: N/A  Psychiatric Specialty Exam: Physical Exam  Nursing note and vitals reviewed. Constitutional: She is oriented to person, place, and time. She appears well-developed and well-nourished.  HENT:  Head: Normocephalic and atraumatic.  Neck: Normal range of motion.  Respiratory: Effort normal.  Musculoskeletal: Normal range of motion.  Neurological: She is alert and oriented to person, place, and time.  Skin: No rash noted.  Psychiatric: Her speech is normal and behavior is normal. Thought content normal. Her affect is angry. Cognition and memory are normal. She expresses impulsivity.    Review of Systems  Constitutional: Negative for chills and fever.  Cardiovascular: Negative for chest pain.  Gastrointestinal: Negative for abdominal pain, constipation, diarrhea, nausea and vomiting.  Psychiatric/Behavioral: Positive for depression. Negative for suicidal ideas.  All other systems reviewed and are negative.   Blood pressure (!) 99/44, pulse 82,  temperature 98.3 F (36.8 C), temperature source Oral, resp. rate 18, weight 99.3 kg (218 lb 14.7 oz), SpO2 100 %.Body mass index is 36.43 kg/m.  General Appearance: Fairly Groomed, middle aged, Caucasian female, wearing a hospital gown and sitting on the edge of her bed. NAD.   Eye Contact:  Good  Speech:  Clear and Coherent and Normal Rate  Volume:  Normal  Mood:  "It's as good as it gets."  Affect:  Constricted  Thought Process:  Goal Directed, Linear and Descriptions of Associations: Intact  Orientation:  Full (Time, Place, and Person)  Thought Content:  Logical  Suicidal Thoughts:  No  Homicidal Thoughts:  No  Memory:  Immediate;   Good Recent;   Good Remote;   Good  Judgement:  Poor  Insight:  Poor  Psychomotor Activity:  Normal  Concentration:  Concentration: Good and Attention Span: Good  Recall:  Good  Fund of Knowledge:  Good  Language:  Good  Akathisia:  No  Handed:  Right  AIMS (if indicated):   N/A  Assets:  Communication Skills Housing Social Support  ADL's:  Intact  Cognition:  WNL  Sleep:   Fair   Assessment: Stacie Sanders is a 47 y.o. female who was admitted with suicide attempt by drug overdose. She denies suicide attempt although she was placed under involuntary commitment by her family for attempting suicide and actually informed her family prior to her attempt. She reports recently separating from her husband which is a clear stressor. She has a history of prior suicide attempt and continues to be a high risk of harm to  self given ongoing stressors. She warrants inpatient psychiatric hospitalization for stabilization and treatment.    Treatment Plan Summary: -Patient warrants inpatient psychiatric hospitalization given high risk of harm to self. -Continue bedside sitter.  -Continue to hold home medications given overdose.  -Patient is under IVC so she is unable to leave the hospital.  -Will sign off on patient at this time. Please consult psychiatry  again as needed.     Disposition: Recommend psychiatric Inpatient admission when medically cleared.  Faythe Dingwall, DO 06/17/2018 9:25 AM

## 2018-06-17 NOTE — Progress Notes (Addendum)
PULMONARY / CRITICAL CARE MEDICINE   Name: Stacie Sanders MRN: [id number] DOB: 03-24-1971    ADMISSION DATE:  06/14/2018 CONSULTATION DATE: 06/14/18  REFERRING MD: Dr Tegeler  CHIEF COMPLAINT: Drug Overdose, Respiratory Failure  HISTORY OF PRESENT ILLNESS:   47yoF with hx DM, Bipolar disorder, Allergic rhinitis, Anxiety, ADHD, who presents to the hospital following a drug overdose. Patient told her family she wanted to kill herself. The family called EMS who arrived in approx <15 minutes. EMS found patient lying on the couch minimally responsive. She was surrounded by bottles of buspar, neurontin, paxil, seroquel, and vistaril, as well as Etoh. She was moving on way to the ER and nasal trumpet was placed due to degree of somnolence. On arrival to the ER as ER staff was setting up for intubation, patient had a generalized tonic clonic seizure x 1-2 minutes that resolved following 2mg  Ativan IV. Patient then intubated (using etomidate and roc @ 1913) and PCCM consulted. On my exam patient unresponsive with fixed/dilated pupils, no corneals, no cough or gag, no response to sternal rub. She remains this way 2.5 hours following intubation; no additional sedation given. Patient's son is present in ER at bedside; he denies any illicit drug use. In addition to drugs stated above he says she is also taking Adderall.    SUBJECTIVE:  Extubated yesterday.  Had mild post extubation stridor that improved More awake today a.m. Wants to go home.  VITAL SIGNS: BP 124/80   Pulse 68   Temp 98.3 F (36.8 C) (Oral)   Resp 18   Wt 218 lb 14.7 oz (99.3 kg)   SpO2 100%   BMI 36.43 kg/m   HEMODYNAMICS:  VENTILATOR SETTINGS: Vent Mode: PSV;CPAP FiO2 (%):  [40 %] 40 % PEEP:  [5 cmH20] 5 cmH20 Pressure Support:  [5 cmH20] 5 cmH20  INTAKE / OUTPUT: I/O last 3 completed shifts: In: 1679.5 [I.V.:340.3; IV Piggyback:1339.3] Out: 2080 [Urine:2005; Emesis/NG output:75]  PHYSICAL EXAMINATION: Blood  pressure 124/80, pulse 68, temperature 98.3 F (36.8 C), temperature source Oral, resp. rate 18, weight 218 lb 14.7 oz (99.3 kg), SpO2 100 %. Gen:      No acute distress HEENT:  EOMI, sclera anicteric Neck:     No masses; no thyromegaly Lungs:    Clear to auscultation bilaterally; normal respiratory effort CV:         Regular rate and rhythm; no murmurs Abd:      + bowel sounds; soft, non-tender; no palpable masses, no distension Ext:    No edema; adequate peripheral perfusion Skin:      Warm and dry; no rash Neuro: alert and oriented x 3 Psych: normal mood and affect  LABS:  BMET Recent Labs  Lab 06/14/18 1903 06/14/18 1909 06/15/18 1111 06/17/18 0341  NA 146* 147* 147* 143  K 3.6 3.5 4.2 3.9  CL 109 107 113* 105  CO2 23  --  26 25  BUN 12 12 9 12   CREATININE 1.21* 1.30* 1.03* 0.92  GLUCOSE 139* 131* 100* 158*   Electrolytes Recent Labs  Lab 06/14/18 1903 06/14/18 2058 06/15/18 1111 06/17/18 0341  CALCIUM 9.9  --  9.1 9.8  MG  --  1.8 2.3 2.0  PHOS  --  4.6 4.1 3.6   CBC Recent Labs  Lab 06/14/18 1903 06/14/18 1909 06/15/18 1111 06/17/18 0341  WBC 14.2*  --  13.2* 16.0*  HGB 15.1* 16.0* 11.6* 12.1  HCT 45.3 47.0* 36.6 39.0  PLT 380  --  268 287   Coag's No results for input(s): APTT, INR in the last 168 hours.  Sepsis Markers Recent Labs  Lab 06/14/18 2050 06/14/18 2058 06/15/18 2328 06/16/18 0225  LATICACIDVEN 1.7  --  1.2  --   PROCALCITON  --  <0.10  --  <0.10    ABG Recent Labs  Lab 06/14/18 2003 06/14/18 2202 06/15/18 0301  PHART 7.181* 7.234* 7.382  PCO2ART 57.2* 45.6 40.1  PO2ART 254* 111* 109.0*   Liver Enzymes Recent Labs  Lab 06/14/18 1903  AST 33  ALT 37  ALKPHOS 132*  BILITOT 0.6  ALBUMIN 4.6   Cardiac Enzymes Recent Labs  Lab 06/14/18 2058  TROPONINI <0.03    Glucose Recent Labs  Lab 06/16/18 1123 06/16/18 1530 06/16/18 2021 06/16/18 2358 06/17/18 0332 06/17/18 0748  GLUCAP 105* 119* 146* 153* 157*  156*   Imaging Chest x-ray 06/17/2018- cardiomegaly, vascular congestion, diffuse interstitial prominence.  I have reviewed the images personally.  STUDIES:  CT Head/Neck w/o contrast (6/17): Normal head CT. Mild multilevel degenerative disc disease. No acute abnormality seen in the cervical spine. CTA Head / Neck (6/17): Patent circulation.  No peripheral occlusion, stenosis, aneurysm.  Upper lobe consolidations in the visualized apex of the lung. EEG (6/17): Mild diffuse slowing.  No seizures. MRI brain 6/18 > Normal  CULTURES: Blood cultures 6/17: negative MRSA PCR 6/17- negative  ANTIBIOTICS: Zosyn 6/17>>   SIGNIFICANT EVENTS: 6/17: presented to ER following drug overdose as a suicide attempt; tonic clonic sz in ER. Intubated. Now unresponsive with no neuro reflexes. Neuro concerned for brainstem infarct vs subclinical status epilepticus and [facility]  LINES/TUBES: PIV's ETT 6/17 >> 6/19 Foley catheter 6/17 >> OG tube 6/17 >> 6/19  DISCUSSION: 47yoF with hx DM, Bipolar disorder, Allergic rhinitis, Anxiety, ADHD, who presents to the hospital following a drug overdose, had witnessed seizure in ER.  ASSESSMENT / PLAN:  PULMONARY Acute hypoxic and hypercapneic respiratory failure Aspiration Stable post extubation. Receiving 24 hrs steroids for post extubation stridor Continue Zosyn for 7 days Follow CXR  CARDIOVASCULAR QT prolongation - from medication overdose > improved Chest x-ray today reviewed with cardiomegaly, mild edema Lasix 40 mg 1 dose. Order echocardiogram.  RENAL AKI - creatinine 1.30, up from baseline of 0.83; foley catheter in place. UA shows no UTI Follow urine output and Cr  GASTROINTESTINAL GI prophylaxis Pepcid  HEMATOLOGIC DVT prophylaxis Leukocytosis- likely from steroids, stress Monitor CBC SCDs/heparin  ENDOCRINE DM SSI coverage  NEUROLOGIC Drug overdose - multiple pills.  UDS positive for cocaine and benzo's.  Salicylate and APAP negative. Acute Encephalopathy - required intubation in ED Seizure AEDs per neuro Psychiatry consult. Sitter at bedside  FAMILY  - Updates: patient updated at bedside - Inter-disciplinary family meet or Palliative Care meeting due by: 06/20/18.  Chilton GreathousePraveen Shirlee Whitmire MD [facility] 06/17/2018, 8:08 AM

## 2018-06-17 NOTE — Progress Notes (Signed)
  Echocardiogram 2D Echocardiogram has been performed.  Stacie Sanders Stacie Sanders 06/17/2018, 3:39 PM

## 2018-06-17 NOTE — Progress Notes (Signed)
Patient arrived to the unit Alert and Oriented X 4. Denies pain. All questions and concerns addressed Bed in the lowest position. cll light within reach. Patient is on suicide precautions. She wants to leave and is requesting information regarding how her IVC papers can be lifted. Nurse did educated the patient on why she is IVC. She denies trying to hurt herself.  Nurse will continue to monitor.

## 2018-06-17 NOTE — Progress Notes (Signed)
Received report on suicidal patient, she had attempted to leave floor through exit that is blocked, new sitter "female" up dated and room search complete, patient is IVC orders that are active. Will continue to monitor.

## 2018-06-18 ENCOUNTER — Other Ambulatory Visit: Payer: Self-pay

## 2018-06-18 ENCOUNTER — Inpatient Hospital Stay (HOSPITAL_COMMUNITY)
Admission: AD | Admit: 2018-06-18 | Discharge: 2018-06-25 | DRG: 885 | Disposition: A | Discharge status: Home / Self Care | Payer: No Typology Code available for payment source | Attending: Psychiatry | Admitting: Psychiatry

## 2018-06-18 ENCOUNTER — Inpatient Hospital Stay (HOSPITAL_COMMUNITY): Payer: Self-pay

## 2018-06-18 ENCOUNTER — Encounter (HOSPITAL_COMMUNITY): Payer: Self-pay

## 2018-06-18 DIAGNOSIS — F411 Generalized anxiety disorder: Secondary | ICD-10-CM | POA: Diagnosis present

## 2018-06-18 DIAGNOSIS — F17210 Nicotine dependence, cigarettes, uncomplicated: Secondary | ICD-10-CM | POA: Diagnosis present

## 2018-06-18 DIAGNOSIS — Z888 Allergy status to other drugs, medicaments and biological substances status: Secondary | ICD-10-CM | POA: Diagnosis not present

## 2018-06-18 DIAGNOSIS — F319 Bipolar disorder, unspecified: Secondary | ICD-10-CM | POA: Diagnosis not present

## 2018-06-18 DIAGNOSIS — Z818 Family history of other mental and behavioral disorders: Secondary | ICD-10-CM | POA: Diagnosis not present

## 2018-06-18 DIAGNOSIS — T1491XA Suicide attempt, initial encounter: Secondary | ICD-10-CM

## 2018-06-18 DIAGNOSIS — R402363 Coma scale, best motor response, obeys commands, at hospital admission: Secondary | ICD-10-CM | POA: Diagnosis present

## 2018-06-18 DIAGNOSIS — Z885 Allergy status to narcotic agent status: Secondary | ICD-10-CM

## 2018-06-18 DIAGNOSIS — F4310 Post-traumatic stress disorder, unspecified: Secondary | ICD-10-CM | POA: Diagnosis present

## 2018-06-18 DIAGNOSIS — F10121 Alcohol abuse with intoxication delirium: Secondary | ICD-10-CM

## 2018-06-18 DIAGNOSIS — T43592A Poisoning by other antipsychotics and neuroleptics, intentional self-harm, initial encounter: Secondary | ICD-10-CM | POA: Diagnosis present

## 2018-06-18 DIAGNOSIS — R402143 Coma scale, eyes open, spontaneous, at hospital admission: Secondary | ICD-10-CM | POA: Diagnosis present

## 2018-06-18 DIAGNOSIS — K589 Irritable bowel syndrome without diarrhea: Secondary | ICD-10-CM | POA: Diagnosis present

## 2018-06-18 DIAGNOSIS — Z915 Personal history of self-harm: Secondary | ICD-10-CM | POA: Diagnosis not present

## 2018-06-18 DIAGNOSIS — T405X2A Poisoning by cocaine, intentional self-harm, initial encounter: Secondary | ICD-10-CM | POA: Diagnosis present

## 2018-06-18 DIAGNOSIS — R7303 Prediabetes: Secondary | ICD-10-CM | POA: Diagnosis present

## 2018-06-18 DIAGNOSIS — Z87442 Personal history of urinary calculi: Secondary | ICD-10-CM | POA: Diagnosis not present

## 2018-06-18 DIAGNOSIS — F1410 Cocaine abuse, uncomplicated: Secondary | ICD-10-CM | POA: Diagnosis not present

## 2018-06-18 DIAGNOSIS — J9600 Acute respiratory failure, unspecified whether with hypoxia or hypercapnia: Secondary | ICD-10-CM

## 2018-06-18 DIAGNOSIS — R402253 Coma scale, best verbal response, oriented, at hospital admission: Secondary | ICD-10-CM | POA: Diagnosis present

## 2018-06-18 DIAGNOSIS — T426X2A Poisoning by other antiepileptic and sedative-hypnotic drugs, intentional self-harm, initial encounter: Secondary | ICD-10-CM | POA: Diagnosis present

## 2018-06-18 DIAGNOSIS — J690 Pneumonitis due to inhalation of food and vomit: Secondary | ICD-10-CM

## 2018-06-18 DIAGNOSIS — R569 Unspecified convulsions: Secondary | ICD-10-CM | POA: Diagnosis present

## 2018-06-18 DIAGNOSIS — Z79899 Other long term (current) drug therapy: Secondary | ICD-10-CM

## 2018-06-18 DIAGNOSIS — N926 Irregular menstruation, unspecified: Secondary | ICD-10-CM | POA: Diagnosis present

## 2018-06-18 DIAGNOSIS — T43222A Poisoning by selective serotonin reuptake inhibitors, intentional self-harm, initial encounter: Secondary | ICD-10-CM | POA: Diagnosis present

## 2018-06-18 DIAGNOSIS — T510X2A Toxic effect of ethanol, intentional self-harm, initial encounter: Secondary | ICD-10-CM | POA: Diagnosis present

## 2018-06-18 DIAGNOSIS — F4010 Social phobia, unspecified: Secondary | ICD-10-CM | POA: Diagnosis present

## 2018-06-18 LAB — BASIC METABOLIC PANEL
Anion gap: 8 (ref 5–15)
BUN: 19 mg/dL (ref 6–20)
CO2: 26 mmol/L (ref 22–32)
CREATININE: 0.95 mg/dL (ref 0.44–1.00)
Calcium: 9.3 mg/dL (ref 8.9–10.3)
Chloride: 108 mmol/L (ref 101–111)
GFR calc non Af Amer: >60 mL/min (ref >60)
Glucose, Bld: 107 mg/dL — ABNORMAL HIGH (ref 65–99)
Potassium: 3.6 mmol/L (ref 3.5–5.1)
SODIUM: 142 mmol/L (ref 135–145)

## 2018-06-18 LAB — CBC
HCT: 35.9 % — ABNORMAL LOW (ref 36.0–46.0)
HEMOGLOBIN: 11.4 g/dL — AB (ref 12.0–15.0)
MCH: 30.3 pg (ref 26.0–34.0)
MCHC: 31.8 g/dL (ref 30.0–36.0)
MCV: 95.5 fL (ref 78.0–100.0)
Platelets: 305 10*3/uL (ref 150–400)
RBC: 3.76 MIL/uL — AB (ref 3.87–5.11)
RDW: 14.4 % (ref 11.5–15.5)
WBC: 17.1 10*3/uL — ABNORMAL HIGH (ref 4.0–10.5)

## 2018-06-18 LAB — MAGNESIUM: MAGNESIUM: 2.2 mg/dL (ref 1.7–2.4)

## 2018-06-18 LAB — PHOSPHORUS: PHOSPHORUS: 2.9 mg/dL (ref 2.5–4.6)

## 2018-06-18 MED ORDER — GABAPENTIN 400 MG PO CAPS
400.0000 mg | ORAL_CAPSULE | Freq: Three times a day (TID) | ORAL | Status: DC
  Administered 2018-06-18 – 2018-06-25 (×21): 400 mg via ORAL
  Filled 2018-06-18 (×28): qty 1

## 2018-06-18 MED ORDER — THIAMINE HCL 100 MG/ML IJ SOLN: 100.0000 mg | Freq: Every day | INTRAMUSCULAR | Status: DC

## 2018-06-18 MED ORDER — QUETIAPINE FUMARATE 300 MG PO TABS
300.0000 mg | ORAL_TABLET | Freq: Every day | ORAL | Status: DC
  Administered 2018-06-18 – 2018-06-20 (×3): 300 mg via ORAL
  Filled 2018-06-18 (×5): qty 1

## 2018-06-18 MED ORDER — PAROXETINE HCL 30 MG PO TABS
60.0000 mg | ORAL_TABLET | Freq: Every day | ORAL | Status: DC
  Administered 2018-06-18 – 2018-06-22 (×5): 60 mg via ORAL
  Filled 2018-06-18 (×6): qty 2
  Filled 2018-06-18: qty 6
  Filled 2018-06-18: qty 2

## 2018-06-18 MED ORDER — OLANZAPINE 2.5 MG PO TABS: 2.5000 mg | ORAL_TABLET | Freq: Every day | ORAL | Status: DC

## 2018-06-18 MED ORDER — THIAMINE HCL 100 MG/ML IJ SOLN
100.0000 mg | Freq: Every day | INTRAMUSCULAR | Status: DC
  Administered 2018-06-19: 100 mg via INTRAVENOUS
  Filled 2018-06-18: qty 2

## 2018-06-18 MED ORDER — OLANZAPINE 2.5 MG PO TABS
2.5000 mg | ORAL_TABLET | Freq: Every day | ORAL | Status: DC
  Administered 2018-06-18: 2.5 mg via ORAL
  Filled 2018-06-18 (×3): qty 1

## 2018-06-18 MED ORDER — GI COCKTAIL ~~LOC~~
30.0000 mL | Freq: Once | ORAL | Status: AC
  Administered 2018-06-18: 30 mL via ORAL
  Filled 2018-06-18: qty 30

## 2018-06-18 MED ORDER — BUPROPION HCL 100 MG PO TABS
100.0000 mg | ORAL_TABLET | Freq: Two times a day (BID) | ORAL | Status: DC
  Administered 2018-06-18 – 2018-06-24 (×13): 100 mg via ORAL
  Filled 2018-06-18 (×18): qty 1

## 2018-06-18 MED ORDER — ALBUTEROL SULFATE (2.5 MG/3ML) 0.083% IN NEBU: 2.5000 mg | INHALATION_SOLUTION | RESPIRATORY_TRACT | Status: DC | PRN

## 2018-06-18 MED ORDER — AMOXICILLIN-POT CLAVULANATE 875-125 MG PO TABS: 1.0000 | ORAL_TABLET | Freq: Two times a day (BID) | ORAL | Status: DC

## 2018-06-18 MED ORDER — ALBUTEROL SULFATE (2.5 MG/3ML) 0.083% IN NEBU: 2.5000 mg | INHALATION_SOLUTION | RESPIRATORY_TRACT | 12 refills | Status: DC | PRN

## 2018-06-18 MED ORDER — FAMOTIDINE 20 MG PO TABS: 20.0000 mg | ORAL_TABLET | Freq: Every day | ORAL | Status: DC

## 2018-06-18 MED ORDER — AMOXICILLIN-POT CLAVULANATE 875-125 MG PO TABS
1.0000 | ORAL_TABLET | Freq: Two times a day (BID) | ORAL | Status: AC
  Administered 2018-06-18 – 2018-06-22 (×8): 1 via ORAL
  Filled 2018-06-18 (×9): qty 1

## 2018-06-18 NOTE — Tx Team (Signed)
Initial Treatment Plan 06/18/2018 5:10 PM Stacie Sanders ZOX:[id number]    PATIENT STRESSORS: Financial difficulties Legal issue Marital or family conflict Medication change or noncompliance Substance abuse   PATIENT STRENGTHS: Motivation for treatment/growth Physical Health Supportive family/friends   PATIENT IDENTIFIED PROBLEMS: anxiety  Depression  'Be on my medications.'                 DISCHARGE CRITERIA:  Adequate post-discharge living arrangements Improved stabilization in mood, thinking, and/or behavior Medical problems require only outpatient monitoring Motivation to continue treatment in a less acute level of care  PRELIMINARY DISCHARGE PLAN: Attend aftercare/continuing care group Attend PHP/IOP Attend 12-step recovery group Outpatient therapy  PATIENT/FAMILY INVOLVEMENT: This treatment plan has been presented to and reviewed with the patient, Stacie Sanders, and/or family member. The patient and family have been given the opportunity to ask questions and make suggestions.  Stacie PunchesJane O Stacie Rawles, RN 06/18/2018, 5:10 PM

## 2018-06-18 NOTE — Progress Notes (Signed)
Paged attending they gave me verbal order for GI cocktail, as patient is having acid indigestion.

## 2018-06-18 NOTE — Progress Notes (Signed)
Gracy Racerracy Leigh Kite is a 47 y.o. female Involutarly [facility] for [facility] attempt  [facility] Pt stated she used to having her medications in small packets, but the doctor changed the package to one large package. Pt stated she got confused and took more than scheduled thorough out the day. Pt denied it was an attempt to kill herself. Pt also complained of being told at the Ed that he was just coming to see the doctor and be released the same day. She could not understand why they lied to her. Pt A & O x 4. Denied SI/HI AVH at the time of admission. Consents signed, skin/belongings search completed and pt oriented to unit. Pt stable at this time. Pt given the opportunity to express concerns and ask questions. Pt given toiletries. Will continue to monitor.

## 2018-06-18 NOTE — Progress Notes (Signed)
Patient discharging to behavioral health [facility]. All belongings sent with patient. Nurse call report and spoke with [name]

## 2018-06-18 NOTE — Progress Notes (Signed)
Patient is having a hard time sleeping, I will assess this again after I give her the GI cocktail.

## 2018-06-18 NOTE — Clinical Social Work Note (Signed)
Clinical Social Work Assessment  Patient Details  Name: Stacie Sanders MRN: [id number] Date of Birth: October 03, 1971  Date of referral:  06/18/18               Reason for consult:  Mental Health Concerns, Suicide Risk/Attempt                Permission sought to share information with:  Family Supports Permission granted to share information::  Yes, Verbal Permission Granted  Name::     Tanner  Agency::     Relationship::  Son  Contact Information:     Housing/Transportation Living arrangements for the past 2 months:  Single Family Home Source of Information:  Patient, Medical Team Patient Interpreter Needed:  None Criminal Activity/Legal Involvement Pertinent to Current Situation/Hospitalization:  No - Comment as needed Significant Relationships:  Adult Children, Spouse Lives with:  Self, Spouse Do you feel safe going back to the place where you live?  Yes Need for family participation in patient care:  No (Coment)  Care giving concerns:  Patient from home after a suicide attempt and [facility]   Social Worker assessment / plan:  [facility] CSW updated patient about transfer.  Employment status:  Disabled (Comment on whether or not currently receiving Disability) Insurance information:  [place] Recommend[place]:  Not assessed at this time Information / Referral to community resources:  Inpatient Psychiatric Care (Comment Required)  Patient/Family's Response to care:  Patient wants to go home, does not understand why she is IVC'd.  Patient/Family's Understanding of and Emotional Response to Diagnosis, Current Treatment, and Prognosis:  Patient says that this whole thing is a misunderstanding and that she was sorting out her new medications and somehow ended up with empty bottles. Patient wants to be able to go home, and is hopeful that [facility].  Emotional Assessment Appearance:   Appears stated age Attitude/Demeanor/Rapport:  Engaged Affect (typically observed):  Pleasant Orientation:  Oriented to Self, Oriented to Place, Oriented to  Time, Oriented to Situation Alcohol / Substance use:  Not Applicable Psych involvement (Current and /or in the community):  Yes (Comment), Outpatient Provider  Discharge Needs  Concerns to be addressed:  Care Coordination Readmission within the last 30 days:  No Current discharge risk:  Psychiatric Illness Barriers to Discharge:  No Barriers Identified   Baldemar Lenislizabeth M Yarenis Cerino, LCSW 06/18/2018, 2:09 PM

## 2018-06-18 NOTE — Care Management Note (Addendum)
Case Management Note  Patient Details  Name: Stacie Sanders MRN: [id number] Date of Birth: Dec 20, 1971  Subjective/Objective:     Pt admitted with suicide attempt. She is from home and IADL.               Action/Plan: Pt to d/c to psychiatric facility. CM signing off.   Expected Discharge Date:  (unknown)               Expected Discharge Plan:  Psychiatric Hospital  In-House Referral:  Clinical Social Work  Discharge planning Services     Post Acute Care Choice:    Choice offered to:     DME Arranged:    DME Agency:     HH Arranged:    HH Agency:     Status of Service:  Completed, signed off  If discussed at [employer] Meetings, dates discussed:    Additional Comments:  Kermit BaloKelli F Vladislav Axelson, RN 06/18/2018, 10:13 AM

## 2018-06-18 NOTE — Progress Notes (Signed)
PROGRESS NOTE        PATIENT DETAILS Name: Stacie Sanders Age: 46 y.o. Sex: female Date of Birth: 12-25-1971 Admit Date: 06/14/2018 Admitting Physician Gigi Gin, MD WJX:[id number], Jinny Blossom, NP  Brief Narrative: Patient is a 47 y.o. female with prior history of bipolar disorder, anxiety who presented to the hospital for a suicide attempt secondary to a drug overdose (BuSpar, Neurontin, Paxil, Seroquel, Vistaril as well as EtOH).  She was very groggy and drowsy when she was brought to the hospital, she subsequently had a seizure in the emergency room, she was intubated and admitted to the intensive care unit.  Subsequently stabilized-underwent neurology evaluation-extubated on 6/19, and transferred to the trial hospitalist service on 6/21.  See below for further details.  Subjective: She denies that she tried to commit suicide-she wants to go home-asking what happens if she does not agree for inpatient psychiatry transfer.  Assessment/Plan: Suicide attempt by drug overdose: She denies any suicide attempt-but per psychiatry note-she was placed under involuntary commitment by her family for attempting suicide, and actually informed her family prior to the suicide attempt.  Psychiatry recommends transfer to inpatient psychiatry-patient is under IVC so she is unable to leave the hospital-this was clearly explained to her by this MD.  Acute hypoxic and hypercapnic respiratory failure: Resolved.  Multifactorial-secondary to drug overdose, seizures and aspiration pneumonitis.  Intubated on 6/17 when she presented to the ED, extubated on 6/19.  Currently on room air.  Aspiration pneumonia: Continue Zosyn for a total of 7 days (day 5/7).  She is afebrile mild leukocytosis but otherwise nontoxic-appearing.  Blood culture negative.  Acute kidney injury: Resolved-likely hemodynamically mediated.  QT prolongation: QTC has been stable over the past few days.    Seizure:  Likely provoked-MRI brain/EEG negative-neurology does not recommend AEDs.  Polysubstance abuse: Urine drug screen positive for cocaine-continue counseling  Telemetry (independently reviewed): Sinus rhythm  DVT Prophylaxis: Prophylactic Heparin  Code Status: Full code   Family Communication: None at bedside  Disposition Plan: Remain inpatient-inpatient psychiatry when bed available  Antimicrobial agents: Anti-infectives (From admission, onward)   Start     Dose/Rate Route Frequency Ordered Stop   06/15/18 0300  piperacillin-tazobactam (ZOSYN) IVPB 3.375 g     3.375 g 12.5 mL/hr over 240 Minutes Intravenous Every 8 hours 06/14/18 2026     06/14/18 2030  piperacillin-tazobactam (ZOSYN) IVPB 3.375 g     3.375 g 100 mL/hr over 30 Minutes Intravenous STAT 06/14/18 2016 06/14/18 2103      Procedures: None  CONSULTS:  pulmonary/intensive care, neurology and psychiatry  Time spent: 25- minutes-Greater than 50% of this time was spent in counseling, explanation of diagnosis, planning of further management, and coordination of care.  MEDICATIONS: Scheduled Meds: . famotidine  20 mg Oral Daily  . folic acid  1 mg Intravenous Daily  . heparin injection (subcutaneous)  5,000 Units Subcutaneous Q8H  . OLANZapine  2.5 mg Oral QHS  . thiamine injection  100 mg Intravenous Daily   Continuous Infusions: . sodium chloride 10 mL/hr at 06/17/18 0801  . piperacillin-tazobactam (ZOSYN)  IV 3.375 g (06/18/18 0330)   PRN Meds:.sodium chloride, albuterol, docusate   PHYSICAL EXAM: Vital signs: Vitals:   06/17/18 1949 06/17/18 2100 06/18/18 0500 06/18/18 0721  BP: 114/64   98/69  Pulse: 77   71  Resp: 18  20  Temp: 98 F (36.7 C)   98.1 F (36.7 C)  TempSrc: Oral   Oral  SpO2: 96%   96%  Weight:   97.2 kg (214 lb 4.6 oz)   Height:  5\' 5"  (1.651 m)     Filed Weights   06/16/18 0500 06/17/18 0500 06/18/18 0500  Weight: 101.3 kg (223 lb 5.2 oz) 99.3 kg (218 lb 14.7 oz) 97.2 kg  (214 lb 4.6 oz)   Body mass index is 35.66 kg/m.   General appearance :Awake, alert, not in any distress. Speech Clear.  Eyes:, pupils equally reactive to light and accomodation,no scleral icterus.Pink conjunctiva HEENT: Atraumatic and Normocephalic Neck: supple, no JVD. No cervical lymphadenopathy. No thyromegaly Resp:Good air entry bilaterally, no added sounds  CVS: S1 S2 regular, no murmurs.  GI: Bowel sounds present, Non tender and not distended with no gaurding, rigidity or rebound.No organomegaly Extremities: B/L Lower Ext shows no edema, both legs are warm to touch Neurology:  speech clear,Non focal, sensation is grossly intact. Psychiatric: Normal judgment and insight. Alert and oriented x 3. Normal mood. Musculoskeletal:No digital cyanosis Skin:No Rash, warm and dry Wounds:N/A  I have personally reviewed following labs and imaging studies  LABORATORY DATA: CBC: Recent Labs  Lab 06/14/18 1903 06/14/18 1909 06/15/18 1111 06/17/18 0341 06/18/18 0302  WBC 14.2*  --  13.2* 16.0* 17.1*  NEUTROABS  --   --  10.6*  --   --   HGB 15.1* 16.0* 11.6* 12.1 11.4*  HCT 45.3 47.0* 36.6 39.0 35.9*  MCV 94.8  --  95.3 96.3 95.5  PLT 380  --  268 287 305    Basic Metabolic Panel: Recent Labs  Lab 06/14/18 1903 06/14/18 1909 06/14/18 2058 06/15/18 1111 06/17/18 0341 06/18/18 0302  NA 146* 147*  --  147* 143 142  K 3.6 3.5  --  4.2 3.9 3.6  CL 109 107  --  113* 105 108  CO2 23  --   --  26 25 26   GLUCOSE 139* 131*  --  100* 158* 107*  BUN 12 12  --  9 12 19   CREATININE 1.21* 1.30*  --  1.03* 0.92 0.95  CALCIUM 9.9  --   --  9.1 9.8 9.3  MG  --   --  1.8 2.3 2.0 2.2  PHOS  --   --  4.6 4.1 3.6 2.9    GFR: Estimated Creatinine Clearance: 86.3 mL/min (by C-G formula based on SCr of 0.95 mg/dL).  Liver Function Tests: Recent Labs  Lab 06/14/18 1903  AST 33  ALT 37  ALKPHOS 132*  BILITOT 0.6  PROT 8.8*  ALBUMIN 4.6   No results for input(s): LIPASE, AMYLASE in  the last 168 hours. No results for input(s): AMMONIA in the last 168 hours.  Coagulation Profile: No results for input(s): INR, PROTIME in the last 168 hours.  Cardiac Enzymes: Recent Labs  Lab 06/14/18 2058 06/15/18 1111  CKTOTAL 104 53  CKMB  --  1.1  TROPONINI <0.03  --     BNP (last 3 results) No results for input(s): PROBNP in the last 8760 hours.  HbA1C: No results for input(s): HGBA1C in the last 72 hours.  CBG: Recent Labs  Lab 06/16/18 1530 06/16/18 2021 06/16/18 2358 06/17/18 0332 06/17/18 0748  GLUCAP 119* 146* 153* 157* 156*    Lipid Profile: No results for input(s): CHOL, HDL, LDLCALC, TRIG, CHOLHDL, LDLDIRECT in the last 72 hours.  Thyroid Function Tests: No results  for input(s): TSH, T4TOTAL, FREET4, T3FREE, THYROIDAB in the last 72 hours.  Anemia Panel: No results for input(s): VITAMINB12, FOLATE, FERRITIN, TIBC, IRON, RETICCTPCT in the last 72 hours.  Urine analysis:    Component Value Date/Time   COLORURINE AMBER (A) 06/14/2018 2005   APPEARANCEUR CLOUDY (A) 06/14/2018 2005   LABSPEC 1.021 06/14/2018 2005   PHURINE 5.0 06/14/2018 2005   GLUCOSEU NEGATIVE 06/14/2018 2005   HGBUR NEGATIVE 06/14/2018 2005   BILIRUBINUR NEGATIVE 06/14/2018 2005   KETONESUR NEGATIVE 06/14/2018 2005   PROTEINUR 100 (A) 06/14/2018 2005   UROBILINOGEN 0.2 07/10/2015 2210   NITRITE NEGATIVE 06/14/2018 2005   LEUKOCYTESUR NEGATIVE 06/14/2018 2005    Sepsis Labs: Lactic Acid, Venous    Component Value Date/Time   LATICACIDVEN 1.2 06/15/2018 2328    MICROBIOLOGY: Recent Results (from the past 240 hour(s))  Culture, blood (routine x 2)     Status: None (Preliminary result)   Collection Time: 06/14/18  8:50 PM  Result Value Ref Range Status   Specimen Description   Final    BLOOD LEFT HAND Performed at [facility], [address] [place], [place] [address]    Special Requests   Final    BOTTLES DRAWN AEROBIC AND ANAEROBIC Blood  Culture adequate volume Performed at [facility], [address] [place], [place] [address]    Culture   Final    NO GROWTH 3 DAYS Performed at [facility], [address] [place], [place] [address]    Report Status PENDING  Incomplete  MRSA PCR Screening     Status: None   Collection Time: 06/14/18 11:36 PM  Result Value Ref Range Status   MRSA by PCR NEGATIVE NEGATIVE Final    Comment:        The GeneXpert MRSA Assay (FDA approved for NASAL specimens only), is one component of a comprehensive MRSA colonization surveillance program. It is not intended to diagnose MRSA infection nor to guide or monitor treatment for MRSA infections. Performed at [facility], [address] [place], [place] [address]     RADIOLOGY STUDIES/RESULTS: Ct Angio Head W Or Wo Contrast  Result Date: 06/14/2018 CLINICAL DATA:  47 y/o  F; concern for brainstem stroke. EXAM: CT ANGIOGRAPHY HEAD AND NECK TECHNIQUE: Multidetector CT imaging of the head and neck was performed using the standard protocol during bolus administration of intravenous contrast. Multiplanar CT image reconstructions and MIPs were obtained to evaluate the vascular anatomy. Carotid stenosis measurements (when applicable) are obtained utilizing NASCET criteria, using the distal internal carotid diameter as the denominator. CONTRAST:  80mL ISOVUE-370 IOPAMIDOL (ISOVUE-370) INJECTION 76% COMPARISON:  06/14/2018 CT head.  01/06/2016 MRI head. FINDINGS: CTA NECK FINDINGS Aortic arch: Standard branching. Imaged portion shows no evidence of aneurysm or dissection. No significant stenosis of the major arch vessel origins. Right carotid system: No evidence of dissection, stenosis (50% or greater) or occlusion. Left carotid system: No evidence of dissection, stenosis (50% or greater) or occlusion. Vertebral arteries: Codominant. No evidence of dissection, stenosis (50% or greater) or occlusion. Skeleton: Negative.  Other neck: Endotracheal tube tip 2.3 cm above the carina. Enteric tube tip extending below the field of view into the abdomen. Upper chest: Consolidation within the dependent upper lobes, partially visualized. Review of the MIP images confirms the above findings CTA HEAD FINDINGS Anterior circulation: No significant stenosis, proximal occlusion, aneurysm, or vascular malformation. Posterior circulation: No significant stenosis, proximal occlusion, aneurysm, or vascular malformation. Venous sinuses:  As permitted by contrast timing, patent. Anatomic variants: Complete circle-of-Willis. Delayed phase: No abnormal intracranial enhancement. Review of the MIP images confirms the above findings IMPRESSION: 1. Patent carotid and vertebral arteries. No dissection, aneurysm, or hemodynamically significant stenosis utilizing NASCET criteria. 2. Patent anterior and posterior intracranial circulation. No large vessel occlusion, aneurysm, or significant stenosis. 3. Partially visualized dependent consolidations within the upper lobes, suspected aspiration. These results were called by telephone at the time of interpretation on 06/14/2018 at 10:18 pm to Dr. Merlene Pulling, Who verbally acknowledged these results. Electronically Signed   By: Mitzi Hansen M.D.   On: 06/14/2018 22:21   Ct Head Wo Contrast  Result Date: 06/14/2018 CLINICAL DATA:  Altered level of consciousness. EXAM: CT HEAD WITHOUT CONTRAST CT CERVICAL SPINE WITHOUT CONTRAST TECHNIQUE: Multidetector CT imaging of the head and cervical spine was performed following the standard protocol without intravenous contrast. Multiplanar CT image reconstructions of the cervical spine were also generated. COMPARISON:  CT scan of January 02, 2016. FINDINGS: CT HEAD FINDINGS Brain: No evidence of acute infarction, hemorrhage, hydrocephalus, extra-axial collection or mass lesion/mass effect. Vascular: No hyperdense vessel or unexpected calcification. Skull: Normal.  Negative for fracture or focal lesion. Sinuses/Orbits: No acute finding. Other: None. CT CERVICAL SPINE FINDINGS Alignment: Normal. Skull base and vertebrae: No acute fracture. No primary bone lesion or focal pathologic process. Soft tissues and spinal canal: No prevertebral fluid or swelling. No visible canal hematoma. Disc levels: Mild degenerative disc disease is noted at C4-5 and C5-6 with anterior osteophyte formation. Upper chest: Negative. Other: None. IMPRESSION: Normal head CT. Mild multilevel degenerative disc disease. No acute abnormality seen in the cervical spine. Electronically Signed   By: Lupita Raider, M.D.   On: 06/14/2018 20:15   Ct Angio Neck W Or Wo Contrast  Result Date: 06/14/2018 CLINICAL DATA:  47 y/o  F; concern for brainstem stroke. EXAM: CT ANGIOGRAPHY HEAD AND NECK TECHNIQUE: Multidetector CT imaging of the head and neck was performed using the standard protocol during bolus administration of intravenous contrast. Multiplanar CT image reconstructions and MIPs were obtained to evaluate the vascular anatomy. Carotid stenosis measurements (when applicable) are obtained utilizing NASCET criteria, using the distal internal carotid diameter as the denominator. CONTRAST:  80mL ISOVUE-370 IOPAMIDOL (ISOVUE-370) INJECTION 76% COMPARISON:  06/14/2018 CT head.  01/06/2016 MRI head. FINDINGS: CTA NECK FINDINGS Aortic arch: Standard branching. Imaged portion shows no evidence of aneurysm or dissection. No significant stenosis of the major arch vessel origins. Right carotid system: No evidence of dissection, stenosis (50% or greater) or occlusion. Left carotid system: No evidence of dissection, stenosis (50% or greater) or occlusion. Vertebral arteries: Codominant. No evidence of dissection, stenosis (50% or greater) or occlusion. Skeleton: Negative. Other neck: Endotracheal tube tip 2.3 cm above the carina. Enteric tube tip extending below the field of view into the abdomen. Upper chest:  Consolidation within the dependent upper lobes, partially visualized. Review of the MIP images confirms the above findings CTA HEAD FINDINGS Anterior circulation: No significant stenosis, proximal occlusion, aneurysm, or vascular malformation. Posterior circulation: No significant stenosis, proximal occlusion, aneurysm, or vascular malformation. Venous sinuses: As permitted by contrast timing, patent. Anatomic variants: Complete circle-of-Willis. Delayed phase: No abnormal intracranial enhancement. Review of the MIP images confirms the above findings IMPRESSION: 1. Patent carotid and vertebral arteries. No dissection, aneurysm, or hemodynamically significant stenosis utilizing NASCET criteria. 2. Patent anterior and posterior intracranial circulation. No large vessel occlusion, aneurysm, or significant stenosis. 3. Partially visualized dependent consolidations within the upper  lobes, suspected aspiration. These results were called by telephone at the time of interpretation on 06/14/2018 at 10:18 pm to Dr. Merlene Pulling, Who verbally acknowledged these results. Electronically Signed   By: Mitzi Hansen M.D.   On: 06/14/2018 22:21   Ct Cervical Spine Wo Contrast  Result Date: 06/14/2018 CLINICAL DATA:  Altered level of consciousness. EXAM: CT HEAD WITHOUT CONTRAST CT CERVICAL SPINE WITHOUT CONTRAST TECHNIQUE: Multidetector CT imaging of the head and cervical spine was performed following the standard protocol without intravenous contrast. Multiplanar CT image reconstructions of the cervical spine were also generated. COMPARISON:  CT scan of January 02, 2016. FINDINGS: CT HEAD FINDINGS Brain: No evidence of acute infarction, hemorrhage, hydrocephalus, extra-axial collection or mass lesion/mass effect. Vascular: No hyperdense vessel or unexpected calcification. Skull: Normal. Negative for fracture or focal lesion. Sinuses/Orbits: No acute finding. Other: None. CT CERVICAL SPINE FINDINGS Alignment: Normal. Skull  base and vertebrae: No acute fracture. No primary bone lesion or focal pathologic process. Soft tissues and spinal canal: No prevertebral fluid or swelling. No visible canal hematoma. Disc levels: Mild degenerative disc disease is noted at C4-5 and C5-6 with anterior osteophyte formation. Upper chest: Negative. Other: None. IMPRESSION: Normal head CT. Mild multilevel degenerative disc disease. No acute abnormality seen in the cervical spine. Electronically Signed   By: Lupita Raider, M.D.   On: 06/14/2018 20:15   Mr Brain Wo Contrast  Result Date: 06/15/2018 CLINICAL DATA:  Coma.  Attempted suicide. EXAM: MRI HEAD WITHOUT CONTRAST TECHNIQUE: Multiplanar, multiecho pulse sequences of the brain and surrounding structures were obtained without intravenous contrast. COMPARISON:  Head CT and CTA from yesterday.  Brain MRI 01/06/2016 FINDINGS: Brain: No acute infarction, hemorrhage, hydrocephalus, extra-axial collection or mass lesion. No evidence of cortical swelling or elevated intracranial pressure. 2 or 3 FLAIR hyperintensities in the cerebral white matter, allowable for age. Vascular: Major flow voids are preserved. 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 Skull and upper cervical spine: Negative for marrow lesion Sinuses/Orbits: Negative orbits. Mild mucosal thickening in the paranasal sinuses. Small volume nasopharyngeal fluid in the setting of intubation. IMPRESSION: Unremarkable MRI of the brain. Electronically Signed   By: Marnee Spring M.D.   On: 06/15/2018 15:39   Dg Chest Port 1 View  Result Date: 06/18/2018 CLINICAL DATA:  Acute respiratory failure EXAM: PORTABLE CHEST 1 VIEW COMPARISON:  Yesterday FINDINGS: Generous heart size accentuated by low volumes. Interstitial and airspace opacity that are improved from yesterday. No Kerley lines, effusion, or pneumothorax. IMPRESSION: Improved aeration, especially at the left lower lobe. Lung volumes remain low  and the interstitium remains coarsened. Electronically Signed   By: Marnee Spring M.D.   On: 06/18/2018 07:19   Dg Chest Port 1 View  Result Date: 06/17/2018 CLINICAL DATA:  Acute respiratory failure EXAM: PORTABLE CHEST 1 VIEW COMPARISON:  06/15/2018 FINDINGS: Cardiomegaly with vascular congestion and diffuse interstitial prominence, likely interstitial edema. Left base atelectasis or infiltrate is similar prior study. No definite effusions or acute bony abnormality. IMPRESSION: Increasing interstitial prominence throughout the lungs, likely interstitial edema. Left base atelectasis or infiltrate, similar to prior study. Electronically Signed   By: Charlett Nose M.D.   On: 06/17/2018 08:32   Dg Chest Port 1 View  Result Date: 06/15/2018 CLINICAL DATA:  Respiratory failure. EXAM: PORTABLE CHEST 1 VIEW COMPARISON:  06/14/2018.  12/10/2017. FINDINGS: Endotracheal tube, NG tube in stable position. Heart size normal. Mild bibasilar subsegmental atelectasis. No pleural effusion or pneumothorax. IMPRESSION: 1.  Endotracheal  tube and NG tube stable position. 2.  Mild bibasilar subsegmental atelectasis. Electronically Signed   By: Maisie Fus  Register   On: 06/15/2018 06:58   Dg Chest Port 1 View  Result Date: 06/14/2018 CLINICAL DATA:  Encounter for ET tube placement. EXAM: PORTABLE CHEST 1 VIEW COMPARISON:  Chest radiograph 12/10/2017. FINDINGS: ET tube 2.7 cm above carina. The heart is enlarged. There are BILATERAL pulmonary opacities which could represent edema or infiltrates. Low lung volumes. No effusion or pneumothorax. Nasogastric tube tip appears to be within the stomach. IMPRESSION: ETT 2.7 cm above carina. BILATERAL pulmonary opacities could represent edema or infiltrates. Cardiomegaly. Electronically Signed   By: Elsie Stain M.D.   On: 06/14/2018 19:47   Dg Abd Portable 1 View  Result Date: 06/14/2018 CLINICAL DATA:  47 year old female NG tube placement. EXAM: PORTABLE ABDOMEN - 1 VIEW COMPARISON:   07/21/2016 KUB. Portable chest today reported separately. FINDINGS: Portable AP supine view at 1920 hours. Enteric tube courses to the left abdomen with side hole at the level of the gastric body. Nonobstructed visible bowel gas pattern. Abdominal visceral contours are within normal limits. No acute osseous abnormality identified. IMPRESSION: Enteric tube within the stomach, side hole at the level of the gastric body. Electronically Signed   By: Odessa Fleming M.D.   On: 06/14/2018 19:48     LOS: 4 days   Jeoffrey Massed, MD  [facility]  If 7PM-7AM, please contact night-coverage  Please page via [email or web address] [email or web address]-click on MD name and type text message  06/18/2018, 9:17 AM

## 2018-06-18 NOTE — Progress Notes (Signed)
Discharge to: [facility] Anticipated discharge date: 06/18/18 Transportation by: Sheriff  Report #: [phone], Room 305-2  Transport set for patient pick-up at 2:30 PM. Nurse to call report prior to patient being picked up.  CSW signing off.  Blenda Nicelylizabeth Kaia [place] LCSW [phone]

## 2018-06-18 NOTE — Discharge Summary (Signed)
PATIENT DETAILS Name: Stacie Sanders Age: 47 y.o. Sex: female Date of Birth: May 25, 1971 MRN: [id number]. Admitting Physician: Gigi Gin, MD WUJ:[id number], Jinny Blossom, NP  Admit Date: 06/14/2018 Discharge date: 06/18/2018  Recommendations for Outpatient Follow-up:  1. Follow up with PCP in 1-2 weeks 2. Please obtain BMP/CBC in one week  Admitted From:  Home  Disposition: [facility]   Home Health: No  Equipment/Devices: None  Discharge Condition: Stable  CODE STATUS: FULL CODE  Diet recommendation:  Regular  Brief Summary: See H&P, Labs, Consult and Test reports for all details in brief, Patient is a 47 y.o. female with prior history of bipolar disorder, anxiety who presented to the hospital for a suicide attempt secondary to a drug overdose (BuSpar, Neurontin, Paxil, Seroquel, Vistaril as well as EtOH).  She was very groggy and drowsy when she was brought to the hospital, she subsequently had a seizure in the emergency room, she was intubated and admitted to the intensive care unit.  Subsequently stabilized-underwent neurology evaluation-extubated on 6/19, and transferred to the trial hospitalist service on 6/21.  See below for further details.   Brief Hospital Course: Suicide attempt by drug overdose: She denies any suicide attempt-but per psychiatry note-she was placed under involuntary commitment by her family for attempting suicide, and actually informed her family prior to the suicide attempt.  Psychiatry recommends transfer to inpatient psychiatry  Acute hypoxic and hypercapnic respiratory failure: Resolved.  Multifactorial-secondary to drug overdose, seizures and aspiration pneumonitis.  Intubated on 6/17 when she presented to the ED, extubated on 6/19.  Currently on room air.  Aspiration pneumonia: Managed with Zosyn-we will transition to Augmentin on discharge. She is afebrile mild leukocytosis but otherwise nontoxic-appearing.  Blood culture  negative.  Acute kidney injury: Resolved-likely hemodynamically mediated.  QT prolongation: QTC has been stable over the past few days.    Seizure: Likely provoked-MRI brain/EEG negative-neurology does not recommend AEDs.  Polysubstance abuse: Urine drug screen positive for cocaine-continue counseling  Procedures/Studies: ETT 6/17 >> 6/19  Discharge Diagnoses:  Principal Problem:   Suicide attempt [facility]) Active Problems:   Acute encephalopathy   Discharge Instructions:  Activity:  As tolerated   Discharge Instructions    Diet general   Complete by:  As directed    Increase activity slowly   Complete by:  As directed      Allergies as of 06/18/2018      Reactions   Fentanyl Hives, Itching   Percocet [oxycodone-acetaminophen] Hives, Itching   Robaxin [methocarbamol] Rash      Medication List    STOP taking these medications   buPROPion 100 MG tablet Commonly known as:  WELLBUTRIN   gabapentin 400 MG capsule Commonly known as:  NEURONTIN   ibuprofen 600 MG tablet Commonly known as:  ADVIL,MOTRIN   omeprazole 20 MG tablet Commonly known as:  PRILOSEC OTC   PARoxetine 30 MG tablet Commonly known as:  PAXIL   QUEtiapine 300 MG tablet Commonly known as:  SEROQUEL   Vitamin D (Ergocalciferol) 50000 units Caps capsule Commonly known as:  DRISDOL     TAKE these medications   albuterol (2.5 MG/3ML) 0.083% nebulizer solution Commonly known as:  PROVENTIL Take 3 mLs (2.5 mg total) by nebulization every 2 (two) hours as needed for wheezing.   amoxicillin-clavulanate 875-125 MG tablet Commonly known as:  AUGMENTIN Take 1 tablet by mouth 2 (two) times daily. For 4 days from 6/21   famotidine 20 MG tablet Commonly known as:  PEPCID Take 1  tablet (20 mg total) by mouth daily. Start taking on:  06/19/2018   OLANZapine 2.5 MG tablet Commonly known as:  ZYPREXA Take 1 tablet (2.5 mg total) by mouth at bedtime.   thiamine 100 MG/ML injection Commonly  known as:  B-1 Inject 1 mL (100 mg total) into the vein daily. Start taking on:  06/19/2018      Follow-up Information    Dan[place], Jinny Blossom, NP. Schedule an appointment as soon as possible for a visit in 1 week(s).   Specialty:  Family Medicine Contact information: [address] [place] [address] [phone]          Allergies  Allergen Reactions  . Fentanyl Hives and Itching  . Percocet [Oxycodone-Acetaminophen] Hives and Itching  . Robaxin [Methocarbamol] Rash   Consultations:   pulmonary/intensive care and psychiatry  Other Procedures/Studies: Ct Angio Head W Or Wo Contrast  Result Date: 06/14/2018 CLINICAL DATA:  47 y/o  F; concern for brainstem stroke. EXAM: CT ANGIOGRAPHY HEAD AND NECK TECHNIQUE: Multidetector CT imaging of the head and neck was performed using the standard protocol during bolus administration of intravenous contrast. Multiplanar CT image reconstructions and MIPs were obtained to evaluate the vascular anatomy. Carotid stenosis measurements (when applicable) are obtained utilizing NASCET criteria, using the distal internal carotid diameter as the denominator. CONTRAST:  80mL ISOVUE-370 IOPAMIDOL (ISOVUE-370) INJECTION 76% COMPARISON:  06/14/2018 CT head.  01/06/2016 MRI head. FINDINGS: CTA NECK FINDINGS Aortic arch: Standard branching. Imaged portion shows no evidence of aneurysm or dissection. No significant stenosis of the major arch vessel origins. Right carotid system: No evidence of dissection, stenosis (50% or greater) or occlusion. Left carotid system: No evidence of dissection, stenosis (50% or greater) or occlusion. Vertebral arteries: Codominant. No evidence of dissection, stenosis (50% or greater) or occlusion. Skeleton: Negative. Other neck: Endotracheal tube tip 2.3 cm above the carina. Enteric tube tip extending below the field of view into the abdomen. Upper chest: Consolidation within the dependent upper lobes, partially visualized. Review of  the MIP images confirms the above findings CTA HEAD FINDINGS Anterior circulation: No significant stenosis, proximal occlusion, aneurysm, or vascular malformation. Posterior circulation: No significant stenosis, proximal occlusion, aneurysm, or vascular malformation. Venous sinuses: As permitted by contrast timing, patent. Anatomic variants: Complete circle-of-Willis. Delayed phase: No abnormal intracranial enhancement. Review of the MIP images confirms the above findings IMPRESSION: 1. Patent carotid and vertebral arteries. No dissection, aneurysm, or hemodynamically significant stenosis utilizing NASCET criteria. 2. Patent anterior and posterior intracranial circulation. No large vessel occlusion, aneurysm, or significant stenosis. 3. Partially visualized dependent consolidations within the upper lobes, suspected aspiration. These results were called by telephone at the time of interpretation on 06/14/2018 at 10:18 pm to Dr. Merlene Pulling, Who verbally acknowledged these results. Electronically Signed   By: Mitzi Hansen M.D.   On: 06/14/2018 22:21   Ct Head Wo Contrast  Result Date: 06/14/2018 CLINICAL DATA:  Altered level of consciousness. EXAM: CT HEAD WITHOUT CONTRAST CT CERVICAL SPINE WITHOUT CONTRAST TECHNIQUE: Multidetector CT imaging of the head and cervical spine was performed following the standard protocol without intravenous contrast. Multiplanar CT image reconstructions of the cervical spine were also generated. COMPARISON:  CT scan of January 02, 2016. FINDINGS: CT HEAD FINDINGS Brain: No evidence of acute infarction, hemorrhage, hydrocephalus, extra-axial collection or mass lesion/mass effect. Vascular: No hyperdense vessel or unexpected calcification. Skull: Normal. Negative for fracture or focal lesion. Sinuses/Orbits: No acute finding. Other: None. CT CERVICAL SPINE FINDINGS Alignment: Normal. Skull base and vertebrae:  No acute fracture. No primary bone lesion or focal pathologic process.  Soft tissues and spinal canal: No prevertebral fluid or swelling. No visible canal hematoma. Disc levels: Mild degenerative disc disease is noted at C4-5 and C5-6 with anterior osteophyte formation. Upper chest: Negative. Other: None. IMPRESSION: Normal head CT. Mild multilevel degenerative disc disease. No acute abnormality seen in the cervical spine. Electronically Signed   By: Lupita Raider, M.D.   On: 06/14/2018 20:15   Ct Angio Neck W Or Wo Contrast  Result Date: 06/14/2018 CLINICAL DATA:  47 y/o  F; concern for brainstem stroke. EXAM: CT ANGIOGRAPHY HEAD AND NECK TECHNIQUE: Multidetector CT imaging of the head and neck was performed using the standard protocol during bolus administration of intravenous contrast. Multiplanar CT image reconstructions and MIPs were obtained to evaluate the vascular anatomy. Carotid stenosis measurements (when applicable) are obtained utilizing NASCET criteria, using the distal internal carotid diameter as the denominator. CONTRAST:  80mL ISOVUE-370 IOPAMIDOL (ISOVUE-370) INJECTION 76% COMPARISON:  06/14/2018 CT head.  01/06/2016 MRI head. FINDINGS: CTA NECK FINDINGS Aortic arch: Standard branching. Imaged portion shows no evidence of aneurysm or dissection. No significant stenosis of the major arch vessel origins. Right carotid system: No evidence of dissection, stenosis (50% or greater) or occlusion. Left carotid system: No evidence of dissection, stenosis (50% or greater) or occlusion. Vertebral arteries: Codominant. No evidence of dissection, stenosis (50% or greater) or occlusion. Skeleton: Negative. Other neck: Endotracheal tube tip 2.3 cm above the carina. Enteric tube tip extending below the field of view into the abdomen. Upper chest: Consolidation within the dependent upper lobes, partially visualized. Review of the MIP images confirms the above findings CTA HEAD FINDINGS Anterior circulation: No significant stenosis, proximal occlusion, aneurysm, or vascular  malformation. Posterior circulation: No significant stenosis, proximal occlusion, aneurysm, or vascular malformation. Venous sinuses: As permitted by contrast timing, patent. Anatomic variants: Complete circle-of-Willis. Delayed phase: No abnormal intracranial enhancement. Review of the MIP images confirms the above findings IMPRESSION: 1. Patent carotid and vertebral arteries. No dissection, aneurysm, or hemodynamically significant stenosis utilizing NASCET criteria. 2. Patent anterior and posterior intracranial circulation. No large vessel occlusion, aneurysm, or significant stenosis. 3. Partially visualized dependent consolidations within the upper lobes, suspected aspiration. These results were called by telephone at the time of interpretation on 06/14/2018 at 10:18 pm to Dr. Merlene Pulling, Who verbally acknowledged these results. Electronically Signed   By: Mitzi Hansen M.D.   On: 06/14/2018 22:21   Ct Cervical Spine Wo Contrast  Result Date: 06/14/2018 CLINICAL DATA:  Altered level of consciousness. EXAM: CT HEAD WITHOUT CONTRAST CT CERVICAL SPINE WITHOUT CONTRAST TECHNIQUE: Multidetector CT imaging of the head and cervical spine was performed following the standard protocol without intravenous contrast. Multiplanar CT image reconstructions of the cervical spine were also generated. COMPARISON:  CT scan of January 02, 2016. FINDINGS: CT HEAD FINDINGS Brain: No evidence of acute infarction, hemorrhage, hydrocephalus, extra-axial collection or mass lesion/mass effect. Vascular: No hyperdense vessel or unexpected calcification. Skull: Normal. Negative for fracture or focal lesion. Sinuses/Orbits: No acute finding. Other: None. CT CERVICAL SPINE FINDINGS Alignment: Normal. Skull base and vertebrae: No acute fracture. No primary bone lesion or focal pathologic process. Soft tissues and spinal canal: No prevertebral fluid or swelling. No visible canal hematoma. Disc levels: Mild degenerative disc disease is  noted at C4-5 and C5-6 with anterior osteophyte formation. Upper chest: Negative. Other: None. IMPRESSION: Normal head CT. Mild multilevel degenerative disc disease. No acute abnormality seen in the cervical spine.  Electronically Signed   By: Lupita Raider, M.D.   On: 06/14/2018 20:15   Mr Brain Wo Contrast  Result Date: 06/15/2018 CLINICAL DATA:  Coma.  Attempted suicide. EXAM: MRI HEAD WITHOUT CONTRAST TECHNIQUE: Multiplanar, multiecho pulse sequences of the brain and surrounding structures were obtained without intravenous contrast. COMPARISON:  Head CT and CTA from yesterday.  Brain MRI 01/06/2016 FINDINGS: Brain: No acute infarction, hemorrhage, hydrocephalus, extra-axial collection or mass lesion. No evidence of cortical swelling or elevated intracranial pressure. 2 or 3 FLAIR hyperintensities in the cerebral white matter, allowable for age. Vascular: Major flow voids are preserved. 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 Skull and upper cervical spine: Negative for marrow lesion Sinuses/Orbits: Negative orbits. Mild mucosal thickening in the paranasal sinuses. Small volume nasopharyngeal fluid in the setting of intubation. IMPRESSION: Unremarkable MRI of the brain. Electronically Signed   By: Marnee Spring M.D.   On: 06/15/2018 15:39   Dg Chest Port 1 View  Result Date: 06/18/2018 CLINICAL DATA:  Acute respiratory failure EXAM: PORTABLE CHEST 1 VIEW COMPARISON:  Yesterday FINDINGS: Generous heart size accentuated by low volumes. Interstitial and airspace opacity that are improved from yesterday. No Kerley lines, effusion, or pneumothorax. IMPRESSION: Improved aeration, especially at the left lower lobe. Lung volumes remain low and the interstitium remains coarsened. Electronically Signed   By: Marnee Spring M.D.   On: 06/18/2018 07:19   Dg Chest Port 1 View  Result Date: 06/17/2018 CLINICAL DATA:  Acute respiratory failure EXAM: PORTABLE CHEST 1 VIEW  COMPARISON:  06/15/2018 FINDINGS: Cardiomegaly with vascular congestion and diffuse interstitial prominence, likely interstitial edema. Left base atelectasis or infiltrate is similar prior study. No definite effusions or acute bony abnormality. IMPRESSION: Increasing interstitial prominence throughout the lungs, likely interstitial edema. Left base atelectasis or infiltrate, similar to prior study. Electronically Signed   By: Charlett Nose M.D.   On: 06/17/2018 08:32   Dg Chest Port 1 View  Result Date: 06/15/2018 CLINICAL DATA:  Respiratory failure. EXAM: PORTABLE CHEST 1 VIEW COMPARISON:  06/14/2018.  12/10/2017. FINDINGS: Endotracheal tube, NG tube in stable position. Heart size normal. Mild bibasilar subsegmental atelectasis. No pleural effusion or pneumothorax. IMPRESSION: 1.  Endotracheal tube and NG tube stable position. 2.  Mild bibasilar subsegmental atelectasis. Electronically Signed   By: Maisie Fus  Register   On: 06/15/2018 06:58   Dg Chest Port 1 View  Result Date: 06/14/2018 CLINICAL DATA:  Encounter for ET tube placement. EXAM: PORTABLE CHEST 1 VIEW COMPARISON:  Chest radiograph 12/10/2017. FINDINGS: ET tube 2.7 cm above carina. The heart is enlarged. There are BILATERAL pulmonary opacities which could represent edema or infiltrates. Low lung volumes. No effusion or pneumothorax. Nasogastric tube tip appears to be within the stomach. IMPRESSION: ETT 2.7 cm above carina. BILATERAL pulmonary opacities could represent edema or infiltrates. Cardiomegaly. Electronically Signed   By: Elsie Stain M.D.   On: 06/14/2018 19:47   Dg Abd Portable 1 View  Result Date: 06/14/2018 CLINICAL DATA:  47 year old female NG tube placement. EXAM: PORTABLE ABDOMEN - 1 VIEW COMPARISON:  07/21/2016 KUB. Portable chest today reported separately. FINDINGS: Portable AP supine view at 1920 hours. Enteric tube courses to the left abdomen with side hole at the level of the gastric body. Nonobstructed visible bowel gas  pattern. Abdominal visceral contours are within normal limits. No acute osseous abnormality identified. IMPRESSION: Enteric tube within the stomach, side hole at the level of the gastric body. Electronically Signed   By: Rexene Edison  Margo Aye M.D.   On: 06/14/2018 19:48      TODAY-DAY OF DISCHARGE:  Subjective:   Stacie Sanders today has no headache,no chest abdominal pain,no new weakness tingling or numbness, feels much better wants to go home today.  She was actually requesting discharge home-she has been ambulating in the hallway without any major issues.  Objective:   Blood pressure (!) 96/59, pulse 73, temperature 97.6 F (36.4 C), temperature source Oral, resp. rate 20, height 5\' 5"  (1.651 m), weight 97.2 kg (214 lb 4.6 oz), SpO2 100 %. No intake or output data in the 24 hours ending 06/18/18 1300 Filed Weights   06/16/18 0500 06/17/18 0500 06/18/18 0500  Weight: 101.3 kg (223 lb 5.2 oz) 99.3 kg (218 lb 14.7 oz) 97.2 kg (214 lb 4.6 oz)    Exam: Awake Alert, Oriented *3, No new F.N deficits, Normal affect [place].AT,PERRAL Supple Neck,No JVD, No cervical lymphadenopathy appriciated.  Symmetrical Chest wall movement, Good air movement bilaterally, CTAB RRR,No Gallops,Rubs or new Murmurs, No Parasternal Heave +ve B.Sounds, Abd Soft, Non tender, No organomegaly appriciated, No rebound -guarding or rigidity. No Cyanosis, Clubbing or edema, No new Rash or bruise   PERTINENT RADIOLOGIC STUDIES: Ct Angio Head W Or Wo Contrast  Result Date: 06/14/2018 CLINICAL DATA:  47 y/o  F; concern for brainstem stroke. EXAM: CT ANGIOGRAPHY HEAD AND NECK TECHNIQUE: Multidetector CT imaging of the head and neck was performed using the standard protocol during bolus administration of intravenous contrast. Multiplanar CT image reconstructions and MIPs were obtained to evaluate the vascular anatomy. Carotid stenosis measurements (when applicable) are obtained utilizing NASCET criteria, using the distal internal carotid  diameter as the denominator. CONTRAST:  80mL ISOVUE-370 IOPAMIDOL (ISOVUE-370) INJECTION 76% COMPARISON:  06/14/2018 CT head.  01/06/2016 MRI head. FINDINGS: CTA NECK FINDINGS Aortic arch: Standard branching. Imaged portion shows no evidence of aneurysm or dissection. No significant stenosis of the major arch vessel origins. Right carotid system: No evidence of dissection, stenosis (50% or greater) or occlusion. Left carotid system: No evidence of dissection, stenosis (50% or greater) or occlusion. Vertebral arteries: Codominant. No evidence of dissection, stenosis (50% or greater) or occlusion. Skeleton: Negative. Other neck: Endotracheal tube tip 2.3 cm above the carina. Enteric tube tip extending below the field of view into the abdomen. Upper chest: Consolidation within the dependent upper lobes, partially visualized. Review of the MIP images confirms the above findings CTA HEAD FINDINGS Anterior circulation: No significant stenosis, proximal occlusion, aneurysm, or vascular malformation. Posterior circulation: No significant stenosis, proximal occlusion, aneurysm, or vascular malformation. Venous sinuses: As permitted by contrast timing, patent. Anatomic variants: Complete circle-of-Willis. Delayed phase: No abnormal intracranial enhancement. Review of the MIP images confirms the above findings IMPRESSION: 1. Patent carotid and vertebral arteries. No dissection, aneurysm, or hemodynamically significant stenosis utilizing NASCET criteria. 2. Patent anterior and posterior intracranial circulation. No large vessel occlusion, aneurysm, or significant stenosis. 3. Partially visualized dependent consolidations within the upper lobes, suspected aspiration. These results were called by telephone at the time of interpretation on 06/14/2018 at 10:18 pm to Dr. Merlene Pulling, Who verbally acknowledged these results. Electronically Signed   By: Mitzi Hansen M.D.   On: 06/14/2018 22:21   Ct Head Wo Contrast  Result  Date: 06/14/2018 CLINICAL DATA:  Altered level of consciousness. EXAM: CT HEAD WITHOUT CONTRAST CT CERVICAL SPINE WITHOUT CONTRAST TECHNIQUE: Multidetector CT imaging of the head and cervical spine was performed following the standard protocol without intravenous contrast. Multiplanar CT image reconstructions of the cervical spine  were also generated. COMPARISON:  CT scan of January 02, 2016. FINDINGS: CT HEAD FINDINGS Brain: No evidence of acute infarction, hemorrhage, hydrocephalus, extra-axial collection or mass lesion/mass effect. Vascular: No hyperdense vessel or unexpected calcification. Skull: Normal. Negative for fracture or focal lesion. Sinuses/Orbits: No acute finding. Other: None. CT CERVICAL SPINE FINDINGS Alignment: Normal. Skull base and vertebrae: No acute fracture. No primary bone lesion or focal pathologic process. Soft tissues and spinal canal: No prevertebral fluid or swelling. No visible canal hematoma. Disc levels: Mild degenerative disc disease is noted at C4-5 and C5-6 with anterior osteophyte formation. Upper chest: Negative. Other: None. IMPRESSION: Normal head CT. Mild multilevel degenerative disc disease. No acute abnormality seen in the cervical spine. Electronically Signed   By: Lupita Raider, M.D.   On: 06/14/2018 20:15   Ct Angio Neck W Or Wo Contrast  Result Date: 06/14/2018 CLINICAL DATA:  47 y/o  F; concern for brainstem stroke. EXAM: CT ANGIOGRAPHY HEAD AND NECK TECHNIQUE: Multidetector CT imaging of the head and neck was performed using the standard protocol during bolus administration of intravenous contrast. Multiplanar CT image reconstructions and MIPs were obtained to evaluate the vascular anatomy. Carotid stenosis measurements (when applicable) are obtained utilizing NASCET criteria, using the distal internal carotid diameter as the denominator. CONTRAST:  80mL ISOVUE-370 IOPAMIDOL (ISOVUE-370) INJECTION 76% COMPARISON:  06/14/2018 CT head.  01/06/2016 MRI head.  FINDINGS: CTA NECK FINDINGS Aortic arch: Standard branching. Imaged portion shows no evidence of aneurysm or dissection. No significant stenosis of the major arch vessel origins. Right carotid system: No evidence of dissection, stenosis (50% or greater) or occlusion. Left carotid system: No evidence of dissection, stenosis (50% or greater) or occlusion. Vertebral arteries: Codominant. No evidence of dissection, stenosis (50% or greater) or occlusion. Skeleton: Negative. Other neck: Endotracheal tube tip 2.3 cm above the carina. Enteric tube tip extending below the field of view into the abdomen. Upper chest: Consolidation within the dependent upper lobes, partially visualized. Review of the MIP images confirms the above findings CTA HEAD FINDINGS Anterior circulation: No significant stenosis, proximal occlusion, aneurysm, or vascular malformation. Posterior circulation: No significant stenosis, proximal occlusion, aneurysm, or vascular malformation. Venous sinuses: As permitted by contrast timing, patent. Anatomic variants: Complete circle-of-Willis. Delayed phase: No abnormal intracranial enhancement. Review of the MIP images confirms the above findings IMPRESSION: 1. Patent carotid and vertebral arteries. No dissection, aneurysm, or hemodynamically significant stenosis utilizing NASCET criteria. 2. Patent anterior and posterior intracranial circulation. No large vessel occlusion, aneurysm, or significant stenosis. 3. Partially visualized dependent consolidations within the upper lobes, suspected aspiration. These results were called by telephone at the time of interpretation on 06/14/2018 at 10:18 pm to Dr. Merlene Pulling, Who verbally acknowledged these results. Electronically Signed   By: Mitzi Hansen M.D.   On: 06/14/2018 22:21   Ct Cervical Spine Wo Contrast  Result Date: 06/14/2018 CLINICAL DATA:  Altered level of consciousness. EXAM: CT HEAD WITHOUT CONTRAST CT CERVICAL SPINE WITHOUT CONTRAST  TECHNIQUE: Multidetector CT imaging of the head and cervical spine was performed following the standard protocol without intravenous contrast. Multiplanar CT image reconstructions of the cervical spine were also generated. COMPARISON:  CT scan of January 02, 2016. FINDINGS: CT HEAD FINDINGS Brain: No evidence of acute infarction, hemorrhage, hydrocephalus, extra-axial collection or mass lesion/mass effect. Vascular: No hyperdense vessel or unexpected calcification. Skull: Normal. Negative for fracture or focal lesion. Sinuses/Orbits: No acute finding. Other: None. CT CERVICAL SPINE FINDINGS Alignment: Normal. Skull base and vertebrae: No acute fracture. No  primary bone lesion or focal pathologic process. Soft tissues and spinal canal: No prevertebral fluid or swelling. No visible canal hematoma. Disc levels: Mild degenerative disc disease is noted at C4-5 and C5-6 with anterior osteophyte formation. Upper chest: Negative. Other: None. IMPRESSION: Normal head CT. Mild multilevel degenerative disc disease. No acute abnormality seen in the cervical spine. Electronically Signed   By: Lupita RaiderJames  Green Jr, M.D.   On: 06/14/2018 20:15   Mr Brain Wo Contrast  Result Date: 06/15/2018 CLINICAL DATA:  Coma.  Attempted suicide. EXAM: MRI HEAD WITHOUT CONTRAST TECHNIQUE: Multiplanar, multiecho pulse sequences of the brain and surrounding structures were obtained without intravenous contrast. COMPARISON:  Head CT and CTA from yesterday.  Brain MRI 01/06/2016 FINDINGS: Brain: No acute infarction, hemorrhage, hydrocephalus, extra-axial collection or mass lesion. No evidence of cortical swelling or elevated intracranial pressure. 2 or 3 FLAIR hyperintensities in the cerebral white matter, allowable for age. Vascular: Major flow voids are preserved. 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 Skull and upper cervical spine: Negative for marrow lesion Sinuses/Orbits: Negative orbits. Mild mucosal  thickening in the paranasal sinuses. Small volume nasopharyngeal fluid in the setting of intubation. IMPRESSION: Unremarkable MRI of the brain. Electronically Signed   By: Marnee SpringJonathon  Watts M.D.   On: 06/15/2018 15:39   Dg Chest Port 1 View  Result Date: 06/18/2018 CLINICAL DATA:  Acute respiratory failure EXAM: PORTABLE CHEST 1 VIEW COMPARISON:  Yesterday FINDINGS: Generous heart size accentuated by low volumes. Interstitial and airspace opacity that are improved from yesterday. No Kerley lines, effusion, or pneumothorax. IMPRESSION: Improved aeration, especially at the left lower lobe. Lung volumes remain low and the interstitium remains coarsened. Electronically Signed   By: Marnee SpringJonathon  Watts M.D.   On: 06/18/2018 07:19   Dg Chest Port 1 View  Result Date: 06/17/2018 CLINICAL DATA:  Acute respiratory failure EXAM: PORTABLE CHEST 1 VIEW COMPARISON:  06/15/2018 FINDINGS: Cardiomegaly with vascular congestion and diffuse interstitial prominence, likely interstitial edema. Left base atelectasis or infiltrate is similar prior study. No definite effusions or acute bony abnormality. IMPRESSION: Increasing interstitial prominence throughout the lungs, likely interstitial edema. Left base atelectasis or infiltrate, similar to prior study. Electronically Signed   By: Charlett NoseKevin  Dover M.D.   On: 06/17/2018 08:32   Dg Chest Port 1 View  Result Date: 06/15/2018 CLINICAL DATA:  Respiratory failure. EXAM: PORTABLE CHEST 1 VIEW COMPARISON:  06/14/2018.  12/10/2017. FINDINGS: Endotracheal tube, NG tube in stable position. Heart size normal. Mild bibasilar subsegmental atelectasis. No pleural effusion or pneumothorax. IMPRESSION: 1.  Endotracheal tube and NG tube stable position. 2.  Mild bibasilar subsegmental atelectasis. Electronically Signed   By: Maisie Fushomas  Register   On: 06/15/2018 06:58   Dg Chest Port 1 View  Result Date: 06/14/2018 CLINICAL DATA:  Encounter for ET tube placement. EXAM: PORTABLE CHEST 1 VIEW  COMPARISON:  Chest radiograph 12/10/2017. FINDINGS: ET tube 2.7 cm above carina. The heart is enlarged. There are BILATERAL pulmonary opacities which could represent edema or infiltrates. Low lung volumes. No effusion or pneumothorax. Nasogastric tube tip appears to be within the stomach. IMPRESSION: ETT 2.7 cm above carina. BILATERAL pulmonary opacities could represent edema or infiltrates. Cardiomegaly. Electronically Signed   By: Elsie StainJohn T Curnes M.D.   On: 06/14/2018 19:47   Dg Abd Portable 1 View  Result Date: 06/14/2018 CLINICAL DATA:  47 year old female NG tube placement. EXAM: PORTABLE ABDOMEN - 1 VIEW COMPARISON:  07/21/2016 KUB. Portable chest today reported separately. FINDINGS: Portable AP supine view  at 1920 hours. Enteric tube courses to the left abdomen with side hole at the level of the gastric body. Nonobstructed visible bowel gas pattern. Abdominal visceral contours are within normal limits. No acute osseous abnormality identified. IMPRESSION: Enteric tube within the stomach, side hole at the level of the gastric body. Electronically Signed   By: Odessa Fleming M.D.   On: 06/14/2018 19:48     PERTINENT LAB RESULTS: CBC: Recent Labs    06/17/18 0341 06/18/18 0302  WBC 16.0* 17.1*  HGB 12.1 11.4*  HCT 39.0 35.9*  PLT 287 305   CMET CMP     Component Value Date/Time   NA 142 06/18/2018 0302   NA 140 03/01/2018 1039   K 3.6 06/18/2018 0302   CL 108 06/18/2018 0302   CO2 26 06/18/2018 0302   GLUCOSE 107 (H) 06/18/2018 0302   BUN 19 06/18/2018 0302   BUN 17 03/01/2018 1039   CREATININE 0.95 06/18/2018 0302   CALCIUM 9.3 06/18/2018 0302   PROT 8.8 (H) 06/14/2018 1903   PROT 7.3 03/01/2018 1039   ALBUMIN 4.6 06/14/2018 1903   ALBUMIN 4.9 03/01/2018 1039   AST 33 06/14/2018 1903   ALT 37 06/14/2018 1903   ALKPHOS 132 (H) 06/14/2018 1903   BILITOT 0.6 06/14/2018 1903   BILITOT 0.4 03/01/2018 1039   GFRNONAA >60 06/18/2018 0302   GFRAA >60 06/18/2018 0302    GFR Estimated  Creatinine Clearance: 86.3 mL/min (by C-G formula based on SCr of 0.95 mg/dL). No results for input(s): LIPASE, AMYLASE in the last 72 hours. No results for input(s): CKTOTAL, CKMB, CKMBINDEX, TROPONINI in the last 72 hours. Invalid input(s): POCBNP No results for input(s): DDIMER in the last 72 hours. No results for input(s): HGBA1C in the last 72 hours. No results for input(s): CHOL, HDL, LDLCALC, TRIG, CHOLHDL, LDLDIRECT in the last 72 hours. No results for input(s): TSH, T4TOTAL, T3FREE, THYROIDAB in the last 72 hours.  Invalid input(s): FREET3 No results for input(s): VITAMINB12, FOLATE, FERRITIN, TIBC, IRON, RETICCTPCT in the last 72 hours. Coags: No results for input(s): INR in the last 72 hours.  Invalid input(s): PT Microbiology: Recent Results (from the past 240 hour(s))  Culture, blood (routine x 2)     Status: None (Preliminary result)   Collection Time: 06/14/18  8:50 PM  Result Value Ref Range Status   Specimen Description   Final    BLOOD LEFT HAND Performed at [facility], [address], [place], [place] [address]    Special Requests   Final    BOTTLES DRAWN AEROBIC AND ANAEROBIC Blood Culture adequate volume Performed at [facility], [address] [place], [place] [address]    Culture   Final    NO GROWTH 4 DAYS Performed at [facility], [address] [place], [place] [address]    Report Status PENDING  Incomplete  MRSA PCR Screening     Status: None   Collection Time: 06/14/18 11:36 PM  Result Value Ref Range Status   MRSA by PCR NEGATIVE NEGATIVE Final    Comment:        The GeneXpert MRSA Assay (FDA approved for NASAL specimens only), is one component of a comprehensive MRSA colonization surveillance program. It is not intended to diagnose MRSA infection nor to guide or monitor treatment for MRSA infections. Performed at [facility], [address], [place], [place] [address]     FURTHER  DISCHARGE INSTRUCTIONS:  Get Medicines  reviewed and adjusted: Please take all your medications with you for your next visit with your Primary MD  Laboratory/radiological data: Please request your Primary MD to go over all hospital tests and procedure/radiological results at the follow up, please ask your Primary MD to get all Hospital records sent to his/her office.  In some cases, they will be blood work, cultures and biopsy results pending at the time of your discharge. Please request that your primary care M.D. goes through all the records of your hospital data and follows up on these results.  Also Note the following: If you experience worsening of your admission symptoms, develop shortness of breath, life threatening emergency, suicidal or homicidal thoughts you must seek medical attention immediately by calling 911 or calling your MD immediately  if symptoms less severe.  You must read complete instructions/literature along with all the possible adverse reactions/side effects for all the Medicines you take and that have been prescribed to you. Take any new Medicines after you have completely understood and accpet all the possible adverse reactions/side effects.   Do not drive when taking Pain medications or sleeping medications (Benzodaizepines)  Do not take more than prescribed Pain, Sleep and Anxiety Medications. It is not advisable to combine anxiety,sleep and pain medications without talking with your primary care practitioner  Special Instructions: If you have smoked or chewed Tobacco  in the last 2 yrs please stop smoking, stop any regular Alcohol  and or any Recreational drug use.  Wear Seat belts while driving.  Please note: You were cared for by a hospitalist during your hospital stay. Once you are discharged, your primary care physician will handle any further medical issues. Please note that NO REFILLS for any discharge medications will be authorized once you are discharged,  as it is imperative that you return to your primary care physician (or establish a relationship with a primary care physician if you do not have one) for your post hospital discharge needs so that they can reassess your need for medications and monitor your lab values.  Total Time spent coordinating discharge including counseling, education and face to face time equals 35 minutes.  Signed: Sheryn Aldaz 06/18/2018 1:00 PM

## 2018-06-19 DIAGNOSIS — F10121 Alcohol abuse with intoxication delirium: Secondary | ICD-10-CM

## 2018-06-19 DIAGNOSIS — F1410 Cocaine abuse, uncomplicated: Secondary | ICD-10-CM

## 2018-06-19 DIAGNOSIS — F319 Bipolar disorder, unspecified: Principal | ICD-10-CM

## 2018-06-19 LAB — CULTURE, BLOOD (ROUTINE X 2)
Culture: NO GROWTH
SPECIAL REQUESTS: ADEQUATE

## 2018-06-19 MED ORDER — IBUPROFEN 600 MG PO TABS
600.0000 mg | ORAL_TABLET | Freq: Four times a day (QID) | ORAL | Status: DC | PRN
  Administered 2018-06-19 – 2018-06-25 (×4): 600 mg via ORAL
  Filled 2018-06-19 (×3): qty 1

## 2018-06-19 MED ORDER — IBUPROFEN 600 MG PO TABS
ORAL_TABLET | ORAL | Status: AC
  Filled 2018-06-19: qty 1

## 2018-06-19 NOTE — Progress Notes (Signed)
D. Pt presents with an anxious affect congruent with mood. Pt mildly irritated this am complaining of room mate's snoring- requesting to be moved to a separate room. Pt remained in bed for much of the morning. Pt currently denies SI/HI and AV hallucinations.  A. Labs and vitals monitored. Pt compliant with medications. Pt supported emotionally and encouraged to express concerns and ask questions.   R. Pt remains safe with 15 minute checks. Will continue POC.

## 2018-06-19 NOTE — H&P (Signed)
Psychiatric Admission Assessment Adult  Patient Identification: Stacie Sanders MRN:  [id number] Date of Evaluation:  06/19/2018 Chief Complaint:  BIPOLAR 1 DISORDER Principal Diagnosis: Bipolar disorder ([place]) Diagnosis:   Patient Active Problem List   Diagnosis Date Noted  . Bipolar disorder ([place]) [F31.9] 06/18/2018  . Suicide attempt ([place]) [T14.91XA]   . Fatigue [R53.83] 03/01/2018  . Irritability [R45.4] 03/01/2018  . GAD (generalized anxiety disorder) [F41.1] 03/01/2018  . Gingivitis [K05.10] 03/01/2018  . Grinding of teeth [F45.8] 03/01/2018  . Healthcare maintenance [Z00.00] 02/15/2018  . Depression [F32.9] 02/15/2018  . Bipolar 1 disorder ([place]) [F31.9] 02/15/2018  . Cough in adult [R05] 02/15/2018  . Acute bacterial conjunctivitis of right eye [H10.31] 02/15/2018  . Acute respiratory failure ([place]) [J96.00] 01/03/2016  . Acute respiratory failure with hypoxemia ([place]) [J96.01]   . Acute encephalopathy [G93.40]   . Drug overdose [T50.901A] 12/29/2015  . Overdose [T50.901A] 12/29/2015   History of Present Illness:  06/14/18 ED Note: Pt arrived via EMS from home fnd sleeping on the couch. Pt told family that she was going to take pills to kill herself. On scene pt denied. Pt was able to ambulate to ambulance however upon arrival pt is not responsive to painful stimuli. Empty medication bottles of the following were fnd at scene Buspirone, Neurontin, Paxil, seroquel, Vistaril and Ethol ( beer bottles noted at scene) Pt was placed in C-collar by EMS to maintain [place], nasal trumpet in place ad pt placed on 4 lpm of oxygen.  06/14/18 ED provider Note: Stacie Sanders is a 47 y.o. female with a history of bipolar disorder, depression, and prior suicide attempt who presents with suicide attempt and intentional overdose.  According to EMS report to nursing, patient called family saying she was going to kill herself and then was found minimally responsive on the couch with empty pill  bottles.  Patient was initially walking and talking with EMS however during transport she had decreased mental status.  On my initial evaluation, patient was found to have a GCS of 3.  Patient had very noisy breathing with gurgling raising concern for aspiration.  On my exam, patient was unresponsive to painful stimuli.  Given the concern for airway protection, patient was intubated. Patient's lungs were coarse bilaterally and patient had a symmetric and sluggish pupils prior to intubation. CT was obtained given the pupil asymmetry and her altered mental status.  There was minimal concern for trauma as she was on a couch however cannot explain her exam based on the medications at this time. After intubation, critical care was called to admit the patient.  They will admit.  They involve neurology who requested further CT scans and likely EEG monitoring. Patient was transferred and admitted to the ICU at [facility] for further management.  06/18/18 [facility] RN Note: Stacie Sanders is a 47 y.o. female Involutarly admitted for suicide attempt  from [facility]. Pt stated she used to having her medications in small packets, but the doctor changed the package to one large package. Pt stated she got confused and took more than scheduled thorough out the day. Pt denied it was an attempt to kill herself. Pt also complained of being told at the Ed that he was just coming to see the doctor and be released the same day. She could not understand why they lied to her. Pt A & O x 4. Denied SI/HI AVH at the time of admission. Consents signed, skin/belongings search completed and pt oriented to unit. Pt stable  at this time.  On evaluation today: Patient is seen by me today face-to-face.  Patient begins in her interview by stating that she did not take any overdose in an attempt to kill herself and she was unsure of who was involuntarily committed her.  She denies any substance abuse.  She denies any chronic alcohol use, but admits  to occasional alcohol use when she goes out with her friends.  She continues repeating that he has she knows her husband has cheated on her and they are on again off again frequently.  The last time that she caught him cheating on her was 3 weeks ago and now she states that her entire world is turning upside down because he said he is done and he wants a divorce now.  She also reports that she deals with a lot of depression from her grandson dying in her house in 2016 and she was hospitalized then for an intentional overdose.  She denies any suicide attempt this time and states that it was accidental due to the new packaging of her medications.  She states that she sees Fred at [facility] and she has regular appointments.  However, patient is unclear about medications there are changes listed through the chart that do not add up.  Patient was also positive for benzodiazepines and cocaine.  Per report of the IVC, when EMS arrived at her house there was several pill bottles lying around as well as several empty beer bottles lying around.  Patient is very adamant about being discharged today and wants to go home and keeps using the excuse of her family as a reason that she needs to go.  However, and report is states that her family were the ones who called EMS when she reported threatening to kill herself.  Patient is very guarded about specifics and continues to deny any of the reported incidents as far as suicide attempt, overdose, substance abuse. I do suspect patient is lying to be discharged form the hospital, even with admitting to her numerous riss factors.    Associated Signs/Symptoms: Depression Symptoms:  depressed mood, anhedonia, feelings of worthlessness/guilt, difficulty concentrating, impaired memory, loss of energy/fatigue, weight loss, decreased appetite, (Hypo) Manic Symptoms:  Flight of Ideas, Community education officer, Irritable Mood, Anxiety Symptoms:  Excessive Worry, Psychotic  Symptoms:  Denies PTSD Symptoms: Had a traumatic exposure:  grandson died in her house a few years ago Total Time spent with patient: 45 minutes  Past Psychiatric History: Bipolar I, Goes to [place] regularly, Hospitalized in 2016 for overdose as a suicide attempt after grandson died.   Is the patient at risk to self? Yes.    Has the patient been a risk to self in the past 6 months? No.  Has the patient been a risk to self within the distant past? Yes.    Is the patient a risk to others? No.  Has the patient been a risk to others in the past 6 months? No.  Has the patient been a risk to others within the distant past? No.   Prior Inpatient Therapy:   Prior Outpatient Therapy:    Alcohol Screening: 1. How often do you have a drink containing alcohol?: Monthly or less 2. How many drinks containing alcohol do you have on a typical day when you are drinking?: 3 or 4 3. How often do you have six or more drinks on one occasion?: Never AUDIT-C Score: 2 4. How often during the last year have you found that  you were not able to stop drinking once you had started?: Never 5. How often during the last year have you failed to do what was normally expected from you becasue of drinking?: Never 6. How often during the last year have you needed a first drink in the morning to get yourself going after a heavy drinking session?: Never 7. How often during the last year have you had a feeling of guilt of remorse after drinking?: Less than monthly 8. How often during the last year have you been unable to remember what happened the night before because you had been drinking?: Never 9. Have you or someone else been injured as a result of your drinking?: No 10. Has a relative or friend or a doctor or another health worker been concerned about your drinking or suggested you cut down?: No Alcohol Use Disorder Identification Test Final Score (AUDIT): 3 Intervention/Follow-up: AUDIT Score <7 follow-up not  indicated Substance Abuse History in the last 12 months:  No. Consequences of Substance Abuse: NA Previous Psychotropic Medications: Yes  Psychological Evaluations: Yes  Past Medical History:  Past Medical History:  Diagnosis Date  . Allergic rhinitis   . Bipolar 1 disorder ([place])   . Borderline diabetes mellitus   . Colon polyp   . Depression   . Dyspnea on exertion   . Esophageal stricture    scheduled for EGD and dilation 03-18-2016  . GAD (generalized anxiety disorder)   . History of acute respiratory failure    12-29-2015  due to polysubstance overdose--  pt intubated --  resolved   . History of febrile seizure    x1  01/ 2017--  none since  . History of kidney stones    staghorn  . History of suicide attempt    12-29-2015  -- overdose klonopin, xanax, alcohol--  acute respirtory failure and acute encenphalopathy -- both resolved  . IBS (irritable bowel syndrome)   . Irregular menstrual cycle   . Renal calculi    bilateral    Past Surgical History:  Procedure Laterality Date  . COLONOSCOPY    . CYSTOSCOPY WITH STENT PLACEMENT Bilateral 03/10/2016   Procedure: CYSTOSCOPY WITH STENT PLACEMENT;  Surgeon: Cleon Gustin, MD;  Location: [facility];  Service: Urology;  Laterality: Bilateral;  . CYSTOSCOPY/RETROGRADE/URETEROSCOPY/STONE EXTRACTION WITH BASKET Bilateral 03/10/2016   Procedure: CYSTOSCOPY/RETROGRADE/URETEROSCOPY/STONE EXTRACTION WITH BASKET;  Surgeon: Cleon Gustin, MD;  Location: [facility];  Service: Urology;  Laterality: Bilateral;  . EXTRACORPOREAL SHOCK WAVE LITHOTRIPSY  x2    . PERCUTANEOUS NEPHROSTOLITHOTOMY Right 09-18-2005   staghorn  . TUBAL LIGATION  1999   Family History:  Family History  Problem Relation Age of Onset  . Cancer Father        pancreas  . Melanoma Father   . Cancer Maternal Grandmother        lung  . COPD Paternal Grandmother   . Melanoma Paternal Grandmother    Family Psychiatric   History: Dad - schizophrenia  Tobacco Screening: Have you used any form of tobacco in the last 30 days? (Cigarettes, Smokeless Tobacco, Cigars, and/or Pipes): Yes Tobacco use, Select all that apply: 5 or more cigarettes per day Are you interested in Tobacco Cessation Medications?: No, patient refused Counseled patient on smoking cessation including recognizing danger situations, developing coping skills and basic information about quitting provided: Refused/Declined practical counseling Social History:  Social History   Substance and Sexual Activity  Alcohol Use Yes   Comment: occ  Social History   Substance and Sexual Activity  Drug Use No    Additional Social History:      Pain Medications: see mar Prescriptions: see mar Over the Counter: see mar History of alcohol / drug use?: Yes Longest period of sobriety (when/how long): unkwon Negative Consequences of Use: Legal Withdrawal Symptoms: Other (Comment)(None)                    Allergies:   Allergies  Allergen Reactions  . Fentanyl Hives and Itching  . Percocet [Oxycodone-Acetaminophen] Hives and Itching  . Robaxin [Methocarbamol] Rash   Lab Results:  Results for orders placed or performed during the hospital encounter of 06/14/18 (from the past 48 hour(s))  CBC     Status: Abnormal   Collection Time: 06/18/18  3:02 AM  Result Value Ref Range   WBC 17.1 (H) 4.0 - 10.5 K/uL   RBC 3.76 (L) 3.87 - 5.11 MIL/uL   Hemoglobin 11.4 (L) 12.0 - 15.0 g/dL   HCT 35.9 (L) 36.0 - 46.0 %   MCV 95.5 78.0 - 100.0 fL   MCH 30.3 26.0 - 34.0 pg   MCHC 31.8 30.0 - 36.0 g/dL   RDW 14.4 11.5 - 15.5 %   Platelets 305 150 - 400 K/uL    Comment: Performed at [facility], [place] [address] [place], [place] [address]  Basic metabolic panel     Status: Abnormal   Collection Time: 06/18/18  3:02 AM  Result Value Ref Range   Sodium 142 135 - 145 mmol/L   Potassium 3.6 3.5 - 5.1 mmol/L   Chloride 108 101 - 111 mmol/L   CO2  26 22 - 32 mmol/L   Glucose, Bld 107 (H) 65 - 99 mg/dL   BUN 19 6 - 20 mg/dL   Creatinine, Ser 0.95 0.44 - 1.00 mg/dL   Calcium 9.3 8.9 - 10.3 mg/dL   GFR calc non Af Amer >60 >60 mL/min   GFR calc Af Amer >60 >60 mL/min    Comment: (NOTE) The eGFR has been calculated using the CKD EPI equation. This calculation has not been validated in all clinical situations. eGFR's persistently <60 mL/min signify possible Chronic Kidney Disease.    Anion gap 8 5 - 15    Comment: Performed at [facility] [address], [place], [place] [address]  Magnesium     Status: None   Collection Time: 06/18/18  3:02 AM  Result Value Ref Range   Magnesium 2.2 1.7 - 2.4 mg/dL    Comment: Performed at [facility] [address], [place], [place] [address]  Phosphorus     Status: None   Collection Time: 06/18/18  3:02 AM  Result Value Ref Range   Phosphorus 2.9 2.5 - 4.6 mg/dL    Comment: Performed at [facility] [address] [place], [place] [address]    Blood Alcohol level:  Lab Results  Component Value Date   ETH 12 (H) 06/14/2018   ETH 117 (H) 40/07/6760    Metabolic Disorder Labs:  Lab Results  Component Value Date   HGBA1C 5.5 03/01/2018   No results found for: PROLACTIN Lab Results  Component Value Date   CHOL 204 (H) 03/01/2018   TRIG 258 (H) 03/01/2018   HDL 62 03/01/2018   CHOLHDL 3.3 03/01/2018   LDLCALC 90 03/01/2018    Current Medications: Current Facility-Administered Medications  Medication Dose Route Frequency Provider Last Rate Last  Dose  . albuterol (PROVENTIL) (2.5 MG/3ML) 0.083% nebulizer solution 2.5 mg  2.5 mg Nebulization Q2H PRN Rankin, Shuvon B, NP      . amoxicillin-clavulanate (AUGMENTIN) 875-125 MG per tablet 1 tablet  1 tablet Oral BID Rankin, Shuvon B, NP   1 tablet at 06/19/18 0854  . buPROPion [facility]) tablet 100 mg  100 mg Oral BID Rankin, Shuvon B, NP   100 mg at 06/19/18 0854  . gabapentin (NEURONTIN) capsule 400 mg  400 mg  Oral TID Rankin, Shuvon B, NP   400 mg at 06/19/18 0854  . OLANZapine (ZYPREXA) tablet 2.5 mg  2.5 mg Oral QHS Rankin, Shuvon B, NP   2.5 mg at 06/18/18 2123  . PARoxetine (PAXIL) tablet 60 mg  60 mg Oral QHS Rankin, Shuvon B, NP   60 mg at 06/18/18 2123  . QUEtiapine (SEROQUEL) tablet 300 mg  300 mg Oral QHS Rankin, Shuvon B, NP   300 mg at 06/18/18 2123  . thiamine (B-1) injection 100 mg  100 mg Intravenous Daily Rankin, Shuvon B, NP   100 mg at 06/19/18 0856   PTA Medications: Medications Prior to Admission  Medication Sig Dispense Refill Last Dose  . albuterol (PROVENTIL) (2.5 MG/3ML) 0.083% nebulizer solution Take 3 mLs (2.5 mg total) by nebulization every 2 (two) hours as needed for wheezing. 75 mL 12   . amoxicillin-clavulanate (AUGMENTIN) 875-125 MG tablet Take 1 tablet by mouth 2 (two) times daily. For 4 days from 6/21     . famotidine (PEPCID) 20 MG tablet Take 1 tablet (20 mg total) by mouth daily.     Marland Kitchen OLANZapine (ZYPREXA) 2.5 MG tablet Take 1 tablet (2.5 mg total) by mouth at bedtime.     . thiamine (B-1) 100 MG/ML injection Inject 1 mL (100 mg total) into the vein daily. 25 mL      Musculoskeletal: Strength & Muscle Tone: within normal limits Gait & Station: normal Patient leans: N/A  Psychiatric Specialty Exam: Physical Exam  Nursing note and vitals reviewed. Constitutional: She is oriented to person, place, and time. She appears well-developed and well-nourished.  Cardiovascular: Normal rate.  Respiratory: Effort normal.  Musculoskeletal: Normal range of motion.  Neurological: She is alert and oriented to person, place, and time.  Skin: Skin is warm.    Review of Systems  Constitutional: Negative.   HENT: Negative.   Eyes: Negative.   Respiratory: Negative.   Cardiovascular: Negative.   Gastrointestinal: Negative.   Genitourinary: Negative.   Musculoskeletal: Negative.   Skin: Negative.   Neurological: Negative.   Endo/Heme/Allergies: Negative.    Psychiatric/Behavioral: Negative.     Blood pressure 106/75, pulse 81, temperature 98.4 F (36.9 C), temperature source Oral, resp. rate 20, height '5\' 5"'$  (1.651 m), weight 93.9 kg (207 lb), SpO2 100 %.Body mass index is 34.45 kg/m.  General Appearance: Casual  Eye Contact:  Good  Speech:  Clear and Coherent and Normal Rate  Volume:  Normal  Mood:  Euthymic  Affect:  Flat  Thought Process:  Linear and Descriptions of Associations: Intact  Orientation:  Full (Time, Place, and Person)  Thought Content:  Logical  Suicidal Thoughts:  No  Homicidal Thoughts:  No  Memory:  Immediate;   Good Recent;   Good Remote;   Good  Judgement:  Fair  Insight:  Fair  Psychomotor Activity:  Normal  Concentration:  Concentration: Good and Attention Span: Good  Recall:  Good  Fund of Knowledge:  Good  Language:  Good  Akathisia:  No  Handed:  Right  AIMS (if indicated):     Assets:  Communication Skills Housing Resilience  ADL's:  Intact  Cognition:  WNL  Sleep:  Number of Hours: 5.75    Treatment Plan Summary: Daily contact with patient to assess and evaluate symptoms and progress in treatment, Medication management and Plan is to:  -See MAR and SRA for medication management -Encourage group therapy participation  Observation Level/Precautions:  15 minute checks  Laboratory:  Reviewed  Psychotherapy:  Group therapy  Medications:  See [facility]  Consultations:  As needed  Discharge Concerns:  Compliance  Estimated LOS: 3-5 Days  Other:  Admit to [facility] for Primary Diagnosis: Bipolar disorder ([place]) Long Term Goal(s): Improvement in symptoms so as ready for discharge  Short Term Goals: Ability to identify changes in lifestyle to reduce recurrence of condition will improve, Ability to verbalize feelings will improve, Ability to disclose and discuss suicidal ideas and Ability to demonstrate self-control will improve  Physician Treatment Plan for Secondary  Diagnosis: Principal Problem:   Bipolar disorder ([place])  Long Term Goal(s): Improvement in symptoms so as ready for discharge  Short Term Goals: Ability to identify and develop effective coping behaviors will improve, Ability to maintain clinical measurements within normal limits will improve and Compliance with prescribed medications will improve  I certify that inpatient services furnished can reasonably be expected to improve the patient's condition.    Lewis Shock, FNP 6/22/20199:11 AM

## 2018-06-19 NOTE — Progress Notes (Signed)
Patient did not attend the evening speaker AA meeting. Pt was notified that group was beginning but remained in her room.   

## 2018-06-19 NOTE — BHH Suicide Risk Assessment (Signed)
[facility]   Nursing information obtained from:  Patient Demographic factors:  Unemployed, Caucasian Current Mental Status:  NA(denied) Loss Factors:  Legal issues, Loss of significant relationship Historical Factors:  Prior suicide attempts Risk Reduction Factors:  Living with another person, especially a relative, Positive therapeutic relationship  Total Time spent with patient: 1 hour Principal Problem: Bipolar disorder (HCC) Diagnosis:   Patient Active Problem List   Diagnosis Date Noted  . Bipolar disorder (HCC) [F31.9] 06/18/2018  . Suicide attempt (HCC) [T14.91XA]   . Fatigue [R53.83] 03/01/2018  . Irritability [R45.4] 03/01/2018  . GAD (generalized anxiety disorder) [F41.1] 03/01/2018  . Gingivitis [K05.10] 03/01/2018  . Grinding of teeth [F45.8] 03/01/2018  . Healthcare maintenance [Z00.00] 02/15/2018  . Depression [F32.9] 02/15/2018  . Bipolar 1 disorder (HCC) [F31.9] 02/15/2018  . Cough in adult [R05] 02/15/2018  . Acute bacterial conjunctivitis of right eye [H10.31] 02/15/2018  . Acute respiratory failure (HCC) [J96.00] 01/03/2016  . Acute respiratory failure with hypoxemia (HCC) [J96.01]   . Acute encephalopathy [G93.40]   . Drug overdose [T50.901A] 12/29/2015  . Overdose [T50.901A] 12/29/2015   Subjective Data: Patient is seen and examined.  Patient is a 47 year old female with a reported past psychiatric history significant for bipolar disorder who [facility] after an intentional overdose of BuSpar, gabapentin, Paxil, Seroquel, Atarax, alcohol and cocaine.  The patient stated that she is been married for 23 years, and that her husband told her on the day prior to the overdose that he was leaving her.  This was completely unexpected.  She stated that she mix the alcohol with the pills, and this was not a suicide attempt.  She also stated that her friends had come over the night after she had found out from her  husband, and they had cocaine, and she used that for the first time.  Her drug screen was also positive for benzodiazepines, but she stated she was not taking any of those.  After she overdosed she notified her family who called EMS.  She was transported to the hospital.  And apparently had a seizure on arrival.  She required Ativan at that time, and was intubated for airway protection.  She was stabilized, extubated, and transferred to our facility.  She stated that she has had the diagnosis of bipolar disorder for many years.  [facility].  She stated that she has previously been on Wellbutrin, gabapentin, Paxil and Seroquel and by her report has been stable.  She reported episodes in the past of euphoria, increased goal-directed activity, and the lack of sleep.  She denies current suicidal ideation, and is quite upset over the fact that she is not being discharged today.  She was admitted to the hospital for evaluation and stabilization.  She denied any continuing problem with alcohol.  She stated the last time she had used alcohol was on New Year's Eve.  Again, she stated that she did not use cocaine in the first time she ever used it was the evening prior to admission.  Continued Clinical Symptoms:  Alcohol Use Disorder Identification Test Final Score (AUDIT): 3 The "Alcohol Use Disorders Identification Test", Guidelines for Use in Primary Care, Second Edition.  World Science writerHealth Organization [facility](WHO). Score between 0-7:  no or low risk or alcohol related problems. Score between 8-15:  moderate risk of alcohol related problems. Score between 16-19:  high risk of alcohol related problems. Score 20 or above:  warrants further  diagnostic evaluation for alcohol dependence and treatment.   CLINICAL FACTORS:   Bipolar Disorder:   Depressive phase   Musculoskeletal: Strength & Muscle Tone: within normal limits Gait & Station: normal Patient leans: N/A  Psychiatric Specialty Exam: Physical  Exam  Nursing note and vitals reviewed. Constitutional: She is oriented to person, place, and time. She appears well-developed and well-nourished.  HENT:  Head: Normocephalic and atraumatic.  Respiratory: Effort normal.  Neurological: She is oriented to person, place, and time.    ROS  Blood pressure 106/75, pulse 81, temperature 98.4 F (36.9 C), temperature source Oral, resp. rate 20, height 5\' 5"  (1.651 m), weight 93.9 kg (207 lb), SpO2 100 %.Body mass index is 34.45 kg/m.  General Appearance: Disheveled  Eye Contact:  Fair  Speech:  Normal Rate  Volume:  Decreased  Mood:  Anxious, Dysphoric and Irritable  Affect:  Congruent  Thought Process:  Coherent  Orientation:  Full (Time, Place, and Person)  Thought Content:  Logical  Suicidal Thoughts:  Yes.  with intent/plan  Homicidal Thoughts:  No  Memory:  Immediate;   Fair Recent;   Fair Remote;   Fair  Judgement:  Impaired  Insight:  Lacking  Psychomotor Activity:  Increased  Concentration:  Concentration: Fair and Attention Span: Fair  Recall:  [employer] of Knowledge:  Fair  Language:  Fair  Akathisia:  Negative  Handed:  Right  AIMS (if indicated):     Assets:  Physical Health Resilience Social Support  ADL's:  Intact  Cognition:  WNL  Sleep:  Number of Hours: 5.75      COGNITIVE FEATURES THAT CONTRIBUTE TO RISK:  Thought constriction (tunnel vision)    SUICIDE RISK:   Moderate:  Frequent suicidal ideation with limited intensity, and duration, some specificity in terms of plans, no associated intent, good self-control, limited dysphoria/symptomatology, some risk factors present, and identifiable protective factors, including available and accessible social support.  PLAN OF CARE: Patient is seen and examined.  Patient is a 47 year old female with past psychiatric history significant for bipolar disorder with recent intentional overdose in a suicide attempt.  The patient denied that this was a suicide attempt,  but she had a seizure in the emergency department, and required intubation.  I talked to her about the seriousness of the event.  This occurred after her husband and told her that he was leaving her.  She also did cocaine on that evening.  She is requesting discharge, but I have tried to explain to her the seriousness of this attempt.  She stated that prior to the event that her medications for her bipolar disorder were stable.  We need to get collateral information from family as well as her psychiatrist [facility].  She will unfortunately have to be placed under involuntary commitment.  We will continue her Seroquel, Paxil, Wellbutrin and gabapentin at their current dosages.  I would say if anything needed to be changed it would be most likely her Paxil.  I will hold off on that until we get collateral information from her outpatient psychiatrist.  She received Zyprexa as well in the medical side of the hospital, and I am in a stop that for now so were not mixing antipsychotics.  She is also on antibiotics for probable aspiration.  We will continue those for now.  She will be integrated into the milieu.  She will be encouraged to attend groups.  She will see social work both individually and in groups.  She will be urged to work on her coping skills.  We will get collateral information.  She will be placed on 15-minute checks for safety as well as withdrawal syndromes.  I certify that inpatient services furnished can reasonably be expected to improve the patient's condition.   Antonieta Pert, MD 06/19/2018, 10:36 AM
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
## 2018-06-19 NOTE — Progress Notes (Signed)
D:  Stacie Sanders was up and visible on the unit.  She did not attend evening AA group.  She was very agitated about [facility] being [facility].  Phone was located via tech on the unit and [facility]  She was also very agitated about not being able to leave.  "I have a lot to take care of."  She denied SI/H or A/V hallucinations.  It was reported by her friend, that was visiting, that she made statements to her about if she wasn't able to get out of here tonight, she would just have to go to jail.  Stacie Sanders did not talk about wanting to harm herself or anyone else.  She was very focused on wanting to find out information about 50-B's and how to deal with separation/divorse.  Non emergency number to [employer] and phone number for Legal Aide given.  She is very worried about her house, her children and where she is going to go when she is discharged.   A:  1:1 with RN for support and encouragement.  Medications as ordered.  Q 15 minute checks maintained for safety.  Encouraged participation in group and unit activities.   R:  Stacie Sanders remains safe on the unit.  We will continue to monitor the progress towards her goals.

## 2018-06-19 NOTE — Progress Notes (Signed)
Pt did not attend AA meeting. Pt stayed in room and wandered the hall. Pt stated she was not ready to hear stories or share hers.

## 2018-06-19 NOTE — BHH Group Notes (Signed)
LCSW Group Therapy Note  06/19/2018   10:00--11:00am   Type of Therapy and Topic:  Group Therapy: Anger Cues and Responses  Participation Level:  Did Not Attend   Description of Group:   In this group, patients learned how to recognize the physical, cognitive, emotional, and behavioral responses they have to anger-provoking situations.  They identified a recent time they became angry and how they reacted.  They analyzed how their reaction was possibly beneficial and how it was possibly unhelpful.  The group discussed a variety of healthier coping skills that could help with such a situation in the future.  Deep breathing was practiced briefly.  Therapeutic Goals: 1. Patients will remember their last incident of anger and how they felt emotionally and physically, what their thoughts were at the time, and how they behaved. 2. Patients will identify how their behavior at that time worked for them, as well as how it worked against them. 3. Patients will explore possible new behaviors to use in future anger situations. 4. Patients will learn that anger itself is normal and cannot be eliminated, and that healthier reactions can assist with resolving conflict rather than worsening situations.  Summary of Patient Progress:  N/A - Patient was invited several times to attend group and encouraged to do so after her Psychosocial Assessment, but she was focused on leaving and declined.  Therapeutic Modalities:   Cognitive Behavioral Therapy  Lynnell ChadMareida J Grossman-Orr

## 2018-06-20 MED ORDER — ALUM & MAG HYDROXIDE-SIMETH 200-200-20 MG/5ML PO SUSP
15.0000 mL | Freq: Four times a day (QID) | ORAL | Status: DC | PRN
  Administered 2018-06-20: 15 mL via ORAL
  Filled 2018-06-20: qty 30

## 2018-06-20 MED ORDER — PANTOPRAZOLE SODIUM 40 MG PO TBEC
40.0000 mg | DELAYED_RELEASE_TABLET | Freq: Every day | ORAL | Status: DC
  Administered 2018-06-20 – 2018-06-25 (×6): 40 mg via ORAL
  Filled 2018-06-20 (×9): qty 1

## 2018-06-20 MED ORDER — VITAMIN B-1 100 MG PO TABS
100.0000 mg | ORAL_TABLET | Freq: Every day | ORAL | Status: DC
  Administered 2018-06-20 – 2018-06-25 (×6): 100 mg via ORAL
  Filled 2018-06-20 (×8): qty 1

## 2018-06-20 NOTE — Progress Notes (Signed)
Pt attended goals and orientation group this morning. Pt goal for the day is to go home

## 2018-06-20 NOTE — Progress Notes (Signed)
D. Pt presents with an anxious affect congruent with mood- observed interacting well with peers in dayroom. Pt attended goals and orientation group, but stayed in bed during group led by SW. Per pt's self inventory, pt rates her depression, hopelessness and anxiety a 3/0/0, respectively. Pt denies pain and withdrawal symptoms. Pt writes that her most important goal today is "going home" and she will "behave" to meet that goal. Pt currently denies SI/HI and AVH.  A. Labs and vitals monitored. Pt compliant with medications. Pt supported emotionally and encouraged to express concerns and ask questions.   R. Pt remains safe with 15 minute checks. Will continue POC.

## 2018-06-20 NOTE — BHH Group Notes (Signed)
BHH Group Notes:  (Nursing)  Date:  06/20/2018  Time: 1:15 PM Type of Therapy:  Nurse Education  Participation Level:  Did Not Attend  Participation Quality:  Didnt attend  Affect:  Did not attend  Cognitive:  Did not attend  Insight:  None  Engagement in Group:  None  Modes of Intervention:  Education  Summary of Progress/Problems: Patient did not attend  Shela NevinValerie S Maggie Dworkin 06/20/2018, 2:16 PM

## 2018-06-20 NOTE — Progress Notes (Signed)
[facility] MD Progress Note  06/20/2018 12:22 PM Stacie Sanders  MRN:  [id number] Subjective: I feel great I woke up.  No worries about me.  I been putting my husband before my kids for 23 years, and I will not do it anymore.  I need insurance so I can get a psychiatrist in the health that I need by me.  I do not miss the drinking and glad it is over.  Objective:47 y.o.femalewith a history of bipolar disorder, depression, and prior suicide attempt who presents with suicide attempt and intentional overdose.  Patient was admitted to the medical unit in intensive care as she was found to have a decreased mental status and GCS of 3 with difficulty protecting her airway while in route to the hospital.  On evaluation patient was alert and oriented, calm and cooperative.  Patient appears to be minimizing all symptoms at this time as she denies any depression and has minimum anxiety.  She continues to ruminate about wanting to get her family together and how she is ready to discharge.  Patient is preoccupied on discharge and discussed with patient about significant overdose attempt and the need to continue therapy of inpatient at this time.  Patient was not receptive towards feedback stating"I feel great, I woke up this morning.  I have no worries.  I plan to put my heart into my other son.  I have a lawyer for my SSI and I need to update my mental health conditions on their.  And I need to go home because somebody has to be with my son who has a disability."Patient is encouraged to take advantage of the resources, psychiatric milieu, and group therapy that she has available to her.  She is able to establish her goal to include slow down and work on Manufacturing systems engineer.  Discussed with patient the importance of learning about her impulsivity.  Throughout the evaluation patient was observed with lots of hand gestures, psychomotor agitation, rocking back and forth, and mood lability.  She denies any trouble sleeping or  eating.  She currently rates her depression at 3 out of 10 and anxiety 5 out of 10 with 10 being the worst.  She denies any suicidal ideations, homicidal ideations, and or auditory visual hallucinations. she is able to contract for safety while on the unit.   Principal Problem: Bipolar disorder (HCC) Diagnosis:   Patient Active Problem List   Diagnosis Date Noted  . Cocaine use disorder, mild, abuse (HCC) [F14.10]   . 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 Bipolar disorder (HCC) [F31.9] 06/18/2018  . Suicide attempt (HCC) [T14.91XA]   . Fatigue [R53.83] 03/01/2018  . Irritability [R45.4] 03/01/2018  . GAD (generalized anxiety disorder) [F41.1] 03/01/2018  . Gingivitis [K05.10] 03/01/2018  . Grinding of teeth [F45.8] 03/01/2018  . Healthcare maintenance [Z00.00] 02/15/2018  . Depression [F32.9] 02/15/2018  . Bipolar 1 disorder (HCC) [F31.9] 02/15/2018  . Cough in adult [R05] 02/15/2018  . Acute bacterial conjunctivitis of right eye [H10.31] 02/15/2018  . Acute respiratory failure (HCC) [J96.00] 01/03/2016  . Acute respiratory failure with hypoxemia (HCC) [J96.01]   . Acute encephalopathy [G93.40]   . Drug overdose [T50.901A] 12/29/2015  . Overdose [T50.901A] 12/29/2015   Total Time spent with patient: 30 minutes  Past Psychiatric History: Bipolar I, Goes to [facility] regularly, Hospitalized in 2016 for overdose as a suicide attempt after grandson died.   Past Medical History:  Past Medical History:  Diagnosis Date  .  Allergic rhinitis   . Bipolar 1 disorder (HCC)   . Borderline diabetes mellitus   . Colon polyp   . Depression   . Dyspnea on exertion   . Esophageal stricture    scheduled for EGD and dilation 03-18-2016  . GAD (generalized anxiety disorder)   . History of acute respiratory failure    12-29-2015  due to polysubstance overdose--  pt intubated --  resolved   . History of febrile seizure    x1  01/ 2017--  none since  . History  of kidney stones    staghorn  . History of suicide attempt    12-29-2015  -- overdose klonopin, xanax, alcohol--  acute respirtory failure and acute encenphalopathy -- both resolved  . IBS (irritable bowel syndrome)   . Irregular menstrual cycle   . Renal calculi    bilateral    Past Surgical History:  Procedure Laterality Date  . COLONOSCOPY    . CYSTOSCOPY WITH STENT PLACEMENT Bilateral 03/10/2016   Procedure: CYSTOSCOPY WITH STENT PLACEMENT;  Surgeon: Malen GauzePatrick L McKenzie, MD;  [facility];  Service: Urology;  Laterality: Bilateral;  . CYSTOSCOPY/RETROGRADE/URETEROSCOPY/STONE EXTRACTION WITH BASKET Bilateral 03/10/2016   Procedure: CYSTOSCOPY/RETROGRADE/URETEROSCOPY/STONE EXTRACTION WITH BASKET;  Surgeon: Malen GauzePatrick L McKenzie, MD;  [facility];  Service: Urology;  Laterality: Bilateral;  . EXTRACORPOREAL SHOCK WAVE LITHOTRIPSY  x2    . PERCUTANEOUS NEPHROSTOLITHOTOMY Right 09-18-2005   staghorn  . TUBAL LIGATION  1999   Family History:  Family History  Problem Relation Age of Onset  . Cancer Father        pancreas  . Melanoma Father   . Cancer Maternal Grandmother        lung  . COPD Paternal Grandmother   . Melanoma Paternal Grandmother    Family Psychiatric  History: Dad - schizophrenia   Social History:  Social History   Substance and Sexual Activity  Alcohol Use Yes   Comment: occ     Social History   Substance and Sexual Activity  Drug Use No    Social History   Socioeconomic History  . Marital status: Married    Spouse name: Not on file  . Number of children: Not on file  . Years of education: Not on file  . Highest education level: Not on file  Occupational History  . Not on file  Social Needs  . Financial resource strain: Not on file  . Food insecurity:    Worry: Not on file    Inability: Not on file  . Transportation needs:    Medical: Not on file    Non-medical: Not on file  Tobacco Use  .  Smoking status: Current Every Day Smoker    Packs/day: 0.50    Years: 20.00    Pack years: 10.00    Types: Cigarettes  . Smokeless tobacco: Never Used  Substance and Sexual Activity  . Alcohol use: Yes    Comment: occ  . Drug use: No  . Sexual activity: Yes    Birth control/protection: Surgical  Lifestyle  . Physical activity:    Days per week: 0 days    Minutes per session: 0 min  . Stress: To some extent  Relationships  . Social connections:    Talks on phone: More than three times a week    Gets together: Never    Attends religious service: Never    Active member of club or organization: No    Attends meetings  of clubs or organizations: Never    Relationship status: Married  Other Topics Concern  . Not on file  Social History Narrative  . Not on file   Additional Social History:    Pain Medications: see mar Prescriptions: see mar Over the Counter: see mar History of alcohol / drug use?: Yes Longest period of sobriety (when/how long): unkwon Negative Consequences of Use: Legal Withdrawal Symptoms: Other (Comment)(None)        Sleep: Fair  Appetite:  Fair  Current Medications: Current Facility-Administered Medications  Medication Dose Route Frequency Provider Last Rate Last Dose  . albuterol (PROVENTIL) (2.5 MG/3ML) 0.083% nebulizer solution 2.5 mg  2.5 mg Nebulization Q2H PRN Rankin, Shuvon B, NP      . amoxicillin-clavulanate (AUGMENTIN) 875-125 MG per tablet 1 tablet  1 tablet Oral BID Rankin, Shuvon B, NP   1 tablet at 06/20/18 0731  . buPROPion [facility]) tablet 100 mg  100 mg Oral BID Rankin, Shuvon B, NP   100 mg at 06/20/18 0731  . gabapentin (NEURONTIN) capsule 400 mg  400 mg Oral TID Rankin, Shuvon B, NP   400 mg at 06/20/18 1144  . ibuprofen (ADVIL,MOTRIN) tablet 600 mg  600 mg Oral Q6H PRN Kerry Hough, PA-C   600 mg at 06/19/18 2135  . PARoxetine (PAXIL) tablet 60 mg  60 mg Oral QHS Rankin, Shuvon B, NP   60 mg at 06/19/18 2121  . QUEtiapine  (SEROQUEL) tablet 300 mg  300 mg Oral QHS Rankin, Shuvon B, NP   300 mg at 06/19/18 2121  . thiamine (VITAMIN B-1) tablet 100 mg  100 mg Oral Daily Money, Gerlene Burdock, FNP   100 mg at 06/20/18 1144    Lab Results: No results found for this or any previous visit (from the past 48 hour(s)).  Blood Alcohol level:  Lab Results  Component Value Date   ETH 12 (H) 06/14/2018   ETH 117 (H) 06/14/2018    Metabolic Disorder Labs: Lab Results  Component Value Date   HGBA1C 5.5 03/01/2018   No results found for: PROLACTIN Lab Results  Component Value Date   CHOL 204 (H) 03/01/2018   TRIG 258 (H) 03/01/2018   HDL 62 03/01/2018   CHOLHDL 3.3 03/01/2018   LDLCALC 90 03/01/2018    Physical Findings: AIMS: Facial and Oral Movements Muscles of Facial Expression: None, normal Lips and Perioral Area: None, normal Jaw: None, normal Tongue: None, normal,Extremity Movements Upper (arms, wrists, hands, fingers): None, normal Lower (legs, knees, ankles, toes): None, normal, Trunk Movements Neck, shoulders, hips: None, normal, Overall Severity Severity of abnormal movements (highest score from questions above): None, normal Incapacitation due to abnormal movements: None, normal Patient's awareness of abnormal movements (rate only patient's report): No Awareness, Dental Status Current problems with teeth and/or dentures?: No Does patient usually wear dentures?: No  CIWA:  CIWA-Ar Total: 3 COWS:  COWS Total Score: 2  Musculoskeletal: Strength & Muscle Tone: within normal limits Gait & Station: normal Patient leans: N/A  Psychiatric Specialty Exam: Physical Exam  ROS  Blood pressure 95/63, pulse 89, temperature 98.4 F (36.9 C), temperature source Oral, resp. rate 20, height 5\' 5"  (1.651 m), weight 93.9 kg (207 lb), SpO2 100 %.Body mass index is 34.45 kg/m.  General Appearance: Fairly Groomed  Eye Contact:  Fair  Speech:  Clear and Coherent and Normal Rate  Volume:  Increased  Mood:  I  feel better  Affect:  Depressed, restricted, and brightens upon approach.  Thought  Process:  Irrelevant and Descriptions of Associations: Tangential  Orientation:  Full (Time, Place, and Person)  Thought Content:  Rumination and Tangential  Suicidal Thoughts:  No  Homicidal Thoughts:  No  Memory:  Immediate;   Fair Recent;   Fair  Judgement:  Impaired  Insight:  Shallow  Psychomotor Activity:  Normal  Concentration:  Concentration: Fair and Attention Span: Fair  Recall:  [employer] of Knowledge:  Fair  Language:  Fair  Akathisia:  No  Handed:  Right  AIMS (if indicated):     Assets:  Communication Skills Desire for Improvement Financial Resources/Insurance Leisure Time Physical Health Transportation Vocational/Educational  ADL's:  Intact  Cognition:  WNL  Sleep:  Number of Hours: 5.75     Treatment Plan Summary: Daily contact with patient to assess and evaluate symptoms and progress in treatment and Medication management   We need to get collateral information from family as well as [facility] psychiatrist at [facility].  She will unfortunately have to be placed under involuntary commitment.  We will continue her Seroquel, Paxil, Wellbutrin and gabapentin at their current dosages.   Truman Hayward, FNP 06/20/2018, 12:22 PM

## 2018-06-20 NOTE — [facility] Suicide Risk Assessment (Addendum)
[facility] INPATIENT:  Family/Significant Other Suicide Prevention Education  Suicide Prevention Education:  Contact Attempts:  son Arlyss QueenJack Meador [phone] has been identified by the patient as the family member/significant other with whom the patient will be residing, and identified as the person(s) who will aid the patient in the event of a mental health crisis.  With written consent from the patient, two attempts were made to provide suicide prevention education, prior to and/or following the patient's discharge.  We were unsuccessful in providing suicide prevention education.  A suicide education pamphlet was given to the patient to share with family/significant other.  Suicide Prevention Education was reviewed thoroughly with patient, including risk factors, warning signs, and what to do.  Mobile Crisis services were described and that telephone number pointed out, with encouragement to patient to put this number in personal cell phone.  Brochure was provided to patient to share with natural supports.  Patient acknowledged the ways in which they are at risk, and how working through each of their issues can gradually start to reduce their risk factors.  Patient was encouraged to think of the information in the context of people in their own lives.  Patient denied having access to firearms  Patient verbalized understanding of information provided.  Patient endorsed a desire to live.     Date and time of first attempt:  06/20/2018  /  4:53 PM  - contacted son, but he was out with his children and stated he could not talk at the moment, would like us to try again tomorrow.  He did state he does not feel patient is ready to discharge, saying she needs to stay and get needed help.  Date and time of second attempt:   To be done    Lynnell ChadMareida J Grossman-Orr 06/20/2018, 4:53 PM

## 2018-06-20 NOTE — Progress Notes (Signed)
Patient ID: Stacie Sanders, female   DOB: 1971/12/09, 47 y.o.   MRN: [id number] D: Patient standing at the nurses station on approach. Pt has been standing there most of the evening. Pt reports feeling sad about the end of her marriage and not been able to be home with her son. Pt mood and affect appears depressed flat. Pt did not attend evening wrap up group. Denies  SI/HI/AVH and pain.No behavioral issues noted.  A: Support and encouragement offered as needed. Medications administered as prescribed.  R: Patient is safe and cooperative on unit. Will continue to monitor  for safety and stability.

## 2018-06-20 NOTE — [facility] Group Notes (Signed)
[facility] LCSW Group Therapy Note  06/20/2018  10:00-11:00AM  Type of Therapy and Topic:  Group Therapy:  Being Your Own Support  Participation Level:  Did Not Attend   Description of Group:  Patients in this group were introduced to the concept that self-support is an essential part of recovery.  A song entitled "My Own Hero" was played and a group discussion ensued in which patients stated they could relate to the song and it inspired them to realize they have be willing to help themselves in order to succeed, because other people cannot achieve sobriety or stability for them.  We discussed adding a variety of healthy supports to address the various needs in their lives.  A song was played called "I Know Where I've Been" toward the end of group and used to conduct an inspirational wrap-up to group of remembering how far they have already come in their journey.  Therapeutic Goals: 1)  demonstrate the importance of being a part of one's own support system 2)  discuss reasons people in one's life may eventually be unable to be continually supportive  3)  identify the patient's current support system and   4)  elicit commitments to add healthy supports and to become more conscious of being self-supportive   Summary of Patient Progress:  N/A   Therapeutic Modalities:   Motivational Interviewing Activity  Denym Christenberry J Grossman-Orr       

## 2018-06-21 MED ORDER — QUETIAPINE FUMARATE 400 MG PO TABS
400.0000 mg | ORAL_TABLET | Freq: Every day | ORAL | Status: DC
  Administered 2018-06-21 – 2018-06-22 (×2): 400 mg via ORAL
  Filled 2018-06-21 (×4): qty 1

## 2018-06-21 NOTE — Progress Notes (Signed)
Pt on unit on phone in dayroom.  Pt off phone when group starts and does not attend.  Pt c/o anxiety.  Pt sts her husband has her $300.00 and she cant get any cigarettes.  Pt is tearful at times changing to anger.  Pt is med compliant.  Pt denies SI, HI and AVH.  Pt denies any pain at this time. Pt gets snack and returns to room. Pt remains safe on unit at this time.

## 2018-06-21 NOTE — Tx Team (Signed)
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 albuterol (PROVENTIL) (2.5 MG/3ML) 0.083% nebulizer solution 2.5 mg  2.5 mg Nebulization Q2H PRN Rankin, Shuvon B, NP      . alum & mag hydroxide-simeth (MAALOX/MYLANTA) 200-200-20 MG/5ML suspension 15 mL  15 mL Oral Q6H PRN Antonieta Pert, MD   15 mL at 06/20/18 1453  . amoxicillin-clavulanate (AUGMENTIN) 875-125 MG per tablet 1 tablet  1 tablet Oral BID Rankin, Shuvon B, NP   1 tablet at 06/21/18 0800  . buPROPion [facility]) tablet 100 mg  100 mg Oral BID Rankin, Shuvon B, NP   100 mg at 06/21/18 0759  . gabapentin (NEURONTIN) capsule 400 mg  400 mg Oral TID Rankin, Shuvon B, NP   400 mg at 06/21/18 0800  . ibuprofen (ADVIL,MOTRIN) tablet 600 mg  600 mg Oral Q6H PRN Kerry Hough, PA-C   600 mg at 06/20/18 1457  . pantoprazole (PROTONIX) EC tablet 40 mg  40 mg Oral Daily Antonieta Pert, MD   40 mg at 06/21/18 0800  . PARoxetine (PAXIL) tablet 60 mg  60 mg Oral QHS Rankin, Shuvon B, NP   60 mg at 06/20/18 2123  . QUEtiapine (SEROQUEL) tablet 300 mg  300 mg Oral QHS Rankin, Shuvon B, NP   300 mg at 06/20/18 2123  . thiamine (VITAMIN B-1) tablet 100 mg  100 mg Oral Daily Money, Gerlene Burdock, FNP   100 mg at 06/21/18 0800   PTA Medications: Medications Prior to Admission  Medication Sig Dispense Refill Last Dose  . albuterol (PROVENTIL) (2.5 MG/3ML) 0.083% nebulizer solution Take 3 mLs (2.5 mg total) by nebulization every 2 (two) hours as needed for wheezing. 75 mL 12   . amoxicillin-clavulanate  (AUGMENTIN) 875-125 MG tablet Take 1 tablet by mouth 2 (two) times daily. For 4 days from 6/21     . famotidine (PEPCID) 20 MG tablet Take 1 tablet (20 mg total) by mouth daily.     Marland Kitchen OLANZapine (ZYPREXA) 2.5 MG tablet Take 1 tablet (2.5 mg total) by mouth at bedtime.     . thiamine (B-1) 100 MG/ML injection Inject 1 mL (100 mg total) into the vein daily. 25 mL      Patient Stressors: Paediatric nurse issue Marital or family conflict Medication change or noncompliance Substance abuse  Patient Strengths: Motivation for treatment/growth Physical Health Supportive family/friends  Treatment Modalities: Medication Management, Group therapy, Case management,  1 to 1 session with clinician, Psychoeducation, Recreational therapy.   Physician Treatment Plan for Primary Diagnosis: Bipolar disorder (HCC) Long Term Goal(s): Improvement in symptoms so as ready for discharge Improvement in symptoms so as ready for discharge   Short Term Goals: Ability to identify changes in lifestyle to reduce recurrence of condition will improve Ability to verbalize feelings will improve Ability to disclose and discuss suicidal ideas Ability to demonstrate self-control will improve Ability to identify and develop effective coping behaviors will improve Ability to maintain clinical measurements within normal limits will improve Compliance with prescribed medications will improve  Medication Management: Evaluate patient's response, side effects, and tolerance of medication regimen.  Therapeutic  Interventions: 1 to 1 sessions, Unit Group sessions and Medication administration.  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)  Long Term Goal(s): Improvement in symptoms so as ready for  discharge Improvement in symptoms so as ready for discharge   Short Term Goals: Ability to identify changes in lifestyle to reduce recurrence of condition will improve Ability to verbalize feelings will improve Ability to disclose and discuss suicidal ideas Ability to demonstrate self-control will improve Ability to identify and develop effective coping behaviors will improve Ability to maintain clinical measurements within normal limits will improve Compliance with prescribed medications will improve     Medication Management: Evaluate patient's response, side effects, and tolerance of medication regimen.  Therapeutic Interventions: 1 to 1 sessions, Unit Group sessions and Medication administration.  Evaluation of Outcomes: Progressing   RN Treatment Plan for Primary Diagnosis: Bipolar disorder (HCC) Long Term Goal(s): Knowledge of disease and therapeutic regimen to maintain health will improve  Short Term Goals: Ability to remain free from injury will improve, Ability to verbalize frustration and anger appropriately will improve and Ability to verbalize feelings will improve  Medication Management: RN will administer medications as ordered by provider, will assess and evaluate patient's response and provide education to patient for prescribed medication. RN will report any adverse and/or side effects to prescribing provider.  Therapeutic Interventions: 1 on 1 counseling sessions, Psychoeducation, Medication administration, Evaluate responses to treatment, Monitor vital signs and CBGs as ordered, Perform/monitor CIWA, COWS, AIMS and Fall Risk screenings as ordered, Perform wound care treatments as ordered.  Evaluation of Outcomes: Progressing   LCSW Treatment Plan for Primary Diagnosis: Bipolar disorder (HCC) Long Term Goal(s): Safe transition to appropriate next level of care at discharge, Engage patient in therapeutic group addressing interpersonal concerns.  Short Term Goals:  Engage patient in aftercare planning with referrals and resources, Facilitate patient progression through stages of change regarding substance use diagnoses and concerns and Identify triggers associated with mental health/substance abuse issues  Therapeutic Interventions: Assess for all discharge needs, 1 to 1 time with Social worker, Explore available resources and support systems, Assess for adequacy in community support network, Educate family and significant other(s) on suicide prevention, Complete Psychosocial Assessment, Interpersonal group therapy.  Evaluation of Outcomes: Progressing   Progress in Treatment: Attending groups: Yes. Participating in groups: Yes. Taking medication as prescribed: Yes. Toleration medication: Yes. Family/Significant other contact made: No, will contact:  pt's son for collateral contact and to complete SPE with pt's consent.  Patient understands diagnosis: Yes. Discussing patient identified problems/goals with staff: Yes. Medical problems stabilized or resolved: Yes. Denies suicidal/homicidal ideation: Yes. Issues/concerns per patient self-inventory: Yes. Other: n/a   New problem(s) identified: No, Describe:  n/a  New Short Term/Long Term Goal(s): detox, medication management for mood stabilization; elimination of SI thoughts; development of comprehensive mental wellness/sobriety plan.   Patient Goals:  "To get help with my depression and get back on the correct medication."   Discharge Plan or Barriers: CSW assessing for appropriate referrals. [facility] appt made with Merlyn AlbertFred. Pt plans to return home at discharge. [facility] pamphlet, Mobile Crisis information, and AA/NA information provided to patient for additional community support and resources.   Reason for Continuation of Hospitalization: Anxiety Depression Medication stabilization Withdrawal symptoms  Estimated Length of Stay: Thursday, 06/24/18  Attendees: Patient: Stacie Sanders  06/21/2018  10:00 AM   Physician: Dr. Altamese Mercersburg MD; Dr. Jola Babinski MD 06/21/2018 10:00 AM  Nursing: Huntley Dec RN; Marcelino Duster RN 06/21/2018 10:00 AM  RN Care Manager:X 06/21/2018 10:00 AM  Social Worker: Corrie Mckusick LCSW 06/21/2018 10:00 AM  Recreational Therapist: x 06/21/2018 10:00 AM  Other: Armandina Stammer NP; Hillery Jacks NP 06/21/2018 10:00 AM  Other:  06/21/2018 10:00 AM  Other: 06/21/2018 10:00 AM    Scribe for Treatment Team: Rona Ravens, LCSW 06/21/2018 10:00 AM

## 2018-06-21 NOTE — Progress Notes (Signed)
Recreation Therapy Notes  Date: 6.24.19 Time: 0930 Location: [address] Dayroom  Group Topic: Stress Management  Goal Area(s) Addresses:  Patient will verbalize importance of using healthy stress management.  Patient will identify positive emotions associated with healthy stress management.   Behavioral Response: Engaged  Intervention: Stress Management  Activity :  Guided Imagery.  LRT introduced the stress management technique of guided imagery to the group.  LRT read Sanders script that took the patients on Sanders journey to the beach.  Patients were to follow along as the script was read to engage in the activity.  Education:  Stress Management, Discharge Planning.   Education Outcome: Acknowledges edcuation/In group clarification offered/Needs additional education  Clinical Observations/Feedback: Pt attended and participated in group.     Stacie Sanders, LRT/CTRS         Stacie Sanders 06/21/2018 11:19 AM 

## 2018-06-21 NOTE — Progress Notes (Signed)
Patient ID: Gracy Racerracy Leigh Gillin, female   DOB: Feb 18, 1971, 47 y.o.   MRN: [id number] D) Pt has been appropriate and cooperative on approach. Affect bright and animated. Pt focused on d/c, superficial. Positive for groups with minimal prompting. Pt denies s.i., avh. No physical c/o noted. Contracts verbally for safety. A) level 3 obs for safety, support and encouragement provided. Med ed reinforced. R) Cooperative.

## 2018-06-21 NOTE — Progress Notes (Signed)
Adult Psychoeducational Group Note  Date:  06/21/2018 Time:  9:03 AM  Group Topic/Focus:  Orientation:   The focus of this group is to educate the patient on the purpose and policies of crisis stabilization and provide a format to answer questions about their admission.  The group details unit policies and expectations of patients while admitted.  Participation Level:  Active  Participation Quality:  Appropriate  Affect:  Appropriate  Cognitive:  Alert  Insight: Appropriate  Engagement in Group:  Engaged  Modes of Intervention:  Discussion  Additional Comments:  Pt attended group. Pt talked about their goal, Pt talked about the schedule, Introduced the staff, talked about expectations and talked about being supportive to one another.  Stacie ForsterLeonard Sanders Stacie Sanders 06/21/2018, 9:03 AM

## 2018-06-21 NOTE — Progress Notes (Signed)
[facility]  06/21/2018 11:22 AM Stacie Sanders  MRN:  [id number] Subjective: Patient is seen and examined.  Patient is a 47 year old female with a past psychiatric history significant for bipolar disorder who originally [facility] after an intentional overdose of BuSpar, gabapentin, Paxil, Seroquel, Atarax, alcohol and cocaine.  Patient stated that she has been married for over 23 years.  And that her husband told her on the day prior to admission that he was leaving her.  Patient stated this was completely unexpected.  She stated she mixed the alcohol with the pills to "go to sleep" and that this was not a suicide attempt.  Her drug screen on admission was positive for benzodiazepines but otherwise negative.  The patient stated she had had cocaine in the evening prior to the overdose.  This was a significant overdose and the fact that the patient had a seizure upon [facility], and had to be intubated for airway protection.  Since admission the patient has been quite adamant that she is fine and that she is ready for discharge.  She has minimized all of her symptoms and anything to do with the event.  Social work contacted her son, and even though the report was incomplete for the lack of time, he stated that he did not feel as though she was ready to go home.  We did not get a great deal of collateral information.  She has been smiling and happy on the unit (almost to a degree of complete denial) and is a bit irritated over the fact that I am not going to discharge her today.  I explained to her in detail that we needed more collateral information, and that this was a serious attempt, and not something to just be passed over.  She denied any suicidal ideation this morning. Principal Problem: Bipolar disorder (HCC) Diagnosis:   Patient Active Problem List   Diagnosis Date Noted  . Cocaine use disorder, mild, abuse (HCC) [F14.10]   . 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 Bipolar disorder (HCC) [F31.9] 06/18/2018  . Suicide attempt (HCC) [T14.91XA]   . Fatigue [R53.83] 03/01/2018  . Irritability [R45.4] 03/01/2018  . GAD (generalized anxiety disorder) [F41.1] 03/01/2018  . Gingivitis [K05.10] 03/01/2018  . Grinding of teeth [F45.8] 03/01/2018  . Healthcare maintenance [Z00.00] 02/15/2018  . Depression [F32.9] 02/15/2018  . Bipolar 1 disorder (HCC) [F31.9] 02/15/2018  . Cough in adult [R05] 02/15/2018  . Acute bacterial conjunctivitis of right eye [H10.31] 02/15/2018  . Acute respiratory failure (HCC) [J96.00] 01/03/2016  . Acute respiratory failure with hypoxemia (HCC) [J96.01]   . Acute encephalopathy [G93.40]   . Drug overdose [T50.901A] 12/29/2015  . Overdose [T50.901A] 12/29/2015   Total Time spent with patient: 30 minutes  Past Psychiatric History: See admission H&P  Past Medical History:  Past Medical History:  Diagnosis Date  . Allergic rhinitis   . Bipolar 1 disorder (HCC)   . Borderline diabetes mellitus   . Colon polyp   . Depression   . Dyspnea on exertion   . Esophageal stricture    scheduled for EGD and dilation 03-18-2016  . GAD (generalized anxiety disorder)   . History of acute respiratory failure    12-29-2015  due to polysubstance overdose--  pt intubated --  resolved   . History of febrile seizure    x1  01/ 2017--  none since  . History of  kidney stones    staghorn  . History of suicide attempt    12-29-2015  -- overdose klonopin, xanax, alcohol--  acute respirtory failure and acute encenphalopathy -- both resolved  . IBS (irritable bowel syndrome)   . Irregular menstrual cycle   . Renal calculi    bilateral    Past Surgical History:  Procedure Laterality Date  . COLONOSCOPY    . CYSTOSCOPY WITH STENT PLACEMENT Bilateral 03/10/2016   Procedure: CYSTOSCOPY WITH STENT PLACEMENT;  Surgeon: Malen Gauze, MD;  Location: [facility];   Service: Urology;  Laterality: Bilateral;  . CYSTOSCOPY/RETROGRADE/URETEROSCOPY/STONE EXTRACTION WITH BASKET Bilateral 03/10/2016   Procedure: CYSTOSCOPY/RETROGRADE/URETEROSCOPY/STONE EXTRACTION WITH BASKET;  Surgeon: Malen Gauze, MD;  Location: [facility];  Service: Urology;  Laterality: Bilateral;  . EXTRACORPOREAL SHOCK WAVE LITHOTRIPSY  x2    . PERCUTANEOUS NEPHROSTOLITHOTOMY Right 09-18-2005   staghorn  . TUBAL LIGATION  1999   Family History:  Family History  Problem Relation Age of Onset  . Cancer Father        pancreas  . Melanoma Father   . Cancer Maternal Grandmother        lung  . COPD Paternal Grandmother   . Melanoma Paternal Grandmother    Family Psychiatric  History: See admission H&P Social History:  Social History   Substance and Sexual Activity  Alcohol Use Yes   Comment: occ     Social History   Substance and Sexual Activity  Drug Use No    Social History   Socioeconomic History  . Marital status: Married    Spouse name: Not on file  . Number of children: Not on file  . Years of education: Not on file  . Highest education level: Not on file  Occupational History  . Not on file  Social Needs  . Financial resource strain: Not on file  . Food insecurity:    Worry: Not on file    Inability: Not on file  . Transportation needs:    Medical: Not on file    Non-medical: Not on file  Tobacco Use  . Smoking status: Current Every Day Smoker    Packs/day: 0.50    Years: 20.00    Pack years: 10.00    Types: Cigarettes  . Smokeless tobacco: Never Used  Substance and Sexual Activity  . Alcohol use: Yes    Comment: occ  . Drug use: No  . Sexual activity: Yes    Birth control/protection: Surgical  Lifestyle  . Physical activity:    Days per week: 0 days    Minutes per session: 0 min  . Stress: To some extent  Relationships  . Social connections:    Talks on phone: More than three times a week    Gets together: Never     Attends religious service: Never    Active member of club or organization: No    Attends meetings of clubs or organizations: Never    Relationship status: Married  Other Topics Concern  . Not on file  Social History Narrative  . Not on file   Additional Social History:    Pain Medications: see mar Prescriptions: see mar Over the Counter: see mar History of alcohol / drug use?: Yes Longest period of sobriety (when/how long): unkwon Negative Consequences of Use: Legal Withdrawal Symptoms: Other (Comment)(None)                    Sleep: Fair  Appetite:  Good  Current Medications: Current Facility-Administered Medications  Medication Dose Route Frequency Provider Last Rate Last Dose  . albuterol (PROVENTIL) (2.5 MG/3ML) 0.083% nebulizer solution 2.5 mg  2.5 mg Nebulization Q2H PRN Rankin, Shuvon B, NP      . alum & mag hydroxide-simeth (MAALOX/MYLANTA) 200-200-20 MG/5ML suspension 15 mL  15 mL Oral Q6H PRN Antonieta Pert, MD   15 mL at 06/20/18 1453  . amoxicillin-clavulanate (AUGMENTIN) 875-125 MG per tablet 1 tablet  1 tablet Oral BID Rankin, Shuvon B, NP   1 tablet at 06/21/18 0800  . buPROPion [facility]) tablet 100 mg  100 mg Oral BID Rankin, Shuvon B, NP   100 mg at 06/21/18 0759  . gabapentin (NEURONTIN) capsule 400 mg  400 mg Oral TID Rankin, Shuvon B, NP   400 mg at 06/21/18 0800  . ibuprofen (ADVIL,MOTRIN) tablet 600 mg  600 mg Oral Q6H PRN Kerry Hough, PA-C   600 mg at 06/20/18 1457  . pantoprazole (PROTONIX) EC tablet 40 mg  40 mg Oral Daily Antonieta Pert, MD   40 mg at 06/21/18 0800  . PARoxetine (PAXIL) tablet 60 mg  60 mg Oral QHS Rankin, Shuvon B, NP   60 mg at 06/20/18 2123  . QUEtiapine (SEROQUEL) tablet 300 mg  300 mg Oral QHS Rankin, Shuvon B, NP   300 mg at 06/20/18 2123  . thiamine (VITAMIN B-1) tablet 100 mg  100 mg Oral Daily Money, Gerlene Burdock, FNP   100 mg at 06/21/18 0800    Lab Results: No results found for this or any previous visit  (from the past 48 hour(s)).  Blood Alcohol level:  Lab Results  Component Value Date   ETH 12 (H) 06/14/2018   ETH 117 (H) 06/14/2018    Metabolic Disorder Labs: Lab Results  Component Value Date   HGBA1C 5.5 03/01/2018   No results found for: PROLACTIN Lab Results  Component Value Date   CHOL 204 (H) 03/01/2018   TRIG 258 (H) 03/01/2018   HDL 62 03/01/2018   CHOLHDL 3.3 03/01/2018   LDLCALC 90 03/01/2018    Physical Findings: AIMS: Facial and Oral Movements Muscles of Facial Expression: None, normal Lips and Perioral Area: None, normal Jaw: None, normal Tongue: None, normal,Extremity Movements Upper (arms, wrists, hands, fingers): None, normal Lower (legs, knees, ankles, toes): None, normal, Trunk Movements Neck, shoulders, hips: None, normal, Overall Severity Severity of abnormal movements (highest score from questions above): None, normal Incapacitation due to abnormal movements: None, normal Patient's awareness of abnormal movements (rate only patient's report): No Awareness, Dental Status Current problems with teeth and/or dentures?: No Does patient usually wear dentures?: No  CIWA:  CIWA-Ar Total: 3 COWS:  COWS Total Score: 2  Musculoskeletal: Strength & Muscle Tone: within normal limits Gait & Station: shuffle Patient leans: N/A  Psychiatric Specialty Exam: Physical Exam  Nursing note and vitals reviewed. Constitutional: She is oriented to person, place, and time. She appears well-developed and well-nourished.  HENT:  Head: Normocephalic and atraumatic.  Respiratory: Effort normal.  Neurological: She is alert and oriented to person, place, and time.    ROS  Blood pressure (!) 137/94, pulse 89, temperature 98.6 F (37 C), temperature source Oral, resp. rate 16, height 5\' 5"  (1.651 m), weight 93.9 kg (207 lb), SpO2 100 %.Body mass index is 34.45 kg/m.  General Appearance: Casual  Eye Contact:  Good  Speech:  Normal Rate  Volume:  Normal  Mood:   Euthymic  Affect:  Congruent  Thought Process:  Coherent  Orientation:  Full (Time, Place, and Person)  Thought Content:  Logical  Suicidal Thoughts:  No  Homicidal Thoughts:  No  Memory:  Immediate;   Fair Recent;   Fair Remote;   Fair  Judgement:  Intact  Insight:  Lacking  Psychomotor Activity:  Normal  Concentration:  Concentration: Fair and Attention Span: Fair  Recall:  [employer] of Knowledge:  Fair  Language:  Negative  Akathisia:  Negative  Handed:  Right  AIMS (if indicated):     Assets:  Desire for Improvement Housing Physical Health Resilience  ADL's:  Intact  Cognition:  WNL  Sleep:  Number of Hours: 4     Treatment Plan Summary: Daily contact with patient to assess and evaluate symptoms and progress in treatment, Medication management and Plan Patient is seen and examined.  Patient is a 47 year old female with the above-stated past psychiatric history who was admitted after an intentional overdose.  I believe her to be in significant denial about the event.  She does not see the significance of it nor the repercussions to come with that.  I told her we need to get collateral information from her family members, and we are attempting to do that.  She is not manic, but is not sleeping.  I am going to increase her Seroquel to 400 mg p.o. nightly.  She continues on Augmentin for probable aspiration.  That can be stopped after 7 days of treatment.  Otherwise no other changes in medicines.  I have discussed the case with social work, and they will attempt to get more collateral information as to what was happening before hand.  Antonieta Pert, MD 06/21/2018, 11:22 AM

## 2018-06-21 NOTE — [facility] Group Notes (Signed)
LCSW Group Therapy Note   06/21/2018 1:15pm   Type of Therapy and Topic:  Group Therapy:  Overcoming Obstacles   Participation Level:  Did Not Attend--pt invited. Chose to remain in bed.    Description of Group:    In this group patients will be encouraged to explore what they see as obstacles to their own wellness and recovery. They will be guided to discuss their thoughts, feelings, and behaviors related to these obstacles. The group will process together ways to cope with barriers, with attention given to specific choices patients can make. Each patient will be challenged to identify changes they are motivated to make in order to overcome their obstacles. This group will be process-oriented, with patients participating in exploration of their own experiences as well as giving and receiving support and challenge from other group members.   Therapeutic Goals: 1. Patient will identify personal and current obstacles as they relate to admission. 2. Patient will identify barriers that currently interfere with their wellness or overcoming obstacles.  3. Patient will identify feelings, thought process and behaviors related to these barriers. 4. Patient will identify two changes they are willing to make to overcome these obstacles:      Summary of Patient Progress   x   Therapeutic Modalities:   Cognitive Behavioral Therapy Solution Focused Therapy Motivational Interviewing Relapse Prevention Therapy  Rona RavensHeather S Elliyah Liszewski, LCSW 06/21/2018 2:47 PM

## 2018-06-22 ENCOUNTER — Encounter (HOSPITAL_COMMUNITY): Payer: Self-pay | Admitting: Behavioral Health

## 2018-06-22 DIAGNOSIS — F314 Bipolar disorder, current episode depressed, severe, without psychotic features: Secondary | ICD-10-CM

## 2018-06-22 DIAGNOSIS — F17210 Nicotine dependence, cigarettes, uncomplicated: Secondary | ICD-10-CM

## 2018-06-22 NOTE — Progress Notes (Signed)
Recreation Therapy Notes  Animal-Assisted Activity (AAA) Program Checklist/Progress Notes Patient Eligibility Criteria Checklist & Daily Group note for Rec Tx Intervention  Date: 6.25.19 Time: 1430 Location: [address] Dayroom   AAA/T Program Assumption of Risk Form signed by Patient/ or Parent Legal Guardian  YES   Patient is free of allergies or sever asthma  YES  Patient reports no fear of animals YES   Patient reports no history of cruelty to animals YES   Patient understands his/her participation is voluntary YES   Patient washes hands before animal contact YES   Patient washes hands after animal contact YES   Education: Hand Washing, Appropriate Animal Interaction   Education Outcome: Acknowledges understanding/In group clarification offered/Needs additional education.   Clinical Observations/Feedback: Pt did not attend group.    [employer] [place], LRT/CTRS         Shaleah Nissley A 06/22/2018 4:02 PM 

## 2018-06-22 NOTE — Progress Notes (Signed)
D: Patient's mood is stable. She states she does not feel as depressed.  She minimizes her reason for being here and is focused on discharge.  Patient has a seizure in the ED due to an overdose.  She is sleeping fair; her appetite is fair; her energy level is normal and her concentration is good.  Patient denies any thoughts of self harm and keeps stating, "I just want to leave."  A: Continue to monitor medication management and MD orders.  Safety checks continued every 15 minutes per protocol.  Offer support and encouragement as needed.  R: Patient is receptive to staff;her behavior is appropriate.

## 2018-06-22 NOTE — Plan of Care (Signed)
  Problem: Self-Concept: Goal: Level of anxiety will decrease Outcome: Progressing Goal: Ability to modify response to factors that promote anxiety will improve Outcome: Progressing   Problem: Education: Goal: Ability to state activities that reduce stress will improve Outcome: Not Progressing   Problem: Coping: Goal: Ability to identify and develop effective coping behavior will improve 06/22/2018 1843 by Angela AdamBeaudry, Tedrick [place] E, RN Outcome: Adequate for Discharge 06/22/2018 1836 by Angela AdamBeaudry, Charleton Deyoung E, RN Outcome: Progressing   Problem: Self-Concept: Goal: Ability to identify factors that promote anxiety will improve Outcome: Adequate for Discharge

## 2018-06-22 NOTE — Progress Notes (Signed)
Pt attended goals and orientation group this morning. Pt goal is to go home and work on life alcohol and drug free.

## 2018-06-22 NOTE — Progress Notes (Addendum)
[facility]  06/22/2018 11:37 AM Stacie Sanders  MRN:  [id number]   Subjective: " Doing much better. Was hoping I could get out today. I have everything in place. My plan is to continue therapy once I leave to continue to work on coping skills."  Objective: Face to face evaluation completed, case discussed with treatment team and  chart reviewed. Patient is a 47 year old Stacie Sanders with a past psychiatric history significant for bipolar disorder who [facility] after an intentional overdose of BuSpar, gabapentin, Paxil, Seroquel, Atarax, alcohol and cocaine.    On evaluation, patient is alert and oriented x4, calm and cooperative. She endorses overall improvement in mood and rates her day as 8/10 with 1 being the worse. [facility] with a significant substance abuse history although she minimizes her substance use during this evaluation. She denies withdrawal symptoms and there are none observed. She overdosed prior to her admission which caused some seizure activity although she has not presented with and other seizure activity since her admission to the unit. She minimizes and SI at this time and is contracting for safety on the unit. She denies HI or AVH and does not appear internally preoccupied. She denies concerns with appetite or resting pattern. She endorses her plans upon discharge is to continue out[patient treatment and medication as well as work on coping mechanisms for current mental health state. She maintaining safety on the unit by non engagement in any self-harming acts. There are no manic states observered.     Principal Problem: Bipolar disorder (HCC) Diagnosis:   Patient Active Problem List   Diagnosis Date Noted  . Cocaine use disorder, mild, abuse (HCC) [F14.10]   . 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 Bipolar disorder (HCC) [F31.9] 06/18/2018  . Suicide attempt (HCC) [T14.91XA]   .  Fatigue [R53.83] 03/01/2018  . Irritability [R45.4] 03/01/2018  . GAD (generalized anxiety disorder) [F41.1] 03/01/2018  . Gingivitis [K05.10] 03/01/2018  . Grinding of teeth [F45.8] 03/01/2018  . Healthcare maintenance [Z00.00] 02/15/2018  . Depression [F32.9] 02/15/2018  . Bipolar 1 disorder (HCC) [F31.9] 02/15/2018  . Cough in adult [R05] 02/15/2018  . Acute bacterial conjunctivitis of right eye [H10.31] 02/15/2018  . Acute respiratory failure (HCC) [J96.00] 01/03/2016  . Acute respiratory failure with hypoxemia (HCC) [J96.01]   . Acute encephalopathy [G93.40]   . Drug overdose [T50.901A] 12/29/2015  . Overdose [T50.901A] 12/29/2015   Total Time spent with patient: 30 minutes  Past Psychiatric History: See admission H&P  Past Medical History:  Past Medical History:  Diagnosis Date  . Allergic rhinitis   . Bipolar 1 disorder (HCC)   . Borderline diabetes mellitus   . Colon polyp   . Depression   . Dyspnea on exertion   . Esophageal stricture    scheduled for EGD and dilation 03-18-2016  . GAD (generalized anxiety disorder)   . History of acute respiratory failure    12-29-2015  due to polysubstance overdose--  pt intubated --  resolved   . History of febrile seizure    x1  01/ 2017--  none since  . History of kidney stones    staghorn  . History of suicide attempt    12-29-2015  -- overdose klonopin, xanax, alcohol--  acute respirtory failure and acute encenphalopathy -- both resolved  . IBS (irritable bowel syndrome)   . Irregular menstrual cycle   . Renal calculi    bilateral  Past Surgical History:  Procedure Laterality Date  . COLONOSCOPY    . CYSTOSCOPY WITH STENT PLACEMENT Bilateral 03/10/2016   Procedure: CYSTOSCOPY WITH STENT PLACEMENT;  Surgeon: Malen Gauze, MD;  Location: [facility];  Service: Urology;  Laterality: Bilateral;  . CYSTOSCOPY/RETROGRADE/URETEROSCOPY/STONE EXTRACTION WITH BASKET Bilateral 03/10/2016   Procedure:  CYSTOSCOPY/RETROGRADE/URETEROSCOPY/STONE EXTRACTION WITH BASKET;  Surgeon: Malen Gauze, MD;  Location: [facility];  Service: Urology;  Laterality: Bilateral;  . EXTRACORPOREAL SHOCK WAVE LITHOTRIPSY  x2    . PERCUTANEOUS NEPHROSTOLITHOTOMY Right 09-18-2005   staghorn  . TUBAL LIGATION  1999   Family History:  Family History  Problem Relation Age of Onset  . Cancer Father        pancreas  . Melanoma Father   . Cancer Maternal Grandmother        lung  . COPD Paternal Grandmother   . Melanoma Paternal Grandmother    Family Psychiatric  History: See admission H&P Social History:  Social History   Substance and Sexual Activity  Alcohol Use Yes   Comment: occ     Social History   Substance and Sexual Activity  Drug Use No    Social History   Socioeconomic History  . Marital status: Married    Spouse name: Not on file  . Number of children: Not on file  . Years of education: Not on file  . Highest education level: Not on file  Occupational History  . Not on file  Social Needs  . Financial resource strain: Not on file  . Food insecurity:    Worry: Not on file    Inability: Not on file  . Transportation needs:    Medical: Not on file    Non-medical: Not on file  Tobacco Use  . Smoking status: Current Every Day Smoker    Packs/day: 0.50    Years: 20.00    Pack years: 10.00    Types: Cigarettes  . Smokeless tobacco: Never Used  Substance and Sexual Activity  . Alcohol use: Yes    Comment: occ  . Drug use: No  . Sexual activity: Yes    Birth control/protection: Surgical  Lifestyle  . Physical activity:    Days per week: 0 days    Minutes per session: 0 min  . Stress: To some extent  Relationships  . Social connections:    Talks on phone: More than three times a week    Gets together: Never    Attends religious service: Never    Active member of club or organization: No    Attends meetings of clubs or organizations: Never     Relationship status: Married  Other Topics Concern  . Not on file  Social History Narrative  . Not on file   Additional Social History:    Pain Medications: see mar Prescriptions: see mar Over the Counter: see mar History of alcohol / drug use?: Yes Longest period of sobriety (when/how long): unkwon Negative Consequences of Use: Legal Withdrawal Symptoms: Other (Comment)(None)                    Sleep: Fair  Appetite:  Good  Current Medications: Current Facility-Administered Medications  Medication Dose Route Frequency Provider Last Rate Last Dose  . albuterol (PROVENTIL) (2.5 MG/3ML) 0.083% nebulizer solution 2.5 mg  2.5 mg Nebulization Q2H PRN Rankin, Shuvon B, NP      . alum & mag hydroxide-simeth (MAALOX/MYLANTA) 200-200-20 MG/5ML suspension 15 mL  15  mL Oral Q6H PRN Antonieta Pert, MD   15 mL at 06/20/18 1453  . buPROPion [facility]) tablet 100 mg  100 mg Oral BID Rankin, Shuvon B, NP   100 mg at 06/22/18 0813  . gabapentin (NEURONTIN) capsule 400 mg  400 mg Oral TID Rankin, Shuvon B, NP   400 mg at 06/22/18 0813  . ibuprofen (ADVIL,MOTRIN) tablet 600 mg  600 mg Oral Q6H PRN Kerry Hough, PA-C   600 mg at 06/20/18 1457  . pantoprazole (PROTONIX) EC tablet 40 mg  40 mg Oral Daily Antonieta Pert, MD   40 mg at 06/22/18 0813  . PARoxetine (PAXIL) tablet 60 mg  60 mg Oral QHS Rankin, Shuvon B, NP   60 mg at 06/21/18 2128  . QUEtiapine (SEROQUEL) tablet 400 mg  400 mg Oral QHS Antonieta Pert, MD   400 mg at 06/21/18 2128  . thiamine (VITAMIN B-1) tablet 100 mg  100 mg Oral Daily Money, Gerlene Burdock, FNP   100 mg at 06/22/18 1610    Lab Results: No results found for this or any previous visit (from the past 48 hour(s)).  Blood Alcohol level:  Lab Results  Component Value Date   ETH 12 (H) 06/14/2018   ETH 117 (H) 06/14/2018    Metabolic Disorder Labs: Lab Results  Component Value Date   HGBA1C 5.5 03/01/2018   No results found for: PROLACTIN Lab  Results  Component Value Date   CHOL 204 (H) 03/01/2018   TRIG 258 (H) 03/01/2018   HDL 62 03/01/2018   CHOLHDL 3.3 03/01/2018   LDLCALC 90 03/01/2018    Physical Findings: AIMS: Facial and Oral Movements Muscles of Facial Expression: None, normal Lips and Perioral Area: None, normal Jaw: None, normal Tongue: None, normal,Extremity Movements Upper (arms, wrists, hands, fingers): None, normal Lower (legs, knees, ankles, toes): None, normal, Trunk Movements Neck, shoulders, hips: None, normal, Overall Severity Severity of abnormal movements (highest score from questions above): None, normal Incapacitation due to abnormal movements: None, normal Patient's awareness of abnormal movements (rate only patient's report): No Awareness, Dental Status Current problems with teeth and/or dentures?: No Does patient usually wear dentures?: No  CIWA:  CIWA-Ar Total: 3 COWS:  COWS Total Score: 2  Musculoskeletal: Strength & Muscle Tone: within normal limits Gait & Station: shuffle Patient leans: N/A  Psychiatric Specialty Exam: Physical Exam  Nursing note and vitals reviewed. Constitutional: She is oriented to person, place, and time. She appears well-developed and well-nourished.  HENT:  Head: Normocephalic and atraumatic.  Respiratory: Effort normal.  Neurological: She is alert and oriented to person, place, and time.    Review of Systems  Psychiatric/Behavioral: Positive for substance abuse. Negative for depression, hallucinations, memory loss and suicidal ideas. The patient is not nervous/anxious and does not have insomnia.   All other systems reviewed and are negative.   Blood pressure 94/70, pulse 84, temperature 98.2 F (36.8 C), temperature source Oral, resp. rate 16, height 5\' 5"  (1.651 m), weight 93.9 kg (207 lb), SpO2 100 %.Body mass index is 34.45 kg/m.  General Appearance: Casual  Eye Contact:  Good  Speech:  Normal Rate  Volume:  Normal  Mood:  Euthymic  Affect:   Congruent  Thought Process:  Coherent  Orientation:  Full (Time, Place, and Person)  Thought Content:  Logical  Suicidal Thoughts:  No  Homicidal Thoughts:  No  Memory:  Immediate;   Fair Recent;   Fair Remote;   Fair  Judgement:  Intact  Insight:  Lacking  Psychomotor Activity:  Normal  Concentration:  Concentration: Fair and Attention Span: Fair  Recall:  [employer] of Knowledge:  Fair  Language:  Negative  Akathisia:  Negative  Handed:  Right  AIMS (if indicated):     Assets:  Desire for Improvement Housing Physical Health Resilience  ADL's:  Intact  Cognition:  WNL  Sleep:  Number of Hours: 6.5     Treatment Plan Summary: Reviewed current treatment plan. Will continue the following without adjustments at this time;  Daily contact with patient to assess and evaluate symptoms and progress in treatment,   Medication management and Plan. Patient continues to minimize.prior to admission event. She appears to be focused on discharge. Will continue Seroquel to 400 mg p.o. nightly.  Continued Wellbutrin 100 mg po daily for depression, Neurontin 400 mg TID for anxiety and mood, Paxil 60 mg po daily for depression, Vitamin B 100 mg po daily for alcohol abuse, Protonix EC 40 mg po daily for GERD. Augmentin used for probable aspiration dose is completed. Social work will attempt to get more collateral information as to what was happening before hand.    Denzil Magnuson, NP 06/22/2018, 11:37 AM    Patient ID: Stacie Sanders, Stacie Sanders   DOB: 04/11/1971, 47 y.o.   MRN: [id number] .Marland KitchenAgree with NP Progress Note

## 2018-06-22 NOTE — [facility] Group Notes (Signed)
[facility] Group Therapy 06/22/2018 1:15pm  Type of Therapy: Mental Health Association Presentation  Participation Level: Active  Participation Quality: Attentive  Affect: Appropriate  Cognitive: Oriented  Insight: Developing/Improving  Engagement in Therapy: Engaged  Modes of Intervention: Discussion, Education and Socialization  Summary of Progress/Problems: Mental Health Association (MHA) Speaker came to talk about his personal journey with mental health. The pt processed ways by which to relate to the speaker. MHA speaker provided handouts and educational information pertaining to groups and services offered by the [facility]. Pt was engaged in speaker's presentation and was receptive to resources provided.   Rona RavensHeather S Rosilyn Coachman, LCSW 06/22/2018 1:54 PM

## 2018-06-22 NOTE — Progress Notes (Signed)
Pt was observed resting in room with eyes open. Pt was flat/irritable in affect and mood. Pt denies SI/HI/AVH/Pain at this time. Pt requested bedtime meds early so that she can go to sleep. Pt did not attend wrap-up group. Later in the evening, writer observed Pt in the dayroom interacting with peers. Pt approached Clinical research associatewriter and states "My head is racing, I just can't go to sleep". Pt was informed that were no PRNs at this time. Pt was encourage to speak with provider. Pt appears to be displaying med-seeking behavior and remains preoccupied with discharge. Will continue with POC.

## 2018-06-22 NOTE — Plan of Care (Signed)
  Problem: Coping: Goal: Ability to identify and develop effective coping behavior will improve Outcome: Progressing   Problem: Education: Goal: Ability to state activities that reduce stress will improve Outcome: Not Progressing

## 2018-06-23 DIAGNOSIS — Z79899 Other long term (current) drug therapy: Secondary | ICD-10-CM

## 2018-06-23 DIAGNOSIS — F1910 Other psychoactive substance abuse, uncomplicated: Secondary | ICD-10-CM

## 2018-06-23 MED ORDER — PAROXETINE HCL 20 MG PO TABS
20.0000 mg | ORAL_TABLET | Freq: Every day | ORAL | Status: DC
  Administered 2018-06-23 – 2018-06-24 (×2): 20 mg via ORAL
  Filled 2018-06-23 (×4): qty 1

## 2018-06-23 MED ORDER — NICOTINE POLACRILEX 2 MG MT GUM
2.0000 mg | CHEWING_GUM | OROMUCOSAL | Status: DC | PRN
Start: 2018-06-23 — End: 2018-06-25
  Administered 2018-06-23 – 2018-06-25 (×6): 2 mg via ORAL
  Filled 2018-06-23 (×3): qty 1

## 2018-06-23 MED ORDER — QUETIAPINE FUMARATE 300 MG PO TABS
450.0000 mg | ORAL_TABLET | Freq: Every day | ORAL | Status: DC
  Administered 2018-06-23: 450 mg via ORAL
  Filled 2018-06-23 (×3): qty 2

## 2018-06-23 MED ORDER — HYDROXYZINE HCL 25 MG PO TABS
25.0000 mg | ORAL_TABLET | Freq: Four times a day (QID) | ORAL | Status: DC | PRN
  Administered 2018-06-23 – 2018-06-24 (×4): 25 mg via ORAL
  Filled 2018-06-23 (×3): qty 1
  Filled 2018-06-23: qty 10

## 2018-06-23 MED ORDER — PAROXETINE HCL 20 MG PO TABS: 40.0000 mg | ORAL_TABLET | Freq: Every day | ORAL | Status: DC

## 2018-06-23 NOTE — Plan of Care (Signed)
  Problem: Coping: Goal: Ability to identify and develop effective coping behavior will improve Outcome: Not Progressing   Problem: Self-Concept: Goal: Ability to identify factors that promote anxiety will improve Outcome: Not Progressing Goal: Level of anxiety will decrease Outcome: Not Progressing Goal: Ability to modify response to factors that promote anxiety will improve Outcome: Not Progressing

## 2018-06-23 NOTE — Progress Notes (Signed)
Patient approached nurse earlier and stated, "I can't stop biting my lip."  She proceeded to keep biting her lip while talking to me.  Observed her on the phone later and she was talking normal and not biting her lip.  She received a vistaril 25 mg per MD order.

## 2018-06-23 NOTE — Progress Notes (Addendum)
[facility] MD Progress Note  06/23/2018 1:54 PM Stacie Sanders  MRN:  [id number]   Subjective: Patient reports she feels better than she did on admission, as she improves she is focusing more on discharge and disposition planning.  Denies medication side effects  Objective: I have discussed case with treatment team and have met with patient along with CSW. 47 year old female, history of mood disorder, has been diagnosed with bipolar disorder in the past, required medical admission and treatment following an overdose on several psychiatric medications, alcohol, cocaine.  She states this was impulsive, unplanned and related to marital stress/husband telling her he was leaving.  States overdose was not suicidal in intent, rather feels that it was " a way of getting to him I guess ". States she feels embarrassed about the overdose, states " I definitely do not want to die, I feel bad I put my family through this". At this time reports she is feeling better, and currently minimizes severe depression or neurovegetative symptoms of depression.  Denies suicidal ideations. Of note, CSW spoke with patient's sister who expressed concern patient was making multiple phone calls to husband from unit, demanding , irritable.  Patient states that she had a good visit from sister yesterday evening, that she is aware she needs to focus on herself and her own improvement.  Categorically denies having made any threats towards her husband and states she is simply working out details as to how the separation will work since she plans to return home at discharge. Identifies love for her family as a protective factor against suicide or hurting herself.  Today has presented visible on unit, focused on being discharged soon, no overtly disruptive or agitated behaviors, noted to be interacting appropriately with peers.  Some group participation. Denies medication side effects.      Principal Problem: Bipolar disorder  ([place]) Diagnosis:   Patient Active Problem List   Diagnosis Date Noted  . Cocaine use disorder, mild, abuse ([place]) [F14.10]   . 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 Bipolar disorder ([place]) [F31.9] 06/18/2018  . Suicide attempt ([place]) [T14.91XA]   . Fatigue [R53.83] 03/01/2018  . Irritability [R45.4] 03/01/2018  . GAD (generalized anxiety disorder) [F41.1] 03/01/2018  . Gingivitis [K05.10] 03/01/2018  . Grinding of teeth [F45.8] 03/01/2018  . Healthcare maintenance [Z00.00] 02/15/2018  . Depression [F32.9] 02/15/2018  . Bipolar 1 disorder ([place]) [F31.9] 02/15/2018  . Cough in adult [R05] 02/15/2018  . Acute bacterial conjunctivitis of right eye [H10.31] 02/15/2018  . Acute respiratory failure ([place]) [J96.00] 01/03/2016  . Acute respiratory failure with hypoxemia ([place]) [J96.01]   . Acute encephalopathy [G93.40]   . Drug overdose [T50.901A] 12/29/2015  . Overdose [T50.901A] 12/29/2015   Total Time spent with patient: 25 minutes   Past Psychiatric History: See admission H&P  Past Medical History:  Past Medical History:  Diagnosis Date  . Allergic rhinitis   . Bipolar 1 disorder ([place])   . Borderline diabetes mellitus   . Colon polyp   . Depression   . Dyspnea on exertion   . Esophageal stricture    scheduled for EGD and dilation 03-18-2016  . GAD (generalized anxiety disorder)   . History of acute respiratory failure    12-29-2015  due to polysubstance overdose--  pt intubated --  resolved   . History of febrile seizure    x1  01/ 2017--  none since  . History of kidney stones    [place]  .  History of suicide attempt    12-29-2015  -- overdose klonopin, xanax, alcohol--  acute respirtory failure and acute encenphalopathy -- both resolved  . IBS (irritable bowel syndrome)   . Irregular menstrual cycle   . Renal calculi    bilateral    Past Surgical History:  Procedure Laterality Date  . COLONOSCOPY    . CYSTOSCOPY WITH STENT PLACEMENT  Bilateral 03/10/2016   Procedure: CYSTOSCOPY WITH STENT PLACEMENT;  Surgeon: Cleon Gustin, MD;  Location[facility];  Service: Urology;  Laterality: Bilateral;  . CYSTOSCOPY/RETROGRADE/URETEROSCOPY/STONE EXTRACTION WITH BASKET Bilateral 03/10/2016   Procedure: CYSTOSCOPY/RETROGRADE/URETEROSCOPY/STONE EXTRACTION WITH BASKET;  Surgeon: Cleon Gustin, MD;  Location: [facility];  Service: Urology;  Laterality: Bilateral;  . EXTRACORPOREAL SHOCK WAVE LITHOTRIPSY  x2    . PERCUTANEOUS NEPHROSTOLITHOTOMY Right 09-18-2005   [place]  . TUBAL LIGATION  1999   Family History:  Family History  Problem Relation Age of Onset  . Cancer Father        pancreas  . Melanoma Father   . Cancer Maternal Grandmother        lung  . COPD Paternal Grandmother   . Melanoma Paternal Grandmother    Family Psychiatric  History: See admission H&P Social History:  Social History   Substance and Sexual Activity  Alcohol Use Yes   Comment: occ     Social History   Substance and Sexual Activity  Drug Use No    Social History   Socioeconomic History  . Marital status: Married    Spouse name: Not on file  . Number of children: Not on file  . Years of education: Not on file  . Highest education level: Not on file  Occupational History  . Not on file  Social Needs  . Financial resource strain: Not on file  . Food insecurity:    Worry: Not on file    Inability: Not on file  . Transportation needs:    Medical: Not on file    Non-medical: Not on file  Tobacco Use  . Smoking status: Current Every Day Smoker    Packs/day: 0.50    Years: 20.00    Pack years: 10.00    Types: Cigarettes  . Smokeless tobacco: Never Used  Substance and Sexual Activity  . Alcohol use: Yes    Comment: occ  . Drug use: No  . Sexual activity: Yes    Birth control/protection: Surgical  Lifestyle  . Physical activity:    Days per week: 0 days    Minutes per session: 0 min  .  Stress: To some extent  Relationships  . Social connections:    Talks on phone: More than three times a week    Gets together: Never    Attends religious service: Never    Active member of club or organization: No    Attends meetings of clubs or organizations: Never    Relationship status: Married  Other Topics Concern  . Not on file  Social History Narrative  . Not on file   Additional Social History:    Pain Medications: see mar Prescriptions: see mar Over the Counter: see mar History of alcohol / drug use?: Yes Longest period of sobriety (when/how long): unkwon Negative Consequences of Use: Legal Withdrawal Symptoms: Other (Comment)(None)  Sleep: Good  Appetite:  Good  Current Medications: Current Facility-Administered Medications  Medication Dose Route Frequency Provider Last Rate Last Dose  . albuterol (PROVENTIL) (2.5 MG/3ML) 0.083% nebulizer solution  2.5 mg  2.5 mg Nebulization Q2H PRN Rankin, Shuvon B, NP      . alum & mag hydroxide-simeth (MAALOX/MYLANTA) 200-200-20 MG/5ML suspension 15 mL  15 mL Oral Q6H PRN Sharma Covert, MD   15 mL at 06/20/18 1453  . buPROPion [facility]) tablet 100 mg  100 mg Oral BID Rankin, Shuvon B, NP   100 mg at 06/23/18 0754  . gabapentin (NEURONTIN) capsule 400 mg  400 mg Oral TID Rankin, Shuvon B, NP   400 mg at 06/23/18 1141  . ibuprofen (ADVIL,MOTRIN) tablet 600 mg  600 mg Oral Q6H PRN Laverle Hobby, PA-C   600 mg at 06/23/18 1207  . nicotine polacrilex (NICORETTE) gum 2 mg  2 mg Oral PRN Cobos, Myer Peer, MD      . pantoprazole (PROTONIX) EC tablet 40 mg  40 mg Oral Daily Sharma Covert, MD   40 mg at 06/23/18 0755  . PARoxetine (PAXIL) tablet 60 mg  60 mg Oral QHS Rankin, Shuvon B, NP   60 mg at 06/22/18 2021  . QUEtiapine (SEROQUEL) tablet 400 mg  400 mg Oral QHS Sharma Covert, MD   400 mg at 06/22/18 2022  . thiamine (VITAMIN B-1) tablet 100 mg  100 mg Oral Daily Money, Lowry Ram, FNP   100 mg at 06/23/18 5993     Lab Results: No results found for this or any previous visit (from the past 48 hour(s)).  Blood Alcohol level:  Lab Results  Component Value Date   ETH 12 (H) 06/14/2018   ETH 117 (H) 57/12/7791    Metabolic Disorder Labs: Lab Results  Component Value Date   HGBA1C 5.5 03/01/2018   No results found for: PROLACTIN Lab Results  Component Value Date   CHOL 204 (H) 03/01/2018   TRIG 258 (H) 03/01/2018   HDL 62 03/01/2018   CHOLHDL 3.3 03/01/2018   LDLCALC 90 03/01/2018    Physical Findings: AIMS: Facial and Oral Movements Muscles of Facial Expression: None, normal Lips and Perioral Area: None, normal Jaw: None, normal Tongue: None, normal,Extremity Movements Upper (arms, wrists, hands, fingers): None, normal Lower (legs, knees, ankles, toes): None, normal, Trunk Movements Neck, shoulders, hips: None, normal, Overall Severity Severity of abnormal movements (highest score from questions above): None, normal Incapacitation due to abnormal movements: None, normal Patient's awareness of abnormal movements (rate only patient's report): No Awareness, Dental Status Current problems with teeth and/or dentures?: No Does patient usually wear dentures?: No  CIWA:  CIWA-Ar Total: 3 COWS:  COWS Total Score: 2  Musculoskeletal: Strength & Muscle Tone: within normal limits Gait & Station: shuffle Patient leans: N/A  Psychiatric Specialty Exam: Physical Exam  Nursing note and vitals reviewed. Constitutional: She is oriented to person, place, and time. She appears well-developed and well-nourished.  HENT:  Head: Normocephalic and atraumatic.  Respiratory: Effort normal.  Neurological: She is alert and oriented to person, place, and time.    Review of Systems  Psychiatric/Behavioral: Positive for substance abuse. Negative for depression, hallucinations, memory loss and suicidal ideas. The patient is not nervous/anxious and does not have insomnia.   All other systems reviewed  and are negative. Denies chest pain, no shortness of breath, no vomiting  Blood pressure 125/79, pulse 79, temperature 98 F (36.7 C), temperature source Oral, resp. rate 16, height _0  (1.651 m), weight 93.9 kg (207 lb), SpO2 100 %.Body mass index is 34.45 kg/m.  General Appearance: Casual  Eye Contact:  Good  Speech:  Normal Rate  Volume:  Normal  Mood:  Reports she is feeling significantly better , currently denies depression  Affect:  Appropriate and Reactive, vaguely irritable  Thought Process:  Linear and Descriptions of Associations: Intact  Orientation:  Other:  Fully alert and attentive  Thought Content:  No hallucinations, no delusions expressed  Suicidal Thoughts:  No-denies any suicidal or self-injurious ideations, denies any homicidal or violent ideations, specifically also denies any homicidal or violent ideations towards her husband  Homicidal Thoughts:  No  Memory:  Recent and remote grossly intact  Judgement:  Fair-improving  Insight:  Fair  Psychomotor Activity:  Normal  Concentration:  Concentration: Good and Attention Span: Good  Recall:  Good  Fund of Knowledge:  Good  Language:  Good  Akathisia:  Negative  Handed:  Right  AIMS (if indicated):     Assets:  Desire for Improvement Housing Physical Health Resilience  ADL's:  Intact  Cognition:  WNL  Sleep:  Number of Hours: 6.25    Assessment -47 year old female with a history of bipolar disorder, admitted June 17 following a severe overdose which required medical admission.  Describes overdose as impulsive/unplanned and in the context of marital discord.  Currently denies suicidal ideations, presents future oriented, minimizes depression or neurovegetative symptoms.  She is tolerating medications well and denies side effects. Based on history would consider gradual titration of mood stabilizer ( Seroquel, which she states has been effective and well tolerated )  and will gradually  taper down antidepressant  dose, simplify medication regimen    ( decrease Paxil dose) Family has reported that patient remains focused on marital stressors, making multiple calls to her husband from unit, loud and demanding on phone at times.  Of note patient denies having made any threats towards her husband, denies any violent or homicidal ideations towards him or anyone else, denies any suicidal ideations, and states her plan is to proceed with separation and focus on herself and her own improvement.  Treatment Plan Summary:  Treatment plan reviewed as below today June 26 Encourage group and milieu participation to work on Radiographer, therapeutic and symptom reduction. Continue Wellbutrin 100 mgrs BID for depression Decrease Paxil to 40 mgrs QDAY for depression, anxiety. Increase Seroquel to 450 mgrs QHS for mood disorder  Continue Neurontin 400 mgrs TID for anxiety, pain Treatment team working on disposition planning options.     Jenne Campus, MD 06/23/2018, 1:54 PM    Patient ID: Mahlon Gammon, female   DOB: 04-24-1971, 47 y.o.   MRN: [id number]

## 2018-06-23 NOTE — Progress Notes (Signed)
Recreation Therapy Notes  Date: 6.26.19 Time: 0930 Location: 300 Hall Dayroom  Group Topic: Stress Management  Goal Area(s) Addresses:  Patient will verbalize importance of using healthy stress management.  Patient will identify positive emotions associated with healthy stress management.   Behavioral Response: Engaged  Intervention: Stress Management  Activity :  Body Scan Meditation.  LRT introduced the stress management technique of meditation.  LRT played meditation to allow patients to take note of any sensations or tensions they may be feeling.  Patients were to follow along as meditation played.  Education:  Stress Management, Discharge Planning.   Education Outcome: Acknowledges edcuation/In group clarification offered/Needs additional education  Clinical Observations/Feedback: Pt attended and participated in group session.     Marjean Imperato, LRT/CTRS         Irvan Tiedt A 06/23/2018 12:02 PM 

## 2018-06-23 NOTE — Progress Notes (Signed)
D: Patient continues to be focused on discharge and her POC was discussed this morning during treatment team.  Patient has been calling her husband and threatening him and also threatening to harm herself.  She made numerous calls today so staff will monitor her usage today.  She denies to staff that she has any SI, however, is endorsing ideation to her spouse.  She minimizes her overdose stating "it was just an impulsive thing I did."  Her affect is flat; she is needy with staff.  A: Continue to monitor medication management and MD orders.  Safety checks completed every 15 minutes per protocol.  Offer support and encouragement as needed.  R: Patient is preoccupied with discharge.

## 2018-06-23 NOTE — [facility] Group Notes (Signed)
[facility] Group Notes:  (Nursing/MHT/Case Management/Adjunct)  Date:  06/23/2018  Time:  4:00 pm  Type of Therapy:  Psychoeducational Skills  Participation Level:  Did Not Attend  Participation Quality:    Affect:    Cognitive:    Insight:    Engagement in Group:    Modes of Intervention:    Summary of Progress/Problems:  Stacie Sanders 06/23/2018, 5:55 PM 

## 2018-06-24 MED ORDER — BUPROPION HCL 100 MG PO TABS
100.0000 mg | ORAL_TABLET | ORAL | Status: DC
  Administered 2018-06-25: 100 mg via ORAL
  Filled 2018-06-24 (×3): qty 1

## 2018-06-24 MED ORDER — QUETIAPINE FUMARATE 400 MG PO TABS
500.0000 mg | ORAL_TABLET | Freq: Every day | ORAL | Status: DC
  Administered 2018-06-24: 500 mg via ORAL
  Filled 2018-06-24 (×3): qty 1

## 2018-06-24 NOTE — [facility] Group Notes (Signed)
[facility] Group Notes:  (Nursing/MHT/Case Management/Adjunct)  Date:  06/24/2018  Time:  4:00 PM  Type of Therapy:  Psychoeducational Skills  Participation Level:  Did Not Attend  Summary of Progress/Problems: Patient was invited but declined to attend group.  Ellouise Mcwhirter A Tiegan Jambor 06/24/2018, 5:00 PM 

## 2018-06-24 NOTE — Progress Notes (Signed)
Patient ID: Stacie Sanders, female   DOB: 07-19-1971, 47 y.o.   MRN: [id number]   D: Patient denies SI/HI and auditory and visual hallucinations. Patient continues to be tearful, sad depressed and anxious. Patient has been on the phone a lot of the time . She did not attend AM group.  A: Patient given emotional support from RN. Patient given medications per MD orders. Patient encouraged to attend groups and unit activities. Patient encouraged to come to staff with any questions or concerns.  R: Patient remains cooperative and appropriate. Will continue to monitor patient for safety.

## 2018-06-24 NOTE — Progress Notes (Signed)
[facility] Group Notes:  (Nursing/MHT/Case Management/Adjunct)  Date:  06/24/2018  Time:  2045  Type of Therapy:  wrap up group  Participation Level:  Active  Participation Quality:  Appropriate, Attentive, Sharing and Supportive  Affect:  Appropriate and Irritable  Cognitive:  Appropriate  Insight:  Improving  Engagement in Group:  Supportive  Modes of Intervention:  Clarification, Education and Support  Summary of Progress/Problems:  Stacie Sanders, Stacie Sanders 06/24/2018, 10:50 PM

## 2018-06-24 NOTE — Progress Notes (Signed)
Pt provided CSW with sister in law's number for collateral contact. [name] [phone]. Pt's sister in law shared that "if she gets out of the hospital now she will either be dead or in trouble." [name] shared that pt is unstable still in her opinion. "She called me earlier and asked me to speak to the doctor there and tell them I think she's ready to discharge. I don't think she's anywhere near ready and I don't want to be responsible for her leaving the hospital and something horrible happening." Pt's friend shared that prior to this overdose, pt overdosed a few years ago "and was in a coma for a month. We didn't think she was gonna pull through." She fears that Stacie Sanders will not come home to a good situation--her husband is requesting a divorce. She feels that Stacie Sanders would benefit from more time in the hospital. "I know she's gonna be mad at me for saying this, but I want her safe and I want what's best for her." CSW thanked Oriole Beachhristy for this information and ensured that it would be shared with Dr. Jama Flavorsobos MD. MD notified of above.   Aljean Horiuchi S. Alan RipperHolloway, MSW, LCSW Clinical Social Worker 06/24/2018 12:53 PM

## 2018-06-24 NOTE — Progress Notes (Addendum)
Pt was observed in the dayroom, attending wrap-up group.  Pt was flat in affect and mood. Pt denies SI/HI/AVH/Pain at this time.Pt was content with Seroquel adjustment to 500 mg this evening. Pt remains somatically focused. PRN vistaril requested and given. Will continue with POC

## 2018-06-24 NOTE — Progress Notes (Signed)
[facility] MD Progress Note  06/24/2018 5:47 PM Stacie Sanders  MRN:  [id number]   Subjective: She endorses improvement and states she feels her mood  " is a lot better". She presents focused on being discharged soon, and states she feels stronger and  more optimistic about being able to live independently and " do all right" in spite of recent stressors ( marital discord, separation).   Objective: I have discussed case with treatment team and have met with patient along with CSW. 47 year old female, history of mood disorder, has been diagnosed with bipolar disorder in the past, required medical admission and treatment following an overdose on several psychiatric medications, alcohol, cocaine.  She states this was impulsive, unplanned and related to marital stress/husband telling her he was leaving Currently patient denies depression and emphasizes she feels " a lot better". She is focused on being discharged soon.She denies any suicidal or self injurious ideations, denies any homicidal or violent ideations, and specifically denies any homicidal or violent ideations towards husband. States " I want to avoid him and focus on me right now".  Of note, staff note indicate patient had been making  phone calls to husband , expressing threats to harm self and him. Patient denies , states she has made phone calls but that she has not made any threats and that purpose of calls is to insure she can return home and avoid him. She provided consent for me to speak with her adult son. He states he feels she is  improving , but expresses concern due to severity of her current stressors ( separation after many years of marriage). CSW has spoken with sister in law ( see CSW note ) who has also expressed concerns.  Patient presents vaguely irritable, labile ,  pacing at times, but no overtly disruptive or agitated behaviors. She denies suicidal ideations. Denies medication side effects.  Principal Problem: Bipolar  disorder ([place]) Diagnosis:   Patient Active Problem List   Diagnosis Date Noted  . Cocaine use disorder, mild, abuse ([place]) [F14.10]   . Alcohol intoxication delirium with mild use disorder ([place]) [F10.121]   . Bipolar disorder ([place]) [F31.9] 06/18/2018  . Suicide attempt ([place]) [T14.91XA]   . Fatigue [R53.83] 03/01/2018  . Irritability [R45.4] 03/01/2018  . GAD (generalized anxiety disorder) [F41.1] 03/01/2018  . Gingivitis [K05.10] 03/01/2018  . Grinding of teeth [F45.8] 03/01/2018  . Healthcare maintenance [Z00.00] 02/15/2018  . Depression [F32.9] 02/15/2018  . Bipolar 1 disorder ([place]) [F31.9] 02/15/2018  . Cough in adult [R05] 02/15/2018  . Acute bacterial conjunctivitis of right eye [H10.31] 02/15/2018  . Acute respiratory failure ([place]) [J96.00] 01/03/2016  . Acute respiratory failure with hypoxemia ([place]) [J96.01]   . Acute encephalopathy [G93.40]   . Drug overdose [T50.901A] 12/29/2015  . Overdose [T50.901A] 12/29/2015   Total Time spent with patient: 25 minutes   Past Psychiatric History: See admission H&P  Past Medical History:  Past Medical History:  Diagnosis Date  . Allergic rhinitis   . Bipolar 1 disorder ([place])   . Borderline diabetes mellitus   . Colon polyp   . Depression   . Dyspnea on exertion   . Esophageal stricture    scheduled for EGD and dilation 03-18-2016  . GAD (generalized anxiety disorder)   . History of acute respiratory failure    12-29-2015  due to polysubstance overdose--  pt intubated --  resolved   . History of febrile seizure    x1  01/ 2017--  none since  .  History of kidney stones    staghorn  . History of suicide attempt    12-29-2015  -- overdose klonopin, xanax, alcohol--  acute respirtory failure and acute encenphalopathy -- both resolved  . IBS (irritable bowel syndrome)   . Irregular menstrual cycle   . Renal calculi    bilateral    Past Surgical History:  Procedure Laterality Date  . COLONOSCOPY    . CYSTOSCOPY WITH STENT  PLACEMENT Bilateral 03/10/2016   Procedure: CYSTOSCOPY WITH STENT PLACEMENT;  Surgeon: Cleon Gustin, MD;  Location: [facility];  Service: Urology;  Laterality: Bilateral;  . CYSTOSCOPY/RETROGRADE/URETEROSCOPY/STONE EXTRACTION WITH BASKET Bilateral 03/10/2016   Procedure: CYSTOSCOPY/RETROGRADE/URETEROSCOPY/STONE EXTRACTION WITH BASKET;  Surgeon: Cleon Gustin, MD;  Location: [facility];  Service: Urology;  Laterality: Bilateral;  . EXTRACORPOREAL SHOCK WAVE LITHOTRIPSY  x2    . PERCUTANEOUS NEPHROSTOLITHOTOMY Right 09-18-2005   staghorn  . TUBAL LIGATION  1999   Family History:  Family History  Problem Relation Age of Onset  . Cancer Father        pancreas  . Melanoma Father   . Cancer Maternal Grandmother        lung  . COPD Paternal Grandmother   . Melanoma Paternal Grandmother    Family Psychiatric  History: See admission H&P Social History:  Social History   Substance and Sexual Activity  Alcohol Use Yes   Comment: occ     Social History   Substance and Sexual Activity  Drug Use No    Social History   Socioeconomic History  . Marital status: Married    Spouse name: Not on file  . Number of children: Not on file  . Years of education: Not on file  . Highest education level: Not on file  Occupational History  . Not on file  Social Needs  . Financial resource strain: Not on file  . Food insecurity:    Worry: Not on file    Inability: Not on file  . Transportation needs:    Medical: Not on file    Non-medical: Not on file  Tobacco Use  . Smoking status: Current Every Day Smoker    Packs/day: 0.50    Years: 20.00    Pack years: 10.00    Types: Cigarettes  . Smokeless tobacco: Never Used  Substance and Sexual Activity  . Alcohol use: Yes    Comment: occ  . Drug use: No  . Sexual activity: Yes    Birth control/protection: Surgical  Lifestyle  . Physical activity:    Days per week: 0 days    Minutes per session: 0  min  . Stress: To some extent  Relationships  . Social connections:    Talks on phone: More than three times a week    Gets together: Never    Attends religious service: Never    Active member of club or organization: No    Attends meetings of clubs or organizations: Never    Relationship status: Married  Other Topics Concern  . Not on file  Social History Narrative  . Not on file   Additional Social History:    Pain Medications: see mar Prescriptions: see mar Over the Counter: see mar History of alcohol / drug use?: Yes Longest period of sobriety (when/how long): unkwon Negative Consequences of Use: Legal Withdrawal Symptoms: Other (Comment)(None)  Sleep: Fair  Appetite:  Good  Current Medications: Current Facility-Administered Medications  Medication Dose Route Frequency Provider Last Rate Last  Dose  . albuterol (PROVENTIL) (2.5 MG/3ML) 0.083% nebulizer solution 2.5 mg  2.5 mg Nebulization Q2H PRN Rankin, Shuvon B, NP      . alum & mag hydroxide-simeth (MAALOX/MYLANTA) 200-200-20 MG/5ML suspension 15 mL  15 mL Oral Q6H PRN Sharma Covert, MD   15 mL at 06/20/18 1453  . buPROPion [facility]) tablet 100 mg  100 mg Oral BID Rankin, Shuvon B, NP   100 mg at 06/24/18 1715  . gabapentin (NEURONTIN) capsule 400 mg  400 mg Oral TID Rankin, Shuvon B, NP   400 mg at 06/24/18 1715  . hydrOXYzine (ATARAX/VISTARIL) tablet 25 mg  25 mg Oral Q6H PRN Chattie Greeson, Myer Peer, MD   25 mg at 06/24/18 1102  . ibuprofen (ADVIL,MOTRIN) tablet 600 mg  600 mg Oral Q6H PRN Laverle Hobby, PA-C   600 mg at 06/23/18 1207  . nicotine polacrilex (NICORETTE) gum 2 mg  2 mg Oral PRN Ethelene Closser, Myer Peer, MD   2 mg at 06/24/18 1103  . pantoprazole (PROTONIX) EC tablet 40 mg  40 mg Oral Daily Sharma Covert, MD   40 mg at 06/24/18 0847  . PARoxetine (PAXIL) tablet 20 mg  20 mg Oral QHS Rigdon Macomber, Myer Peer, MD   20 mg at 06/23/18 2129  . QUEtiapine (SEROQUEL) tablet 450 mg  450 mg Oral QHS Bathsheba Durrett, Myer Peer, MD   450 mg at 06/23/18 2129  . thiamine (VITAMIN B-1) tablet 100 mg  100 mg Oral Daily Money, Lowry Ram, FNP   100 mg at 06/24/18 0848    Lab Results: No results found for this or any previous visit (from the past 48 hour(s)).  Blood Alcohol level:  Lab Results  Component Value Date   ETH 12 (H) 06/14/2018   ETH 117 (H) 67/89/3810    Metabolic Disorder Labs: Lab Results  Component Value Date   HGBA1C 5.5 03/01/2018   No results found for: PROLACTIN Lab Results  Component Value Date   CHOL 204 (H) 03/01/2018   TRIG 258 (H) 03/01/2018   HDL 62 03/01/2018   CHOLHDL 3.3 03/01/2018   LDLCALC 90 03/01/2018    Physical Findings: AIMS: Facial and Oral Movements Muscles of Facial Expression: None, normal Lips and Perioral Area: None, normal Jaw: None, normal Tongue: None, normal,Extremity Movements Upper (arms, wrists, hands, fingers): None, normal Lower (legs, knees, ankles, toes): None, normal, Trunk Movements Neck, shoulders, hips: None, normal, Overall Severity Severity of abnormal movements (highest score from questions above): None, normal Incapacitation due to abnormal movements: None, normal Patient's awareness of abnormal movements (rate only patient's report): No Awareness, Dental Status Current problems with teeth and/or dentures?: No Does patient usually wear dentures?: No  CIWA:  CIWA-Ar Total: 3 COWS:  COWS Total Score: 2  Musculoskeletal: Strength & Muscle Tone: within normal limits Gait & Station: shuffle Patient leans: N/A  Psychiatric Specialty Exam: Physical Exam  Nursing note and vitals reviewed. Constitutional: She is oriented to person, place, and time. She appears well-developed and well-nourished.  HENT:  Head: Normocephalic and atraumatic.  Respiratory: Effort normal.  Neurological: She is alert and oriented to person, place, and time.    Review of Systems  Psychiatric/Behavioral: Positive for substance abuse. Negative for depression,  hallucinations, memory loss and suicidal ideas. The patient is not nervous/anxious and does not have insomnia.   All other systems reviewed and are negative. Denies chest pain, no shortness of breath, no vomiting  Blood pressure 118/63, pulse 89, temperature 98.4 F (36.9  C), temperature source Oral, resp. rate 16, height '5\' 5"'$  (1.651 m), weight 93.9 kg (207 lb), SpO2 100 %.Body mass index is 34.45 kg/m.  General Appearance: Casual  Eye Contact:  Good  Speech:  Normal Rate  Volume:  Normal  Mood:  endorses improved mood, denies depression at this time  Affect:  some irritability, labile at times  Thought Process:  Linear and Descriptions of Associations: Intact- no flight of ideations  Orientation:  Other:  Fully alert and attentive  Thought Content:  No hallucinations, no delusions expressed  Suicidal Thoughts:  No-she continues to deny  any suicidal or self-injurious ideations, denies any homicidal or violent ideations, specifically also denies any homicidal or violent ideations towards her husband  Homicidal Thoughts:  No  Memory:  Recent and remote grossly intact  Judgement:  Fair-improving  Insight:  Fair  Psychomotor Activity:  Normal  Concentration:  Concentration: Good and Attention Span: Good  Recall:  Good  Fund of Knowledge:  Good  Language:  Good  Akathisia:  Negative  Handed:  Right  AIMS (if indicated):     Assets:  Desire for Improvement Housing Physical Health Resilience  ADL's:  Intact  Cognition:  WNL  Sleep:  Number of Hours: 5    Assessment - patient reports significant improvement of symptoms and states she feels ready for discharge. She denies suicidal or self injurious ideations, denies homicidal or violent ideations, and specifically denies homicidal ideations towards husband ( marital discord, separation is current major stressor). Staff reports emotional lability, episodes of tearfulness, and making threatening phone calls to her husband, which she  categorically denies .  Family members endorse improvement , but express concern regarding current stability in the context of her significant psychosocial stressors. She is currently tolerating medications well.   Treatment Plan Summary:  Treatment plan reviewed as below today June 26 Encourage group and milieu participation to work on Radiographer, therapeutic and symptom reduction. Decrease Wellbutrin to 100 mgrs QAM for depression Decrease Paxil to 20  mgrs QDAY for depression, anxiety. Increase Seroquel to 500  mgrs QHS for mood disorder  Continue Neurontin 400 mgrs TID for anxiety, pain Treatment team working on disposition planning options.If patient consents , will work on arranging for a family meeting.      Jenne Campus, MD 06/24/2018, 5:47 PM    Patient ID: Stacie Sanders, female   DOB: 06-21-1971, 47 y.o.   MRN: [id number]

## 2018-06-24 NOTE — Progress Notes (Signed)
Patient ID: Stacie Sanders, female   DOB: 11-30-1971, 47 y.o.   MRN: [id number]  Pt did not attend orientation/ goals group.

## 2018-06-24 NOTE — Progress Notes (Signed)
Pt has been very anxious and tearful this evening, pacing the hall.  She is overwhelmed at the thought of going back to her home and her husband not being there.  He left her 3 weeks ago for another woman.  She also found out that her husband has introduced their children to this new woman and she is distraught that they will prefer the other woman to her.  She says her children are upset with her because of her overdose.  She says that she cannot imagine being single and having to take care of herself.  Writer spent extended time with pt, letting her process her situation and encouraging her to speak with the CSW for resources that are available to her to get the financial and emotional help she will need to get herself through this present crisis.  Writer also encouraged her to find a support group and not isolate herself.  Pt denies SI/HI/AVH at this time, but previous RN reported that pt had been calling her estranged husband and making threats towards him and the other woman.  Support and encouragement offered.  Discharge plans are in process.  Pt is taking her meds and utilizing prn meds as needed.  Safety maintained with q15 minute checks.

## 2018-06-25 LAB — BASIC METABOLIC PANEL
Anion gap: 10 (ref 5–15)
BUN: 12 mg/dL (ref 6–20)
CALCIUM: 9.8 mg/dL (ref 8.9–10.3)
CO2: 28 mmol/L (ref 22–32)
CREATININE: 1.01 mg/dL — AB (ref 0.44–1.00)
Chloride: 105 mmol/L (ref 98–111)
GFR calc non Af Amer: >60 mL/min (ref >60)
Glucose, Bld: 106 mg/dL — ABNORMAL HIGH (ref 70–99)
Potassium: 4 mmol/L (ref 3.5–5.1)
SODIUM: 143 mmol/L (ref 135–145)

## 2018-06-25 LAB — CBC WITH DIFFERENTIAL/PLATELET
BASOS ABS: 0 10*3/uL (ref 0.0–0.1)
BASOS PCT: 0 %
EOS ABS: 0.2 10*3/uL (ref 0.0–0.7)
Eosinophils Relative: 2 %
HEMATOCRIT: 42.8 % (ref 36.0–46.0)
HEMOGLOBIN: 13.8 g/dL (ref 12.0–15.0)
Lymphocytes Relative: 35 %
Lymphs Abs: 3.3 10*3/uL (ref 0.7–4.0)
MCH: 30.8 pg (ref 26.0–34.0)
MCHC: 32.2 g/dL (ref 30.0–36.0)
MCV: 95.5 fL (ref 78.0–100.0)
Monocytes Absolute: 0.6 10*3/uL (ref 0.1–1.0)
Monocytes Relative: 7 %
NEUTROS PCT: 56 %
Neutro Abs: 5.2 10*3/uL (ref 1.7–7.7)
PLATELETS: 390 10*3/uL (ref 150–400)
RBC: 4.48 MIL/uL (ref 3.87–5.11)
RDW: 14.4 % (ref 11.5–15.5)
WBC: 9.3 10*3/uL (ref 4.0–10.5)

## 2018-06-25 LAB — LIPID PANEL
Cholesterol: 254 mg/dL — ABNORMAL HIGH (ref 0–200)
HDL: 61 mg/dL (ref >40)
LDL Cholesterol: 146 mg/dL — ABNORMAL HIGH (ref 0–99)
TRIGLYCERIDES: 233 mg/dL — AB (ref <150)
Total CHOL/HDL Ratio: 4.2 RATIO
VLDL: 47 mg/dL — ABNORMAL HIGH (ref 0–40)

## 2018-06-25 LAB — HEMOGLOBIN A1C
HEMOGLOBIN A1C: 5.6 % (ref 4.8–5.6)
Mean Plasma Glucose: 114.02 mg/dL

## 2018-06-25 MED ORDER — GABAPENTIN 400 MG PO CAPS: 400.0000 mg | ORAL_CAPSULE | Freq: Three times a day (TID) | ORAL | 0 refills | Status: DC

## 2018-06-25 MED ORDER — BUPROPION HCL 100 MG PO TABS: 100.0000 mg | ORAL_TABLET | ORAL | 0 refills | Status: DC

## 2018-06-25 MED ORDER — HYDROXYZINE HCL 25 MG PO TABS: 25.0000 mg | ORAL_TABLET | Freq: Four times a day (QID) | ORAL | 0 refills | Status: DC | PRN

## 2018-06-25 MED ORDER — NICOTINE POLACRILEX 2 MG MT GUM: 2.0000 mg | CHEWING_GUM | OROMUCOSAL | 0 refills | Status: DC | PRN

## 2018-06-25 MED ORDER — QUETIAPINE FUMARATE 200 MG PO TABS: 500.0000 mg | ORAL_TABLET | Freq: Every day | ORAL | 0 refills | Status: DC

## 2018-06-25 MED ORDER — QUETIAPINE FUMARATE 200 MG PO TABS
500.0000 mg | ORAL_TABLET | Freq: Every day | ORAL | Status: DC
  Filled 2018-06-25: qty 18

## 2018-06-25 MED ORDER — PAROXETINE HCL 20 MG PO TABS: 20.0000 mg | ORAL_TABLET | Freq: Every day | ORAL | 0 refills | Status: DC

## 2018-06-25 NOTE — Progress Notes (Signed)
Pt states Seroquel 500 was effective. Pt state she slept good.

## 2018-06-25 NOTE — Progress Notes (Signed)
Recreation Therapy Notes  Date: 6.28.19 Time: 0930 Location: 300 Hall Dayroom  Group Topic: Stress Management  Goal Area(s) Addresses:  Patient will verbalize importance of using healthy stress management.  Patient will identify positive emotions associated with healthy stress management.   Behavioral Response: Engaged  Intervention: Stress Management  Activity :  Mountain Meditation.  LRT introduced the stress management technique of meditation.  LRT read a script on how mountains stay the same in spite of the things that go on around them.  Education:  Stress Management, Discharge Planning.   Education Outcome: Acknowledges edcuation/In group clarification offered/Needs additional education  Clinical Observations/Feedback: Pt attended and participated in group.    Elena Davia, LRT/CTRS         Eufemio Strahm A 06/25/2018 12:11 PM 

## 2018-06-25 NOTE — Progress Notes (Signed)
Discharge note: Patient discharged home per MD order.  Reviewed AVS/transition record with patient and she indicated understanding.  [facility].  Patient denies any thoughts of self harm.  She left with prescriptions and samples of her medications.  Patient left ambulatory with her son.

## 2018-06-25 NOTE — Progress Notes (Signed)
  [facility] :  Will you be returning to the same living situation after discharge:  Yes,  patient reports she is returning home with her son At discharge, do you have transportation home?: Yes,  patient reports her sister or oldest son will pick her up at discharge Do you have the ability to pay for your medications: No.  Release of information consent forms completed and in the chart;  Patient's signature needed at discharge.  Patient to Follow up at: Follow-up Information    [facility] Follow up on 06/25/2018.   Why:  Appointment on Friday, 6/28 at 8:45AM with Merlyn AlbertFred. Please bring medication list/hospital discharge paperwork. If you are interested in therapy, request this from RedfordFred at this appt. Thank you.   Contact information: [address] [place] [id number] [phone]           Next level of care provider has access [facility] Link:yes  Safety Planning and Suicide Prevention discussed: Yes,  with the patient's sister  Have you used any form of tobacco in the last 30 days? (Cigarettes, Smokeless Tobacco, Cigars, and/or Pipes): Yes  Has patient been referred to the Quitline?: Patient refused referral  Patient has been referred for addiction treatment: Pt. refused referral  Maeola SarahJolan E Shaylea Ucci, LCSWA 06/25/2018, 11:18 AM

## 2018-06-25 NOTE — Discharge Summary (Addendum)
Physician Discharge Summary Note  Patient:  Stacie Sanders is an 47 y.o., female MRN:  [id number] DOB:  09/17/1971 Patient phone:  [phone] (home)  Patient address:   [address] [place] [address],  Total Time spent with patient: 30 minutes  Date of Admission:  06/18/2018 Date of Discharge: 06/25/2018  Reason for Admission:  Pt arrived via EMS from home fnd sleeping on the couch. Pt told family that she was going to take pills to kill herself. On scene pt denied. Pt was able to ambulate to ambulance however upon arrival pt is not responsive to painful stimuli. Empty medication bottles of the following were fnd at scene Buspirone, Neurontin, Paxil, seroquel, Vistaril and Ethol ( beer bottles noted at scene) Pt was placed in C-collar by EMS to maintain Awy, nasal trumpet in place ad pt placed on 4 lpm of oxygen.  06/14/18 ED provider Note: Stacie Sanders a 47 y.o.femalewith a history of bipolar disorder, depression, and prior suicide attempt who presents with suicide attempt and intentional overdose. According to EMS report to nursing, patient called family saying she was going to kill herself and then was found minimally responsive on the couch with empty pill bottles. Patient was initially walking and talking with EMS however during transport she had decreased mental status. On my initial evaluation, patient was found to have a GCS of 3. Patient had very noisy breathing with gurgling raising concern for aspiration.  On my exam, patient was unresponsive to painful stimuli. Given the concern for airway protection, patient was intubated. Patient's lungs were coarse bilaterally and patient had a symmetric and sluggish pupils prior to intubation. CT was obtained given the pupil asymmetry and her altered mental status. There was minimal concern for trauma as she was on a couch however cannot explain her exam based on the medications at this time. After intubation, critical care  was called to admit the patient. They will admit. They involve neurology who requested further CT scans and likely EEG monitoring. Patient was transferred and admitted to the ICU at [facility] for further management.  06/18/18 [facility] RN Note: Stacie Sanders a 47 y.o.femaleInvolutarlyadmitted for [facility] attemptfrom [facility].Pt stated she used to having her medications in small packets, but the doctor changed the package to one large package. Pt stated she got confused and took more than scheduled thorough out the day. Pt denied it was an attempt to kill herself. Pt also complained of being told at the Ed that he was just coming to see the doctor and be released the same day. She could not understand why they lied to her. Pt A & O x 4. Denied SI/HI AVH at the time of admission.Consents signed, skin/belongings search completed and pt oriented to unit. Pt stable at this time.  On evaluation today: Patient is seen by me today face-to-face.  Patient begins in her interview by stating that she did not take any overdose in an attempt to kill herself and she was unsure of who was involuntarily committed her.  She denies any substance abuse.  She denies any chronic alcohol use, but admits to occasional alcohol use when she goes out with her friends.  She continues repeating that he has she knows her husband has cheated on her and they are on again off again frequently.  The last time that she caught him cheating on her was 3 weeks ago and now she states that her entire world is turning upside down because he said he  is done and he wants a divorce now.  She also reports that she deals with a lot of depression from her grandson dying in her house in 2016 and she was hospitalized then for an intentional overdose.  She denies any suicide attempt this time and states that it was accidental due to the new packaging of her medications.  She states that she sees [facility] at [facility] and she has regular appointments.   However, patient is unclear about medications there are changes listed through the chart that do not add up.  Patient was also positive for benzodiazepines and cocaine.  Per report of the IVC, when EMS arrived at her house there was several pill bottles lying around as well as several empty beer bottles lying around.  Patient is very adamant about being discharged today and wants to go home and keeps using the excuse of her family as a reason that she needs to go.  However, and report is states that her family were the ones who called EMS when she reported threatening to kill herself.  Patient is very guarded about specifics and continues to deny any of the reported incidents as far as suicide attempt, overdose, substance abuse. I do suspect patient is lying to be discharged form the hospital, even with admitting to her numerous riss factors.    Associated Signs/Symptoms: Depression Symptoms:  depressed mood, anhedonia, feelings of worthlessness/guilt, difficulty concentrating, impaired memory, loss of energy/fatigue, weight loss, decreased appetite, (Hypo) Manic Symptoms:  Flight of Ideas, Licensed conveyancerinancial Extravagance, Irritable Mood, Anxiety Symptoms:  Excessive Worry, Psychotic Symptoms:  Denies PTSD Symptoms: Had a traumatic exposure:  grandson died in her house a few years ago  Principal Problem: Bipolar disorder [facility](HCC) Discharge Diagnoses: Patient Active Problem List   Diagnosis Date Noted  . Cocaine use disorder, mild, abuse (HCC) [F14.10]   . 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 Bipolar disorder (HCC) [F31.9] 06/18/2018  . Suicide attempt (HCC) [T14.91XA]   . Fatigue [R53.83] 03/01/2018  . Irritability [R45.4] 03/01/2018  . GAD (generalized anxiety disorder) [F41.1] 03/01/2018  . Gingivitis [K05.10] 03/01/2018  . Grinding of teeth [F45.8] 03/01/2018  . Healthcare maintenance [Z00.00] 02/15/2018  . Depression [F32.9] 02/15/2018  . Bipolar 1  disorder (HCC) [F31.9] 02/15/2018  . Cough in adult [R05] 02/15/2018  . Acute bacterial conjunctivitis of right eye [H10.31] 02/15/2018  . Acute respiratory failure (HCC) [J96.00] 01/03/2016  . Acute respiratory failure with hypoxemia (HCC) [J96.01]   . Acute encephalopathy [G93.40]   . Drug overdose [T50.901A] 12/29/2015  . Overdose [T50.901A] 12/29/2015    Past Medical History:  Past Medical History:  Diagnosis Date  . Allergic rhinitis   . Bipolar 1 disorder (HCC)   . Borderline diabetes mellitus   . Colon polyp   . Depression   . Dyspnea on exertion   . Esophageal stricture    scheduled for EGD and dilation 03-18-2016  . GAD (generalized anxiety disorder)   . History of acute respiratory failure    12-29-2015  due to polysubstance overdose--  pt intubated --  resolved   . History of febrile seizure    x1  01/ 2017--  none since  . History of kidney stones    staghorn  . History of suicide attempt    12-29-2015  -- overdose klonopin, xanax, alcohol--  acute respirtory failure and acute encenphalopathy -- both resolved  . IBS (irritable bowel syndrome)   . Irregular menstrual cycle   . Renal  calculi    bilateral    Past Surgical History:  Procedure Laterality Date  . COLONOSCOPY    . CYSTOSCOPY WITH STENT PLACEMENT Bilateral 03/10/2016   Procedure: CYSTOSCOPY WITH STENT PLACEMENT;  Surgeon: Malen Gauze, MD;  Location[facility];  Service: Urology;  Laterality: Bilateral;  . CYSTOSCOPY/RETROGRADE/URETEROSCOPY/STONE EXTRACTION WITH BASKET Bilateral 03/10/2016   Procedure: CYSTOSCOPY/RETROGRADE/URETEROSCOPY/STONE EXTRACTION WITH BASKET;  Surgeon: Malen Gauze, MD;  Location: [facility];  Service: Urology;  Laterality: Bilateral;  . EXTRACORPOREAL SHOCK WAVE LITHOTRIPSY  x2    . PERCUTANEOUS NEPHROSTOLITHOTOMY Right 09-18-2005   staghorn  . TUBAL LIGATION  1999   Family History:  Family History  Problem Relation Age of Onset   . Cancer Father        pancreas  . Melanoma Father   . Cancer Maternal Grandmother        lung  . COPD Paternal Grandmother   . Melanoma Paternal Grandmother    Family Psychiatric  History: Dad - schizophrenia  Social History:  Social History   Substance and Sexual Activity  Alcohol Use Yes   Comment: occ     Social History   Substance and Sexual Activity  Drug Use No    Social History   Socioeconomic History  . Marital status: Married    Spouse name: Not on file  . Number of children: Not on file  . Years of education: Not on file  . Highest education level: Not on file  Occupational History  . Not on file  Social Needs  . Financial resource strain: Not on file  . Food insecurity:    Worry: Not on file    Inability: Not on file  . Transportation needs:    Medical: Not on file    Non-medical: Not on file  Tobacco Use  . Smoking status: Current Every Day Smoker    Packs/day: 0.50    Years: 20.00    Pack years: 10.00    Types: Cigarettes  . Smokeless tobacco: Never Used  Substance and Sexual Activity  . Alcohol use: Yes    Comment: occ  . Drug use: No  . Sexual activity: Yes    Birth control/protection: Surgical  Lifestyle  . Physical activity:    Days per week: 0 days    Minutes per session: 0 min  . Stress: To some extent  Relationships  . Social connections:    Talks on phone: More than three times a week    Gets together: Never    Attends religious service: Never    Active member of club or organization: No    Attends meetings of clubs or organizations: Never    Relationship status: Married  Other Topics Concern  . Not on file  Social History Narrative  . Not on file    Hospital Course: Stacie Sanders was admitted for Bipolar disorder [facility]) and crisis management.  She was treated with the following medications quetiapine 500 p.o. nightly, fluoxetine 20 mg p.o. nightly, bupropion 100 mg p.o. every morning, gabapentin 400 mg p.o. 3 times  daily, and hydroxyzine 25 mg p.o. q6 as needed.Stacie Sanders was discharged with current medication and was instructed on how to take medications as prescribed; (details listed below under Medication List).  Medical problems were identified and treated as needed.  Home medications were restarted as appropriate.  Labs on admission were elevated however repeat labs were obtained with improved values to include liver panel.  Lipid panel elevated in LDL 146, and Triglycerides.  Hemoglobin A1c 5.6.   Improvement was monitored by observation and Stacie Sanders daily report of symptom reduction.  Emotional and mental status was monitored by daily self-inventory reports completed by Stacie Sanders and clinical staff.         Stacie Sanders was evaluated by the treatment team for stability and plans for continued recovery upon discharge.  Stacie Sanders motivation was an integral factor for scheduling further treatment.  Employment, transportation, bed availability, health status, family support, and any pending legal issues were also considered during her hospital stay.  She was offered further treatment options upon discharge including but not limited to Residential, Intensive Outpatient, and Outpatient treatment.  Stacie Sanders will follow up with the services as listed below under Follow Up Information.     Upon completion of this admission the Stacie Sanders was both mentally and medically stable for discharge denying suicidal/homicidal ideation, auditory/visual/tactile hallucinations, delusional thoughts and paranoia.       Physical Findings: AIMS: Facial and Oral Movements Muscles of Facial Expression: None, normal Lips and Perioral Area: None, normal Jaw: None, normal Tongue: None, normal,Extremity Movements Upper (arms, wrists, hands, fingers): None, normal Lower (legs, knees, ankles, toes): None, normal, Trunk Movements Neck, shoulders, hips: None, normal, Overall  Severity Severity of abnormal movements (highest score from questions above): None, normal Incapacitation due to abnormal movements: None, normal Patient's awareness of abnormal movements (rate only patient's report): No Awareness, Dental Status Current problems with teeth and/or dentures?: No Does patient usually wear dentures?: No  CIWA:  CIWA-Ar Total: 3 COWS:  COWS Total Score: 2  Musculoskeletal: Strength & Muscle Tone: within normal limits Gait & Station: normal Patient leans: N/A  Psychiatric Specialty Exam: Physical Exam  ROS  Blood pressure 105/76, pulse 89, temperature 98.4 F (36.9 C), temperature source Oral, resp. rate 16, height 5\' 5"  (1.651 m), weight 93.9 kg (207 lb), SpO2 100 %.Body mass index is 34.45 kg/m.  Sleep:  Number of Hours: 5.75     Have you used any form of tobacco in the last 30 days? (Cigarettes, Smokeless Tobacco, Cigars, and/or Pipes): Yes  Has this patient used any form of tobacco in the last 30 days? (Cigarettes, Smokeless Tobacco, Cigars, and/or Pipes) Yes, prescription provided at discharge.  Blood Alcohol level:  Lab Results  Component Value Date   ETH 12 (H) 06/14/2018   ETH 117 (H) 06/14/2018    Metabolic Disorder Labs:  Lab Results  Component Value Date   HGBA1C 5.6 06/25/2018   MPG 114.02 06/25/2018   No results found for: PROLACTIN Lab Results  Component Value Date   CHOL 254 (H) 06/25/2018   TRIG 233 (H) 06/25/2018   HDL 61 06/25/2018   CHOLHDL 4.2 06/25/2018   VLDL 47 (H) 06/25/2018   LDLCALC 146 (H) 06/25/2018   LDLCALC 90 03/01/2018    See Psychiatric Specialty Exam and Suicide Risk Assessment completed by Attending Physician prior to discharge.  Discharge destination:  Home  Is patient on multiple antipsychotic therapies at discharge:  No   Has Patient had three or more failed trials of antipsychotic monotherapy by history:  No  Recommended Plan for Multiple Antipsychotic Therapies: NA  Discharge Instructions     Discharge instructions   Complete by:  As directed    Please continue to take medications as directed. If your symptoms return, worsen, or persist please call your 911, report to local ER, or contact  crisis hotline. Please do not drink alcohol or use any illegal substances while taking prescription medications.     Allergies as of 06/25/2018      Reactions   Fentanyl Hives, Itching   Percocet [oxycodone-acetaminophen] Hives, Itching   Robaxin [methocarbamol] Rash      Medication List    STOP taking these medications   amoxicillin-clavulanate 875-125 MG tablet Commonly known as:  AUGMENTIN   OLANZapine 2.5 MG tablet Commonly known as:  ZYPREXA   thiamine 100 MG/ML injection Commonly known as:  B-1     TAKE these medications     Indication  albuterol (2.5 MG/3ML) 0.083% nebulizer solution Commonly known as:  PROVENTIL Take 3 mLs (2.5 mg total) by nebulization every 2 (two) hours as needed for wheezing.  Indication:  Acute Bronchospastic Disease   buPROPion 100 MG tablet Commonly known as:  WELLBUTRIN Take 1 tablet (100 mg total) by mouth every morning. Start taking on:  06/26/2018  Indication:  Major Depressive Disorder   famotidine 20 MG tablet Commonly known as:  PEPCID Take 1 tablet (20 mg total) by mouth daily.  Indication:  Heartburn   gabapentin 400 MG capsule Commonly known as:  NEURONTIN Take 1 capsule (400 mg total) by mouth 3 (three) times daily.  Indication:  Social Anxiety Disorder   hydrOXYzine 25 MG tablet Commonly known as:  ATARAX/VISTARIL Take 1 tablet (25 mg total) by mouth every 6 (six) hours as needed for anxiety.  Indication:  Feeling Anxious   nicotine polacrilex 2 MG gum Commonly known as:  NICORETTE Take 1 each (2 mg total) by mouth as needed for smoking cessation.  Indication:  Nicotine Addiction   PARoxetine 20 MG tablet Commonly known as:  PAXIL Take 1 tablet (20 mg total) by mouth at bedtime.  Indication:  Depressive Phase of  Manic-Depression   QUEtiapine 200 MG tablet Commonly known as:  SEROQUEL Take 2.5 tablets (500 mg total) by mouth at bedtime.  Indication:  Manic Phase of Manic-Depression      Follow-up Information    [facility] Follow up on 06/25/2018.   Why:  Appointment on Friday, 6/28 at 8:45AM with Merlyn Albert. Please bring medication list/hospital discharge paperwork. If you are interested in therapy, request this from [facility] at this appt. Thank you.   Contact information: [address] [place] [address] [phone]           Follow-up recommendations:  Activity:  Increase activity as tolerated. Diet:  Routine house diet as discussed by outpatient physician. Tests:  Routine test as suggested by outpatient physician.  Please see above. Other:  Even if you began to feel better please continue to take your medication unless further directed by outpatient psychiatrist.   Signed: Truman Hayward, FNP 06/25/2018, 12:26 PM   Patient seen, Suicide Assessment Completed.  Disposition Plan Reviewed

## 2018-06-25 NOTE — Tx Team (Signed)
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 albuterol (PROVENTIL) (2.5 MG/3ML) 0.083% nebulizer solution 2.5 mg  2.5 mg Nebulization Q2H PRN Rankin, Shuvon B, NP      . alum & mag hydroxide-simeth (MAALOX/MYLANTA) 200-200-20 MG/5ML suspension 15 mL  15 mL Oral Q6H PRN Antonieta Pertlary, Greg Lawson, MD   15 mL at 06/20/18 1453  . buPROPion [facility](WELLBUTRIN) tablet 100 mg  100 mg Oral BH-q7a Cobos, Rockey SituFernando A, MD   100 mg at 06/25/18 [id number]  . gabapentin (NEURONTIN) capsule 400 mg  400 mg Oral TID Cobos, Rockey SituFernando A, MD   400 mg at 06/25/18 1212  . hydrOXYzine (ATARAX/VISTARIL) tablet 25 mg  25 mg Oral Q6H PRN Cobos, Rockey SituFernando A, MD   25 mg at 06/24/18 2101  . ibuprofen (ADVIL,MOTRIN) tablet 600 mg  600 mg Oral Q6H PRN Kerry HoughSimon, Spencer E, PA-C   600 mg at 06/25/18 [id number]  . nicotine polacrilex (NICORETTE) gum 2 mg  2 mg Oral PRN Cobos, Rockey SituFernando A, MD   2 mg at 06/25/18 1513  . pantoprazole (PROTONIX) EC tablet 40 mg  40 mg Oral Daily Antonieta Pertlary, Greg Lawson, MD   40 mg at 06/25/18 0754  . PARoxetine (PAXIL) tablet 20 mg  20 mg Oral QHS Cobos, Rockey SituFernando A, MD   20 mg at 06/24/18 2101  . QUEtiapine (SEROQUEL) tablet 500 mg  500 mg Oral QHS Cobos, Rockey SituFernando A, MD   500 mg at 06/24/18 2101  . thiamine (VITAMIN B-1) tablet 100 mg  100 mg Oral Daily Money, Gerlene Burdockravis B, FNP   100 mg at 06/25/18 [id number]   Current Outpatient Medications  Medication Sig Dispense Refill  . albuterol (PROVENTIL) (2.5 MG/3ML) 0.083% nebulizer solution Take 3 mLs (2.5 mg total) by nebulization every 2 (two) hours as  needed for wheezing. 75 mL 12  . [START ON 06/26/2018] buPROPion (WELLBUTRIN) 100 MG tablet Take 1 tablet (100 mg total) by mouth every morning. 30 tablet 0  . famotidine (PEPCID) 20 MG tablet Take 1 tablet (20 mg total) by mouth daily.    Marland Kitchen. gabapentin (NEURONTIN) 400 MG capsule Take 1 capsule (400 mg total) by mouth 3 (three) times daily. 90 capsule 0  . hydrOXYzine (ATARAX/VISTARIL) 25 MG tablet Take 1 tablet (25 mg total) by mouth every 6 (six) hours as needed for anxiety. 30 tablet 0  . nicotine polacrilex (NICORETTE) 2 MG gum Take 1 each (2 mg total) by mouth as needed for smoking cessation. 100 tablet 0  . PARoxetine (PAXIL) 20 MG tablet Take 1 tablet (20 mg total) by mouth at bedtime. 30 tablet 0  . QUEtiapine (SEROQUEL) 200 MG tablet Take 2.5 tablets (500 mg total) by mouth at bedtime. 75 tablet 0   PTA Medications: No medications prior to admission.    Patient Stressors: Paediatric nurseinancial difficulties Legal issue Marital or family conflict Medication change or noncompliance Substance abuse  Patient Strengths: Motivation for treatment/growth Physical Health Supportive family/friends  Treatment Modalities: Medication Management, Group therapy, Case management,  1 to 1 session with clinician, Psychoeducation, Recreational therapy.   Physician Treatment Plan for Primary Diagnosis: Bipolar disorder [facility](HCC) Long Term Goal(s):  Improvement in symptoms so as ready for discharge Improvement in symptoms so as ready for discharge   Short Term Goals: Ability to identify changes in lifestyle to reduce recurrence of condition will improve Ability to verbalize feelings will improve Ability to disclose and discuss suicidal ideas Ability to demonstrate self-control will improve Ability to identify and develop effective coping behaviors will improve Ability to maintain clinical measurements within normal limits will improve Compliance with prescribed medications will improve  Medication Management:  Evaluate patient's response, side effects, and tolerance of medication regimen.  Therapeutic Interventions: 1 to 1 sessions, Unit Group sessions and Medication administration.  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)  Long Term Goal(s): Improvement in symptoms so as ready for discharge Improvement in symptoms so as ready for discharge   Short Term Goals: Ability to identify changes in lifestyle to reduce recurrence of condition will improve Ability to verbalize feelings will improve Ability to disclose and discuss suicidal ideas Ability to demonstrate self-control will improve Ability to identify and develop effective coping behaviors will improve Ability to maintain clinical measurements within normal limits will improve Compliance with prescribed medications will improve     Medication Management: Evaluate patient's response, side effects, and tolerance of medication regimen.  Therapeutic Interventions: 1 to 1 sessions, Unit Group sessions and Medication administration.  Evaluation of Outcomes: Adequate for Discharge   RN Treatment Plan for Primary Diagnosis: Bipolar disorder (HCC) Long Term Goal(s): Knowledge of disease and therapeutic regimen to maintain health will improve  Short Term Goals: Ability to remain free from injury will improve, Ability to verbalize frustration and anger appropriately will improve and Ability to verbalize feelings will improve  Medication Management: RN will administer medications as ordered by provider, will assess and evaluate patient's response and provide education to patient for prescribed medication. RN will report any adverse and/or side effects to prescribing provider.  Therapeutic Interventions: 1 on 1 counseling  sessions, Psychoeducation, Medication administration, Evaluate responses to treatment, Monitor vital signs and CBGs as ordered, Perform/monitor CIWA, COWS, AIMS and Fall Risk screenings as ordered, Perform wound care treatments as ordered.  Evaluation of Outcomes: Adequate for Discharge   LCSW Treatment Plan for Primary Diagnosis: Bipolar disorder [facility]) Long Term Goal(s): Safe transition to appropriate next level of care at discharge, Engage patient in therapeutic group addressing interpersonal concerns.  Short Term Goals: Engage patient in aftercare planning with referrals and resources, Facilitate patient progression through stages of change regarding substance use diagnoses and concerns and Identify triggers associated with mental health/substance abuse issues  Therapeutic Interventions: Assess for all discharge needs, 1 to 1 time with Social worker, Explore available resources and support systems, Assess for adequacy in community support network, Educate family and significant other(s) on suicide prevention, Complete Psychosocial Assessment, Interpersonal group therapy.  Evaluation of Outcomes: Adequate for Discharge   Progress in Treatment: Attending groups: Yes. Participating in groups: Yes. Taking medication as prescribed: Yes. Toleration medication: Yes. Family/Significant other contact made: Yes, individual(s) contacted:  the patient's sister and son Patient understands diagnosis: Yes. Discussing patient identified problems/goals with staff: Yes. Medical problems stabilized or resolved: Yes. Denies suicidal/homicidal ideation: Yes. Issues/concerns per patient self-inventory: Yes. Other: n/a   New problem(s) identified: No, Describe:  n/a  New Short Term/Long Term Goal(s): detox, medication management for mood stabilization; elimination  of SI thoughts; development of comprehensive mental wellness/sobriety plan.   Patient Goals:  "To get help with my depression and get back on  the correct medication."   Discharge Plan or Barriers: CSW assessing for appropriate referrals. [facility] appt made with Merlyn Albert. Pt plans to return home at discharge. [facility] pamphlet, Mobile Crisis information, and AA/NA information provided to patient for additional community support and resources.   Reason for Continuation of Hospitalization: None  Estimated Length of Stay: Friday, 06/25/18  Attendees: Patient: Stacie Sanders  06/25/2018 4:10 PM  Physician: Dr. Altamese Roodhouse MD; Dr. Jola Babinski MD; Dr. Nehemiah Massed, MD 06/25/2018 4:10 PM  Nursing: Huntley Dec RN; Marcelino Duster RN; Marchelle Folks.Salena Saner RN 06/25/2018 4:10 PM  RN Care Manager:X 06/25/2018 4:10 PM  Social Worker: Corrie Mckusick LCSW; Baldo Daub, Theresia Majors 06/25/2018 4:10 PM  Recreational Therapist: x 06/25/2018 4:10 PM  Other: Armandina Stammer NP; Hillery Jacks NP 06/25/2018 4:10 PM  Other:  06/25/2018 4:10 PM  Other: 06/25/2018 4:10 PM    Scribe for Treatment Team: Maeola Sarah, LCSWA 06/25/2018 4:10 PM

## 2018-06-25 NOTE — Progress Notes (Signed)
D: Patient has bright affect; states, "I told my sister, I feel great today!"  She had 500 mg of seroquel last night.  Patient exhibits attention seeking behavior.  MD and social worker will speak to family at [id number].  She denies any thoughts of self harm.  She is a possible d/c today.  A: Continue to monitor medication management and MD orders.  Safety checks continued every 15 minutes per protocol.  Offer support and encouragement as needed.  R: Patient is redirectable.

## 2018-06-25 NOTE — Plan of Care (Signed)
  Problem: Education: Goal: Ability to state activities that reduce stress will improve Outcome: Completed/Met   Problem: Coping: Goal: Ability to identify and develop effective coping behavior will improve Outcome: Completed/Met   Problem: Self-Concept: Goal: Ability to identify factors that promote anxiety will improve Outcome: Completed/Met Goal: Level of anxiety will decrease Outcome: Completed/Met Goal: Ability to modify response to factors that promote anxiety will improve Outcome: Completed/Met   Problem: Education: Goal: Knowledge of [facility] Education information/materials will improve Outcome: Completed/Met Goal: Emotional status will improve Outcome: Completed/Met Goal: Mental status will improve Outcome: Completed/Met Goal: Verbalization of understanding the information provided will improve Outcome: Completed/Met   Problem: Education: Goal: Ability to make informed decisions regarding treatment will improve Outcome: Completed/Met   Problem: Coping: Goal: Coping ability will improve Outcome: Completed/Met   Problem: Medication: Goal: Compliance with prescribed medication regimen will improve Outcome: Completed/Met   Problem: Coping: Goal: Coping ability will improve Outcome: Completed/Met Goal: Will verbalize feelings Outcome: Completed/Met

## 2018-06-25 NOTE — [facility] Suicide Risk Assessment (Signed)
[facility] Discharge Suicide Risk Assessment   Principal Problem: Bipolar disorder [facility]) Discharge Diagnoses:  Patient Active Problem List   Diagnosis Date Noted  . Cocaine use disorder, mild, abuse (HCC) [F14.10]   . 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 Bipolar disorder (HCC) [F31.9] 06/18/2018  . Suicide attempt (HCC) [T14.91XA]   . Fatigue [R53.83] 03/01/2018  . Irritability [R45.4] 03/01/2018  . GAD (generalized anxiety disorder) [F41.1] 03/01/2018  . Gingivitis [K05.10] 03/01/2018  . Grinding of teeth [F45.8] 03/01/2018  . Healthcare maintenance [Z00.00] 02/15/2018  . Depression [F32.9] 02/15/2018  . Bipolar 1 disorder (HCC) [F31.9] 02/15/2018  . Cough in adult [R05] 02/15/2018  . Acute bacterial conjunctivitis of right eye [H10.31] 02/15/2018  . Acute respiratory failure (HCC) [J96.00] 01/03/2016  . Acute respiratory failure with hypoxemia (HCC) [J96.01]   . Acute encephalopathy [G93.40]   . Drug overdose [T50.901A] 12/29/2015  . Overdose [T50.901A] 12/29/2015    Total Time spent with patient: 30 minutes  Musculoskeletal: Strength & Muscle Tone: within normal limits Gait & Station: normal Patient leans: N/A  Psychiatric Specialty Exam: ROS denies chest pain, no shortness of breath, no vomiting, no diarrhea, no rash  Blood pressure 105/76, pulse 89, temperature 98.4 F (36.9 C), temperature source Oral, resp. rate 16, height 5\' 5"  (1.651 m), weight 93.9 kg (207 lb), SpO2 100 %.Body mass index is 34.45 kg/m.  General Appearance: Improving grooming  Eye Contact::  Good  Speech:  Normal Rate not pressured or loud-   Volume:  Normal  Mood:  Improving mood states she feels significantly better today  Affect:  Appropriate, not currently expansive or irritable  Thought Process:  Linear and Descriptions of Associations: Intact  Orientation:  Full (Time, Place, and Person)  Thought Content:  No hallucinations, no delusions expressed  Suicidal  Thoughts:  No denies any suicidal ideations, denies any self-injurious ideations, denies any homicidal ideations, specifically denies any homicidal or violent ideations towards her husband or any family member  Homicidal Thoughts:  No  Memory:  Recent and remote grossly intact  Judgement:  Other:  Improving  Insight:  Improving  Psychomotor Activity:  Normal-no psychomotor restlessness or agitation  Concentration:  Good  Recall:  Good  Fund of Knowledge:Good  Language: Good  Akathisia:  Negative  Handed:  Right  AIMS (if indicated):     Assets:  Communication Skills Desire for Improvement Resilience Social Support  Sleep:  Number of Hours: 5.75  Cognition: WNL  ADL's:  Intact   Mental Status Per Nursing Assessment::   On Admission:  NA(denied)  Demographic Factors:  47 year old female, married  Loss Factors: Marital discord/separation  Historical Factors: History of bipolar disorder, history of suicidal attempt by overdose  Risk Reduction Factors:   Responsible for children under 30 years of age, Positive social support and Positive coping skills or problem solving skills  Continued Clinical Symptoms:  Today patient presents alert, attentive, pleasant, well related, not expansive or irritable, speech is normal and it is not pressured or loud, mood is improved and presents euthymic, affect is appropriate, reactive, no thought disorder, no suicidal ideations, no homicidal ideations, specifically denies any homicidal ideations towards her husband, no delusions are expressed, not internally preoccupied, no hallucinations, she is future oriented. With her expressed consent I have spoken with her sister Neysa Bonito and with her adult son Pearline Cables both corroborate that patient seems to be improving and in particular much better today, they are comfortable with her being discharged, they reiterate  that they are supportive and will be spending time with her and providing with family  support. Family members also reiterate that patient had made frequent phone calls during the earlier part of this admission, very upset with her husband due to separation, but not making any threats of physical harm or making homicidal/violent threats .  Patient reports that she intends to avoid her husband, states that she is thinking of obtaining a 50 B in order to insure that " he leaves me alone because I need to work on me ". Denies medication side effects, we have discussed side effect profile to include risk of movement disorders, metabolic disorders, sedation, weight gain, on Seroquel. Labs reviewed-patient has a history of hypercholesterolemia, plans to follow-up with PCP for management.  Cognitive Features That Contribute To Risk:  No gross cognitive deficits noted upon discharge. Is alert , attentive, and oriented x 3   Suicide Risk:  Mild:  Suicidal ideation of limited frequency, intensity, duration, and specificity.  There are no identifiable plans, no associated intent, mild dysphoria and related symptoms, good self-control (both objective and subjective assessment), few other risk factors, and identifiable protective factors, including available and accessible social support.  Follow-up Information    [facility] Follow up on 06/25/2018.   Why:  Appointment on Friday, 6/28 at 8:45AM with Merlyn AlbertFred. Please bring medication list/hospital discharge paperwork. If you are interested in therapy, request this from RussellFred at this appt. Thank you.   Contact information: [address] [place] [address] [phone]           Plan Of Care/Follow-up recommendations:  Activity:  As tolerated Diet:  Heart healthy Tests:  NA Other:  See below  Patient is requesting discharge and at this time there are no ongoing or current grounds for involuntary commitment She is leaving unit in good spirits Family member will pick her up later today Plans to follow-up as above.  She also [facility] for ongoing medical management as needed  Craige CottaFernando A Mellina Benison, MD 06/25/2018, 2:06 PM

## 2018-06-26 LAB — PROLACTIN: Prolactin: 10.1 ng/mL (ref 4.8–23.3)

## 2018-07-27 ENCOUNTER — Inpatient Hospital Stay (HOSPITAL_COMMUNITY)
Admission: EM | Admit: 2018-07-27 | Discharge: 2018-07-30 | DRG: 917 | Disposition: A | Discharge status: Other Acute Inpatient Hospital | Payer: Self-pay | Attending: Family Medicine | Admitting: Family Medicine

## 2018-07-27 ENCOUNTER — Encounter (HOSPITAL_COMMUNITY): Payer: Self-pay

## 2018-07-27 ENCOUNTER — Other Ambulatory Visit: Payer: Self-pay

## 2018-07-27 DIAGNOSIS — I4581 Long QT syndrome: Secondary | ICD-10-CM | POA: Diagnosis present

## 2018-07-27 DIAGNOSIS — T68XXXA Hypothermia, initial encounter: Secondary | ICD-10-CM | POA: Diagnosis present

## 2018-07-27 DIAGNOSIS — E870 Hyperosmolality and hypernatremia: Secondary | ICD-10-CM | POA: Diagnosis present

## 2018-07-27 DIAGNOSIS — T6592XA Toxic effect of unspecified substance, intentional self-harm, initial encounter: Secondary | ICD-10-CM | POA: Diagnosis present

## 2018-07-27 DIAGNOSIS — Z915 Personal history of self-harm: Secondary | ICD-10-CM

## 2018-07-27 DIAGNOSIS — Z801 Family history of malignant neoplasm of trachea, bronchus and lung: Secondary | ICD-10-CM

## 2018-07-27 DIAGNOSIS — Z808 Family history of malignant neoplasm of other organs or systems: Secondary | ICD-10-CM

## 2018-07-27 DIAGNOSIS — F1010 Alcohol abuse, uncomplicated: Secondary | ICD-10-CM | POA: Diagnosis present

## 2018-07-27 DIAGNOSIS — F17210 Nicotine dependence, cigarettes, uncomplicated: Secondary | ICD-10-CM | POA: Diagnosis present

## 2018-07-27 DIAGNOSIS — F319 Bipolar disorder, unspecified: Secondary | ICD-10-CM | POA: Diagnosis present

## 2018-07-27 DIAGNOSIS — E119 Type 2 diabetes mellitus without complications: Secondary | ICD-10-CM | POA: Diagnosis present

## 2018-07-27 DIAGNOSIS — Z9851 Tubal ligation status: Secondary | ICD-10-CM

## 2018-07-27 DIAGNOSIS — F411 Generalized anxiety disorder: Secondary | ICD-10-CM | POA: Diagnosis present

## 2018-07-27 DIAGNOSIS — F909 Attention-deficit hyperactivity disorder, unspecified type: Secondary | ICD-10-CM | POA: Diagnosis present

## 2018-07-27 DIAGNOSIS — J9601 Acute respiratory failure with hypoxia: Secondary | ICD-10-CM | POA: Diagnosis present

## 2018-07-27 DIAGNOSIS — Y92009 Unspecified place in unspecified non-institutional (private) residence as the place of occurrence of the external cause: Secondary | ICD-10-CM

## 2018-07-27 DIAGNOSIS — Z800 Family history of malignant neoplasm of digestive organs: Secondary | ICD-10-CM

## 2018-07-27 DIAGNOSIS — J9690 Respiratory failure, unspecified, unspecified whether with hypoxia or hypercapnia: Secondary | ICD-10-CM | POA: Diagnosis present

## 2018-07-27 DIAGNOSIS — R401 Stupor: Secondary | ICD-10-CM

## 2018-07-27 DIAGNOSIS — T50902A Poisoning by unspecified drugs, medicaments and biological substances, intentional self-harm, initial encounter: Principal | ICD-10-CM | POA: Diagnosis present

## 2018-07-27 DIAGNOSIS — Z781 Physical restraint status: Secondary | ICD-10-CM

## 2018-07-27 DIAGNOSIS — T1491XA Suicide attempt, initial encounter: Secondary | ICD-10-CM | POA: Diagnosis present

## 2018-07-27 DIAGNOSIS — G92 Toxic encephalopathy: Secondary | ICD-10-CM | POA: Diagnosis present

## 2018-07-27 DIAGNOSIS — Y906 Blood alcohol level of 120-199 mg/100 ml: Secondary | ICD-10-CM | POA: Diagnosis present

## 2018-07-27 LAB — CBG MONITORING, ED: Glucose-Capillary: 117 mg/dL — ABNORMAL HIGH (ref 70–99)

## 2018-07-27 MED ORDER — LORAZEPAM 2 MG/ML IJ SOLN
INTRAMUSCULAR | Status: AC
  Administered 2018-07-27: 1 mg via INTRAVENOUS
  Filled 2018-07-27: qty 1

## 2018-07-27 MED ORDER — PROPOFOL 1000 MG/100ML IV EMUL: 0.0000 ug/kg/min | INTRAVENOUS | Status: DC

## 2018-07-27 MED ORDER — FENTANYL CITRATE (PF) 100 MCG/2ML IJ SOLN
INTRAMUSCULAR | Status: AC
  Filled 2018-07-27: qty 2

## 2018-07-27 MED ORDER — PROPOFOL 1000 MG/100ML IV EMUL
0.0000 ug/kg/min | INTRAVENOUS | Status: DC
  Administered 2018-07-27: 5 ug/kg/min via INTRAVENOUS

## 2018-07-27 MED ORDER — SODIUM CHLORIDE 0.9 % IV SOLN
INTRAVENOUS | Status: DC
  Administered 2018-07-27 – 2018-07-29 (×5): via INTRAVENOUS

## 2018-07-27 MED ORDER — LORAZEPAM 2 MG/ML IJ SOLN
1.0000 mg | Freq: Once | INTRAMUSCULAR | Status: AC
  Administered 2018-07-27: 1 mg via INTRAVENOUS

## 2018-07-27 MED ORDER — PROPOFOL 1000 MG/100ML IV EMUL
INTRAVENOUS | Status: AC
  Administered 2018-07-27: 5 ug/kg/min via INTRAVENOUS
  Filled 2018-07-27: qty 100

## 2018-07-27 MED ORDER — SODIUM CHLORIDE 0.9 % IV BOLUS
500.0000 mL | Freq: Once | INTRAVENOUS | Status: AC
  Administered 2018-07-27: 500 mL via INTRAVENOUS

## 2018-07-27 NOTE — ED Provider Notes (Signed)
[facility]-EMERGENCY DEPT Provider Note   CSN: [id number] Arrival date & time: 07/27/18  2300     History   Chief Complaint Chief Complaint  Patient presents with  . Drug Overdose  . Suicide Attempt    HPI Stacie Sanders is a 47 y.o. female.  HPI Patient presents to the emergency room for evaluation of a suspected drug overdose.  Patient has a history of mental health problems, and previous suicide attempts.  Family members were concerned because the patient was having trouble with increasing depression .  She texted family members at 8:00 this evening that she wanted to die.  They found her at home unresponsive.  There were no signs of trauma.  Family presumes that she overdosed although we do not know for sure.  When EMS arrived the patient did not seem to be breathing appropriately.  There were going to attempt to intubate her but felt there was some type of obstruction. Past Medical History:  Diagnosis Date  . Allergic rhinitis   . Bipolar 1 disorder (HCC)   . Borderline diabetes mellitus   . Colon polyp   . Depression   . Dyspnea on exertion   . Esophageal stricture    scheduled for EGD and dilation 03-18-2016  . GAD (generalized anxiety disorder)   . History of acute respiratory failure    12-29-2015  due to polysubstance overdose--  pt intubated --  resolved   . History of febrile seizure    x1  01/ 2017--  none since  . History of kidney stones    staghorn  . History of suicide attempt    12-29-2015  -- overdose klonopin, xanax, alcohol--  acute respirtory failure and acute encenphalopathy -- both resolved  . IBS (irritable bowel syndrome)   . Irregular menstrual cycle   . Renal calculi    bilateral    Patient Active Problem List   Diagnosis Date Noted  . Respiratory failure (HCC) 07/28/2018  . Cocaine use disorder, mild, abuse (HCC)   . 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 Bipolar disorder (HCC) 06/18/2018    . Suicide attempt (HCC)   . Fatigue 03/01/2018  . Irritability 03/01/2018  . GAD (generalized anxiety disorder) 03/01/2018  . Gingivitis 03/01/2018  . Grinding of teeth 03/01/2018  . Healthcare maintenance 02/15/2018  . Depression 02/15/2018  . Bipolar 1 disorder (HCC) 02/15/2018  . Cough in adult 02/15/2018  . Acute bacterial conjunctivitis of right eye 02/15/2018  . Acute respiratory failure (HCC) 01/03/2016  . Acute respiratory failure with hypoxemia (HCC)   . Acute encephalopathy   . Drug overdose 12/29/2015  . Overdose 12/29/2015    Past Surgical History:  Procedure Laterality Date  . COLONOSCOPY    . CYSTOSCOPY WITH STENT PLACEMENT Bilateral 03/10/2016   Procedure: CYSTOSCOPY WITH STENT PLACEMENT;  Surgeon: Malen Gauze, MD;  Location: [facility];  Service: Urology;  Laterality: Bilateral;  . CYSTOSCOPY/RETROGRADE/URETEROSCOPY/STONE EXTRACTION WITH BASKET Bilateral 03/10/2016   Procedure: CYSTOSCOPY/RETROGRADE/URETEROSCOPY/STONE EXTRACTION WITH BASKET;  Surgeon: Malen Gauze, MD;  Location: [facility];  Service: Urology;  Laterality: Bilateral;  . EXTRACORPOREAL SHOCK WAVE LITHOTRIPSY  x2    . PERCUTANEOUS NEPHROSTOLITHOTOMY Right 09-18-2005   staghorn  . TUBAL LIGATION  1999     OB History    Gravida  5   Para      Term      Preterm      AB  2   Living  3     SAB  1   TAB  1   Ectopic      Multiple      Live Births               Home Medications    Prior to Admission medications   Medication Sig Start Date End Date Taking? Authorizing Provider  albuterol (PROVENTIL) (2.5 MG/3ML) 0.083% nebulizer solution Take 3 mLs (2.5 mg total) by nebulization every 2 (two) hours as needed for wheezing. 06/18/18   Maretta Bees, MD  buPROPion (WELLBUTRIN) 100 MG tablet Take 1 tablet (100 mg total) by mouth every morning. 06/26/18   Starkes, Juel Burrow, FNP  famotidine (PEPCID) 20 MG tablet Take 1 tablet (20 mg  total) by mouth daily. 06/19/18   Ghimire, Werner Lean, MD  gabapentin (NEURONTIN) 400 MG capsule Take 1 capsule (400 mg total) by mouth 3 (three) times daily. 06/25/18   Truman Hayward, FNP  hydrOXYzine (ATARAX/VISTARIL) 25 MG tablet Take 1 tablet (25 mg total) by mouth every 6 (six) hours as needed for anxiety. 06/25/18   Truman Hayward, FNP  nicotine polacrilex (NICORETTE) 2 MG gum Take 1 each (2 mg total) by mouth as needed for smoking cessation. 06/25/18   Truman Hayward, FNP  PARoxetine (PAXIL) 20 MG tablet Take 1 tablet (20 mg total) by mouth at bedtime. 06/25/18   Truman Hayward, FNP  QUEtiapine (SEROQUEL) 200 MG tablet Take 2.5 tablets (500 mg total) by mouth at bedtime. 06/25/18   Truman Hayward, FNP    Family History Family History  Problem Relation Age of Onset  . Cancer Father        pancreas  . Melanoma Father   . Cancer Maternal Grandmother        lung  . COPD Paternal Grandmother   . Melanoma Paternal Grandmother     Social History Social History   Tobacco Use  . Smoking status: Current Every Day Smoker    Packs/day: 0.50    Years: 20.00    Pack years: 10.00    Types: Cigarettes  . Smokeless tobacco: Never Used  Substance Use Topics  . Alcohol use: Yes    Comment: occ  . Drug use: No     Allergies   Fentanyl; Percocet [oxycodone-acetaminophen]; and Robaxin [methocarbamol]   Review of Systems Review of Systems  Unable to perform ROS: Mental status change     Physical Exam Updated Vital Signs BP 94/66   Pulse 93   Resp 18   Ht 1.651 m (5\' 5" )   Wt 93.9 kg (207 lb)   LMP  (LMP Unknown)   SpO2 100%   BMI 34.45 kg/m   Physical Exam  HENT:  Head: Normocephalic and atraumatic.  Right Ear: External ear normal.  Left Ear: External ear normal.  No foreign bodies or debris noted in the oropharynx, hypopharynx was visualized using a glide scope, no debris or foreign body noted ,cords were easily visualized  Eyes: Conjunctivae are normal. Right  eye exhibits no discharge. Left eye exhibits no discharge. No scleral icterus.  Neck: Neck supple. No tracheal deviation present.  Cardiovascular: Normal rate, regular rhythm and intact distal pulses.  Pulmonary/Chest: Effort normal and breath sounds normal. No stridor. No respiratory distress. She has no wheezes. She has no rales.  Abdominal: Soft. Bowel sounds are normal. She exhibits no distension. There is no tenderness. There is no rebound and no guarding.  Genitourinary:  Genitourinary Comments: Incontinent of urine  Musculoskeletal: She exhibits no edema or tenderness.  Neurological: She is unresponsive. No cranial nerve deficit (no facial droop, extraocular movements intact, no slurred speech). She exhibits normal muscle tone. She displays no seizure activity. GCS eye subscore is 1. GCS verbal subscore is 1. GCS motor subscore is 1.  She does not respond to verbal or painful stimuli  Skin: Skin is warm and dry. No rash noted.  Nursing note and vitals reviewed.    ED Treatments / Results  Labs (all labs ordered are listed, but only abnormal results are displayed) Labs Reviewed  COMPREHENSIVE METABOLIC PANEL - Abnormal; Notable for the following components:      Result Value   Sodium 150 (*)    Potassium 2.8 (*)    Chloride 118 (*)    Glucose, Bld 130 (*)    Calcium 8.8 (*)    All other components within normal limits  ACETAMINOPHEN LEVEL - Abnormal; Notable for the following components:   Acetaminophen (Tylenol), Serum <10 (*)    All other components within normal limits  ETHANOL - Abnormal; Notable for the following components:   Alcohol, Ethyl (B) 169 (*)    All other components within normal limits  CBG MONITORING, ED - Abnormal; Notable for the following components:   Glucose-Capillary 117 (*)    All other components within normal limits  SALICYLATE LEVEL  RAPID URINE DRUG SCREEN, HOSP PERFORMED  CBC WITH DIFFERENTIAL/PLATELET  TRIGLYCERIDES  BLOOD GAS, ARTERIAL    CBC  BASIC METABOLIC PANEL  BLOOD GAS, ARTERIAL  TRIGLYCERIDES  CBC  CREATININE, SERUM  I-STAT BETA HCG BLOOD, ED ([facility], [facility], [facility] ONLY)    EKG EKG Interpretation  Date/Time:  Tuesday July 27 2018 23:01:21 EDT Ventricular Rate:  120 PR Interval:    QRS Duration: 88 QT Interval:  360 QTC Calculation: 509 R Axis:   35 Text Interpretation:  Sinus tachycardia Multiple ventricular premature complexes Aberrant conduction of SV complex(es) Borderline ST depression, lateral leads , new since last tracing Borderline prolonged QT interval , new since last tracing Since last tracing rate faster Confirmed by Linwood DibblesKnapp, Ita Fritzsche [phone]([id number]) on 07/27/2018 11:44:20 PM   Radiology No results found.  Procedures Procedure Name: Intubation Date/Time: 07/27/2018 11:18 PM Performed by: Linwood DibblesKnapp, Devaris Quirk, MD Pre-anesthesia Checklist: Patient identified, Patient being monitored, Emergency Drugs available, Timeout performed and Suction available Oxygen Delivery Method: Non-rebreather mask Preoxygenation: Pre-oxygenation with 100% oxygen Induction Type: Cricoid Pressure applied Ventilation: Mask ventilation without difficulty Laryngoscope Size: Glidescope and 4 Tube size: 7.5 mm Number of attempts: 1 Airway Equipment and Method: Video-laryngoscopy Placement Confirmation: ETT inserted through vocal cords under direct vision,  CO2 detector and Breath sounds checked- equal and bilateral Secured at: 25 cm Tube secured with: ETT holder Dental Injury: Teeth and Oropharynx as per pre-operative assessment      .Critical Care Performed by: Linwood DibblesKnapp, Azaliah Carrero, MD Authorized by: Linwood DibblesKnapp, Kwali Wrinkle, MD   Critical care provider statement:    Critical care time (minutes):  35   Critical care was time spent personally by me on the following activities:  Discussions with consultants, evaluation of patient's response to treatment, examination of patient, ordering and performing treatments and interventions, ordering and review of laboratory  studies, ordering and review of radiographic studies, pulse oximetry, re-evaluation of patient's condition, obtaining history from patient or surrogate and review of old charts   (including critical care time)  Medications Ordered in ED Medications  fentaNYL (SUBLIMAZE) 100 MCG/2ML injection (  Not Given 07/27/18 2315)  sodium chloride 0.9 % bolus 500 mL (0 mLs Intravenous Stopped 07/27/18 2332)    And  0.9 %  sodium chloride infusion ( Intravenous New Bag/Given 07/27/18 2338)  sodium chloride flush (NS) 0.9 % injection 3 mL (has no administration in time range)  sodium chloride flush (NS) 0.9 % injection 3 mL (has no administration in time range)  0.9 %  sodium chloride infusion (has no administration in time range)  ondansetron (ZOFRAN) injection 4 mg (has no administration in time range)  famotidine (PEPCID) IVPB 20 mg premix (has no administration in time range)  0.9 %  sodium chloride infusion (has no administration in time range)  propofol (DIPRIVAN) 1000 MG/100ML infusion (has no administration in time range)  enoxaparin (LOVENOX) injection 40 mg (has no administration in time range)  famotidine (PEPCID) IVPB 20 mg premix (has no administration in time range)  potassium chloride 10 mEq in 100 mL IVPB (has no administration in time range)  LORazepam (ATIVAN) injection 1 mg (1 mg Intravenous Given 07/27/18 2335)     Initial Impression / Assessment and Plan / ED Course  I have reviewed the triage vital signs and the nursing notes.  Pertinent labs & imaging results that were available during my care of the patient were reviewed by me and considered in my medical decision making (see chart for details).  Clinical Course as of Jul 28 36  Wed Jul 28, 2018  0036 Labs reviewed.  Potassium level low.  Potassium infusion ordered.   [JK]    Clinical Course User Index [JK] Linwood Dibbles, MD    She presented to the emergency room for evaluation of altered mental status.  This was presumed  to be due to an overdose.  Patient texted family members earlier this evening.  Patient about feeling suicidal.  They went to check on her after they did not hear from her for a couple of hours and found her unresponsive.  EMS noticed alcohol and pill bottles.  It is unclear exactly what she took.  Patient was unresponsive on arrival.  She was intubated for airway protection.  Laboratory tests are pending.  Plan admission to the ICU for further test treatment  Final Clinical Impressions(s) / ED Diagnoses   Final diagnoses:  Intentional drug overdose, initial encounter (HCC)  Evlyn Courier, MD 07/28/18 [phone]

## 2018-07-27 NOTE — ED Triage Notes (Signed)
Pt presents via EMS obtunded s/p overdose on an unknown amount of medications and alcohol. Patient last heard from at 2009 per a text message to her friend. Patient is prescribed neurotin, seroquel and other unknown medications. Unknown exactly what patient took. ETOH on board also. Patient unresponsive with no gag reflex on arrival. Pt intubated 2305 by EDP Knapp.

## 2018-07-27 NOTE — ED Notes (Signed)
Bed: RESB Expected date:  Expected time:  Means of arrival:  Comments: [age] F OD/SI/ unresponsive- airway difficulty

## 2018-07-28 ENCOUNTER — Other Ambulatory Visit (HOSPITAL_COMMUNITY): Payer: Self-pay

## 2018-07-28 ENCOUNTER — Emergency Department (HOSPITAL_COMMUNITY): Payer: Self-pay

## 2018-07-28 ENCOUNTER — Inpatient Hospital Stay (HOSPITAL_COMMUNITY): Payer: Self-pay

## 2018-07-28 DIAGNOSIS — G9340 Encephalopathy, unspecified: Secondary | ICD-10-CM

## 2018-07-28 DIAGNOSIS — J9690 Respiratory failure, unspecified, unspecified whether with hypoxia or hypercapnia: Secondary | ICD-10-CM | POA: Diagnosis present

## 2018-07-28 LAB — BLOOD GAS, ARTERIAL
ACID-BASE DEFICIT: 4.9 mmol/L — AB (ref 0.0–2.0)
Bicarbonate: 20.9 mmol/L (ref 20.0–28.0)
DRAWN BY: 232811
FIO2: 60
MECHVT: 460 mL
O2 SAT: 99 %
PCO2 ART: 39.9 mmHg (ref 32.0–48.0)
PEEP/CPAP: 5 cmH2O
PH ART: 7.326 — AB (ref 7.350–7.450)
Patient temperature: 94.9
RATE: 18 resp/min
pO2, Arterial: 180 mmHg — ABNORMAL HIGH (ref 83.0–108.0)

## 2018-07-28 LAB — GLUCOSE, CAPILLARY
GLUCOSE-CAPILLARY: 110 mg/dL — AB (ref 70–99)
Glucose-Capillary: 101 mg/dL — ABNORMAL HIGH (ref 70–99)
Glucose-Capillary: 104 mg/dL — ABNORMAL HIGH (ref 70–99)
Glucose-Capillary: 111 mg/dL — ABNORMAL HIGH (ref 70–99)

## 2018-07-28 LAB — COMPREHENSIVE METABOLIC PANEL
ALK PHOS: 86 U/L (ref 38–126)
ALT: 28 U/L (ref 0–44)
ANION GAP: 10 (ref 5–15)
AST: 23 U/L (ref 15–41)
Albumin: 3.9 g/dL (ref 3.5–5.0)
BUN: 12 mg/dL (ref 6–20)
CALCIUM: 8.8 mg/dL — AB (ref 8.9–10.3)
CO2: 22 mmol/L (ref 22–32)
Chloride: 118 mmol/L — ABNORMAL HIGH (ref 98–111)
Creatinine, Ser: 0.81 mg/dL (ref 0.44–1.00)
GFR calc Af Amer: >60 mL/min (ref >60)
Glucose, Bld: 130 mg/dL — ABNORMAL HIGH (ref 70–99)
Potassium: 2.8 mmol/L — ABNORMAL LOW (ref 3.5–5.1)
SODIUM: 150 mmol/L — AB (ref 135–145)
TOTAL PROTEIN: 7.2 g/dL (ref 6.5–8.1)
Total Bilirubin: 0.4 mg/dL (ref 0.3–1.2)

## 2018-07-28 LAB — RAPID URINE DRUG SCREEN, HOSP PERFORMED
Amphetamines: NOT DETECTED
Barbiturates: NOT DETECTED
Benzodiazepines: NOT DETECTED
Cocaine: NOT DETECTED
Opiates: NOT DETECTED
Tetrahydrocannabinol: NOT DETECTED

## 2018-07-28 LAB — CBC
HCT: 40.1 % (ref 36.0–46.0)
Hemoglobin: 12.9 g/dL (ref 12.0–15.0)
MCH: 30.4 pg (ref 26.0–34.0)
MCHC: 32.2 g/dL (ref 30.0–36.0)
MCV: 94.6 fL (ref 78.0–100.0)
PLATELETS: 353 10*3/uL (ref 150–400)
RBC: 4.24 MIL/uL (ref 3.87–5.11)
RDW: 15 % (ref 11.5–15.5)
WBC: 6.9 10*3/uL (ref 4.0–10.5)

## 2018-07-28 LAB — CBC WITH DIFFERENTIAL/PLATELET
BASOS ABS: 0 10*3/uL (ref 0.0–0.1)
Basophils Relative: 0 %
Eosinophils Absolute: 0.1 10*3/uL (ref 0.0–0.7)
Eosinophils Relative: 1 %
HCT: 40.7 % (ref 36.0–46.0)
Hemoglobin: 13.2 g/dL (ref 12.0–15.0)
LYMPHS ABS: 3.1 10*3/uL (ref 0.7–4.0)
LYMPHS PCT: 44 %
MCH: 30.8 pg (ref 26.0–34.0)
MCHC: 32.4 g/dL (ref 30.0–36.0)
MCV: 95.1 fL (ref 78.0–100.0)
Monocytes Absolute: 0.3 10*3/uL (ref 0.1–1.0)
Monocytes Relative: 4 %
Neutro Abs: 3.5 10*3/uL (ref 1.7–7.7)
Neutrophils Relative %: 51 %
Platelets: 363 10*3/uL (ref 150–400)
RBC: 4.28 MIL/uL (ref 3.87–5.11)
RDW: 14.9 % (ref 11.5–15.5)
WBC: 7 10*3/uL (ref 4.0–10.5)

## 2018-07-28 LAB — BASIC METABOLIC PANEL
ANION GAP: 9 (ref 5–15)
BUN: 12 mg/dL (ref 6–20)
CALCIUM: 8.8 mg/dL — AB (ref 8.9–10.3)
CO2: 22 mmol/L (ref 22–32)
Chloride: 118 mmol/L — ABNORMAL HIGH (ref 98–111)
Creatinine, Ser: 0.9 mg/dL (ref 0.44–1.00)
GFR calc non Af Amer: >60 mL/min (ref >60)
GLUCOSE: 97 mg/dL (ref 70–99)
POTASSIUM: 3.5 mmol/L (ref 3.5–5.1)
Sodium: 149 mmol/L — ABNORMAL HIGH (ref 135–145)

## 2018-07-28 LAB — ACETAMINOPHEN LEVEL

## 2018-07-28 LAB — MAGNESIUM: MAGNESIUM: 1.9 mg/dL (ref 1.7–2.4)

## 2018-07-28 LAB — MRSA PCR SCREENING: MRSA BY PCR: NEGATIVE

## 2018-07-28 LAB — TRIGLYCERIDES
TRIGLYCERIDES: 106 mg/dL (ref <150)
Triglycerides: 197 mg/dL — ABNORMAL HIGH (ref <150)

## 2018-07-28 LAB — ETHANOL: ALCOHOL ETHYL (B): 169 mg/dL — AB (ref <10)

## 2018-07-28 LAB — I-STAT BETA HCG BLOOD, ED (MC, WL, AP ONLY): I-stat hCG, quantitative: <5 m[IU]/mL (ref <5)

## 2018-07-28 LAB — SALICYLATE LEVEL

## 2018-07-28 MED ORDER — ADULT MULTIVITAMIN LIQUID CH
15.0000 mL | Freq: Every day | ORAL | Status: DC
  Administered 2018-07-28 – 2018-07-30 (×3): 15 mL
  Filled 2018-07-28 (×3): qty 15

## 2018-07-28 MED ORDER — FOLIC ACID 1 MG PO TABS
1.0000 mg | ORAL_TABLET | Freq: Every day | ORAL | Status: DC
  Administered 2018-07-28 – 2018-07-30 (×3): 1 mg
  Filled 2018-07-28 (×3): qty 1

## 2018-07-28 MED ORDER — VITAMIN B-1 100 MG PO TABS
100.0000 mg | ORAL_TABLET | Freq: Every day | ORAL | Status: DC
  Administered 2018-07-28 – 2018-07-30 (×3): 100 mg
  Filled 2018-07-28 (×3): qty 1

## 2018-07-28 MED ORDER — ONDANSETRON HCL 4 MG/2ML IJ SOLN: 4.0000 mg | Freq: Four times a day (QID) | INTRAMUSCULAR | Status: DC | PRN

## 2018-07-28 MED ORDER — SODIUM CHLORIDE 0.9% FLUSH
3.0000 mL | Freq: Two times a day (BID) | INTRAVENOUS | Status: DC
  Administered 2018-07-28 – 2018-07-30 (×4): 3 mL via INTRAVENOUS

## 2018-07-28 MED ORDER — ORAL CARE MOUTH RINSE
15.0000 mL | OROMUCOSAL | Status: DC
  Administered 2018-07-28 – 2018-07-29 (×14): 15 mL via OROMUCOSAL

## 2018-07-28 MED ORDER — SODIUM CHLORIDE 0.9 % IV SOLN: 250.0000 mL | INTRAVENOUS | Status: DC | PRN

## 2018-07-28 MED ORDER — VITAL HIGH PROTEIN PO LIQD: 1000.0000 mL | ORAL | Status: DC

## 2018-07-28 MED ORDER — FAMOTIDINE IN NACL 20-0.9 MG/50ML-% IV SOLN
20.0000 mg | Freq: Two times a day (BID) | INTRAVENOUS | Status: DC
  Administered 2018-07-28 – 2018-07-29 (×3): 20 mg via INTRAVENOUS
  Filled 2018-07-28 (×3): qty 50

## 2018-07-28 MED ORDER — FAMOTIDINE IN NACL 20-0.9 MG/50ML-% IV SOLN
20.0000 mg | Freq: Two times a day (BID) | INTRAVENOUS | Status: DC
  Filled 2018-07-28: qty 50

## 2018-07-28 MED ORDER — POTASSIUM CHLORIDE 10 MEQ/100ML IV SOLN
10.0000 meq | INTRAVENOUS | Status: AC
  Administered 2018-07-28 (×2): 10 meq via INTRAVENOUS
  Filled 2018-07-28 (×2): qty 100

## 2018-07-28 MED ORDER — CHLORHEXIDINE GLUCONATE 0.12% ORAL RINSE (MEDLINE KIT)
15.0000 mL | Freq: Two times a day (BID) | OROMUCOSAL | Status: DC
  Administered 2018-07-28 – 2018-07-30 (×5): 15 mL via OROMUCOSAL

## 2018-07-28 MED ORDER — SODIUM CHLORIDE 0.9% FLUSH: 3.0000 mL | INTRAVENOUS | Status: DC | PRN

## 2018-07-28 MED ORDER — MIDAZOLAM HCL 2 MG/2ML IJ SOLN
2.0000 mg | INTRAMUSCULAR | Status: DC | PRN
  Administered 2018-07-28 – 2018-07-29 (×3): 2 mg via INTRAVENOUS
  Filled 2018-07-28 (×3): qty 2

## 2018-07-28 MED ORDER — PROPOFOL 1000 MG/100ML IV EMUL
0.0000 ug/kg/min | INTRAVENOUS | Status: DC
  Administered 2018-07-28 (×2): 5 ug/kg/min via INTRAVENOUS
  Administered 2018-07-29 (×2): 50 ug/kg/min via INTRAVENOUS
  Filled 2018-07-28 (×3): qty 100

## 2018-07-28 MED ORDER — ENOXAPARIN SODIUM 40 MG/0.4ML ~~LOC~~ SOLN
40.0000 mg | SUBCUTANEOUS | Status: DC
  Administered 2018-07-28 – 2018-07-30 (×3): 40 mg via SUBCUTANEOUS
  Filled 2018-07-28 (×3): qty 0.4

## 2018-07-28 MED ORDER — VITAL HIGH PROTEIN PO LIQD
1000.0000 mL | ORAL | Status: DC
  Administered 2018-07-28: 1000 mL

## 2018-07-28 MED ORDER — PRO-STAT SUGAR FREE PO LIQD
30.0000 mL | Freq: Two times a day (BID) | ORAL | Status: DC
  Administered 2018-07-28 – 2018-07-29 (×3): 30 mL
  Filled 2018-07-28 (×3): qty 30

## 2018-07-28 MED ORDER — MAGNESIUM SULFATE 2 GM/50ML IV SOLN
2.0000 g | Freq: Once | INTRAVENOUS | Status: AC
  Administered 2018-07-28: 2 g via INTRAVENOUS
  Filled 2018-07-28: qty 50

## 2018-07-28 NOTE — ED Notes (Signed)
Propofol stopped per Dr.

## 2018-07-28 NOTE — Progress Notes (Signed)
eLink Physician-Brief Progress Note Patient Name: Stacie Sanders DOB: 08/30/1971 MRN: [id number]   Date of Service  07/28/2018  HPI/Events of Note  47 yo female with acute encephalopathy  From probable acute drug OD with acute resp failure  eICU Interventions  Admit to ICU Vent support Follow up labs and CT head Supportive care DVT/GI prx      Intervention Category Evaluation Type: New Patient Evaluation  Bette Brienza 07/28/2018, 12:18 AM

## 2018-07-28 NOTE — ED Notes (Signed)
Warmer placed on pt.

## 2018-07-28 NOTE — Progress Notes (Addendum)
.. ..  Name: Stacie Sanders MRN: [id number] DOB: May 30, 1971    ADMISSION DATE:  07/27/2018 CONSULTATION DATE:  07/28/18  REFERRING MD :  EDPLynelle Doctor MD  CHIEF COMPLAINT:  Intentional Overdose with Suicidal Ideation  BRIEF PATIENT DESCRIPTION: 47 yr old F, recently admitted in June (17th) for overdose and respiratory failure.  She has a PMHx significant for DM, Bipolar disorder (h/o suicidal ideations in the past), allergic rhinitis, anxiety, ADHD, and severe GERD s/p esophageal dilatation.   She presented to [facility] via EMS on 7/30 after being found unresponsive by her children.  It appears that recently the patient has been struggling with increased depressive episodes. The patient was intubated in the ER for airway protection.  Initial labs-NA 150, K2.8, chloride 118, CO2 22, glucose 130, BUN 12, creatinine 0.81, AST 23, ALT 28, BBC 7, hemoglobin 13.2, and platelets 363.  Initial chest x-ray negative for edema or infiltrate. PCCM called for ICU admit.    SUBJECTIVE:  Tmax 99.5, WBC 6.9. I/O- positive 470 since admit. No acute events per RN.  Pt waking some.   VITAL SIGNS: Temp:  [94.9 F (34.9 C)-99.5 F (37.5 C)] 99.5 F (37.5 C) (07/31 0800) Pulse Rate:  [93-118] 101 (07/31 0830) Resp:  [10-21] 12 (07/31 0830) BP: (90-162)/(54-135) 112/61 (07/31 0830) SpO2:  [92 %-100 %] 92 % (07/31 0830) FiO2 (%):  [30 %-60 %] 30 % (07/31 0754) Weight:  [207 lb (93.9 kg)-210 lb 1.6 oz (95.3 kg)] 210 lb 1.6 oz (95.3 kg) (07/31 0315)  PHYSICAL EXAMINATION: General: adult female lying in bed on mechanical ventilation in no acute distress HEENT: MM pink/moist, ETT Neuro: Arouses to sternal rub, moves all extremities spontaneously but does not follow commands CV: s1s2 rrr, no M/R/G PULM: even/non-labored, lungs bilaterally coarse WU:JWJX, non-tender, bsx4 active  Extremities: warm/dry, no edema, poorly kept / dirty feet Skin: no rashes or lesions, multiple tattoos   Recent Labs  Lab  07/27/18 2344 07/28/18 0245  NA 150* 149*  K 2.8* 3.5  CL 118* 118*  CO2 22 22  BUN 12 12  CREATININE 0.81 0.90  GLUCOSE 130* 97   Recent Labs  Lab 07/27/18 2344 07/28/18 0245  HGB 13.2 12.9  HCT 40.7 40.1  WBC 7.0 6.9  PLT 363 353   Dg Abdomen 1 View  Result Date: 07/28/2018 CLINICAL DATA:  Post intubation, OG position EXAM: ABDOMEN - 1 VIEW COMPARISON:  06/14/2018 FINDINGS: Enteric tube terminates in the gastric antrum. Nonobstructive bowel gas pattern. Visualized osseous structures are within normal limits. IMPRESSION: Enteric tube terminates in the gastric antrum. Electronically Signed   By: Charline Bills M.D.   On: 07/28/2018 00:50   Ct Head Wo Contrast  Result Date: 07/28/2018 CLINICAL DATA:  Initial evaluation for acute altered mental status, overdose. EXAM: CT HEAD WITHOUT CONTRAST TECHNIQUE: Contiguous axial images were obtained from the base of the skull through the vertex without intravenous contrast. COMPARISON:  Prior MRI from 06/15/2018 FINDINGS: Brain: Cerebral volume within normal limits for patient age. No evidence for acute intracranial hemorrhage. No findings to suggest acute large vessel territory infarct. No mass lesion, midline shift, or mass effect. Ventricles are normal in size without evidence for hydrocephalus. No extra-axial fluid collection identified. Vascular: No hyperdense vessel identified. Skull: Scalp soft tissues demonstrate no acute abnormality. Calvarium intact. Sinuses/Orbits: Globes and orbital soft tissues within normal limits. Mild mucosal thickening within the ethmoidal air cells and maxillary sinuses, chronic in appearance. Small right mastoid effusion noted. IMPRESSION: Negative  head CT.  No acute intracranial abnormality. Electronically Signed   By: Rise MuBenjamin  McClintock M.D.   On: 07/28/2018 02:38   Dg Chest Portable 1 View  Result Date: 07/28/2018 CLINICAL DATA:  ETT position EXAM: PORTABLE CHEST 1 VIEW COMPARISON:  06/08/2018 FINDINGS:  Endotracheal tube terminates 2 cm above the carina. Enteric tube courses into the distal stomach. Lungs are clear.  No pleural effusion or pneumothorax. Cardiomegaly. IMPRESSION: Endotracheal tube terminates 2 cm above the carina. Enteric tube courses into the distal stomach. Electronically Signed   By: Charline BillsSriyesh  Krishnan M.D.   On: 07/28/2018 00:50   LINES / TUBES  ETT 7/31 >>   CULTURES   ANTIBIOTICS   STUDIES UDS 7/30 >> negative  ETOH 7/30 >> 169 Salicylate 7/30 >> less than 7 CT Head 7/31 >> negative   EVENTS  7/31  Admit    ASSESSMENT / PLAN:   Acute Metabolic Encephalopathy - secondary to suspected intentional overdose  P: Supportive care  Will need PSY eval once able to participate  Propofol if necessary for comfort  PRN versed if needed (has allergy to fentanyl) Safety sitter once more alert   ETOH Abuse - at risk withdrawal, level on admit169 P: Monitor for withdrawal over 8/1-8/2  Thiamine, folate, MVI PRN versed as above  Acute Hypoxic Respiratory Failure - in setting of overdose P: PRVC 8 cc/kg  Wean PEEP / FiO2 for sats > 90% Follow CXR  Monitor for evidence of aspiration   Prolonged QTc P: Hold home seroquel, paxil  Follow EKG, repeat in am  Avoid QT prolonging medications as able  Hypothermia - on admit, resolved P: Monitor fever curve, WBC trend   At Risk Malnutrition  P: Begin TF 7/31  NPO / OGT   Best Practice: DVT- lovenox SUP - pepcid Diet - TF   CC Time: 30 minutes   Canary BrimBrandi Ariya Bohannon, NP-C [facility] Pgr: [phone] or if no answer [phone] 07/28/2018, 10:07 AM

## 2018-07-28 NOTE — Progress Notes (Signed)
Pt transported on vent 100% fio2 from ED to CT, then ICU 1231.  Pt tolerated transport well without incident.  Fio2 returned back down to 40% as per previous settings.

## 2018-07-28 NOTE — Progress Notes (Signed)
Poison Control called to receive updates on pt. Recommended EKGs q4-6hrs until QT and QRS normalize, then EKGs q8-12 hrs. EKG performed. Will continue to monitor.

## 2018-07-28 NOTE — Progress Notes (Signed)
Initial Nutrition Assessment  DOCUMENTATION CODES:   Obesity unspecified  INTERVENTION:  - Will order Vital High Protein @ 45 mL/hr with 30 mL Prostat BID. This regimen will provide 1280 kcal, 124 grams of protein, and 903 mL free water.  - Free water per PCCM given hypernatremia and current IVF.   NUTRITION DIAGNOSIS:   Inadequate oral intake related to inability to eat as evidenced by NPO status.  GOAL:   Provide needs based on ASPEN/SCCM guidelines  MONITOR:   Vent status, TF tolerance, Weight trends, Labs, I & O's  REASON FOR ASSESSMENT:   Ventilator, Consult Enteral/tube feeding initiation and management  ASSESSMENT:   47 yr old female s/p intentional OD with polypharmacy including ETOH now intubated and on mechanical ventilation. PCCM consulted for admission.  Patient intubated with OGT in place and xray results report states that the tip of tube is in the gastric antrum. No family/visitors at bedside. Per chart revidew, weight has been fluctuating since December 2018. Patient being discussed by RN, PCCM NP, and tech at the time of RD visit. Plan for TF to start today.   Per Brandi's note this AM: metabolic encephalopathy 2/2 suspected intentional OD, plan for Psych eval once appropriate, hx of alcohol abuse with risk for withdrawal, acute hypoxic respiratory failure 2/2 OD, at risk for malnutrition.   Patient is currently intubated on ventilator support MV: 9.9 L/min Temp (24hrs), Avg:97.9 F (36.6 C), Min:94.9 F (34.9 C), Max:99.5 F (37.5 C) Propofol: none BP: 133/73 and MAP: 91   Medications reviewed; 20 mg IV Pepcid BID, 1 mg folic acid per OGT/day, 15 mL liquid multivitamin per OGT/day, 10 mEq IV KCl x3 runs today, 100 mg thiamine per OGT/day.  Labs reviewed; Na: 149 mmol/L, Cl: 118 mmol/L, Ca: 8.8 mg/dL. IVF: NS @ 125 mL/hr (3L/day)     NUTRITION - FOCUSED PHYSICAL EXAM:  Will attempt at follow-up.   Diet Order:   Diet Order           Diet NPO  time specified  Diet effective now          EDUCATION NEEDS:   No education needs have been identified at this time  Skin:  Skin Assessment: Reviewed RN Assessment  Last BM:  PTA/unknown  Height:   Ht Readings from Last 1 Encounters:  07/27/18 5\' 5"  (1.651 m)    Weight:   Wt Readings from Last 1 Encounters:  07/28/18 210 lb 1.6 oz (95.3 kg)    Ideal Body Weight:  56.82 kg  BMI:  Body mass index is 34.96 kg/m.  Estimated Nutritional Needs:   Kcal:  [id number] (11-14 kcal/kg)  Protein:  >/= 114 grams (2 grams/kg IBW)  Fluid:  >/= 1.8 L/day     Trenton GammonJessica Maricruz Lucero, MS, RD, LDN, [facility] Inpatient Clinical Dietitian Pager # [phone] After hours/weekend pager # [phone]

## 2018-07-28 NOTE — H&P (Signed)
.. ..  Name: Stacie Sanders MRN: [id number] DOB: 1971/04/11    ADMISSION DATE:  07/27/2018 CONSULTATION DATE:  07/28/18  REFERRING MD :  EDPLynelle Doctor MD  CHIEF COMPLAINT:  Intentional Overdose with Suicidal Ideation  BRIEF PATIENT DESCRIPTION:  47 yr old female s/p intentional OD with polypharmacy including ETOH now intubated and on mechanical ventilation. PCCM consulted for admission.  SIGNIFICANT EVENTS  intubated  STUDIES:  CXR ABD film CTH pending   HISTORY OF PRESENT ILLNESS:  (History was obtained from EMR and detailed acct given by other providers - unable to elicit history from patient due to acute encephalopathy, pt is intubated)  47 yr old patient known to [facility] service admitted in June 17th for Overdose and respiratory failure, PMHx significant for DM, Bipolar disorder (h/o suicidal ideations in the past), Allergic rhinitis, Anxiety, ADHD, and severe GERD s/p esophageal dilatation, presents to [facility] via EMS. It appears that recently patient has been struggling with increased depressive episodes. Emergency services were called to the patient's home by her sons when she was found to be unresponsive.  Pt was intubated in the ED for Acute respiratory failure.  PAST MEDICAL HISTORY :   has a past medical history of Allergic rhinitis, Bipolar 1 disorder (HCC), Borderline diabetes mellitus, Colon polyp, Depression, Dyspnea on exertion, Esophageal stricture, GAD (generalized anxiety disorder), History of acute respiratory failure, History of febrile seizure, History of kidney stones, History of suicide attempt, IBS (irritable bowel syndrome), Irregular menstrual cycle, and Renal calculi.  has a past surgical history that includes Percutaneous nephrostolithotomy (Right, 09-18-2005); Tubal ligation (1999); Extracorporeal shock wave lithotripsy (x2  ); Cystoscopy/retrograde/ureteroscopy/stone extraction with basket (Bilateral, 03/10/2016); Cystoscopy with stent placement (Bilateral,  03/10/2016); and Colonoscopy. Prior to Admission medications   Medication Sig Start Date End Date Taking? Authorizing Provider  albuterol (PROVENTIL) (2.5 MG/3ML) 0.083% nebulizer solution Take 3 mLs (2.5 mg total) by nebulization every 2 (two) hours as needed for wheezing. 06/18/18   Stacie Bees, MD  buPROPion (WELLBUTRIN) 100 MG tablet Take 1 tablet (100 mg total) by mouth every morning. 06/26/18   Stacie Sanders, Stacie Burrow, FNP  famotidine (PEPCID) 20 MG tablet Take 1 tablet (20 mg total) by mouth daily. 06/19/18   Stacie Sanders, Stacie Lean, MD  gabapentin (NEURONTIN) 400 MG capsule Take 1 capsule (400 mg total) by mouth 3 (three) times daily. 06/25/18   Stacie Hayward, FNP  hydrOXYzine (ATARAX/VISTARIL) 25 MG tablet Take 1 tablet (25 mg total) by mouth every 6 (six) hours as needed for anxiety. 06/25/18   Stacie Hayward, FNP  nicotine polacrilex (NICORETTE) 2 MG gum Take 1 each (2 mg total) by mouth as needed for smoking cessation. 06/25/18   Stacie Hayward, FNP  PARoxetine (PAXIL) 20 MG tablet Take 1 tablet (20 mg total) by mouth at bedtime. 06/25/18   Stacie Hayward, FNP  QUEtiapine (SEROQUEL) 200 MG tablet Take 2.5 tablets (500 mg total) by mouth at bedtime. 06/25/18   Stacie Hayward, FNP   Allergies  Allergen Reactions  . Fentanyl Hives and Itching  . Percocet [Oxycodone-Acetaminophen] Hives and Itching  . Robaxin [Methocarbamol] Rash    FAMILY HISTORY:  family history includes COPD in her paternal grandmother; Cancer in her father and maternal grandmother; Melanoma in her father and paternal grandmother. SOCIAL HISTORY:  reports that she has been smoking cigarettes.  She has a 10.00 pack-year smoking history. She has never used smokeless tobacco. She reports that she drinks alcohol. She reports that she does  not use drugs.  REVIEW OF SYSTEMS:  Unable to obtain due to acute encephalopathy, pt is intubated  Constitutional: Negative for fever, chills, weight loss, malaise/fatigue and  diaphoresis.  HENT: Negative for hearing loss, ear pain, nosebleeds, congestion, sore throat, neck pain, tinnitus and ear discharge.   Eyes: Negative for blurred vision, double vision, photophobia, pain, discharge and redness.  Respiratory: Negative for cough, hemoptysis, sputum production, shortness of breath, wheezing and stridor.   Cardiovascular: Negative for chest pain, palpitations, orthopnea, claudication, leg swelling and PND.  Gastrointestinal: Negative for heartburn, nausea, vomiting, abdominal pain, diarrhea, constipation, blood in stool and melena.  Genitourinary: Negative for dysuria, urgency, frequency, hematuria and flank pain.  Musculoskeletal: Negative for myalgias, back pain, joint pain and falls.  Skin: Negative for itching and rash.  Neurological: Negative for dizziness, tingling, tremors, sensory change, speech change, focal weakness, seizures, loss of consciousness, weakness and headaches.  Endo/Heme/Allergies: Negative for environmental allergies and polydipsia. Does not bruise/bleed easily.  SUBJECTIVE:   VITAL SIGNS: Pulse Rate:  [115-118] 118 (07/30 2310) Resp:  [16-18] 18 (07/30 2310) BP: (120-125)/(73-81) 125/81 (07/30 2310) SpO2:  [100 %] 100 % (07/30 2310) FiO2 (%):  [60 %] 60 % (07/30 2310) Weight:  [93.9 kg (207 lb)] 93.9 kg (207 lb) (07/30 2345)  PHYSICAL EXAMINATION: General:  Obese female sedated on propofol Neuro:  Pupil sluggishly reactive, RASS -2 HEENT:  NCAT, ETT in oropharynx, OGT in oropharynx Cardiovascular:  S1 and S2 appreciated,  Lungs:  B/l breath soun Abdomen:  Soft non distended + BS in all 4 quadrants  Musculoskeletal:  No lower  Skin: Grossly intact, small healing abrasion noted on left elbow, cracked heels  No results for input(s): NA, K, CL, CO2, BUN, CREATININE, GLUCOSE in the last 168 hours. Recent Labs  Lab 07/27/18 2344  HGB 13.2  HCT 40.7  WBC 7.0  PLT 363   No results found.  ASSESSMENT / PLAN: 1. Acute  encephalopathy secondary to polypharmacy ?intentional overdose 2. Acute respiratory failure requiring intubation for protection of airway  2. Prolonged QT 3. G2DD EF 55-60% S/P    NEURO: Altered mental status secondary to acute encephalopathy preceded by worsening depression-> suicidal ideation Hold propofol Reassess mental status Try to avoid meds that will prolong QT further Pt allergic to fentanyl per EMR- will use Intermittent versed as tolerated.  ETOH abuse- risk for withdrawl - last level 169, assuming last ETOH was on 7/30 prior to admission highest risk of withdrawal is 48-72 hrs post CIWA-AR Thiamine and folate  CARDIAC: Hemodynamically stable Not on Vasopressors  continue on Cardiac monitoring Prolonged QT h/o seroquel and paxil use.  Repeat EKG in next 12-24 hrs Avoid QT prolonging meds when possible Check CK G2DD EF 55-60%  PULMONARY: Acute Hypoxic Respiratory Failure Secondary to Acute Metabolic encephalopathy Depressed respiratory drive and decreased protective reflexes High Aspiration risk Intubated on Mechanical ventilation TV 8 cc/kg Latest ABG:   ID: No active issues at this time WBC 7, hypothermic on AER Send PCT to establish a baseline Trend WBC, fever curve and Lactic acid  Endocrine: H/o Diabetes mellitus: Hold all home meds Phase 1 glycemic control while inpatient  Last Hgb A1c was 5.6 in June 2019- no need to repeat   GI: NPO OGT in place HOB > 30 degrees Aspiration precautions  If pt intubated for >24 hrs assess TF needs Prophylaxis indicated   Heme: No acute issues at this time hemoglobin- 13.2 platelets 363 Hgb<7 transfuse PRBCs There are no signs  of active bleeding No  h/o coagulopathy DVT PPx-> SCDs and he  RENAL Serum Osm approx 300 Baseline Cr Indwelling foley catheter- monitor UOP UA w/reflex Lab Results  Component Value Date   CREATININE 1.01 (H) 06/25/2018   CREATININE 0.95 06/18/2018   CREATININE 0.92  06/17/2018  Hypokalemia Hypernatremia..No data found.    I, Dr Newell Coral have personally reviewed patient's available data, including medical history, events of note, physical examination and test results as part of my evaluation. I have discussed with  other care providers such as pharmacist, RN and Elink. The patient is critically ill with multiple organ systems failure and requires high complexity decision making for assessment and support, frequent evaluation and titration of therapies, application of advanced monitoring technologies and extensive interpretation of multiple databases. Critical Care Time devoted to patient care services described in this note is 51 Minutes. This time reflects time of care of this signee Dr Newell Coral. This critical care time does not reflect procedure time, or teaching time or supervisory time but could involve care discussion time    DISPOSITION: ICU CC TIME: 51 minutes PROGNOSIS: Guarded FAMILY:2 sons who were present initially with patient.  SOCIAL: this pt has had  Similar attempts, concern for patient's safety once extubated full Psych consult to follow.    Signed Dr Newell Coral Pulmonary Critical Care Locums   07/28/2018, 12:26 AM

## 2018-07-28 NOTE — Progress Notes (Signed)
eLink Physician-Brief Progress Note Patient Name: Stacie Sanders DOB: 04-13-1971 MRN: [id number]   Date of Service  07/28/2018  HPI/Events of Note  Agitation impacting safety including risk of self-extubation  eICU Interventions  Bilateral soft wrist restraints        [name] [name] 07/28/2018, 11:11 PM

## 2018-07-29 ENCOUNTER — Inpatient Hospital Stay (HOSPITAL_COMMUNITY): Payer: Self-pay

## 2018-07-29 DIAGNOSIS — T50902A Poisoning by unspecified drugs, medicaments and biological substances, intentional self-harm, initial encounter: Principal | ICD-10-CM

## 2018-07-29 DIAGNOSIS — Z563 Stressful work schedule: Secondary | ICD-10-CM

## 2018-07-29 DIAGNOSIS — Z818 Family history of other mental and behavioral disorders: Secondary | ICD-10-CM

## 2018-07-29 DIAGNOSIS — F1010 Alcohol abuse, uncomplicated: Secondary | ICD-10-CM

## 2018-07-29 DIAGNOSIS — J9600 Acute respiratory failure, unspecified whether with hypoxia or hypercapnia: Secondary | ICD-10-CM

## 2018-07-29 DIAGNOSIS — G4700 Insomnia, unspecified: Secondary | ICD-10-CM

## 2018-07-29 DIAGNOSIS — F419 Anxiety disorder, unspecified: Secondary | ICD-10-CM

## 2018-07-29 DIAGNOSIS — J9690 Respiratory failure, unspecified, unspecified whether with hypoxia or hypercapnia: Secondary | ICD-10-CM

## 2018-07-29 DIAGNOSIS — F17210 Nicotine dependence, cigarettes, uncomplicated: Secondary | ICD-10-CM

## 2018-07-29 DIAGNOSIS — Y906 Blood alcohol level of 120-199 mg/100 ml: Secondary | ICD-10-CM

## 2018-07-29 LAB — CBC
HCT: 42 % (ref 36.0–46.0)
HEMOGLOBIN: 13.4 g/dL (ref 12.0–15.0)
MCH: 30.7 pg (ref 26.0–34.0)
MCHC: 31.9 g/dL (ref 30.0–36.0)
MCV: 96.3 fL (ref 78.0–100.0)
Platelets: 317 10*3/uL (ref 150–400)
RBC: 4.36 MIL/uL (ref 3.87–5.11)
RDW: 15.6 % — AB (ref 11.5–15.5)
WBC: 12.5 10*3/uL — AB (ref 4.0–10.5)

## 2018-07-29 LAB — BASIC METABOLIC PANEL
ANION GAP: 9 (ref 5–15)
Anion gap: 8 (ref 5–15)
Anion gap: 6 (ref 5–15)
BUN: 13 mg/dL (ref 6–20)
BUN: 14 mg/dL (ref 6–20)
BUN: 12 mg/dL (ref 6–20)
CALCIUM: 9 mg/dL (ref 8.9–10.3)
CALCIUM: 8.9 mg/dL (ref 8.9–10.3)
CALCIUM: 8.9 mg/dL (ref 8.9–10.3)
CO2: 26 mmol/L (ref 22–32)
CO2: 27 mmol/L (ref 22–32)
CO2: 30 mmol/L (ref 22–32)
CREATININE: 0.67 mg/dL (ref 0.44–1.00)
CREATININE: 0.72 mg/dL (ref 0.44–1.00)
Chloride: 125 mmol/L — ABNORMAL HIGH (ref 98–111)
Chloride: 114 mmol/L — ABNORMAL HIGH (ref 98–111)
Chloride: 110 mmol/L (ref 98–111)
Creatinine, Ser: 0.74 mg/dL (ref 0.44–1.00)
GFR calc Af Amer: >60 mL/min (ref >60)
GFR calc non Af Amer: >60 mL/min (ref >60)
GFR calc non Af Amer: >60 mL/min (ref >60)
GFR calc non Af Amer: >60 mL/min (ref >60)
GLUCOSE: 119 mg/dL — AB (ref 70–99)
GLUCOSE: 128 mg/dL — AB (ref 70–99)
GLUCOSE: 93 mg/dL (ref 70–99)
Potassium: 4.1 mmol/L (ref 3.5–5.1)
Potassium: 3.8 mmol/L (ref 3.5–5.1)
Potassium: 3.5 mmol/L (ref 3.5–5.1)
Sodium: 160 mmol/L — ABNORMAL HIGH (ref 135–145)
Sodium: 149 mmol/L — ABNORMAL HIGH (ref 135–145)
Sodium: 146 mmol/L — ABNORMAL HIGH (ref 135–145)

## 2018-07-29 LAB — LACTIC ACID, PLASMA: LACTIC ACID, VENOUS: 1.8 mmol/L (ref 0.5–1.9)

## 2018-07-29 LAB — GLUCOSE, CAPILLARY
Glucose-Capillary: 115 mg/dL — ABNORMAL HIGH (ref 70–99)
Glucose-Capillary: 114 mg/dL — ABNORMAL HIGH (ref 70–99)
Glucose-Capillary: 123 mg/dL — ABNORMAL HIGH (ref 70–99)
Glucose-Capillary: 145 mg/dL — ABNORMAL HIGH (ref 70–99)
Glucose-Capillary: 125 mg/dL — ABNORMAL HIGH (ref 70–99)

## 2018-07-29 LAB — MAGNESIUM: Magnesium: 2.3 mg/dL (ref 1.7–2.4)

## 2018-07-29 LAB — PHOSPHORUS: Phosphorus: 2.4 mg/dL — ABNORMAL LOW (ref 2.5–4.6)

## 2018-07-29 MED ORDER — FREE WATER
200.0000 mL | Status: DC
  Administered 2018-07-29: 200 mL

## 2018-07-29 MED ORDER — ORAL CARE MOUTH RINSE
15.0000 mL | Freq: Two times a day (BID) | OROMUCOSAL | Status: DC
  Administered 2018-07-30: 15 mL via OROMUCOSAL

## 2018-07-29 MED ORDER — DEXTROSE 5 % IV SOLN: 10.0000 mmol | Freq: Once | INTRAVENOUS | Status: DC

## 2018-07-29 MED ORDER — DEXTROSE 5 % IV SOLN
INTRAVENOUS | Status: DC
  Administered 2018-07-29: 11:00:00 via INTRAVENOUS

## 2018-07-29 MED ORDER — POTASSIUM PHOSPHATES 15 MMOLE/5ML IV SOLN
10.0000 mmol | Freq: Once | INTRAVENOUS | Status: AC
  Administered 2018-07-29: 10 mmol via INTRAVENOUS
  Filled 2018-07-29: qty 3.33

## 2018-07-29 MED ORDER — CHLORHEXIDINE GLUCONATE 0.12 % MT SOLN
15.0000 mL | Freq: Two times a day (BID) | OROMUCOSAL | Status: DC
  Administered 2018-07-29 – 2018-07-30 (×2): 15 mL via OROMUCOSAL
  Filled 2018-07-29 (×2): qty 15

## 2018-07-29 MED ORDER — FAMOTIDINE 20 MG PO TABS
20.0000 mg | ORAL_TABLET | Freq: Every day | ORAL | Status: DC
  Administered 2018-07-29: 20 mg via ORAL
  Filled 2018-07-29: qty 1

## 2018-07-29 NOTE — Progress Notes (Signed)
Patient was transferred from ICU at 1744. Vital signs was taken. Alert and oriented x 4. Sitter at bedside. Will continue to monitor patient

## 2018-07-29 NOTE — Progress Notes (Signed)
.. ..  Name: Stacie Sanders MRN: [id number] DOB: 1971-04-15    ADMISSION DATE:  07/27/2018 CONSULTATION DATE:  07/28/18  REFERRING MD :  EDPLynelle Doctor MD  CHIEF COMPLAINT:  Intentional Overdose with Suicidal Ideation  BRIEF PATIENT DESCRIPTION: 47 yr old F, recently admitted in June (17th) for overdose and respiratory failure.  She has a PMHx significant for DM, Bipolar disorder (h/o suicidal ideations in the past), allergic rhinitis, anxiety, ADHD, and severe GERD s/p esophageal dilatation.   She presented to [facility] via EMS on 7/30 after being found unresponsive by her children.  It appears that recently the patient has been struggling with increased depressive episodes. The patient was intubated in the ER for airway protection.  Initial labs-NA 150, K2.8, chloride 118, CO2 22, glucose 130, BUN 12, creatinine 0.81, AST 23, ALT 28, BBC 7, hemoglobin 13.2, and platelets 363.  Initial chest x-ray negative for edema or infiltrate. PCCM called for ICU admit.   EVENTS  7/31  Admit . - positive 470 since admit. No acute events per RN.  Pt waking some.  ETT 7/31 >>  UDS 7/30 >> negative  ETOH 7/30 >> 169 Salicylate 7/30 >> less than 7 CT Head 7/31 >> negative     SUBJECTIVE/OVERNIGHT/INTERVAL HX 07/29/2018 - meets extubation criteria. Priior per RN - has left AMA soon after extubation. D./w Psych social work -> patient to express AMA first before IVC papers can be faxed.    VITAL SIGNS: Temp:  [98.8 F (37.1 C)-101.5 F (38.6 C)] 98.9 F (37.2 C) (08/01 0752) Pulse Rate:  [31-106] 97 (08/01 0900) Resp:  [11-25] 17 (08/01 0900) BP: (120-175)/(70-106) 143/90 (08/01 0900) SpO2:  [75 %-100 %] 100 % (08/01 0900) FiO2 (%):  [30 %] 30 % (08/01 0800) Weight:  [96.3 kg (212 lb 4.9 oz)] 96.3 kg (212 lb 4.9 oz) (08/01 0323)  PHYSICAL EXAMINATION:  General Appearance:    Looks better  Head:    Normocephalic, without obvious abnormality, atraumatic  Eyes:    PERRL -yes conjunctiva/corneas - clear       Ears:    Normal external ear canals, both ears  Nose:   NG tube - no  Throat:  ETT TUBE - yes , OG tube - yes  Neck:   Supple,  No enlargement/tenderness/nodules     Lungs:     Clear to auscultation bilaterally, Ventilator   Synchrony - yes on PSVT  Chest wall:    No deformity  Heart:    S1 and S2 normal, no murmur, CVP - no.  Pressors - no  Abdomen:     Soft, no masses, no organomegaly  Genitalia:    Not done  Rectal:   not done  Extremities:   Extremities- intact     Skin:   Intact in exposed areas . Has scattered tattoos     Neurologic:   Sedation - none -> RASS - +1 . Moves all 4s - yes. CAM-ICU - neg . Orientation - x3+. Denies current suicidal or homicidal tendenices. Feels well enougg to go home    PULMONARY Recent Labs  Lab 07/28/18 0032  PHART 7.326*  PCO2ART 39.9  PO2ART 180*  HCO3 20.9  O2SAT 99.0    CBC Recent Labs  Lab 07/27/18 2344 07/28/18 0245 07/29/18 0325  HGB 13.2 12.9 13.4  HCT 40.7 40.1 42.0  WBC 7.0 6.9 12.5*  PLT 363 353 317    COAGULATION No results for input(s): INR in the last 168 hours.  CARDIAC  No results for input(s): TROPONINI in the last 168 hours. No results for input(s): PROBNP in the last 168 hours.   CHEMISTRY Recent Labs  Lab 07/27/18 2344 07/28/18 0245 07/29/18 0325  NA 150* 149* 160*  K 2.8* 3.5 4.1  CL 118* 118* 125*  CO2 22 22 26   GLUCOSE 130* 97 119*  BUN 12 12 13   CREATININE 0.81 0.90 0.74  CALCIUM 8.8* 8.8* 9.0  MG  --  1.9 2.3  PHOS  --   --  2.4*   Estimated Creatinine Clearance: 101.9 mL/min (by C-G formula based on SCr of 0.74 mg/dL).   LIVER Recent Labs  Lab 07/27/18 2344  AST 23  ALT 28  ALKPHOS 86  BILITOT 0.4  PROT 7.2  ALBUMIN 3.9     INFECTIOUS Recent Labs  Lab 07/29/18 0328  LATICACIDVEN 1.8     ENDOCRINE CBG (last 3)  Recent Labs    07/28/18 2333 07/29/18 0306 07/29/18 0740  GLUCAP 104* 115* 114*         IMAGING x48h  - image(s) personally visualized   -   highlighted in bold Dg Abdomen 1 View  Result Date: 07/28/2018 CLINICAL DATA:  Post intubation, OG position EXAM: ABDOMEN - 1 VIEW COMPARISON:  06/14/2018 FINDINGS: Enteric tube terminates in the gastric antrum. Nonobstructive bowel gas pattern. Visualized osseous structures are within normal limits. IMPRESSION: Enteric tube terminates in the gastric antrum. Electronically Signed   By: Charline BillsSriyesh  Krishnan M.D.   On: 07/28/2018 00:50   Ct Head Wo Contrast  Result Date: 07/28/2018 CLINICAL DATA:  Initial evaluation for acute altered mental status, overdose. EXAM: CT HEAD WITHOUT CONTRAST TECHNIQUE: Contiguous axial images were obtained from the base of the skull through the vertex without intravenous contrast. COMPARISON:  Prior MRI from 06/15/2018 FINDINGS: Brain: Cerebral volume within normal limits for patient age. No evidence for acute intracranial hemorrhage. No findings to suggest acute large vessel territory infarct. No mass lesion, midline shift, or mass effect. Ventricles are normal in size without evidence for hydrocephalus. No extra-axial fluid collection identified. Vascular: No hyperdense vessel identified. Skull: Scalp soft tissues demonstrate no acute abnormality. Calvarium intact. Sinuses/Orbits: Globes and orbital soft tissues within normal limits. Mild mucosal thickening within the ethmoidal air cells and maxillary sinuses, chronic in appearance. Small right mastoid effusion noted. IMPRESSION: Negative head CT.  No acute intracranial abnormality. Electronically Signed   By: Rise MuBenjamin  McClintock M.D.   On: 07/28/2018 02:38   Dg Chest Port 1 View  Result Date: 07/29/2018 CLINICAL DATA:  Respiratory failure. EXAM: PORTABLE CHEST 1 VIEW COMPARISON:  07/27/2018. FINDINGS: Mediastinal prominence noted. This may be related to prominent great vessels and patient rotation. Follow-up PA lateral chest x-ray suggested. Endotracheal tube and NG tube noted stable position. Heart size normal. Low lung  volumes with basilar infiltrates. No pleural effusion or pneumothorax. IMPRESSION: 1.  Lines and tubes in stable position. 2. Mediastinal prominence is noted. This may be related to pulmonary venous congestion and patient rotation. With the patient's capable follow-up PA lateral chest x-ray to exclude mediastinal widening suggested. 3. Low lung volumes with basilar/infiltrates. Electronically Signed   By: Maisie Fushomas  Register   On: 07/29/2018 09:11   Dg Chest Portable 1 View  Result Date: 07/28/2018 CLINICAL DATA:  ETT position EXAM: PORTABLE CHEST 1 VIEW COMPARISON:  06/08/2018 FINDINGS: Endotracheal tube terminates 2 cm above the carina. Enteric tube courses into the distal stomach. Lungs are clear.  No pleural effusion or pneumothorax. Cardiomegaly. IMPRESSION:  Endotracheal tube terminates 2 cm above the carina. Enteric tube courses into the distal stomach. Electronically Signed   By: Charline Bills M.D.   On: 07/28/2018 00:50      ASSESSMENT / PLAN:   Acute Metabolic Encephalopathy - secondary to suspected intentional overdose    - resolved encephalopathy 07/29/2018  P: saftery sitter to continue Psych consult placed If declares to leave AMA - > do Involuntary commitment   ETOH Abuse - at risk withdrawal, level on admit169   - 07/29/2018  - currently none  P: Monitor thimaine Folic  Acute Hypoxic Respiratory Failure - in setting of overdose   07/29/2018 - resolved. REports of mediastinal widening on port CXR P: Extubate Get 2 view CXR 07/30/18   Prolonged QTc   - resolved . QTc  P: Hold home seroquel, paxil     Hypothermia - on admit, resolved P: Monitor fever curve, WBC trend   At Risk Malnutrition  P: DC tF for extubation   Electrolye imbalance - mild low phos - very high Na - hypernatremia  Plan Dc saline Start free water Replete phos  PSYCH - Likely needs [facility]    Best Practice: DVT- lovenox SUP - pepcid Diet - TF  Dispo - continue  sitter. Move to med surg. TRH take over 07/30/18 and ccm off. - svc paged    Dr. Kalman Shan, M.D., [facility] [place].C.P Pulmonary and Critical Care Medicine Staff Physician, [facility] Director - Interstitial Lung Disease  Program  Pulmonary Fibrosis [facility] [place], [place], [address]  Pager: [phone], If no answer or between  15:00h - 7:00h: call [phone] Telephone: [phone]

## 2018-07-29 NOTE — Procedures (Signed)
Extubation Procedure Note  Patient Details:   Name: Stacie Sanders DOB: 1971-04-13 MRN: [id number]   Airway Documentation:    Vent end date: 07/29/18 Vent end time: 1031   Evaluation  O2 sats: stable throughout Complications: No apparent complications Patient did tolerate procedure well. Bilateral Breath Sounds: Diminished   Yes  Suzan GaribaldiCraddock, [name] Ann 07/29/2018, 10:31 AM

## 2018-07-29 NOTE — Consult Note (Addendum)
[facility] Consult   Reason for Consult:  Suicide attempt by overdose  Referring Physician:  Dr. Chase Caller Patient Identification: Stacie Sanders MRN:  [id number] Principal Diagnosis: Suicide attempt [facility]) Diagnosis:   Patient Active Problem List   Diagnosis Date Noted  . Respiratory failure ([place]) [J96.90] 07/28/2018  . Cocaine use disorder, mild, abuse ([place]) [F14.10]   . Alcohol intoxication delirium with mild use disorder ([place]) [F10.121]   . Bipolar disorder ([place]) [F31.9] 06/18/2018  . Suicide attempt ([place]) [T14.91XA]   . Fatigue [R53.83] 03/01/2018  . Irritability [R45.4] 03/01/2018  . GAD (generalized anxiety disorder) [F41.1] 03/01/2018  . Gingivitis [K05.10] 03/01/2018  . Grinding of teeth [F45.8] 03/01/2018  . Healthcare maintenance [Z00.00] 02/15/2018  . Depression [F32.9] 02/15/2018  . Bipolar 1 disorder ([place]) [F31.9] 02/15/2018  . Cough in adult [R05] 02/15/2018  . Acute bacterial conjunctivitis of right eye [H10.31] 02/15/2018  . Acute respiratory failure ([place]) [J96.00] 01/03/2016  . Acute respiratory failure with hypoxemia ([place]) [J96.01]   . Acute encephalopathy [G93.40]   . Drug overdose [T50.901A] 12/29/2015  . Overdose [T50.901A] 12/29/2015    Total Time spent with patient: 1 hour  Subjective:   Stacie Sanders is a 47 y.o. female patient admitted with suspected drug overdose.  HPI:  Per chart review, patient was admitted with suspected drug overdose. Her family reports that she has been increasingly depressed and sent them a text around 8 pm that stated she wanted to die the day of admission. She was found unresponsive at home by her sons. Her family presumes that she overdosed.  EMS found alcohol and pill bottles at her residence. She required intubation for airway protection. She was extubated today. BAL was 169 and UDS was negative on admission.   Of note, patient was discharged from [facility] on 6/28. She was treated for suicide attempt by  drug overdose in the setting of her husband being unfaithful. She was found at home unresponsive with empty bill bottles of Buspar, Gabapentin, Paxil, Seroquel and Atarax. There were also beer bottles present. She continued to deny suicide attempt throughout hospitalization and reported confusing her pill packets since her doctor recently changed the package. Discharge medications included Seroquel 500 mg qhs, Paxil 20 mg qhs, Wellbutrin 100 mg daily, Gabapentin 400 mg TID and Atarax 25 mg q 6 hours PRN. She was scheduled followed with [facility].   On interview, Stacie Sanders denies SI and recent suicide attempt.  She reports that she got her medication confused due to drinking alcohol.  She reports that she asked her family to come help her sort her medications because they were still in the packs that they shipped in.  She believes that she took more medications than she was prescribed.  She reports likely taking a double dose of Seroquel and Gabapentin.  She has been drinking more in order to cope with her stress.  She is now separated from her husband and he is living with his girlfriend in [place].  She reports," I would not give that son of a bitch everything he wants" when referring to her husband."  She reports feeling angry that the house may be foreclosed.  She also reports feelings of anger towards her children.  She believes that they have went to her house to sort through her belongings because they think she is dead from her suicide attempt.  She reports hearing this information from a friend.  She reports financial stressors and plans to look for employment.  She denies HI or AVH.  She reports fluctuations in her appetite although her weight has remained stable.  She reports decreased sleep time (4 to 6 hours nightly versus her usual 7-8 hours nightly).  She reports medication compliance.  She has not followed up with [facility] since discharge from [facility].  She admits to overdosing the last time she was  admitted to the hospital although she denied it throughout her [facility] admission. She reports that her last manic episode was in 02/10/15 after her grandson died by drowning in a pool.   Patient's stepsister was contacted by phone with her verbal consent.  Her stepsister reports that her mood fluctuates.  She attempted suicide 2 weeks ago.  She grabbed a screwdriver and stated, "I am done."  She told her stepsister that she was going to the woods where no one would find her.  Her stepsister called the cops but she was not brought to the hospital because they felt like she was okay.  Her stepsister reports that she is able to present well to others.  She told her stepsister that she would "end it in one of two ways."  She reported that she would kill her husband, his girlfriend and then herself.  She sent her a text last night and stated, "I quit. I'm done. When I get home I'm dead."  Her stepsister found pills scattered all over her house when she arrived last night.  She has been telling others information that is not true but might believe it. She has most recently been telling others that she had a miscarriage and was seen rocking her hands back and forth as if she was cradling a baby last night.   Past Psychiatric History: Bipolar disorder, anxiety and ADHD. She has a prior history of suicide attempt by overdose in 02/10/15 after her grandson died.   Risk to Self:  Yes given recent suicide attempt.  Risk to Others:  Yes. Stepsister reports that patient reported thoughts to harm her husband and his girlfriend.  Prior Inpatient Therapy:  She was hospitalized at [facility] in June for suicide attempt.  Prior Outpatient Therapy:  She is followed at [facility]. Prior medications include Latuda (caused weight gain), Lithium and Lamictal.   Past Medical History:  Past Medical History:  Diagnosis Date  . Allergic rhinitis   . Bipolar 1 disorder ([place])   . Borderline diabetes mellitus   . Colon polyp   . Depression   .  Dyspnea on exertion   . Esophageal stricture    scheduled for EGD and dilation 03-18-2016  . GAD (generalized anxiety disorder)   . History of acute respiratory failure    12-29-2015  due to polysubstance overdose--  pt intubated --  resolved   . History of febrile seizure    x1  01/ 02-11-16--  none since  . History of kidney stones    [place]  . History of suicide attempt    12-29-2015  -- overdose klonopin, xanax, alcohol--  acute respirtory failure and acute encenphalopathy -- both resolved  . IBS (irritable bowel syndrome)   . Irregular menstrual cycle   . Renal calculi    bilateral    Past Surgical History:  Procedure Laterality Date  . COLONOSCOPY    . CYSTOSCOPY WITH STENT PLACEMENT Bilateral 03/10/2016   Procedure: CYSTOSCOPY WITH STENT PLACEMENT;  Surgeon: Cleon Gustin, MD;  Location: [facility];  Service: Urology;  Laterality: Bilateral;  . CYSTOSCOPY/RETROGRADE/URETEROSCOPY/STONE EXTRACTION WITH BASKET Bilateral  03/10/2016   Procedure: CYSTOSCOPY/RETROGRADE/URETEROSCOPY/STONE EXTRACTION WITH BASKET;  Surgeon: Cleon Gustin, MD;  Location: [facility];  Service: Urology;  Laterality: Bilateral;  . EXTRACORPOREAL SHOCK WAVE LITHOTRIPSY  x2    . PERCUTANEOUS NEPHROSTOLITHOTOMY Right 09-18-2005   [place]  . TUBAL LIGATION  1999   Family History:  Family History  Problem Relation Age of Onset  . Cancer Father        pancreas  . Melanoma Father   . Cancer Maternal Grandmother        lung  . COPD Paternal Grandmother   . Melanoma Paternal Grandmother    Family Psychiatric  History: Son-attempted suicide and father-schizophrenia.   Social History:  Social History   Substance and Sexual Activity  Alcohol Use Yes   Comment: occ     Social History   Substance and Sexual Activity  Drug Use No    Social History   Socioeconomic History  . Marital status: Married    Spouse name: Not on file  . Number of children: Not on  file  . Years of education: Not on file  . Highest education level: Not on file  Occupational History  . Not on file  Social Needs  . Financial resource strain: Not on file  . Food insecurity:    Worry: Not on file    Inability: Not on file  . Transportation needs:    Medical: Not on file    Non-medical: Not on file  Tobacco Use  . Smoking status: Current Every Day Smoker    Packs/day: 0.50    Years: 20.00    Pack years: 10.00    Types: Cigarettes  . Smokeless tobacco: Never Used  Substance and Sexual Activity  . Alcohol use: Yes    Comment: occ  . Drug use: No  . Sexual activity: Yes    Birth control/protection: Surgical  Lifestyle  . Physical activity:    Days per week: 0 days    Minutes per session: 0 min  . Stress: To some extent  Relationships  . Social connections:    Talks on phone: More than three times a week    Gets together: Never    Attends religious service: Never    Active member of club or organization: No    Attends meetings of clubs or organizations: Never    Relationship status: Married  Other Topics Concern  . Not on file  Social History Narrative  . Not on file   Additional Social History: She is married x 22 years although in the process of separation. She lives alone. Her husband lives in [place] with his girlfriend. She has 2 adult sons. She is unemployed. She reports recent heavy alcohol use. She denies illicit substance use. She does report a past history of cocaine use.     Allergies:   Allergies  Allergen Reactions  . Fentanyl Hives and Itching  . Percocet [Oxycodone-Acetaminophen] Hives and Itching  . Robaxin [Methocarbamol] Rash    Labs:  Results for orders placed or performed during the hospital encounter of 07/27/18 (from the past 48 hour(s))  CBG monitoring, ED     Status: Abnormal   Collection Time: 07/27/18 11:42 PM  Result Value Ref Range   Glucose-Capillary 117 (H) 70 - 99 mg/dL  Triglycerides     Status: None    Collection Time: 07/27/18 11:44 PM  Result Value Ref Range   Triglycerides 106 <150 mg/dL    Comment: RESULTS CONFIRMED BY  MANUAL DILUTION Performed at [facility] [address] [place], [place] [address]   Comprehensive metabolic panel     Status: Abnormal   Collection Time: 07/27/18 11:44 PM  Result Value Ref Range   Sodium 150 (H) 135 - 145 mmol/L   Potassium 2.8 (L) 3.5 - 5.1 mmol/L   Chloride 118 (H) 98 - 111 mmol/L   CO2 22 22 - 32 mmol/L   Glucose, Bld 130 (H) 70 - 99 mg/dL   BUN 12 6 - 20 mg/dL   Creatinine, Ser 0.81 0.44 - 1.00 mg/dL   Calcium 8.8 (L) 8.9 - 10.3 mg/dL   Total Protein 7.2 6.5 - 8.1 g/dL   Albumin 3.9 3.5 - 5.0 g/dL   AST 23 15 - 41 U/L   ALT 28 0 - 44 U/L   Alkaline Phosphatase 86 38 - 126 U/L   Total Bilirubin 0.4 0.3 - 1.2 mg/dL   GFR calc non Af Amer >60 >60 mL/min   GFR calc Af Amer >60 >60 mL/min    Comment: (NOTE) The eGFR has been calculated using the CKD EPI equation. This calculation has not been validated in all clinical situations. eGFR's persistently <60 mL/min signify possible Chronic Kidney Disease.    Anion gap 10 5 - 15    Comment: Performed at [facility], [place] [address] [place], [place] [address]  Salicylate level     Status: None   Collection Time: 07/27/18 11:44 PM  Result Value Ref Range   Salicylate Lvl <8.3 2.8 - 30.0 mg/dL    Comment: Performed at [facility], [place] [address] [place], [place] [address]  Acetaminophen level     Status: Abnormal   Collection Time: 07/27/18 11:44 PM  Result Value Ref Range   Acetaminophen (Tylenol), Serum <10 (L) 10 - 30 ug/mL    Comment: (NOTE) Therapeutic concentrations vary significantly. A range of 10-30 ug/mL  may be an effective concentration for many patients. However, some  are best treated at concentrations outside of this range. Acetaminophen concentrations >150 ug/mL at 4 hours after ingestion  and >50 ug/mL at 12  hours after ingestion are often associated with  toxic reactions. Performed at [facility], [place] [address], [place], [place] [address]   Ethanol     Status: Abnormal   Collection Time: 07/27/18 11:44 PM  Result Value Ref Range   Alcohol, Ethyl (B) 169 (H) <10 mg/dL    Comment: (NOTE) Lowest detectable limit for serum alcohol is 10 mg/dL. For medical purposes only. Performed at [facility], [place] [address] [place], [place] [address]   Urine rapid drug screen (hosp performed)     Status: None   Collection Time: 07/27/18 11:44 PM  Result Value Ref Range   Opiates NONE DETECTED NONE DETECTED   Cocaine NONE DETECTED NONE DETECTED   Benzodiazepines NONE DETECTED NONE DETECTED   Amphetamines NONE DETECTED NONE DETECTED   Tetrahydrocannabinol NONE DETECTED NONE DETECTED   Barbiturates NONE DETECTED NONE DETECTED    Comment: (NOTE) DRUG SCREEN FOR MEDICAL PURPOSES ONLY.  IF CONFIRMATION IS NEEDED FOR ANY PURPOSE, NOTIFY LAB WITHIN 5 DAYS. LOWEST DETECTABLE LIMITS FOR URINE DRUG SCREEN Drug Class                     Cutoff (ng/mL) Amphetamine and metabolites    1000 Barbiturate and metabolites    200 Benzodiazepine  157 Tricyclics and metabolites     300 Opiates and metabolites        300 Cocaine and metabolites        300 THC                            50 Performed at [facility], [place] [address], [place], [place] [address]   CBC WITH DIFFERENTIAL     Status: None   Collection Time: 07/27/18 11:44 PM  Result Value Ref Range   WBC 7.0 4.0 - 10.5 K/uL   RBC 4.28 3.87 - 5.11 MIL/uL   Hemoglobin 13.2 12.0 - 15.0 g/dL   HCT 40.7 36.0 - 46.0 %   MCV 95.1 78.0 - 100.0 fL   MCH 30.8 26.0 - 34.0 pg   MCHC 32.4 30.0 - 36.0 g/dL   RDW 14.9 11.5 - 15.5 %   Platelets 363 150 - 400 K/uL   Neutrophils Relative % 51 %   Neutro Abs 3.5 1.7 - 7.7 K/uL   Lymphocytes Relative 44 %   Lymphs Abs 3.1 0.7 - 4.0 K/uL    Monocytes Relative 4 %   Monocytes Absolute 0.3 0.1 - 1.0 K/uL   Eosinophils Relative 1 %   Eosinophils Absolute 0.1 0.0 - 0.7 K/uL   Basophils Relative 0 %   Basophils Absolute 0.0 0.0 - 0.1 K/uL    Comment: Performed at [facility], [place] [address], [place], [place] [address]  I-Stat beta hCG blood, ED     Status: None   Collection Time: 07/27/18 11:50 PM  Result Value Ref Range   I-stat hCG, quantitative <5.0 <5 mIU/mL   Comment 3            Comment:   GEST. AGE      CONC.  (mIU/mL)   <=1 WEEK        5 - 50     2 WEEKS       50 - 500     3 WEEKS       100 - 10,000     4 WEEKS     1,000 - 30,000        FEMALE AND NON-PREGNANT FEMALE:     LESS THAN 5 mIU/mL   Blood gas, arterial ([facility] & [facility] ONLY)     Status: Abnormal   Collection Time: 07/28/18 12:32 AM  Result Value Ref Range   FIO2 60.00    Delivery systems VENTILATOR    Mode PRESSURE REGULATED VOLUME CONTROL    VT 460 mL   LHR 18 resp/min   Peep/cpap 5.0 cm H20   pH, Arterial 7.326 (L) 7.350 - 7.450   pCO2 arterial 39.9 32.0 - 48.0 mmHg   pO2, Arterial 180 (H) 83.0 - 108.0 mmHg   Bicarbonate 20.9 20.0 - 28.0 mmol/L   Acid-base deficit 4.9 (H) 0.0 - 2.0 mmol/L   O2 Saturation 99.0 %   Patient temperature 94.9    Collection site RIGHT RADIAL    Drawn by [id number]    Sample type ARTERIAL DRAW    Allens test (pass/fail) PASS PASS    Comment: Performed at [facility], [place] [address] [place], [place] [address]  CBC     Status: None   Collection Time: 07/28/18  2:45 AM  Result Value Ref Range   WBC 6.9 4.0 - 10.5 K/uL   RBC 4.24 3.87 - 5.11 MIL/uL  Hemoglobin 12.9 12.0 - 15.0 g/dL   HCT 40.1 36.0 - 46.0 %   MCV 94.6 78.0 - 100.0 fL   MCH 30.4 26.0 - 34.0 pg   MCHC 32.2 30.0 - 36.0 g/dL   RDW 15.0 11.5 - 15.5 %   Platelets 353 150 - 400 K/uL    Comment: Performed at [facility], [place] [address], [place], [place] [address]  Basic metabolic panel     Status: Abnormal    Collection Time: 07/28/18  2:45 AM  Result Value Ref Range   Sodium 149 (H) 135 - 145 mmol/L   Potassium 3.5 3.5 - 5.1 mmol/L    Comment: DELTA CHECK NOTED REPEATED TO VERIFY NO VISIBLE HEMOLYSIS    Chloride 118 (H) 98 - 111 mmol/L   CO2 22 22 - 32 mmol/L   Glucose, Bld 97 70 - 99 mg/dL   BUN 12 6 - 20 mg/dL   Creatinine, Ser 0.90 0.44 - 1.00 mg/dL   Calcium 8.8 (L) 8.9 - 10.3 mg/dL   GFR calc non Af Amer >60 >60 mL/min   GFR calc Af Amer >60 >60 mL/min    Comment: (NOTE) The eGFR has been calculated using the CKD EPI equation. This calculation has not been validated in all clinical situations. eGFR's persistently <60 mL/min signify possible Chronic Kidney Disease.    Anion gap 9 5 - 15    Comment: Performed at [facility], [place] [address] [place], [place] [address]  Triglycerides     Status: Abnormal   Collection Time: 07/28/18  2:45 AM  Result Value Ref Range   Triglycerides 197 (H) <150 mg/dL    Comment: Performed at [facility], [place] [address], [place], [place] [address]  Magnesium     Status: None   Collection Time: 07/28/18  2:45 AM  Result Value Ref Range   Magnesium 1.9 1.7 - 2.4 mg/dL    Comment: Performed at [facility], [place] [address], [place], [place] [address]  MRSA PCR Screening     Status: None   Collection Time: 07/28/18  2:47 AM  Result Value Ref Range   MRSA by PCR NEGATIVE NEGATIVE    Comment:        The GeneXpert MRSA Assay (FDA approved for NASAL specimens only), is one component of a comprehensive MRSA colonization surveillance program. It is not intended to diagnose MRSA infection nor to guide or monitor treatment for MRSA infections. Performed at [facility], [place] [address] [place], [place] [address]   Glucose, capillary     Status: Abnormal   Collection Time: 07/28/18 12:47 PM  Result Value Ref Range   Glucose-Capillary 101 (H) 70 - 99 mg/dL  Glucose,  capillary     Status: Abnormal   Collection Time: 07/28/18  4:03 PM  Result Value Ref Range   Glucose-Capillary 111 (H) 70 - 99 mg/dL  Glucose, capillary     Status: Abnormal   Collection Time: 07/28/18  7:45 PM  Result Value Ref Range   Glucose-Capillary 110 (H) 70 - 99 mg/dL  Glucose, capillary     Status: Abnormal   Collection Time: 07/28/18 11:33 PM  Result Value Ref Range   Glucose-Capillary 104 (H) 70 - 99 mg/dL   Comment 1 Notify RN    Comment 2 Document in Chart   Glucose, capillary     Status: Abnormal   Collection Time: 07/29/18  3:06 AM  Result Value Ref Range  Glucose-Capillary 115 (H) 70 - 99 mg/dL  CBC     Status: Abnormal   Collection Time: 07/29/18  3:25 AM  Result Value Ref Range   WBC 12.5 (H) 4.0 - 10.5 K/uL   RBC 4.36 3.87 - 5.11 MIL/uL   Hemoglobin 13.4 12.0 - 15.0 g/dL   HCT 42.0 36.0 - 46.0 %   MCV 96.3 78.0 - 100.0 fL   MCH 30.7 26.0 - 34.0 pg   MCHC 31.9 30.0 - 36.0 g/dL   RDW 15.6 (H) 11.5 - 15.5 %   Platelets 317 150 - 400 K/uL    Comment: Performed at [facility], [place] [address] [place], [place] [address]  Basic metabolic panel     Status: Abnormal   Collection Time: 07/29/18  3:25 AM  Result Value Ref Range   Sodium 160 (H) 135 - 145 mmol/L    Comment: DELTA CHECK NOTED   Potassium 4.1 3.5 - 5.1 mmol/L   Chloride 125 (H) 98 - 111 mmol/L   CO2 26 22 - 32 mmol/L   Glucose, Bld 119 (H) 70 - 99 mg/dL   BUN 13 6 - 20 mg/dL   Creatinine, Ser 0.74 0.44 - 1.00 mg/dL   Calcium 9.0 8.9 - 10.3 mg/dL   GFR calc non Af Amer >60 >60 mL/min   GFR calc Af Amer >60 >60 mL/min    Comment: (NOTE) The eGFR has been calculated using the CKD EPI equation. This calculation has not been validated in all clinical situations. eGFR's persistently <60 mL/min signify possible Chronic Kidney Disease.    Anion gap 9 5 - 15    Comment: Performed at [facility], [place] [address], [place], [place] [address]  Phosphorus      Status: Abnormal   Collection Time: 07/29/18  3:25 AM  Result Value Ref Range   Phosphorus 2.4 (L) 2.5 - 4.6 mg/dL    Comment: Performed at [facility], [place] [address], [place], [place] [address]  Magnesium     Status: None   Collection Time: 07/29/18  3:25 AM  Result Value Ref Range   Magnesium 2.3 1.7 - 2.4 mg/dL    Comment: Performed at [facility], [place] [address] [place] [address] [place], [place] [address]  Lactic acid, plasma     Status: None   Collection Time: 07/29/18  3:28 AM  Result Value Ref Range   Lactic Acid, Venous 1.8 0.5 - 1.9 mmol/L    Comment: Performed at [facility], [place] [address], [place], [place] [address]  Glucose, capillary     Status: Abnormal   Collection Time: 07/29/18  7:40 AM  Result Value Ref Range   Glucose-Capillary 114 (H) 70 - 99 mg/dL  Basic metabolic panel     Status: Abnormal   Collection Time: 07/29/18 10:37 AM  Result Value Ref Range   Sodium 149 (H) 135 - 145 mmol/L   Potassium 3.8 3.5 - 5.1 mmol/L   Chloride 114 (H) 98 - 111 mmol/L   CO2 27 22 - 32 mmol/L   Glucose, Bld 128 (H) 70 - 99 mg/dL   BUN 14 6 - 20 mg/dL   Creatinine, Ser 0.67 0.44 - 1.00 mg/dL   Calcium 8.9 8.9 - 10.3 mg/dL   GFR calc non Af Amer >60 >60 mL/min   GFR calc Af Amer >60 >60 mL/min    Comment: (NOTE) The eGFR has been calculated using the CKD EPI equation. This calculation has  not been validated in all clinical situations. eGFR's persistently <60 mL/min signify possible Chronic Kidney Disease.    Anion gap 8 5 - 15    Comment: Performed at [facility], [place] [address] [place], [place] [address]  Glucose, capillary     Status: Abnormal   Collection Time: 07/29/18 11:56 AM  Result Value Ref Range   Glucose-Capillary 123 (H) 70 - 99 mg/dL    Current Facility-Administered Medications  Medication Dose Route Frequency Provider Last Rate Last Dose  . 0.9 %  sodium chloride infusion  250 mL  Intravenous PRN Flora Lipps, MD      . 0.9 %  sodium chloride infusion  250 mL Intravenous PRN Flora Lipps, MD      . chlorhexidine (PERIDEX) 0.12 % solution 15 mL  15 mL Mouth Rinse BID Brand Males, MD   15 mL at 07/29/18 1036  . chlorhexidine gluconate (MEDLINE KIT) (PERIDEX) 0.12 % solution 15 mL  15 mL Mouth Rinse BID Brand Males, MD   15 mL at 07/29/18 0744  . dextrose 5 % solution   Intravenous Continuous Brand Males, MD 50 mL/hr at 07/29/18 1033    . enoxaparin (LOVENOX) injection 40 mg  40 mg Subcutaneous Q24H Flora Lipps, MD   40 mg at 07/29/18 0924  . famotidine (PEPCID) tablet 20 mg  20 mg Oral QHS Brand Males, MD      . folic acid (FOLVITE) tablet 1 mg  1 mg Per Tube Daily Ollis, Brandi L, NP   1 mg at 07/29/18 0918  . free water 200 mL  200 mL Per Tube Q4H Brand Males, MD   200 mL at 07/29/18 1018  . MEDLINE mouth rinse  15 mL Mouth Rinse q12n4p Brand Males, MD      . midazolam (VERSED) injection 2 mg  2 mg Intravenous Q2H PRN Noe Gens L, NP   2 mg at 07/29/18 0257  . multivitamin liquid 15 mL  15 mL Per Tube Daily Ollis, Brandi L, NP   15 mL at 07/29/18 0920  . ondansetron (ZOFRAN) injection 4 mg  4 mg Intravenous Q6H PRN Flora Lipps, MD      . potassium PHOSPHATE 10 mmol in dextrose 5 % 250 mL infusion  10 mmol Intravenous Once Brand Males, MD 42 mL/hr at 07/29/18 1108 10 mmol at 07/29/18 1108  . sodium chloride flush (NS) 0.9 % injection 3 mL  3 mL Intravenous Q12H Flora Lipps, MD   3 mL at 07/29/18 0920  . sodium chloride flush (NS) 0.9 % injection 3 mL  3 mL Intravenous PRN Flora Lipps, MD      . thiamine (VITAMIN B-1) tablet 100 mg  100 mg Per Tube Daily Ollis, Brandi L, NP   100 mg at 07/29/18 7253    Musculoskeletal: Strength & Muscle Tone: within normal limits Gait & Station: UTA since lying in bed. Patient leans: N/A  Psychiatric Specialty Exam: Physical Exam  Nursing note and vitals reviewed. Constitutional: She  is oriented to person, place, and time. She appears well-developed and well-nourished.  HENT:  Head: Normocephalic and atraumatic.  Neck: Normal range of motion.  Respiratory: Effort normal.  Musculoskeletal: Normal range of motion.  Neurological: She is alert and oriented to person, place, and time.  Skin: No rash noted.  Psychiatric: Her speech is normal and behavior is normal. Judgment and thought content normal. Cognition and memory are normal. She exhibits a depressed mood.    Review of Systems  Constitutional: Negative for chills and fever.  Cardiovascular: Negative for chest pain.  Gastrointestinal: Negative for abdominal pain, constipation, diarrhea, nausea and vomiting.  Psychiatric/Behavioral: Positive for depression and substance abuse. Negative for hallucinations and suicidal ideas. The patient is nervous/anxious and has insomnia.   All other systems reviewed and are negative.   Blood pressure (!) 169/93, pulse 98, temperature 98.2 F (36.8 C), resp. rate 18, height _0  (1.651 m), weight 96.3 kg (212 lb 4.9 oz), SpO2 97 %.Body mass index is 35.33 kg/m.  General Appearance: Fairly Groomed, middle aged, Caucasian female with dyed orange hair and a hospital gown who is lying in bed. NAD.  Eye Contact:  Good  Speech:  Clear and Coherent and Normal Rate  Volume:  Normal although hoarse.  Mood:  Depressed  Affect:  Constricted  Thought Process:  Goal Directed, Linear and Descriptions of Associations: Intact  Orientation:  Full (Time, Place, and Person)  Thought Content:  Logical  Suicidal Thoughts:  No  Homicidal Thoughts:  No  Memory:  Immediate;   Good Recent;   Good Remote;   Good  Judgement:  Poor  Insight:  Poor  Psychomotor Activity:  Normal  Concentration:  Concentration: Good and Attention Span: Good  Recall:  Good  Fund of Knowledge:  Good  Language:  Good  Akathisia:  No  Handed:  Right  AIMS (if indicated):   N/A  Assets:  Communication  Skills Housing Physical Health Social Support  ADL's:  Intact  Cognition:  WNL  Sleep:   Fair   Assessment:  Stacie Sanders is a 47 y.o. female who was admitted with suspected suicide attempt by drug overdose. Patient denies suicide attempt and reports accidentally taking too much of her prescribed medications in setting of alcohol use. Stepsister reports that she sent her a text stating that she was going to harm herself. Patient reports depression in the setting of multiple psychosocial stressors. She has a history of multiple suicide attempts and last attempt was in June by overdose. She warrants inpatient psychiatric hospitalization for stabilization and treatment.   Treatment Plan Summary: -Patient warrants inpatient psychiatric hospitalization given high risk of harm to self. -Continue bedside sitter.  -Continue to hold psychotropic medications due to overdose.  -EKG reviewed and QTc 462 (down from 509 yesterday). Please closely monitor when starting or increasing QTc prolonging agents. -Please pursue involuntary commitment if patient refuses voluntary psychiatric hospitalization or attempts to leave the hospital.  -Will sign off on patient at this time. Please consult psychiatry again as needed.     Disposition: Recommend psychiatric Inpatient admission when medically cleared.  Faythe Dingwall, DO 07/29/2018 1:08 PM

## 2018-07-29 NOTE — Progress Notes (Signed)
LCSW called to IVC patient. At this time patient is not threatening to leave AMA.  Per attending patient has a history of signing out AMA. Patient has psych consult.   LCSW will not IVC at this time as it is not appropriate. If patient begins to threaten to leave LCSW will revisit IVC.   Beulah GandyBernette Darrick Greenlaw, LSCW [facility] [phone]

## 2018-07-29 NOTE — Progress Notes (Signed)
Denise with [employer] has called for an update

## 2018-07-30 ENCOUNTER — Encounter (HOSPITAL_COMMUNITY): Payer: Self-pay

## 2018-07-30 ENCOUNTER — Inpatient Hospital Stay (HOSPITAL_COMMUNITY)
Admission: AD | Admit: 2018-07-30 | Discharge: 2018-08-03 | DRG: 885 | Disposition: A | Discharge status: Home / Self Care | Payer: Federal, State, Local not specified - Other | Source: Intra-hospital | Attending: Psychiatry | Admitting: Psychiatry

## 2018-07-30 ENCOUNTER — Other Ambulatory Visit: Payer: Self-pay

## 2018-07-30 DIAGNOSIS — Z79899 Other long term (current) drug therapy: Secondary | ICD-10-CM

## 2018-07-30 DIAGNOSIS — Z915 Personal history of self-harm: Secondary | ICD-10-CM | POA: Diagnosis not present

## 2018-07-30 DIAGNOSIS — F3160 Bipolar disorder, current episode mixed, unspecified: Principal | ICD-10-CM | POA: Diagnosis present

## 2018-07-30 DIAGNOSIS — Z818 Family history of other mental and behavioral disorders: Secondary | ICD-10-CM | POA: Diagnosis not present

## 2018-07-30 DIAGNOSIS — F319 Bipolar disorder, unspecified: Secondary | ICD-10-CM | POA: Diagnosis present

## 2018-07-30 DIAGNOSIS — F3130 Bipolar disorder, current episode depressed, mild or moderate severity, unspecified: Secondary | ICD-10-CM | POA: Diagnosis not present

## 2018-07-30 DIAGNOSIS — F17210 Nicotine dependence, cigarettes, uncomplicated: Secondary | ICD-10-CM | POA: Diagnosis present

## 2018-07-30 DIAGNOSIS — T1491XA Suicide attempt, initial encounter: Secondary | ICD-10-CM

## 2018-07-30 LAB — GLUCOSE, CAPILLARY
GLUCOSE-CAPILLARY: 119 mg/dL — AB (ref 70–99)
Glucose-Capillary: 122 mg/dL — ABNORMAL HIGH (ref 70–99)
Glucose-Capillary: 104 mg/dL — ABNORMAL HIGH (ref 70–99)
Glucose-Capillary: 109 mg/dL — ABNORMAL HIGH (ref 70–99)

## 2018-07-30 LAB — PHOSPHORUS: PHOSPHORUS: 2.4 mg/dL — AB (ref 2.5–4.6)

## 2018-07-30 LAB — CBC WITH DIFFERENTIAL/PLATELET
BASOS PCT: 0 %
Basophils Absolute: 0 10*3/uL (ref 0.0–0.1)
EOS ABS: 0.3 10*3/uL (ref 0.0–0.7)
Eosinophils Relative: 3 %
HCT: 40.1 % (ref 36.0–46.0)
HEMOGLOBIN: 13 g/dL (ref 12.0–15.0)
Lymphocytes Relative: 26 %
Lymphs Abs: 2.5 10*3/uL (ref 0.7–4.0)
MCH: 30.7 pg (ref 26.0–34.0)
MCHC: 32.4 g/dL (ref 30.0–36.0)
MCV: 94.8 fL (ref 78.0–100.0)
Monocytes Absolute: 0.5 10*3/uL (ref 0.1–1.0)
Monocytes Relative: 5 %
NEUTROS PCT: 66 %
Neutro Abs: 6.2 10*3/uL (ref 1.7–7.7)
Platelets: 370 10*3/uL (ref 150–400)
RBC: 4.23 MIL/uL (ref 3.87–5.11)
RDW: 14.9 % (ref 11.5–15.5)
WBC: 9.5 10*3/uL (ref 4.0–10.5)

## 2018-07-30 LAB — BASIC METABOLIC PANEL
Anion gap: 9 (ref 5–15)
BUN: 11 mg/dL (ref 6–20)
CALCIUM: 9.1 mg/dL (ref 8.9–10.3)
CO2: 26 mmol/L (ref 22–32)
CREATININE: 0.72 mg/dL (ref 0.44–1.00)
Chloride: 109 mmol/L (ref 98–111)
GFR calc Af Amer: >60 mL/min (ref >60)
GFR calc non Af Amer: >60 mL/min (ref >60)
GLUCOSE: 128 mg/dL — AB (ref 70–99)
Potassium: 3.6 mmol/L (ref 3.5–5.1)
Sodium: 144 mmol/L (ref 135–145)

## 2018-07-30 LAB — HEPATIC FUNCTION PANEL
ALT: 21 U/L (ref 0–44)
AST: 17 U/L (ref 15–41)
Albumin: 3.5 g/dL (ref 3.5–5.0)
Alkaline Phosphatase: 78 U/L (ref 38–126)
BILIRUBIN INDIRECT: 0.5 mg/dL (ref 0.3–0.9)
Bilirubin, Direct: 0.1 mg/dL (ref 0.0–0.2)
TOTAL PROTEIN: 7 g/dL (ref 6.5–8.1)
Total Bilirubin: 0.6 mg/dL (ref 0.3–1.2)

## 2018-07-30 LAB — TROPONIN I

## 2018-07-30 LAB — PROTIME-INR
INR: 0.93
Prothrombin Time: 12.3 seconds (ref 11.4–15.2)

## 2018-07-30 LAB — MAGNESIUM: Magnesium: 2.1 mg/dL (ref 1.7–2.4)

## 2018-07-30 MED ORDER — HYDROCORTISONE 1 % EX CREA
TOPICAL_CREAM | Freq: Two times a day (BID) | CUTANEOUS | Status: DC
  Filled 2018-07-30: qty 28

## 2018-07-30 MED ORDER — IBUPROFEN 400 MG PO TABS
400.0000 mg | ORAL_TABLET | Freq: Four times a day (QID) | ORAL | Status: DC | PRN
  Administered 2018-08-02: 400 mg via ORAL
  Filled 2018-07-30: qty 1

## 2018-07-30 MED ORDER — NICOTINE POLACRILEX 2 MG MT GUM
2.0000 mg | CHEWING_GUM | OROMUCOSAL | Status: DC | PRN
  Administered 2018-07-31 – 2018-08-02 (×6): 2 mg via ORAL
  Filled 2018-07-30 (×3): qty 1

## 2018-07-30 MED ORDER — ALUM & MAG HYDROXIDE-SIMETH 200-200-20 MG/5ML PO SUSP
30.0000 mL | ORAL | Status: DC | PRN
  Administered 2018-08-01 – 2018-08-02 (×2): 30 mL via ORAL
  Filled 2018-07-30 (×2): qty 30

## 2018-07-30 MED ORDER — TRAZODONE HCL 50 MG PO TABS
50.0000 mg | ORAL_TABLET | Freq: Every evening | ORAL | Status: DC | PRN
  Administered 2018-07-30 – 2018-07-31 (×2): 50 mg via ORAL
  Filled 2018-07-30 (×2): qty 1

## 2018-07-30 MED ORDER — HYDROXYZINE HCL 25 MG PO TABS
25.0000 mg | ORAL_TABLET | Freq: Three times a day (TID) | ORAL | Status: DC | PRN
  Administered 2018-07-30 – 2018-07-31 (×4): 25 mg via ORAL
  Filled 2018-07-30 (×5): qty 1

## 2018-07-30 MED ORDER — MAGNESIUM HYDROXIDE 400 MG/5ML PO SUSP: 30.0000 mL | Freq: Every day | ORAL | Status: DC | PRN

## 2018-07-30 NOTE — Progress Notes (Signed)
LCSW called by RN to bring IVC paperwork to have on hand just in case patient tries to leave AMA. LCSW explained that patient must be actively attempting to leave to complete IVC paperwork.   If patient attempts to leave AMA LCSW will initiate IVC paperwork.   Stacie GandyBernette Brayli Klingbeil, LSCW [facility] [phone]

## 2018-07-30 NOTE — Progress Notes (Signed)
LCSW following for inpatient psych placement once patient is medically stable.

## 2018-07-30 NOTE — Tx Team (Signed)
Initial Treatment Plan 07/30/2018 10:37 PM Stacie Sanders [id number]    PATIENT STRESSORS: Financial difficulties Marital or family conflict Substance abuse   PATIENT STRENGTHS: Active sense of humor Average or above average intelligence Capable of independent living General fund of knowledge Motivation for treatment/growth   PATIENT IDENTIFIED PROBLEMS: "pick myself up and get myself together"  "I really wasn't trying to kill myself"                   DISCHARGE CRITERIA:  Improved stabilization in mood, thinking, and/or behavior Reduction of life-threatening or endangering symptoms to within safe limits Verbal commitment to aftercare and medication compliance  PRELIMINARY DISCHARGE PLAN: Attend aftercare/continuing care group Outpatient therapy Return to previous living arrangement  PATIENT/FAMILY INVOLVEMENT: This treatment plan has been presented to and reviewed with the patient, Stacie Sanders, and/or family member, .  The patient and family have been given the opportunity to ask questions and make suggestions.  Andrena Mewsuttall, Cire Deyarmin J, RN 07/30/2018, 10:37 PM
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
## 2018-07-30 NOTE — Progress Notes (Signed)
Patient has bed at [facility].   Patient will go to Room 303 bed 2.   Accepting: Stacie Sanders  Attending: Tamera Puntleary  Patient can transport at 8pm. Unit RN will need to set up [facility] [phone].   RN report number: [phone]   Please call report before patient transports.  Originally voluntary form goes with patient.   Beulah GandyBernette Jese Comella, LSCW [facility] [phone]

## 2018-07-30 NOTE — Progress Notes (Signed)
Stacie Sanders   is medically stable at this time  Patient has been cleared for transfer to inpatient psychiatric unit when psychiatric bed becomes available  Shon Haleourage Felicite Zeimet, MD

## 2018-07-31 DIAGNOSIS — F3130 Bipolar disorder, current episode depressed, mild or moderate severity, unspecified: Secondary | ICD-10-CM

## 2018-07-31 LAB — LITHIUM LEVEL: Lithium Lvl: <0.06 mmol/L — ABNORMAL LOW (ref 0.60–1.20)

## 2018-07-31 MED ORDER — TRAZODONE HCL 50 MG PO TABS
50.0000 mg | ORAL_TABLET | Freq: Every evening | ORAL | Status: DC | PRN
  Administered 2018-07-31: 50 mg via ORAL
  Filled 2018-07-31: qty 1

## 2018-07-31 MED ORDER — DM-GUAIFENESIN ER 30-600 MG PO TB12
1.0000 | ORAL_TABLET | Freq: Two times a day (BID) | ORAL | Status: DC
  Administered 2018-07-31 – 2018-08-03 (×6): 1 via ORAL
  Filled 2018-07-31 (×9): qty 1

## 2018-07-31 MED ORDER — LORAZEPAM 1 MG PO TABS
1.0000 mg | ORAL_TABLET | Freq: Four times a day (QID) | ORAL | Status: DC | PRN
  Administered 2018-08-01: 1 mg via ORAL
  Filled 2018-07-31: qty 1

## 2018-07-31 MED ORDER — PAROXETINE HCL 10 MG PO TABS
10.0000 mg | ORAL_TABLET | Freq: Every day | ORAL | Status: DC
  Administered 2018-07-31 – 2018-08-03 (×4): 10 mg via ORAL
  Filled 2018-07-31 (×3): qty 1
  Filled 2018-07-31: qty 7
  Filled 2018-07-31 (×2): qty 1

## 2018-07-31 NOTE — H&P (Signed)
Psychiatric Admission Assessment Adult  Patient Identification: Stacie Sanders MRN:  [id number] Date of Evaluation:  07/31/2018 Chief Complaint:  bipolar disorder  Principal Diagnosis: Bipolar disorder, unspecified ([place]) Diagnosis:   Patient Active Problem List   Diagnosis Date Noted  . Bipolar disorder, unspecified ([place]) [F31.9] 07/30/2018  . Respiratory failure ([place]) [J96.90] 07/28/2018  . Cocaine use disorder, mild, abuse ([place]) [F14.10]   . Alcohol intoxication delirium with mild use disorder ([place]) [F10.121]   . Bipolar disorder ([place]) [F31.9] 06/18/2018  . Suicide attempt ([place]) [T14.91XA]   . Fatigue [R53.83] 03/01/2018  . Irritability [R45.4] 03/01/2018  . GAD (generalized anxiety disorder) [F41.1] 03/01/2018  . Gingivitis [K05.10] 03/01/2018  . Grinding of teeth [F45.8] 03/01/2018  . Healthcare maintenance [Z00.00] 02/15/2018  . Depression [F32.9] 02/15/2018  . Bipolar 1 disorder ([place]) [F31.9] 02/15/2018  . Cough in adult [R05] 02/15/2018  . Acute bacterial conjunctivitis of right eye [H10.31] 02/15/2018  . Acute respiratory failure ([place]) [J96.00] 01/03/2016  . Acute respiratory failure with hypoxemia ([place]) [J96.01]   . Acute encephalopathy [G93.40]   . Drug overdose [T50.901A] 12/29/2015  . Overdose [T50.901A] 12/29/2015   History of Present Illness:  07/29/18 [facility] MD Consult: Per chart review, patient was admitted with suspected drug overdose. Her family reports that she has been increasingly depressed and sent them a text around 8 pm that stated she wanted to die the day of admission. She was found unresponsive at home by her sons. Her family presumes that she overdosed.  EMS found alcohol and pill bottles at her residence. She required intubation for airway protection. She was extubated today. BAL was 169 and UDS was negative on admission.  Of note, patient was discharged from [facility] on 6/28. She was treated for suicide attempt by drug overdose in the setting of her husband  being unfaithful. She was found at home unresponsive with empty bill bottles of Buspar, Gabapentin, Paxil, Seroquel and Atarax. There were also beer bottles present. She continued to deny suicide attempt throughout hospitalization and reported confusing her pill packets since her doctor recently changed the package. Discharge medications included Seroquel 500 mg qhs, Paxil 20 mg qhs, Wellbutrin 100 mg daily, Gabapentin 400 mg TID and Atarax 25 mg q 6 hours PRN. She was scheduled followed with [facility].  On interview, [name] denies SI and recent suicide attempt.  She reports that she got her medication confused due to drinking alcohol.  She reports that she asked her family to come help her sort her medications because they were still in the packs that they shipped in.  She believes that she took more medications than she was prescribed.  She reports likely taking a double dose of Seroquel and Gabapentin.  She has been drinking more in order to cope with her stress.  She is now separated from her husband and he is living with his girlfriend in [place].  She reports," I would not give that son of a bitch everything he wants" when referring to her husband."  She reports feeling angry that the house may be foreclosed.  She also reports feelings of anger towards her children.  She believes that they have went to her house to sort through her belongings because they think she is dead from her suicide attempt.  She reports hearing this information from a friend.  She reports financial stressors and plans to look for employment.  She denies HI or AVH.  She reports fluctuations in her appetite although her weight has remained  stable.  She reports decreased sleep time (4 to 6 hours nightly versus her usual 7-8 hours nightly).  She reports medication compliance.  She has not followed [facility] with [facility] since discharge from [facility].  She admits to overdosing the last time she was admitted to the hospital although she denied  it throughout her [facility] admission. She reports that her last manic episode was in 2015/03/07 after her grandson died by drowning in a pool. Patient's stepsister was contacted by phone with her verbal consent.  Her stepsister reports that her mood fluctuates.  She attempted suicide 2 weeks ago.  She grabbed a screwdriver and stated, "I am done."  She told her stepsister that she was going to the woods where no one would find her.  Her stepsister called the cops but she was not brought to the hospital because they felt like she was okay.  Her stepsister reports that she is able to present well to others.  She told her stepsister that she would "end it in one of two ways."  She reported that she would kill her husband, his girlfriend and then herself.  She sent her a text last night and stated, "I quit. I'm done. When I get home I'm dead."  Her stepsister found pills scattered all over her house when she arrived last night.  She has been telling others information that is not true but might believe it. She has most recently been telling others that she had a miscarriage and was seen rocking her hands back and forth as if she was cradling a baby last night.   On evaluation today: Patient presents today walking around the unit and is very pleasant and cooperative.  Patient states that this was not an intentional suicide attempt and that she had been drinking and mistakenly retook her medications.  It was noticed that her UDS was negative and she was very proud that she has not used any drugs, but she has been using alcohol but not excessively.  Patient states that she does have a very supportive family and that she has a court date on Monday and a job interview on Tuesday that she would like to attend.  Patient states that this was completely unintentional and she had no intentions of hurting herself.  When asked about the comments that she had made to her family about wanting to die she stated that she was intoxicated and it  was just the conversation there was no attention behind it.  There is concern that patient makes impulsive and irrational decisions when she is intoxicated and this may be what she had overdosed on the medications.  Patient is minimizing the severity of this overdose as notes indicate that patient was attempted to be intubated but due to potential blockage they were unable to.  However, patient was able to regain responsiveness and has been acting appropriately on the unit so far.  Patient is open to be discharged very soon.   Associated Signs/Symptoms: Depression Symptoms:  depressed mood, insomnia, hopelessness, recurrent thoughts of death, anxiety, loss of energy/fatigue, disturbed sleep, (Hypo) Manic Symptoms:  Denies Anxiety Symptoms:  Excessive Worry, Psychotic Symptoms:  Denies PTSD Symptoms: NA Total Time spent with patient: 45 minutes  Past Psychiatric History: Bipolar I, Goes to [place] regularly, Hospitalized in 03/07/15 for overdose as a suicide attempt after grandson died, intentional overdose in 06-06-2018   Is the patient at risk to self? Yes.    Has the patient been a risk to  self in the past 6 months? Yes.    Has the patient been a risk to self within the distant past? Yes.    Is the patient a risk to others? No.  Has the patient been a risk to others in the past 6 months? No.  Has the patient been a risk to others within the distant past? No.   Prior Inpatient Therapy:   Prior Outpatient Therapy:    Alcohol Screening: 1. How often do you have a drink containing alcohol?: Monthly or less 2. How many drinks containing alcohol do you have on a typical day when you are drinking?: 3 or 4 3. How often do you have six or more drinks on one occasion?: Never AUDIT-C Score: 2 4. How often during the last year have you found that you were not able to stop drinking once you had started?: Never 5. How often during the last year have you failed to do what was normally expected from  you becasue of drinking?: Never 6. How often during the last year have you needed a first drink in the morning to get yourself going after a heavy drinking session?: Never 7. How often during the last year have you had a feeling of guilt of remorse after drinking?: Less than monthly 8. How often during the last year have you been unable to remember what happened the night before because you had been drinking?: Less than monthly 9. Have you or someone else been injured as a result of your drinking?: Yes, during the last year 10. Has a relative or friend or a doctor or another health worker been concerned about your drinking or suggested you cut down?: No Alcohol Use Disorder Identification Test Final Score (AUDIT): 8 Intervention/Follow-up: Alcohol Education Substance Abuse History in the last 12 months:  Yes.   Consequences of Substance Abuse: Medical Consequences:  reviewed Legal Consequences:  reviewed Family Consequences:  reviewed Previous Psychotropic Medications: Yes  Psychological Evaluations: Yes  Past Medical History:  Past Medical History:  Diagnosis Date  . Allergic rhinitis   . Bipolar 1 disorder ([place])   . Borderline diabetes mellitus   . Colon polyp   . Depression   . Dyspnea on exertion   . Esophageal stricture    scheduled for EGD and dilation 03-18-2016  . GAD (generalized anxiety disorder)   . History of acute respiratory failure    12-29-2015  due to polysubstance overdose--  pt intubated --  resolved   . History of febrile seizure    x1  01/ 2017--  none since  . History of kidney stones    st[place]  . History of suicide attempt    12-29-2015  -- overdose klonopin, xanax, alcohol--  acute respirtory failure and acute encenphalopathy -- both resolved  . IBS (irritable bowel syndrome)   . Irregular menstrual cycle   . Renal calculi    bilateral    Past Surgical History:  Procedure Laterality Date  . COLONOSCOPY    . CYSTOSCOPY WITH STENT PLACEMENT Bilateral  03/10/2016   Procedure: CYSTOSCOPY WITH STENT PLACEMENT;  Surgeon: Cleon Gustin, MD;  Location: [facility];  Service: Urology;  Laterality: Bilateral;  . CYSTOSCOPY/RETROGRADE/URETEROSCOPY/STONE EXTRACTION WITH BASKET Bilateral 03/10/2016   Procedure: CYSTOSCOPY/RETROGRADE/URETEROSCOPY/STONE EXTRACTION WITH BASKET;  Surgeon: Cleon Gustin, MD;  Location: [facility];  Service: Urology;  Laterality: Bilateral;  . EXTRACORPOREAL SHOCK WAVE LITHOTRIPSY  x2    . PERCUTANEOUS NEPHROSTOLITHOTOMY Right 09-18-2005   st[place]  .  TUBAL LIGATION  1999   Family History:  Family History  Problem Relation Age of Onset  . Cancer Father        pancreas  . Melanoma Father   . Cancer Maternal Grandmother        lung  . COPD Paternal Grandmother   . Melanoma Paternal Grandmother    Family Psychiatric  History: Dad- schizophrenia and PTSD  Tobacco Screening: Have you used any form of tobacco in the last 30 days? (Cigarettes, Smokeless Tobacco, Cigars, and/or Pipes): Yes Tobacco use, Select all that apply: 5 or more cigarettes per day Are you interested in Tobacco Cessation Medications?: No, patient refused Counseled patient on smoking cessation including recognizing danger situations, developing coping skills and basic information about quitting provided: Refused/Declined practical counseling Social History:  Social History   Substance and Sexual Activity  Alcohol Use Yes   Comment: occ     Social History   Substance and Sexual Activity  Drug Use No    Additional Social History:      Pain Medications: see mar Prescriptions: see mar Over the Counter: see mar History of alcohol / drug use?: Yes Longest period of sobriety (when/how long): unkwon Negative Consequences of Use: Legal                    Allergies:   Allergies  Allergen Reactions  . Fentanyl Hives and Itching  . Percocet [Oxycodone-Acetaminophen] Hives and Itching  . Robaxin  [Methocarbamol] Rash   Lab Results:  Results for orders placed or performed during the hospital encounter of 07/27/18 (from the past 48 hour(s))  Basic metabolic panel     Status: Abnormal   Collection Time: 07/29/18 10:37 AM  Result Value Ref Range   Sodium 149 (H) 135 - 145 mmol/L   Potassium 3.8 3.5 - 5.1 mmol/L   Chloride 114 (H) 98 - 111 mmol/L   CO2 27 22 - 32 mmol/L   Glucose, Bld 128 (H) 70 - 99 mg/dL   BUN 14 6 - 20 mg/dL   Creatinine, Ser 0.67 0.44 - 1.00 mg/dL   Calcium 8.9 8.9 - 10.3 mg/dL   GFR calc non Af Amer >60 >60 mL/min   GFR calc Af Amer >60 >60 mL/min    Comment: (NOTE) The eGFR has been calculated using the CKD EPI equation. This calculation has not been validated in all clinical situations. eGFR's persistently <60 mL/min signify possible Chronic Kidney Disease.    Anion gap 8 5 - 15    Comment: Performed at [facility], [place] [address] [place], [place] [address]  Glucose, capillary     Status: Abnormal   Collection Time: 07/29/18 11:56 AM  Result Value Ref Range   Glucose-Capillary 123 (H) 70 - 99 mg/dL  Glucose, capillary     Status: Abnormal   Collection Time: 07/29/18  3:06 PM  Result Value Ref Range   Glucose-Capillary 145 (H) 70 - 99 mg/dL   Comment 1 Notify RN    Comment 2 Document in Chart   Basic metabolic panel     Status: Abnormal   Collection Time: 07/29/18  5:47 PM  Result Value Ref Range   Sodium 146 (H) 135 - 145 mmol/L   Potassium 3.5 3.5 - 5.1 mmol/L   Chloride 110 98 - 111 mmol/L   CO2 30 22 - 32 mmol/L   Glucose, Bld 93 70 - 99 mg/dL   BUN 12 6 - 20 mg/dL   Creatinine,  Ser 0.72 0.44 - 1.00 mg/dL   Calcium 8.9 8.9 - 10.3 mg/dL   GFR calc non Af Amer >60 >60 mL/min   GFR calc Af Amer >60 >60 mL/min    Comment: (NOTE) The eGFR has been calculated using the CKD EPI equation. This calculation has not been validated in all clinical situations. eGFR's persistently <60 mL/min signify possible Chronic  Kidney Disease.    Anion gap 6 5 - 15    Comment: Performed at [facility], [place] [address], [place], [place] [address]  Glucose, capillary     Status: Abnormal   Collection Time: 07/29/18  8:23 PM  Result Value Ref Range   Glucose-Capillary 125 (H) 70 - 99 mg/dL  Basic metabolic panel     Status: Abnormal   Collection Time: 07/30/18  6:02 AM  Result Value Ref Range   Sodium 144 135 - 145 mmol/L   Potassium 3.6 3.5 - 5.1 mmol/L   Chloride 109 98 - 111 mmol/L   CO2 26 22 - 32 mmol/L   Glucose, Bld 128 (H) 70 - 99 mg/dL   BUN 11 6 - 20 mg/dL   Creatinine, Ser 0.72 0.44 - 1.00 mg/dL   Calcium 9.1 8.9 - 10.3 mg/dL   GFR calc non Af Amer >60 >60 mL/min   GFR calc Af Amer >60 >60 mL/min    Comment: (NOTE) The eGFR has been calculated using the CKD EPI equation. This calculation has not been validated in all clinical situations. eGFR's persistently <60 mL/min signify possible Chronic Kidney Disease.    Anion gap 9 5 - 15    Comment: Performed at [facility], [place] [address] [place], [place] [address]  Magnesium     Status: None   Collection Time: 07/30/18  6:02 AM  Result Value Ref Range   Magnesium 2.1 1.7 - 2.4 mg/dL    Comment: Performed at [facility], [place] [address] [place], [place] [address]  CBC with Differential/Platelet     Status: None   Collection Time: 07/30/18  6:02 AM  Result Value Ref Range   WBC 9.5 4.0 - 10.5 K/uL   RBC 4.23 3.87 - 5.11 MIL/uL   Hemoglobin 13.0 12.0 - 15.0 g/dL   HCT 40.1 36.0 - 46.0 %   MCV 94.8 78.0 - 100.0 fL   MCH 30.7 26.0 - 34.0 pg   MCHC 32.4 30.0 - 36.0 g/dL   RDW 14.9 11.5 - 15.5 %   Platelets 370 150 - 400 K/uL   Neutrophils Relative % 66 %   Neutro Abs 6.2 1.7 - 7.7 K/uL   Lymphocytes Relative 26 %   Lymphs Abs 2.5 0.7 - 4.0 K/uL   Monocytes Relative 5 %   Monocytes Absolute 0.5 0.1 - 1.0 K/uL   Eosinophils Relative 3 %   Eosinophils Absolute 0.3 0.0 - 0.7 K/uL    Basophils Relative 0 %   Basophils Absolute 0.0 0.0 - 0.1 K/uL    Comment: Performed at [facility], [place] [address] [place], [place] [address]  Phosphorus     Status: Abnormal   Collection Time: 07/30/18  6:02 AM  Result Value Ref Range   Phosphorus 2.4 (L) 2.5 - 4.6 mg/dL    Comment: Performed at [facility], [place] [address], [place],  [address]  Hepatic function panel     Status: None   Collection Time: 07/30/18  6:02 AM  Result Value Ref Range  Total Protein 7.0 6.5 - 8.1 g/dL   Albumin 3.5 3.5 - 5.0 g/dL   AST 17 15 - 41 U/L   ALT 21 0 - 44 U/L   Alkaline Phosphatase 78 38 - 126 U/L   Total Bilirubin 0.6 0.3 - 1.2 mg/dL   Bilirubin, Direct 0.1 0.0 - 0.2 mg/dL   Indirect Bilirubin 0.5 0.3 - 0.9 mg/dL    Comment: Performed at [facility], [place] [address], [place], [place] [address]  Protime-INR     Status: None   Collection Time: 07/30/18  6:02 AM  Result Value Ref Range   Prothrombin Time 12.3 11.4 - 15.2 seconds   INR 0.93     Comment: Performed at [facility], [place] [address], [place], [place] [address]  Troponin I     Status: None   Collection Time: 07/30/18  6:02 AM  Result Value Ref Range   Troponin I <0.03 <0.03 ng/mL    Comment: Performed at [facility], [place] [address] [place], [place] [address]  Glucose, capillary     Status: Abnormal   Collection Time: 07/30/18  7:48 AM  Result Value Ref Range   Glucose-Capillary 122 (H) 70 - 99 mg/dL  Glucose, capillary     Status: Abnormal   Collection Time: 07/30/18 12:09 PM  Result Value Ref Range   Glucose-Capillary 104 (H) 70 - 99 mg/dL  Glucose, capillary     Status: Abnormal   Collection Time: 07/30/18  3:23 PM  Result Value Ref Range   Glucose-Capillary 109 (H) 70 - 99 mg/dL  Glucose, capillary     Status: Abnormal   Collection Time: 07/30/18  7:43 PM  Result Value Ref Range   Glucose-Capillary 119 (H) 70 - 99  mg/dL    Blood Alcohol level:  Lab Results  Component Value Date   ETH 169 (H) 07/27/2018   ETH 12 (H) 23/53/6144    Metabolic Disorder Labs:  Lab Results  Component Value Date   HGBA1C 5.6 06/25/2018   MPG 114.02 06/25/2018   Lab Results  Component Value Date   PROLACTIN 10.1 06/25/2018   Lab Results  Component Value Date   CHOL 254 (H) 06/25/2018   TRIG 197 (H) 07/28/2018   HDL 61 06/25/2018   CHOLHDL 4.2 06/25/2018   VLDL 47 (H) 06/25/2018   LDLCALC 146 (H) 06/25/2018   LDLCALC 90 03/01/2018    Current Medications: Current Facility-Administered Medications  Medication Dose Route Frequency Provider Last Rate Last Dose  . alum & mag hydroxide-simeth (MAALOX/MYLANTA) 200-200-20 MG/5ML suspension 30 mL  30 mL Oral Q4H PRN Katalyna Socarras, Lowry Ram, FNP      . hydrOXYzine (ATARAX/VISTARIL) tablet 25 mg  25 mg Oral TID PRN Ashten Sarnowski, Lowry Ram, FNP   25 mg at 07/30/18 2311  . ibuprofen (ADVIL,MOTRIN) tablet 400 mg  400 mg Oral Q6H PRN Sabrin Dunlevy, Lowry Ram, FNP      . magnesium hydroxide (MILK OF MAGNESIA) suspension 30 mL  30 mL Oral Daily PRN Wise Fees, Lowry Ram, FNP      . nicotine polacrilex (NICORETTE) gum 2 mg  2 mg Oral PRN Laverle Hobby, PA-C      . traZODone (DESYREL) tablet 50 mg  50 mg Oral QHS PRN Pooja Camuso, Lowry Ram, FNP   50 mg at 07/30/18 2311   PTA Medications: No medications prior to admission.    Musculoskeletal: Strength & Muscle Tone: within normal limits Gait & Station: normal Patient leans: N/A  Psychiatric Specialty Exam: Physical Exam  Nursing note and vitals reviewed. Constitutional: She is oriented to person, place, and time. She appears well-developed and well-nourished.  Cardiovascular: Normal rate.  Respiratory: Effort normal.  Musculoskeletal: Normal range of motion.  Neurological: She is alert and oriented to person, place, and time.  Skin: Skin is warm.    Review of Systems  Constitutional: Negative.   HENT: Negative.   Eyes: Negative.   Respiratory:  Negative.   Cardiovascular: Negative.   Gastrointestinal: Negative.   Genitourinary: Negative.   Musculoskeletal: Negative.   Skin: Negative.   Neurological: Negative.   Endo/Heme/Allergies: Negative.   Psychiatric/Behavioral: Positive for depression. The patient is nervous/anxious and has insomnia.     Blood pressure 113/82, pulse 64, temperature 98.5 F (36.9 C), temperature source Oral, resp. rate 18, height '5\' 5"'$  (1.651 m), weight 94.3 kg (208 lb).Body mass index is 34.61 kg/m.  General Appearance: Disheveled  Eye Contact:  Good  Speech:  Clear and Coherent and Normal Rate  Volume:  Normal  Mood:  Euthymic  Affect:  Flat  Thought Process:  Goal Directed and Descriptions of Associations: Intact  Orientation:  Full (Time, Place, and Person)  Thought Content:  WDL  Suicidal Thoughts:  No  Homicidal Thoughts:  No  Memory:  Immediate;   Good Recent;   Good Remote;   Good  Judgement:  Fair  Insight:  Fair  Psychomotor Activity:  Normal  Concentration:  Concentration: Good and Attention Span: Good  Recall:  Good  Fund of Knowledge:  Good  Language:  Good  Akathisia:  No  Handed:  Right  AIMS (if indicated):     Assets:  Communication Skills Desire for Improvement Housing Resilience Social Support Transportation  ADL's:  Intact  Cognition:  WNL  Sleep:  Number of Hours: 3.5    Treatment Plan Summary: Daily contact with patient to assess and evaluate symptoms and progress in treatment, Medication management and Plan is to:  -See MAR and SRA for medication management -Encourage group therapy participation  Observation Level/Precautions:  15 minute checks  Laboratory:  Reviewed  Psychotherapy:    Medications:    Consultations:    Discharge Concerns:    Estimated LOS:  Other:     Physician Treatment Plan for Primary Diagnosis: Bipolar disorder, unspecified ([place]) Long Term Goal(s): Improvement in symptoms so as ready for discharge  Short Term Goals: Ability to  disclose and discuss suicidal ideas, Ability to demonstrate self-control will improve and Ability to identify and develop effective coping behaviors will improve  Physician Treatment Plan for Secondary Diagnosis: Principal Problem:   Bipolar disorder, unspecified ([place])  Long Term Goal(s): Improvement in symptoms so as ready for discharge  Short Term Goals: Ability to identify and develop effective coping behaviors will improve, Ability to maintain clinical measurements within normal limits will improve and Compliance with prescribed medications will improve  I certify that inpatient services furnished can reasonably be expected to improve the patient's condition.    Lewis Shock, FNP 8/3/20199:33 AM

## 2018-07-31 NOTE — Progress Notes (Addendum)
Patient ID: Stacie Sanders, female   DOB: 11-Dec-1971, 47 y.o.   MRN: [id number]  Patient wrote on a piece of paper and handed to staff: "(peers) are going to go head to head before it's over" and handed paper to staff. Staff has been present in the dayroom and rounding. Peers deny conflict. Patient is malingering around the nurse's station stating, "I don't feel safe here, I need to go back to the ED". Patient assured her safety is staff main priority. All peers are socializing in the dayroom in no distress.  Patient asked to speak to writer 1:1. Patient completed patient satisfaction survey and stated, "you need to figure out how to get me out of here or it's going to get bad". Patient asked staff, "if I have to go to the ED, they could send me to another mental hospital right? I want to go somewhere else". Patient stated, "you're not a medical hospital, so you'd have to send me out". Patient again assured her safety is staff main priority. Emotional support provided.   Patient encouraged to focus on her goals for therapy and recovery. Patient vitals obtained and WNL. Patient informed she can rest in her room or the dayroom. Patient is anxious but is safe on the unit at this time.
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
## 2018-07-31 NOTE — Progress Notes (Signed)
Writer spoke 1:1 with patient at length tonight. She spoke about what she is currently dealing with involving her husband and how angry she gets when she thinks about it. Writer listened as she shared her hurt, frustration, worrying and her still wanting him back if he changed his mind. She feels that the job she was hoping to get is now impossible because she was informed that she will still be here. Writer encouraged her to write down in her journal what she needs to work on or figure out once discharged. She attended group. Safety maintained on unit with 15 min checks.

## 2018-07-31 NOTE — Progress Notes (Signed)
Patient ID: Stacie Sanders, female   DOB: 1971-12-01, 47 y.o.   MRN: [id number]  Patient was tearful after her conversation with MD requesting to speak to writer. Emotional support and education about patient rights provided by Clinical research associatewriter and Psychologist, occupationalN Shalita. Writer and RN Shalita spent significant time with patient discussing her stressors. Patient was tearful and anxious but did improve throughout the conversation. At the end of conversation, patient was laughing and stated the NP on the hall was "more handsome than my husband". Patient encouraged to work on goals for treatment. Will continue to monitor.

## 2018-07-31 NOTE — [facility] Group Notes (Signed)
[facility] Group Notes:  (Nursing/MHT/Case Management/Adjunct)  Date:  07/31/2018  Time:  2:40 PM  Type of Therapy:  Psychoeducational Skills  Participation Level:  Active  Participation Quality:  Appropriate  Affect:  Appropriate  Cognitive:  Appropriate  Insight:  Appropriate  Engagement in Group:  Engaged  Modes of Intervention:  Problem-solving  Summary of Progress/Problems: Pt attended Psychoeducational group with top topic anger management.   Jacquelyne BalintForrest, Bobbi Yount Shanta 07/31/2018, 2:40 PM

## 2018-07-31 NOTE — BHH Counselor (Addendum)
Patient requested a 1:1 and stated that the craziness here is too much with the girls wanting to fight and the guys answering questions and talking about when he was 47 years old instead of the last time he was angry. Patient reports she has a job interview on Tuesday at 2pm and signed the 72 hour discharge. Patient reports frustration because the doctor stated she would not make it to that interview at the [employer] in walking distance by her home. Patient reports not feeling safe here and that she wishes she could go back to the hospital. Patient reports she has been put down for trying to avoid conflict with two patients and stated that she was pacing the hallways trying to escape. Patient reports she has been to all of her groups and tries to participate. Patient reports frustration that she came her voluntarily and is being put down.   CSW listened to patient and provided patient with a survey so she could feel her needs are being heard.

## 2018-07-31 NOTE — [facility] Group Notes (Signed)
LCSW Group Therapy Note  07/31/2018    10:00 - 11:00 AM               Type of Therapy and Topic:  Group Therapy: Anger Cues and Responses  Participation Level:  Active  In this group, patients learned how to recognize the physical, cognitive, emotional, and behavioral responses they have to anger-provoking situations.  They identified a recent time they became angry and how they reacted.  They analyzed how their reaction was possibly beneficial and how it was possibly unhelpful.  The group discussed anger warning signs and how to know when our anger can potentially become a problem. The group will discuss the cycle of anger and how our thoughts impact our feelings which in result affect our behaviors. Patients will explore alternative emotions in addition to feeling anger. Patients will share with the group and learn from CSW but also other patients coping skills that work for others.   Therapeutic Goals: 1. Patients will remember their last incident of anger and how they felt emotionally and physically, what their thoughts were at the time, and how they behaved. 2. Patients will identify how their behavior at that time worked for them, as well as how it worked against them. 3. Patients will explore how their body, mind and feelings play a role with anger. 4. Patients will learn that anger itself is normal and cannot be eliminated, and that healthier reactions can assist with resolving conflict rather than worsening situations. 5. Patients will learn the causes of anger and education on the negative implications anger can have on us overtime.  Summary of Patient Progress:  Patient was engaged and participated throughout the group session. The patient was in and out of group. However, leaving during patient's extensive response was understood. Patient reports their triggers include: people, women and quiet. Patient reports planning to add exercise, calling sister and hot bath as a coping skill upon  discharge from the hospital. Patient reports she plans to live her life to the best.   Therapeutic Modalities:   Cognitive Behavioral Therapy  Shellia CleverlyStephanie N Andrue Dini, LCSW  07/31/2018 5:19 PM

## 2018-07-31 NOTE — [facility] Suicide Risk Assessment (Signed)
[facility]   Nursing information obtained from:  Patient Demographic factors:  Unemployed, Caucasian, Divorced or widowed, Low socioeconomic status Current Mental Status:  Suicidal ideation indicated by others Loss Factors:  Loss of significant relationship, Financial problems / change in socioeconomic status Historical Factors:  Prior suicide attempts Risk Reduction Factors:  Sense of responsibility to family, Living with another person, especially a relative  Total Time spent with patient: 30 minutes Principal Problem: Bipolar disorder, unspecified (HCC) Diagnosis:   Patient Active Problem List   Diagnosis Date Noted  . Bipolar disorder, unspecified (HCC) [F31.9] 07/30/2018  . Respiratory failure (HCC) [J96.90] 07/28/2018  . Cocaine use disorder, mild, abuse (HCC) [F14.10]   . 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 Bipolar disorder (HCC) [F31.9] 06/18/2018  . Suicide attempt (HCC) [T14.91XA]   . Fatigue [R53.83] 03/01/2018  . Irritability [R45.4] 03/01/2018  . GAD (generalized anxiety disorder) [F41.1] 03/01/2018  . Gingivitis [K05.10] 03/01/2018  . Grinding of teeth [F45.8] 03/01/2018  . Healthcare maintenance [Z00.00] 02/15/2018  . Depression [F32.9] 02/15/2018  . Bipolar 1 disorder (HCC) [F31.9] 02/15/2018  . Cough in adult [R05] 02/15/2018  . Acute bacterial conjunctivitis of right eye [H10.31] 02/15/2018  . Acute respiratory failure (HCC) [J96.00] 01/03/2016  . Acute respiratory failure with hypoxemia (HCC) [J96.01]   . Acute encephalopathy [G93.40]   . Drug overdose [T50.901A] 12/29/2015  . Overdose [T50.901A] 12/29/2015   Subjective Data:.  Patient is a 47 year old female with a past psychiatric history significant for depression as well as alcohol use disorder/dependence who was admitted with a suspected drug overdose.  According to the chart her family reported that she had been increasingly depressed, and  sent a text around 8 PM that said she wanted to die on the date of admission.  She was found unresponsive at home by 1 of her sons.  EMS found alcohol and pill bottles at the residence.  She required intubation for airway protection.  She was extubated today.  Her blood alcohol on admission was 169, and her drug screen was negative.  The patient had been recently hospitalized at the behavioral health hospital in June of this year.  She had an intentional overdose at that time.  She was found at home unresponsive with empty pill bottles of BuSpar, gabapentin, Paxil, Seroquel and Atarax.  During the course of the hospitalization she denied that this was a suicide attempt, and reported confusing her pill packets.  She was discharged on Seroquel, Paxil, Wellbutrin, gabapentin and Atarax.  She had been scheduled to follow-up with [facility].  Unfortunately does not sound as though that took place.  She stated that her husband and she had decided to get a divorce.  She stated the lawyers were taking care of everything now.  She admitted that she had a court hearing for a DUI on 08/02/2018.  She also reported that she had a job interview on 08/02/2018.  She stated that she was not suicidal and wanted to be discharged.  She stated that her husband is now living with his girlfriend in [place].  She was in great denial about her circumstances.  She stated that there is a chance that she may be foreclosed, and that she is broken that her husband is not giving her any support at this time.  She was admitted to the hospital for evaluation and stabilization.  Continued Clinical Symptoms:  Alcohol Use Disorder Identification Test Final Score (AUDIT): 8 The "Alcohol  Use Disorders Identification Test", Guidelines for Use in Primary Care, Second Edition.  World Science writer [facility]). Score between 0-7:  no or low risk or alcohol related problems. Score between 8-15:  moderate risk of alcohol related problems. Score between  16-19:  high risk of alcohol related problems. Score 20 or above:  warrants further diagnostic evaluation for alcohol dependence and treatment.   CLINICAL FACTORS:   Depression:   Anhedonia Comorbid alcohol abuse/dependence Hopelessness Impulsivity Insomnia Alcohol/Substance Abuse/Dependencies   Musculoskeletal: Strength & Muscle Tone: within normal limits Gait & Station: normal Patient leans: N/A  Psychiatric Specialty Exam: Physical Exam  Nursing note and vitals reviewed. Constitutional: She is oriented to person, place, and time. She appears well-developed and well-nourished.  HENT:  Head: Normocephalic and atraumatic.  Respiratory: Effort normal.  Neurological: She is alert and oriented to person, place, and time.    ROS  Blood pressure 138/85, pulse 78, temperature 98.5 F (36.9 C), temperature source Oral, resp. rate 18, height 5\' 5"  (1.651 m), weight 94.3 kg (208 lb).Body mass index is 34.61 kg/m.  General Appearance: Disheveled  Eye Contact:  Fair  Speech:  Normal Rate  Volume:  Normal  Mood:  Anxious  Affect:  Congruent  Thought Process:  Coherent  Orientation:  Full (Time, Place, and Person)  Thought Content:  Logical  Suicidal Thoughts:  No  Homicidal Thoughts:  No  Memory:  Immediate;   Fair Recent;   Fair Remote;   Fair  Judgement:  Impaired  Insight:  Lacking  Psychomotor Activity:  Increased  Concentration:  Concentration: Fair and Attention Span: Fair  Recall:  Poor  Fund of Knowledge:  Fair  Language:  Good  Akathisia:  Negative  Handed:  Right  AIMS (if indicated):     Assets:  Desire for Improvement Physical Health Resilience  ADL's:  Intact  Cognition:  WNL  Sleep:  Number of Hours: 3.5      COGNITIVE FEATURES THAT CONTRIBUTE TO RISK:  Closed-mindedness and Thought constriction (tunnel vision)    SUICIDE RISK:   Moderate:  Frequent suicidal ideation with limited intensity, and duration, some specificity in terms of plans, no  associated intent, good self-control, limited dysphoria/symptomatology, some risk factors present, and identifiable protective factors, including available and accessible social support.  PLAN OF CARE: Patient is seen and examined.  Patient is a 47 year old female with the above-stated past psychiatric history who was admitted after another intentional overdose.  She will be admitted to the hospital.  She will be integrated into the milieu.  She will be placed on lorazepam 1 mg every 6 hours as needed a withdrawal scale greater than 10.  She will be restarted on her Paxil 10 mg p.o. daily.  This to be titrated during the course of hospitalization.  She will also have available trazodone 50 mg p.o. nightly as needed.  We will collect collateral information from her family.  I think she needs a form of substance abuse rehabilitation, and I think that may clarify some of her psychiatric issues and take care of her medications as well.  She will be on 15-minute checks for withdrawal symptoms as well as suicidality.  I certify that inpatient services furnished can reasonably be expected to improve the patient's condition.   Antonieta Pert, MD 07/31/2018, 5:57 PM

## 2018-07-31 NOTE — Progress Notes (Signed)
Patient ID: Stacie Kaufmanracy Sanders Stacie Sanders, female   DOB: 1971-08-27, 47 y.o.   MRN: [id number]  Nursing Progress Note [id number]  Data: Patient reports to writer she feels there is a miscommunication between her and the doctor. Patient reports she did not try to kill herself "this time" and states, "I haven't been pacing on the unit, one of the other patients talks too much and it gets on my nerves". Patient does appear anxious and tearful at times. Patient states she feels like, "my children hate me and love their father. I can't give them the things he can". Patient reports being motivated to get sober but is not agreeable to rehab today. Patient states she wants to discharge by Tuesday for "a job interview". Patient did request a room change this morning and it was provided. Patient complaint with scheduled medications. Patient completed self-inventory sheet and rates depression, hopelessness, and anxiety 0,0,0, respectively. Patient rates their sleep and appetite as fair/good respectively. Patient states goal for today is to "be myself". Patient is seen attending groups and visible in the milieu. Patient currently denies SI/HI/AVH.   Action: Patient educated about and provided medication per provider's orders. Patient safety maintained with q15 min safety checks and frequent rounding. Low fall risk precautions in place. Emotional support given. 1:1 interaction and active listening provided. Patient encouraged to attend meals and groups. Patient encouraged to work on treatment plan and goals. Labs, vital signs and patient behavior monitored throughout shift.   Response: Patient agrees to come to staff if any thoughts of SI/HI develop or if patient develops intention of acting on thoughts. Patient remains safe on the unit at this time. Patient is interacting with peers appropriately on the unit. Will continue to support and monitor.

## 2018-08-01 MED ORDER — LORATADINE 10 MG PO TABS
10.0000 mg | ORAL_TABLET | Freq: Every day | ORAL | Status: DC
  Administered 2018-08-01 – 2018-08-03 (×3): 10 mg via ORAL
  Filled 2018-08-01 (×5): qty 1

## 2018-08-01 MED ORDER — HYDROXYZINE HCL 50 MG PO TABS
50.0000 mg | ORAL_TABLET | Freq: Three times a day (TID) | ORAL | Status: DC | PRN
  Administered 2018-08-01 – 2018-08-02 (×2): 50 mg via ORAL
  Filled 2018-08-01: qty 10
  Filled 2018-08-01 (×2): qty 1

## 2018-08-01 MED ORDER — TRAZODONE HCL 100 MG PO TABS
100.0000 mg | ORAL_TABLET | Freq: Every evening | ORAL | Status: DC | PRN
  Administered 2018-08-01 – 2018-08-02 (×3): 100 mg via ORAL
  Filled 2018-08-01: qty 1
  Filled 2018-08-01: qty 14
  Filled 2018-08-01 (×2): qty 1

## 2018-08-01 NOTE — BHH Group Notes (Signed)
BHH LCSW Group Therapy Note  08/01/2018  10:00-11:00AM  Type of Therapy and Topic:  Group Therapy:  Building Supports  Participation Level: Active  Description of Group:  Patients in this group were introduced to the idea of adding a variety of healthy supports to address the various needs in their lives.  Different types of support were defined and described, and patients were asked to act out what each type could be.  Patients discussed what additional healthy supports could be helpful in their recovery and wellness after discharge in order to prevent future hospitalizations.   An emphasis was placed on following up with the discharge plan when they leave the hospital in order to continue becoming healthier and happier.  Therapeutic Goals: 1)  demonstrate the importance of adding supports  2)  discuss 4 definitions of support  3)  identify the patient's current level of healthy support and   4)  elicit commitments to add one healthy support   Summary of Patient Progress:  The patient expressed she has no desire to be anything other then sober. Patient reports her kids and sisters are her current supports but she would like to NA/AA as a support upon discharge from the hospital. Patient reports she plans to avoid anyone and outsiders that do not support the new her new journey.   Therapeutic Modalities:   Motivational Interviewing Brief Solution-Focused Therapy  Shellia CleverlyStephanie N Wendie Diskin

## 2018-08-01 NOTE — [facility] Group Notes (Signed)
[facility] Group Notes:  (Nursing/MHT/Case Management/Adjunct)  Date:  08/01/2018  Time:  2:07 PM  Type of Therapy:  Psychoeducational Skills  Participation Level:  Did Not Attend  Participation Quality:  DID NOT ATTEND  Affect:  DID NOT ATTEND  Cognitive:  DID NOT ATTEND  Insight:  None  Engagement in Group:  DID NOT ATTEND  Modes of Intervention:  DID NOT ATTEND  Summary of Progress/Problems: Pt did not attend patient self inventory group.    Bethann PunchesJane O Kreston Ahrendt 08/01/2018, 2:07 PM

## 2018-08-01 NOTE — Progress Notes (Signed)
DAR NOTE: Patient presents with anxious affect and depressed mood. Pt stated she did not sleep well last nigh, but as per self inventory she reports good sleep.t. Pt complained of anxiety, asking for medication she could not make out the name. " Do yuo have that medication for for jumpy, how do you call it?" Pt complained of sore throat, and aches in both ears, loratadine 10 mg PO given as ordered. Pt also observed in the day room restless and walking back and forth. Reports good appetite, normal energy and good concentration as per self inventory. Denies auditory and visual hallucinations.  Rates depression at 0, hopelessness at 0, and anxiety at 0.  Maintained on routine safety checks.  Medications given as prescribed.  Support and encouragement offered as needed.  Will continue to monitor.

## 2018-08-01 NOTE — Progress Notes (Signed)
[facility] MD Progress Note  08/01/2018 11:22 AM Stacie Sanders  MRN:  [id number]   Subjective: Patient reports that she does not feel very well today she states that she has kind of a scratchy throat and that her ears are hurting.  She is requesting it to be assessed.  She denies any SI/HI/AVH and contracts for safety.  She reports that she is wanting to be discharged and she does not need to be here as this was not a true suicide attempt.  She states that she has a court date at the end of the week and wants to know how long she is going to be here.  She also states that she does not think that he will here have her best interest at hand and that she regrets coming to this facility again.  Patient does state that she had some issues with sleep last night and feels that her medication for sleep needs to be increased.  Objective: Patient presents in the day room and has been attending groups today.  Patient has flat affect and seems to be irritated that she is not being discharged.  Suspect patient is making accusations about staff not having her best interest that him because she is still here when she wants to leave.  Patient is cooperative with me during assessment of her throat and her ears.  Patient seems to be having some type of sinus allergies and will be started on Claritin.  Patient has had significant overdose attempts in the past and her risk factors are extremely high as she is reported several times about her concerns for her divorce.  Patient is minimizing her symptoms.  Patient did complain about having some issues with sleep so we will increase her trazodone to 100 mg and will also increase her Vistaril to 50 mg 3 times daily to assist with her throughout the day.  Principal Problem: Bipolar disorder, unspecified (HCC) Diagnosis:   Patient Active Problem List   Diagnosis Date Noted  . Bipolar disorder, unspecified (HCC) [F31.9] 07/30/2018  . Respiratory failure (HCC) [J96.90] 07/28/2018   . Cocaine use disorder, mild, abuse (HCC) [F14.10]   . 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 Bipolar disorder (HCC) [F31.9] 06/18/2018  . Suicide attempt (HCC) [T14.91XA]   . Fatigue [R53.83] 03/01/2018  . Irritability [R45.4] 03/01/2018  . GAD (generalized anxiety disorder) [F41.1] 03/01/2018  . Gingivitis [K05.10] 03/01/2018  . Grinding of teeth [F45.8] 03/01/2018  . Healthcare maintenance [Z00.00] 02/15/2018  . Depression [F32.9] 02/15/2018  . Bipolar 1 disorder (HCC) [F31.9] 02/15/2018  . Cough in adult [R05] 02/15/2018  . Acute bacterial conjunctivitis of right eye [H10.31] 02/15/2018  . Acute respiratory failure (HCC) [J96.00] 01/03/2016  . Acute respiratory failure with hypoxemia (HCC) [J96.01]   . Acute encephalopathy [G93.40]   . Drug overdose [T50.901A] 12/29/2015  . Overdose [T50.901A] 12/29/2015   Total Time spent with patient: 30 minutes  Past Psychiatric History: See H&P  Past Medical History:  Past Medical History:  Diagnosis Date  . Allergic rhinitis   . Bipolar 1 disorder (HCC)   . Borderline diabetes mellitus   . Colon polyp   . Depression   . Dyspnea on exertion   . Esophageal stricture    scheduled for EGD and dilation 03-18-2016  . GAD (generalized anxiety disorder)   . History of acute respiratory failure    12-29-2015  due to polysubstance overdose--  pt intubated --  resolved   .  History of febrile seizure    x1  01/ 2017--  none since  . History of kidney stones    staghorn  . History of suicide attempt    12-29-2015  -- overdose klonopin, xanax, alcohol--  acute respirtory failure and acute encenphalopathy -- both resolved  . IBS (irritable bowel syndrome)   . Irregular menstrual cycle   . Renal calculi    bilateral    Past Surgical History:  Procedure Laterality Date  . COLONOSCOPY    . CYSTOSCOPY WITH STENT PLACEMENT Bilateral 03/10/2016   Procedure: CYSTOSCOPY WITH STENT PLACEMENT;  Surgeon: Malen GauzePatrick  L McKenzie, MD;  [facility];  Service: Urology;  Laterality: Bilateral;  . CYSTOSCOPY/RETROGRADE/URETEROSCOPY/STONE EXTRACTION WITH BASKET Bilateral 03/10/2016   Procedure: CYSTOSCOPY/RETROGRADE/URETEROSCOPY/STONE EXTRACTION WITH BASKET;  Surgeon: Malen GauzePatrick L McKenzie, MD;  [facility] [place];  Service: Urology;  Laterality: Bilateral;  . EXTRACORPOREAL SHOCK WAVE LITHOTRIPSY  x2    . PERCUTANEOUS NEPHROSTOLITHOTOMY Right 09-18-2005   staghorn  . TUBAL LIGATION  1999   Family History:  Family History  Problem Relation Age of Onset  . Cancer Father        pancreas  . Melanoma Father   . Cancer Maternal Grandmother        lung  . COPD Paternal Grandmother   . Melanoma Paternal Grandmother    Family Psychiatric  History: See H&P Social History:  Social History   Substance and Sexual Activity  Alcohol Use Yes   Comment: occ     Social History   Substance and Sexual Activity  Drug Use No    Social History   Socioeconomic History  . Marital status: Married    Spouse name: Not on file  . Number of children: Not on file  . Years of education: Not on file  . Highest education level: Not on file  Occupational History  . Not on file  Social Needs  . Financial resource strain: Not on file  . Food insecurity:    Worry: Not on file    Inability: Not on file  . Transportation needs:    Medical: Not on file    Non-medical: Not on file  Tobacco Use  . Smoking status: Current Every Day Smoker    Packs/day: 0.50    Years: 20.00    Pack years: 10.00    Types: Cigarettes  . Smokeless tobacco: Never Used  Substance and Sexual Activity  . Alcohol use: Yes    Comment: occ  . Drug use: No  . Sexual activity: Yes    Birth control/protection: Surgical  Lifestyle  . Physical activity:    Days per week: 0 days    Minutes per session: 0 min  . Stress: To some extent  Relationships  . Social connections:    Talks on phone: More than  three times a week    Gets together: Never    Attends religious service: Never    Active member of club or organization: No    Attends meetings of clubs or organizations: Never    Relationship status: Married  Other Topics Concern  . Not on file  Social History Narrative  . Not on file   Additional Social History:    Pain Medications: see mar Prescriptions: see mar Over the Counter: see mar History of alcohol / drug use?: Yes Longest period of sobriety (when/how long): unkwon Negative Consequences of Use: Legal  Sleep: Fair  Appetite:  Fair  Current Medications: Current Facility-Administered Medications  Medication Dose Route Frequency Provider Last Rate Last Dose  . alum & mag hydroxide-simeth (MAALOX/MYLANTA) 200-200-20 MG/5ML suspension 30 mL  30 mL Oral Q4H PRN Avishai Reihl, Gerlene Burdock, FNP      . dextromethorphan-guaiFENesin (MUCINEX DM) 30-600 MG per 12 hr tablet 1 tablet  1 tablet Oral BID Kerry Hough, PA-C   1 tablet at 08/01/18 0753  . hydrOXYzine (ATARAX/VISTARIL) tablet 50 mg  50 mg Oral TID PRN Meelah Tallo, Gerlene Burdock, FNP      . ibuprofen (ADVIL,MOTRIN) tablet 400 mg  400 mg Oral Q6H PRN Arav Bannister, Gerlene Burdock, FNP      . loratadine (CLARITIN) tablet 10 mg  10 mg Oral Daily Lawan Nanez, Gerlene Burdock, FNP      . LORazepam (ATIVAN) tablet 1 mg  1 mg Oral Q6H PRN Antonieta Pert, MD      . magnesium hydroxide (MILK OF MAGNESIA) suspension 30 mL  30 mL Oral Daily PRN Wilmoth Rasnic, Gerlene Burdock, FNP      . nicotine polacrilex (NICORETTE) gum 2 mg  2 mg Oral PRN Donell Sievert E, PA-C   2 mg at 08/01/18 0756  . PARoxetine (PAXIL) tablet 10 mg  10 mg Oral Daily Antonieta Pert, MD   10 mg at 08/01/18 0753  . traZODone (DESYREL) tablet 100 mg  100 mg Oral QHS PRN,MR X 1 Pamla Pangle, Gerlene Burdock, FNP        Lab Results:  Results for orders placed or performed during the hospital encounter of 07/30/18 (from the past 48 hour(s))  Lithium level     Status: Abnormal   Collection Time:  07/31/18  6:41 PM  Result Value Ref Range   Lithium Lvl <0.06 (L) 0.60 - 1.20 mmol/L    Comment: Performed at [facility], [address] [place], [place] [address]    Blood Alcohol level:  Lab Results  Component Value Date   ETH 169 (H) 07/27/2018   ETH 12 (H) 06/14/2018    Metabolic Disorder Labs: Lab Results  Component Value Date   HGBA1C 5.6 06/25/2018   MPG 114.02 06/25/2018   Lab Results  Component Value Date   PROLACTIN 10.1 06/25/2018   Lab Results  Component Value Date   CHOL 254 (H) 06/25/2018   TRIG 197 (H) 07/28/2018   HDL 61 06/25/2018   CHOLHDL 4.2 06/25/2018   VLDL 47 (H) 06/25/2018   LDLCALC 146 (H) 06/25/2018   LDLCALC 90 03/01/2018    Physical Findings: AIMS: Facial and Oral Movements Muscles of Facial Expression: None, normal Lips and Perioral Area: None, normal Jaw: None, normal Tongue: None, normal,Extremity Movements Upper (arms, wrists, hands, fingers): None, normal Lower (legs, knees, ankles, toes): None, normal, Trunk Movements Neck, shoulders, hips: None, normal, Overall Severity Severity of abnormal movements (highest score from questions above): None, normal Incapacitation due to abnormal movements: None, normal Patient's awareness of abnormal movements (rate only patient's report): No Awareness, Dental Status Current problems with teeth and/or dentures?: No Does patient usually wear dentures?: No  CIWA:    COWS:     Musculoskeletal: Strength & Muscle Tone: within normal limits Gait & Station: normal Patient leans: N/A  Psychiatric Specialty Exam: Physical Exam  Nursing note and vitals reviewed. Constitutional: She is oriented to person, place, and time. She appears well-developed and well-nourished.  HENT:  Right Ear: There is tenderness. Tympanic membrane is bulging.  Left Ear: There is tenderness. Tympanic  membrane is bulging.  Mouth/Throat: Posterior oropharyngeal erythema present.  Cardiovascular:  Normal rate.  Respiratory: Effort normal.  Musculoskeletal: Normal range of motion.  Neurological: She is alert and oriented to person, place, and time.  Skin: Skin is warm.    Review of Systems  Constitutional: Negative.   HENT: Positive for congestion, ear pain, sinus pain and sore throat.   Eyes: Negative.   Respiratory: Negative.   Cardiovascular: Negative.   Gastrointestinal: Negative.   Genitourinary: Negative.   Musculoskeletal: Negative.   Skin: Negative.   Neurological: Negative.   Endo/Heme/Allergies: Negative.     Blood pressure 117/82, pulse 87, temperature 98.3 F (36.8 C), temperature source Oral, resp. rate 18, height 5\' 5"  (1.651 m), weight 94.3 kg (208 lb).Body mass index is 34.61 kg/m.  General Appearance: Disheveled  Eye Contact:  Good  Speech:  Clear and Coherent and Normal Rate  Volume:  Normal  Mood:  Depressed  Affect:  Flat  Thought Process:  Goal Directed and Descriptions of Associations: Intact  Orientation:  Full (Time, Place, and Person)  Thought Content:  WDL  Suicidal Thoughts:  No  Homicidal Thoughts:  No  Memory:  Immediate;   Good Recent;   Good Remote;   Good  Judgement:  Fair  Insight:  Fair  Psychomotor Activity:  Normal  Concentration:  Concentration: Good and Attention Span: Good  Recall:  Good  Fund of Knowledge:  Good  Language:  Good  Akathisia:  No  Handed:  Right  AIMS (if indicated):     Assets:  Architect Housing Physical Health Social Support Transportation  ADL's:  Intact  Cognition:  WNL  Sleep:  Number of Hours: 6.25   Problems addressed Bipolar disorder unspecified Seasonal allergies  Treatment Plan Summary: Daily contact with patient to assess and evaluate symptoms and progress in treatment, Medication management and Plan is to: Start Claritin 10 mg p.o. daily for allergies Increase Vistaril 50 mg p.o. 3 times daily as needed for anxiety Continue Ativan 1 mg  every 6 hours as needed for C1 greater than 10 Continue Paxil 10 mg p.o. daily for mood stability Increase trazodone 100 mg p.o. nightly as needed for insomnia Continue Mucinex DM 600 mg twice daily for cough and congestion Encourage group therapy participation  Maryfrances Bunnell, FNP 08/01/2018, 11:22 AM

## 2018-08-02 ENCOUNTER — Encounter (HOSPITAL_COMMUNITY): Payer: Self-pay | Admitting: Behavioral Health

## 2018-08-02 MED ORDER — PAROXETINE HCL 10 MG PO TABS: 10.0000 mg | ORAL_TABLET | Freq: Every day | ORAL | 0 refills | Status: DC

## 2018-08-02 MED ORDER — HYDROXYZINE HCL 50 MG PO TABS: 50.0000 mg | ORAL_TABLET | Freq: Three times a day (TID) | ORAL | 0 refills | Status: DC | PRN

## 2018-08-02 MED ORDER — TRAZODONE HCL 100 MG PO TABS: 100.0000 mg | ORAL_TABLET | Freq: Every evening | ORAL | 0 refills | Status: DC | PRN

## 2018-08-02 NOTE — Progress Notes (Addendum)
[facility] MD Progress Note  08/02/2018 11:09 AM Stacie Sanders  MRN:  [id number]   Subjective: " I am fine. I was hoping I could get out of here. I have an interview tomorrow and it doesn't seem like I am going to make it.".  Objective: Face to face evaluation completed, case discussed with treatment team and chart reviewed. Per chart review, patient was admitted with suspected drug overdose.  During this assessment patient continues to minimize her reason for admission and seems to be focused on discharge. Her mood appears depressed and affect congruent although she denies any feeling of depression at this time and denies any anxiety. She endorses that her sleeping pattern did slightly improve with Trazodone although reports she was up on and off throughout the night due to her roommate snoring. She endorse no concerns with appetite. She reports she had some chest pain yesterday (denies at this time) and reports staff seemed to, " brush it sunder the table." Her vitals are stable and EKG is normal. She reports that she filled out a 72 hour request for discharge 07/31/2018 however, no form was found completed. She endorses no concerns with current medications as noted below.She minimizes her substance abuse and denies withdrawal symptoms at this time. Endorses generalized body aches yet has declined Ibuprofen which was suggested.  Denies SI, HI or AVH and does not appear internally preoccupied. At this time, she is contracting for safety on the unit   .  Principal Problem: Bipolar disorder, unspecified (HCC) Diagnosis:   Patient Active Problem List   Diagnosis Date Noted  . Bipolar disorder, unspecified (HCC) [F31.9] 07/30/2018  . Respiratory failure (HCC) [J96.90] 07/28/2018  . Cocaine use disorder, mild, abuse (HCC) [F14.10]   . 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 Bipolar disorder (HCC) [F31.9] 06/18/2018  . Suicide attempt (HCC) [T14.91XA]   . Fatigue [R53.83]  03/01/2018  . Irritability [R45.4] 03/01/2018  . GAD (generalized anxiety disorder) [F41.1] 03/01/2018  . Gingivitis [K05.10] 03/01/2018  . Grinding of teeth [F45.8] 03/01/2018  . Healthcare maintenance [Z00.00] 02/15/2018  . Depression [F32.9] 02/15/2018  . Bipolar 1 disorder (HCC) [F31.9] 02/15/2018  . Cough in adult [R05] 02/15/2018  . Acute bacterial conjunctivitis of right eye [H10.31] 02/15/2018  . Acute respiratory failure (HCC) [J96.00] 01/03/2016  . Acute respiratory failure with hypoxemia (HCC) [J96.01]   . Acute encephalopathy [G93.40]   . Drug overdose [T50.901A] 12/29/2015  . Overdose [T50.901A] 12/29/2015   Total Time spent with patient: 20 minutes  Past Psychiatric History: See H&P  Past Medical History:  Past Medical History:  Diagnosis Date  . Allergic rhinitis   . Bipolar 1 disorder (HCC)   . Borderline diabetes mellitus   . Colon polyp   . Depression   . Dyspnea on exertion   . Esophageal stricture    scheduled for EGD and dilation 03-18-2016  . GAD (generalized anxiety disorder)   . History of acute respiratory failure    12-29-2015  due to polysubstance overdose--  pt intubated --  resolved   . History of febrile seizure    x1  01/ 2017--  none since  . History of kidney stones    staghorn  . History of suicide attempt    12-29-2015  -- overdose klonopin, xanax, alcohol--  acute respirtory failure and acute encenphalopathy -- both resolved  . IBS (irritable bowel syndrome)   . Irregular menstrual cycle   . Renal calculi    bilateral  Past Surgical History:  Procedure Laterality Date  . COLONOSCOPY    . CYSTOSCOPY WITH STENT PLACEMENT Bilateral 03/10/2016   Procedure: CYSTOSCOPY WITH STENT PLACEMENT;  Surgeon: Malen Gauze, MD;  Location[facility];  Service: Urology;  Laterality: Bilateral;  . CYSTOSCOPY/RETROGRADE/URETEROSCOPY/STONE EXTRACTION WITH BASKET Bilateral 03/10/2016   Procedure:  CYSTOSCOPY/RETROGRADE/URETEROSCOPY/STONE EXTRACTION WITH BASKET;  Surgeon: Malen Gauze, MD;  Location: [facility];  Service: Urology;  Laterality: Bilateral;  . EXTRACORPOREAL SHOCK WAVE LITHOTRIPSY  x2    . PERCUTANEOUS NEPHROSTOLITHOTOMY Right 09-18-2005   staghorn  . TUBAL LIGATION  1999   Family History:  Family History  Problem Relation Age of Onset  . Cancer Father        pancreas  . Melanoma Father   . Cancer Maternal Grandmother        lung  . COPD Paternal Grandmother   . Melanoma Paternal Grandmother    Family Psychiatric  History: See H&P Social History:  Social History   Substance and Sexual Activity  Alcohol Use Yes   Comment: occ     Social History   Substance and Sexual Activity  Drug Use No    Social History   Socioeconomic History  . Marital status: Married    Spouse name: Not on file  . Number of children: Not on file  . Years of education: Not on file  . Highest education level: Not on file  Occupational History  . Not on file  Social Needs  . Financial resource strain: Not on file  . Food insecurity:    Worry: Not on file    Inability: Not on file  . Transportation needs:    Medical: Not on file    Non-medical: Not on file  Tobacco Use  . Smoking status: Current Every Day Smoker    Packs/day: 0.50    Years: 20.00    Pack years: 10.00    Types: Cigarettes  . Smokeless tobacco: Never Used  Substance and Sexual Activity  . Alcohol use: Yes    Comment: occ  . Drug use: No  . Sexual activity: Yes    Birth control/protection: Surgical  Lifestyle  . Physical activity:    Days per week: 0 days    Minutes per session: 0 min  . Stress: To some extent  Relationships  . Social connections:    Talks on phone: More than three times a week    Gets together: Never    Attends religious service: Never    Active member of club or organization: No    Attends meetings of clubs or organizations: Never    Relationship  status: Married  Other Topics Concern  . Not on file  Social History Narrative  . Not on file   Additional Social History:    Pain Medications: see mar Prescriptions: see mar Over the Counter: see mar History of alcohol / drug use?: Yes Longest period of sobriety (when/how long): unkwon Negative Consequences of Use: Legal                    Sleep: Fair  Appetite:  Fair  Current Medications: Current Facility-Administered Medications  Medication Dose Route Frequency Provider Last Rate Last Dose  . alum & mag hydroxide-simeth (MAALOX/MYLANTA) 200-200-20 MG/5ML suspension 30 mL  30 mL Oral Q4H PRN Money, Gerlene Burdock, FNP   30 mL at 08/01/18 1922  . dextromethorphan-guaiFENesin (MUCINEX DM) 30-600 MG per 12 hr tablet 1 tablet  1  tablet Oral BID Kerry Hough, PA-C   1 tablet at 08/02/18 1048  . hydrOXYzine (ATARAX/VISTARIL) tablet 50 mg  50 mg Oral TID PRN Money, Gerlene Burdock, FNP   50 mg at 08/01/18 2158  . ibuprofen (ADVIL,MOTRIN) tablet 400 mg  400 mg Oral Q6H PRN Money, Gerlene Burdock, FNP      . loratadine (CLARITIN) tablet 10 mg  10 mg Oral Daily Money, Gerlene Burdock, FNP   10 mg at 08/02/18 1048  . LORazepam (ATIVAN) tablet 1 mg  1 mg Oral Q6H PRN Antonieta Pert, MD   1 mg at 08/01/18 1922  . magnesium hydroxide (MILK OF MAGNESIA) suspension 30 mL  30 mL Oral Daily PRN Money, Feliz Beam B, FNP      . nicotine polacrilex (NICORETTE) gum 2 mg  2 mg Oral PRN Donell Sievert E, PA-C   2 mg at 08/02/18 1048  . PARoxetine (PAXIL) tablet 10 mg  10 mg Oral Daily Antonieta Pert, MD   10 mg at 08/02/18 1048  . traZODone (DESYREL) tablet 100 mg  100 mg Oral QHS PRN,MR X 1 Money, Gerlene Burdock, FNP   100 mg at 08/01/18 2158    Lab Results:  Results for orders placed or performed during the hospital encounter of 07/30/18 (from the past 48 hour(s))  Lithium level     Status: Abnormal   Collection Time: 07/31/18  6:41 PM  Result Value Ref Range   Lithium Lvl <0.06 (L) 0.60 - 1.20 mmol/L     Comment: Performed at [facility], [address] [place], [place] [address]    Blood Alcohol level:  Lab Results  Component Value Date   ETH 169 (H) 07/27/2018   ETH 12 (H) 06/14/2018    Metabolic Disorder Labs: Lab Results  Component Value Date   HGBA1C 5.6 06/25/2018   MPG 114.02 06/25/2018   Lab Results  Component Value Date   PROLACTIN 10.1 06/25/2018   Lab Results  Component Value Date   CHOL 254 (H) 06/25/2018   TRIG 197 (H) 07/28/2018   HDL 61 06/25/2018   CHOLHDL 4.2 06/25/2018   VLDL 47 (H) 06/25/2018   LDLCALC 146 (H) 06/25/2018   LDLCALC 90 03/01/2018    Physical Findings: AIMS: Facial and Oral Movements Muscles of Facial Expression: None, normal Lips and Perioral Area: None, normal Jaw: None, normal Tongue: None, normal,Extremity Movements Upper (arms, wrists, hands, fingers): None, normal Lower (legs, knees, ankles, toes): None, normal, Trunk Movements Neck, shoulders, hips: None, normal, Overall Severity Severity of abnormal movements (highest score from questions above): None, normal Incapacitation due to abnormal movements: None, normal Patient's awareness of abnormal movements (rate only patient's report): No Awareness, Dental Status Current problems with teeth and/or dentures?: No Does patient usually wear dentures?: No  CIWA:    COWS:     Musculoskeletal: Strength & Muscle Tone: within normal limits Gait & Station: normal Patient leans: N/A  Psychiatric Specialty Exam: Physical Exam  Nursing note and vitals reviewed. Constitutional: She is oriented to person, place, and time. She appears well-developed and well-nourished.  HENT:  Right Ear: There is tenderness. Tympanic membrane is bulging.  Left Ear: There is tenderness. Tympanic membrane is bulging.  Mouth/Throat: Posterior oropharyngeal erythema present.  Cardiovascular: Normal rate.  Respiratory: Effort normal.  Musculoskeletal: Normal range of motion.   Neurological: She is alert and oriented to person, place, and time.  Skin: Skin is warm.    Review of Systems  Constitutional: Negative.  Eyes: Negative.   Respiratory: Negative.   Cardiovascular: Negative.   Gastrointestinal: Negative.   Genitourinary: Negative.   Musculoskeletal:       Bodyaches   Skin: Negative.   Neurological: Negative.   Endo/Heme/Allergies: Negative.   Psychiatric/Behavioral: Positive for substance abuse. Negative for depression, hallucinations, memory loss and suicidal ideas. The patient is not nervous/anxious and does not have insomnia.   All other systems reviewed and are negative.   Blood pressure 116/76, pulse 87, temperature 98.7 F (37.1 C), temperature source Oral, resp. rate 20, height 5\' 5"  (1.651 m), weight 94.3 kg (208 lb).Body mass index is 34.61 kg/m.  General Appearance: Disheveled  Eye Contact:  Good  Speech:  Clear and Coherent and Normal Rate  Volume:  Normal  Mood:  Depressed although minimizes.   Affect:  Flat  Thought Process:  Goal Directed and Descriptions of Associations: Intact  Orientation:  Full (Time, Place, and Person)  Thought Content:  WDL  Suicidal Thoughts:  No  Homicidal Thoughts:  No  Memory:  Immediate;   Good Recent;   Good Remote;   Good  Judgement:  Fair  Insight:  Fair  Psychomotor Activity:  Normal  Concentration:  Concentration: Good and Attention Span: Good  Recall:  Good  Fund of Knowledge:  Good  Language:  Good  Akathisia:  No  Handed:  Right  AIMS (if indicated):     Assets:  ArchitectCommunication Skills Financial Resources/Insurance Housing Physical Health Social Support Transportation  ADL's:  Intact  Cognition:  WNL  Sleep:  Number of Hours: 5.25   Problems addressed Bipolar disorder unspecified Seasonal allergies  Treatment Plan Summary: Reviewed current treatment plan. Will continue the following plan without adjustments at this time;  Daily contact with patient to assess and evaluate  symptoms and progress in treatment, Medication management and Plan is to: Start Claritin 10 mg p.o. daily for allergies Continue Vistaril 50 mg p.o. 3 times daily as needed for anxiety Continue Ativan 1 mg every 6 hours as needed for C1 greater than 10 Continue Paxil 10 mg p.o. daily for mood stability Continue trazodone 100 mg p.o. nightly as needed for insomnia Continue Mucinex DM 600 mg twice daily for cough and congestion Encourage group therapy participation  Denzil MagnusonLaShunda Nuchem Grattan, NP 08/02/2018, 11:09 AM   Patient ID: Stacie Racerracy Leigh Kyser, female   DOB: 07-03-1971, 47 y.o.   MRN: [id number]

## 2018-08-02 NOTE — [facility] Suicide Risk Assessment (Signed)
[facility] INPATIENT:  Family/Significant Other Suicide Prevention Education  Suicide Prevention Education:  Patient Refusal for Family/Significant Other Suicide Prevention Education: The patient Stacie Sanders has refused to provide written consent for family/significant other to be provided Family/Significant Other Suicide Prevention Education during admission and/or prior to discharge.  Physician notified.  SPE completed with pt, as pt refused to consent to family contact. SPI pamphlet provided to pt and pt was encouraged to share information with support network, ask questions, and talk about any concerns relating to SPE. Pt denies access to guns/firearms and verbalized understanding of information provided. Mobile Crisis information also provided to pt.   Rona RavensHeather S Denise Bramblett LCSW 08/02/2018, 2:58 PM

## 2018-08-02 NOTE — [facility] Group Notes (Signed)
LCSW Group Therapy Note   08/02/2018 1:15pm   Type of Therapy and Topic:  Group Therapy:  Overcoming Obstacles   Participation Level:  Active   Description of Group:    In this group patients will be encouraged to explore what they see as obstacles to their own wellness and recovery. They will be guided to discuss their thoughts, feelings, and behaviors related to these obstacles. The group will process together ways to cope with barriers, with attention given to specific choices patients can make. Each patient will be challenged to identify changes they are motivated to make in order to overcome their obstacles. This group will be process-oriented, with patients participating in exploration of their own experiences as well as giving and receiving support and challenge from other group members.   Therapeutic Goals: 1. Patient will identify personal and current obstacles as they relate to admission. 2. Patient will identify barriers that currently interfere with their wellness or overcoming obstacles.  3. Patient will identify feelings, thought process and behaviors related to these barriers. 4. Patient will identify two changes they are willing to make to overcome these obstacles:      Summary of Patient Progress Stacie Sanders was attentive and engaged. She shared that her biggest obstacle is learning how to be alone in wake of recent separation from husband of 22 years. Stacie Sanders shared that she continues to struggle with being alone and learning how to "stay busy and avoid drinking." She is also struggling with the death of her grandson by drowning while in her care. Stacie Sanders continues to show progress in the group setting with improving insight.     Therapeutic Modalities:   Cognitive Behavioral Therapy Solution Focused Therapy Motivational Interviewing Relapse Prevention Therapy  Stacie RavensHeather S Damauri Minion, LCSW 08/02/2018 2:03 PM

## 2018-08-02 NOTE — Plan of Care (Signed)
Pt progressing in the following metrics  D: pt found in bed this morning. Pt requested to sleep longer. Pt presented to the medication window afterward; compliant with medications. Pt states she slept well last night. Pt denies any depression/hopelessness/anxiety, rating these all 0/10. Pt states she is having body aches but failed to rate this pain. Pt denies any pain to this Clinical research associatewriter during assessment. Pt states her goal for today is to continue to keep looking forward. Pt will achieve this by putting most past issues in the past. Pt denies any si/hi/ah/vh and verbally agrees to approach staff if these become apparent.  A: pt provided support and encouragement. Pt provided medications per protocol and standing orders. Q451m safety checks implemented and continued. R: pt safe on the unit. Will continue to monitor.   Problem: Education: Goal: Mental status will improve Outcome: Progressing   Problem: Activity: Goal: Interest or engagement in activities will improve Outcome: Progressing Goal: Sleeping patterns will improve Outcome: Progressing   Problem: Coping: Goal: Ability to demonstrate self-control will improve Outcome: Progressing   Problem: Health Behavior/Discharge Planning: Goal: Compliance with treatment plan for underlying cause of condition will improve Outcome: Progressing   Problem: Safety: Goal: Periods of time without injury will increase Outcome: Progressing   Problem: Education: Goal: Knowledge of the prescribed therapeutic regimen will improve Outcome: Progressing   Problem: Activity: Goal: Interest or engagement in leisure activities will improve Outcome: Progressing Goal: Imbalance in normal sleep/wake cycle will improve Outcome: Progressing   Problem: Safety: Goal: Ability to disclose and discuss suicidal ideas will improve Outcome: Progressing

## 2018-08-02 NOTE — Discharge Summary (Addendum)
Physician Discharge Summary Note  Patient:  Stacie Sanders is an 47 y.o., female MRN:  [id number] DOB:  [birth year] Patient phone:  [phone] (home)  Patient address:   [address] [place] [id number],  Total Time spent with patient: 30 minutes  Date of Admission:  07/30/2018 Date of Discharge: 08/03/2018  Reason for Admission:  Suspected overdose   Principal Problem: Bipolar disorder, unspecified [facility](HCC) Discharge Diagnoses: Patient Active Problem List   Diagnosis Date Noted  . Bipolar disorder, unspecified (HCC) [F31.9] 07/30/2018  . Respiratory failure (HCC) [J96.90] 07/28/2018  . Cocaine use disorder, mild, abuse (HCC) [F14.10]   . 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 Bipolar disorder (HCC) [F31.9] 06/18/2018  . Suicide attempt (HCC) [T14.91XA]   . Fatigue [R53.83] 03/01/2018  . Irritability [R45.4] 03/01/2018  . GAD (generalized anxiety disorder) [F41.1] 03/01/2018  . Gingivitis [K05.10] 03/01/2018  . Grinding of teeth [F45.8] 03/01/2018  . Healthcare maintenance [Z00.00] 02/15/2018  . Depression [F32.9] 02/15/2018  . Bipolar 1 disorder (HCC) [F31.9] 02/15/2018  . Cough in adult [R05] 02/15/2018  . Acute bacterial conjunctivitis of right eye [H10.31] 02/15/2018  . Acute respiratory failure (HCC) [J96.00] 01/03/2016  . Acute respiratory failure with hypoxemia (HCC) [J96.01]   . Acute encephalopathy [G93.40]   . Drug overdose [T50.901A] 12/29/2015  . Overdose [T50.901A] 12/29/2015    Past Psychiatric History: Bipolar I, [facility], Hospitalized in 2016 for overdose as a suicide attempt after grandson died, intentional overdose in June 2019    Past Medical History:  Past Medical History:  Diagnosis Date  . Allergic rhinitis   . Bipolar 1 disorder (HCC)   . Borderline diabetes mellitus   . Colon polyp   . Depression   . Dyspnea on exertion   . Esophageal stricture    scheduled for EGD and dilation  03-18-2016  . GAD (generalized anxiety disorder)   . History of acute respiratory failure    12-29-2015  due to polysubstance overdose--  pt intubated --  resolved   . History of febrile seizure    x1  01/ 2017--  none since  . History of kidney stones    staghorn  . History of suicide attempt    12-29-2015  -- overdose klonopin, xanax, alcohol--  acute respirtory failure and acute encenphalopathy -- both resolved  . IBS (irritable bowel syndrome)   . Irregular menstrual cycle   . Renal calculi    bilateral    Past Surgical History:  Procedure Laterality Date  . COLONOSCOPY    . CYSTOSCOPY WITH STENT PLACEMENT Bilateral 03/10/2016   Procedure: CYSTOSCOPY WITH STENT PLACEMENT;  Surgeon: Malen GauzePatrick L McKenzie, MD;  [facility] [place];  Service: Urology;  Laterality: Bilateral;  . CYSTOSCOPY/RETROGRADE/URETEROSCOPY/STONE EXTRACTION WITH BASKET Bilateral 03/10/2016   Procedure: CYSTOSCOPY/RETROGRADE/URETEROSCOPY/STONE EXTRACTION WITH BASKET;  Surgeon: Malen GauzePatrick L McKenzie, MD;  [facility] [place];  Service: Urology;  Laterality: Bilateral;  . EXTRACORPOREAL SHOCK WAVE LITHOTRIPSY  x2    . PERCUTANEOUS NEPHROSTOLITHOTOMY Right 09-18-2005   staghorn  . TUBAL LIGATION  1999   Family History:  Family History  Problem Relation Age of Onset  . Cancer Father        pancreas  . Melanoma Father   . Cancer Maternal Grandmother        lung  . COPD Paternal Grandmother   . Melanoma Paternal Grandmother    Family Psychiatric  History: Dad- schizophrenia and PTSD   Social History:  Social History   Substance and Sexual Activity  Alcohol Use Yes   Comment: occ     Social History   Substance and Sexual Activity  Drug Use No    Social History   Socioeconomic History  . Marital status: Married    Spouse name: Not on file  . Number of children: Not on file  . Years of education: Not on file  . Highest education level: Not on file  Occupational  History  . Not on file  Social Needs  . Financial resource strain: Not on file  . Food insecurity:    Worry: Not on file    Inability: Not on file  . Transportation needs:    Medical: Not on file    Non-medical: Not on file  Tobacco Use  . Smoking status: Current Every Day Smoker    Packs/day: 0.50    Years: 20.00    Pack years: 10.00    Types: Cigarettes  . Smokeless tobacco: Never Used  Substance and Sexual Activity  . Alcohol use: Yes    Comment: occ  . Drug use: No  . Sexual activity: Yes    Birth control/protection: Surgical  Lifestyle  . Physical activity:    Days per week: 0 days    Minutes per session: 0 min  . Stress: To some extent  Relationships  . Social connections:    Talks on phone: More than three times a week    Gets together: Never    Attends religious service: Never    Active member of club or organization: No    Attends meetings of clubs or organizations: Never    Relationship status: Married  Other Topics Concern  . Not on file  Social History Narrative  . Not on file    Hospital Course: Per chart review, patient was admitted with suspected drug overdose. Her family reports that she has been increasingly depressed and sent them a text around 8 pm that stated she wanted to die the day of admission. She was found unresponsive at home by her sons. Her family presumes that she overdosed. EMS found alcohol and pill bottles at her residence. She required intubation for airway protection. She was extubated today. BAL was 169 and UDS was negative on admission.  Of note, patient was discharged from [facility] on 6/28. She was treated for suicide attempt by drug overdose in the setting of her husband being unfaithful. She was found at home unresponsive with empty bill bottles of Buspar, Gabapentin, Paxil, Seroquel and Atarax. There were also beer bottles present. She continued to deny suicide attempt throughout hospitalization and reported confusing her pill packets  since her doctor recently changed the package. Discharge medications included Seroquel 500 mg qhs, Paxil 20 mg qhs, Wellbutrin 100 mg daily, Gabapentin 400 mg TID and Atarax 25 mg q 6 hours PRN. She was scheduled followed with [facility].  Jaleah was started on medication regimen for presenting symptoms. She was medicated & discharged on; Vistaril 50 mg p.o. 3 times daily as needed for anxiety, Paxil 10 mg p.o. daily for mood stability, trazodone 100 mg p.o. nightly as needed for insomnia. Patient has been adherent with treatment recommendations. Patient tolerated the medications without any reported side effects are adverse reactions.  Patient was enrolled & participated in the group counseling sessions being offerred & held on this unit. Patient learned coping skills.  During this hospital course, patient minimized her reason for admission as well as her substance abuse. She was  very vague when discussing the need for follow-up substance abuse treatment and her insight remained very poor.    She was seen  by the attending psychiatrist for discharge and denied any delusions, no hallucinations or other psychotic process. She denied active or passive suicidal thoughts. No thoughts of violence. No craving for drugs. Endorsed overall improvement in mood emotional state although again, seemed to be minimizing. Nursing staff reported that patient had been appropriate on the unit.   On discharge, patient was provided with all follow-up information to resume mental health treatment following discharge as noted below. Kiana was provided with a prescription for her [facility] discharge medications.  Patient left [facility] with all personal belongings in no apparent distress. Transportation per patient/ family arrangement.    Labs: Reviewed and noted as below  Physical Findings: AIMS: Facial and Oral Movements Muscles of Facial Expression: None, normal Lips and Perioral Area: None, normal Jaw: None, normal Tongue: None,  normal,Extremity Movements Upper (arms, wrists, hands, fingers): None, normal Lower (legs, knees, ankles, toes): None, normal, Trunk Movements Neck, shoulders, hips: None, normal, Overall Severity Severity of abnormal movements (highest score from questions above): None, normal Incapacitation due to abnormal movements: None, normal Patient's awareness of abnormal movements (rate only patient's report): No Awareness, Dental Status Current problems with teeth and/or dentures?: No Does patient usually wear dentures?: No  CIWA:    COWS:     Musculoskeletal: Strength & Muscle Tone: within normal limits Gait & Station: normal Patient leans: N/A  Psychiatric Specialty Exam: SEE SRA BY MD  Physical Exam  Nursing note and vitals reviewed. Constitutional: She is oriented to person, place, and time.  Neurological: She is alert and oriented to person, place, and time.    Review of Systems  Psychiatric/Behavioral: Positive for substance abuse. Negative for hallucinations, memory loss and suicidal ideas. Depression: stable. The patient does not have insomnia (stable). Nervous/anxious: stable    All other systems reviewed and are negative.   Blood pressure 116/76, pulse 87, temperature 98.7 F (37.1 C), temperature source Oral, resp. rate 20, height 5\' 5"  (1.651 m), weight 94.3 kg (208 lb).Body mass index is 34.61 kg/m.   Have you used any form of tobacco in the last 30 days? (Cigarettes, Smokeless Tobacco, Cigars, and/or Pipes): Yes  Has this patient used any form of tobacco in the last 30 days? (Cigarettes, Smokeless Tobacco, Cigars, and/or Pipes)  Yes, A prescription for an FDA-approved tobacco cessation medication was offered at discharge and the patient refused  .labs  Blood Alcohol level:  Lab Results  Component Value Date   ETH 169 (H) 07/27/2018   ETH 12 (H) 06/14/2018    Metabolic Disorder Labs:  Lab Results  Component Value Date   HGBA1C 5.6 06/25/2018   MPG 114.02  06/25/2018   Lab Results  Component Value Date   PROLACTIN 10.1 06/25/2018   Lab Results  Component Value Date   CHOL 254 (H) 06/25/2018   TRIG 197 (H) 07/28/2018   HDL 61 06/25/2018   CHOLHDL 4.2 06/25/2018   VLDL 47 (H) 06/25/2018   LDLCALC 146 (H) 06/25/2018   LDLCALC 90 03/01/2018    See Psychiatric Specialty Exam and Suicide Risk Assessment completed by Attending Physician prior to discharge.  Discharge destination:  Home  Is patient on multiple antipsychotic therapies at discharge:  No   Has Patient had three or more failed trials of antipsychotic monotherapy by history:  No  Recommended Plan for Multiple Antipsychotic Therapies: NA   Allergies as  of 08/03/2018      Reactions   Fentanyl Hives, Itching   Percocet [oxycodone-acetaminophen] Hives, Itching   Robaxin [methocarbamol] Rash      Medication List    TAKE these medications     Indication  hydrOXYzine 50 MG tablet Commonly known as:  ATARAX/VISTARIL Take 1 tablet (50 mg total) by mouth 3 (three) times daily as needed for anxiety.  Indication:  Feeling Anxious   PARoxetine 10 MG tablet Commonly known as:  PAXIL Take 1 tablet (10 mg total) by mouth daily.  Indication:  Major Depressive Disorder   traZODone 100 MG tablet Commonly known as:  DESYREL Take 1 tablet (100 mg total) by mouth at bedtime as needed and may repeat dose one time if needed for sleep.  Indication:  Trouble Sleeping      Follow-up Information    [facility] Follow up on 08/09/2018.   Specialty:  Behavioral Health Why:  Assessment for therapy on Monday 8/12 at 1:30PM with Andrena Mews. Please bring: photo ID. Please call within 48 hours of scheduled appt to cancel or reschedule if necessary. Thank you! Contact information: [address] [place] [address] [phone]        [facility] Follow up on 08/04/2018.   Why:  Hospital follow-up/medication  management at 10:15AM on Wednesday, 8/7 with Ayesha Mohair NP. Thank you.  Contact information: [address] [place] [address] [phone]          Follow-up recommendations: Follow up with your outpatient provided for any medical issues. Activity & diet as recommended by your primary care provider.  Comments:  Patient is instructed prior to discharge to: Take all medications as prescribed by his/her mental healthcare provider. Report any adverse effects and or reactions from the medicines to his/her outpatient provider promptly. Patient has been instructed & cautioned: To not engage in alcohol and or illegal drug use while on prescription medicines. In the event of worsening symptoms, patient is instructed to call the crisis hotline, 911 and or go to the nearest ED for appropriate evaluation and treatment of symptoms. To follow-up with his/her primary care provider for your other medical issues, concerns and or health care needs.  Signed: Denzil Magnuson, NP 08/03/2018, 8:29 AM

## 2018-08-02 NOTE — Discharge Summary (Signed)
Stacie Sanders, is a 47 y.o. female  DOB May 13, 1971  MRN [id number].  Admission date:  07/27/2018  Admitting Physician  Erin Fulling, MD  Discharge Date:  07/30/18   Primary MD  Julaine Fusi, NP  Recommendations for primary care physician for things to follow:   Patient has been cleared for transfer to inpatient psychiatric unit  Late entry note, patient was discharged 07/30/2018,  Admission Diagnosis  Obtundation [R40.1] Intentional drug overdose, initial encounter [facility]) [T50.902A]   Discharge Diagnosis  Obtundation [R40.1] Intentional drug overdose, initial encounter [facility] [T50.902A]    Principal Problem:   Suicide attempt [facility]) Active Problems:   Respiratory failure (HCC)      Past Medical History:  Diagnosis Date  . Allergic rhinitis   . Bipolar 1 disorder (HCC)   . Borderline diabetes mellitus   . Colon polyp   . Depression   . Dyspnea on exertion   . Esophageal stricture    scheduled for EGD and dilation 03-18-2016  . GAD (generalized anxiety disorder)   . History of acute respiratory failure    12-29-2015  due to polysubstance overdose--  pt intubated --  resolved   . History of febrile seizure    x1  01/ 2017--  none since  . History of kidney stones    staghorn  . History of suicide attempt    12-29-2015  -- overdose klonopin, xanax, alcohol--  acute respirtory failure and acute encenphalopathy -- both resolved  . IBS (irritable bowel syndrome)   . Irregular menstrual cycle   . Renal calculi    bilateral    Past Surgical History:  Procedure Laterality Date  . COLONOSCOPY    . CYSTOSCOPY WITH STENT PLACEMENT Bilateral 03/10/2016   Procedure: CYSTOSCOPY WITH STENT PLACEMENT;  Surgeon: Malen Gauze, MD;  Location: [facility];  Service: Urology;  Laterality: Bilateral;  . CYSTOSCOPY/RETROGRADE/URETEROSCOPY/STONE EXTRACTION WITH BASKET Bilateral  03/10/2016   Procedure: CYSTOSCOPY/RETROGRADE/URETEROSCOPY/STONE EXTRACTION WITH BASKET;  Surgeon: Malen Gauze, MD;  Location: [facility];  Service: Urology;  Laterality: Bilateral;  . EXTRACORPOREAL SHOCK WAVE LITHOTRIPSY  x2    . PERCUTANEOUS NEPHROSTOLITHOTOMY Right 09-18-2005   staghorn  . TUBAL LIGATION  1999       HPI  from the history and physical done on the day of admission:   HISTORY OF PRESENT ILLNESS:  (History was obtained from EMR and detailed acct given by other providers - unable to elicit history from patient due to acute encephalopathy, pt is intubated)  47 yr old patient known to [facility] service admitted in June 17th for Overdose and respiratory failure, PMHx significant for DM, Bipolar disorder (h/o suicidal ideations in the past), Allergic rhinitis, Anxiety, ADHD, and severe GERD s/p esophageal dilatation, presents to [facility] via EMS. It appears that recently patient has been struggling with increased depressive episodes. Emergency services were called to the patient's home by her sons when she was found to be unresponsive.  Pt was intubated in the ED for Acute respiratory  failure.    Hospital Course:     47 yr old F, recently admitted in June (17th) for overdose and respiratory failure.  She has a PMHx significant for DM, Bipolar disorder (h/o suicidal ideations in the past), allergic rhinitis, anxiety, ADHD, and severe GERD s/p esophageal dilatation.   [facility] after being found unresponsive by her children.  It appears that recently the patient has been struggling with increased depressive episodes. The patient was intubated in the ER for airway protection.  Intentional Overdose with Suicidal Ideation, admitted on 07/28/18 to PCCM service   Initial labs-NA 150, K2.8, chloride 118, CO2 22, glucose 130, BUN 12, creatinine 0.81, AST 23, ALT 28, BBC 7, hemoglobin 13.2, and platelets 363.  Initial chest x-ray negative for edema  or infiltrate  Pt was extubated on 07/29/2018, and transferred to hospitalist service on 07/30/2018, patient was discharged to inpatient psychiatric service on 07/2018  Plan:-  1)Acute metabolic Encephalopathy/intentional drug overdose with suicidal ideation and intention----in the setting of presumed intentional overdose, initially requiring intubation on admission.  Patient is now back to baseline, she is awake, very argumentative, threatening to leave AMA, pacing up and down the hallways.  Verbally confrontational at times.  Insisting that she has to go home, requesting to speak with charge nurse and supervisor, refusing to accept psychiatric recommendation that she has to go to inpatient rehab if needed by IVC.  She wants to be able to go home now. One-to-one sitter at bedside, concerns about elopement, patient continues to verbalize plans for possible elopement/leaving AMA.  Very unhappy that she has not been allowed to leave voluntarily and go home.  As per nursing staff patient has a history of leaving AMA previously.  Multiple ED visits/admissions for psychiatric problems and suicidal ideation and overdose attempts in the past.  Psychiatrist recommends starting IVC papers if patient physically tried to leave AGAINST MEDICAL ADVICE.   2) history of alcohol abuse--no evidence of delirium tremens at this time  3)Disposition- Patient has been cleared for transfer to inpatient psychiatric unit, patient is at high risk of elopement/leaving AMA  4) acute hypoxic respiratory failure in the setting of drug overdose and suicidal patient requiring intubation and subsequent extubation, patient is on room air, hypoxic respiratory failure has resolved.       EVENTS  7/31  Admit . - positive 470 since admit. No acute events per RN.  Pt waking some.  ETT 7/31 >>  UDS 7/30 >> negative  ETOH 7/30 >> 169 Salicylate 7/30 >> less than 7 CT Head 7/31 >> negative     Discharge Condition: Medically stable,  however for the next psychiatric standpoint patient is restless , anxious , and not very cooperative at times.  Somewhat talkative.  patient has been cleared for transfer to inpatient psychiatric unit. Late entry note, patient was discharged 07/30/2018,     Consults obtained - PCCM, psych  Diet and Activity recommendation:  As advised  Discharge Instructions     Discharge Instructions    Call MD for:  difficulty breathing, headache or visual disturbances   Complete by:  As directed    Call MD for:  persistant dizziness or light-headedness   Complete by:  As directed    Call MD for:  persistant nausea and vomiting   Complete by:  As directed    Call MD for:  redness, tenderness, or signs of infection (pain, swelling, redness, odor or green/yellow discharge around incision site)  Complete by:  As directed    Call MD for:  temperature >100.4   Complete by:  As directed    Diet general   Complete by:  As directed    Discharge instructions   Complete by:  As directed    Transfer to inpatient psychiatric facility--- patient is at risk for elopement..... Recent suicide attempt with presumed drug overdose   Increase activity slowly   Complete by:  As directed         Discharge Medications     Allergies as of 07/30/2018      Reactions   Fentanyl Hives, Itching   Percocet [oxycodone-acetaminophen] Hives, Itching   Robaxin [methocarbamol] Rash      Medication List    STOP taking these medications   albuterol (2.5 MG/3ML) 0.083% nebulizer solution Commonly known as:  PROVENTIL   buPROPion 100 MG tablet Commonly known as:  WELLBUTRIN   busPIRone 5 MG tablet Commonly known as:  BUSPAR   famotidine 20 MG tablet Commonly known as:  PEPCID   gabapentin 400 MG capsule Commonly known as:  NEURONTIN   hydrOXYzine 25 MG tablet Commonly known as:  ATARAX/VISTARIL   nicotine polacrilex 2 MG gum Commonly known as:  NICORETTE   PARoxetine 20 MG tablet Commonly known as:   PAXIL   PARoxetine 30 MG tablet Commonly known as:  PAXIL   QUEtiapine 200 MG tablet Commonly known as:  SEROQUEL   QUEtiapine 300 MG tablet Commonly known as:  SEROQUEL       Major procedures and Radiology Reports - PLEASE review detailed and final reports for all details, in brief -     Dg Abdomen 1 View  Result Date: 07/28/2018 CLINICAL DATA:  Post intubation, OG position EXAM: ABDOMEN - 1 VIEW COMPARISON:  06/14/2018 FINDINGS: Enteric tube terminates in the gastric antrum. Nonobstructive bowel gas pattern. Visualized osseous structures are within normal limits. IMPRESSION: Enteric tube terminates in the gastric antrum. Electronically Signed   By: Charline Bills M.D.   On: 07/28/2018 00:50   Ct Head Wo Contrast  Result Date: 07/28/2018 CLINICAL DATA:  Initial evaluation for acute altered mental status, overdose. EXAM: CT HEAD WITHOUT CONTRAST TECHNIQUE: Contiguous axial images were obtained from the base of the skull through the vertex without intravenous contrast. COMPARISON:  Prior MRI from 06/15/2018 FINDINGS: Brain: Cerebral volume within normal limits for patient age. No evidence for acute intracranial hemorrhage. No findings to suggest acute large vessel territory infarct. No mass lesion, midline shift, or mass effect. Ventricles are normal in size without evidence for hydrocephalus. No extra-axial fluid collection identified. Vascular: No hyperdense vessel identified. Skull: Scalp soft tissues demonstrate no acute abnormality. Calvarium intact. Sinuses/Orbits: Globes and orbital soft tissues within normal limits. Mild mucosal thickening within the ethmoidal air cells and maxillary sinuses, chronic in appearance. Small right mastoid effusion noted. IMPRESSION: Negative head CT.  No acute intracranial abnormality. Electronically Signed   By: Rise Mu M.D.   On: 07/28/2018 02:38   Dg Chest Port 1 View  Result Date: 07/29/2018 CLINICAL DATA:  Respiratory failure. EXAM:  PORTABLE CHEST 1 VIEW COMPARISON:  07/27/2018. FINDINGS: Mediastinal prominence noted. This may be related to prominent great vessels and patient rotation. Follow-up PA lateral chest x-ray suggested. Endotracheal tube and NG tube noted stable position. Heart size normal. Low lung volumes with basilar infiltrates. No pleural effusion or pneumothorax. IMPRESSION: 1.  Lines and tubes in stable position. 2. Mediastinal prominence is noted. This may be related to pulmonary  venous congestion and patient rotation. With the patient's capable follow-up PA lateral chest x-ray to exclude mediastinal widening suggested. 3. Low lung volumes with basilar/infiltrates. Electronically Signed   By: Maisie Fus  Register   On: 07/29/2018 09:11   Dg Chest Portable 1 View  Result Date: 07/28/2018 CLINICAL DATA:  ETT position EXAM: PORTABLE CHEST 1 VIEW COMPARISON:  06/08/2018 FINDINGS: Endotracheal tube terminates 2 cm above the carina. Enteric tube courses into the distal stomach. Lungs are clear.  No pleural effusion or pneumothorax. Cardiomegaly. IMPRESSION: Endotracheal tube terminates 2 cm above the carina. Enteric tube courses into the distal stomach. Electronically Signed   By: Charline Bills M.D.   On: 07/28/2018 00:50    Micro Results    Recent Results (from the past 240 hour(s))  MRSA PCR Screening     Status: None   Collection Time: 07/28/18  2:47 AM  Result Value Ref Range Status   MRSA by PCR NEGATIVE NEGATIVE Final    Comment:        The GeneXpert MRSA Assay (FDA approved for NASAL specimens only), is one component of a comprehensive MRSA colonization surveillance program. It is not intended to diagnose MRSA infection nor to guide or monitor treatment for MRSA infections. Performed at [facility], [address], [place], [place] [address]        Today   Subjective    Rosselyn Martha today patient is restless , anxious , and not very cooperative at times.  Somewhat  talkative.  Verbally very argumentative, she just wants to go home, she does not want to go to inpatient psychiatric unit          Patient has been seen and examined prior to discharge   Objective   Blood pressure (!) 145/82, pulse 70, temperature 98.5 F (36.9 C), temperature source Oral, resp. rate 18, height 5\' 5"  (1.651 m), weight 98.3 kg (216 lb 11.4 oz), SpO2 94 %.  No intake or output data in the 24 hours ending 08/02/18 1707  Exam Gen:- Awake Alert, restless, anxious HEENT:- [place].AT, No sclera icterus Neck-Supple Neck,No JVD,.  Lungs-  CTAB , good air movement CV- S1, S2 normal, regular Abd-  +ve B.Sounds, Abd Soft, No tenderness,    Extremity/Skin:- No  edema,   good pulses Psych-  oriented x3,  patient is restless , anxious , and not very cooperative at times.  Somewhat talkative.  Verbally very argumentative, she just wants to go home, she does not want to go to inpatient psychiatric unit        Neuro-no new focal deficits, no tremors   Data Review   CBC w Diff:  Lab Results  Component Value Date   WBC 9.5 07/30/2018   HGB 13.0 07/30/2018   HGB 14.3 03/01/2018   HCT 40.1 07/30/2018   HCT 41.3 03/01/2018   PLT 370 07/30/2018   PLT 431 (H) 03/01/2018   LYMPHOPCT 26 07/30/2018   MONOPCT 5 07/30/2018   EOSPCT 3 07/30/2018   BASOPCT 0 07/30/2018    CMP:  Lab Results  Component Value Date   NA 144 07/30/2018   NA 140 03/01/2018   K 3.6 07/30/2018   CL 109 07/30/2018   CO2 26 07/30/2018   BUN 11 07/30/2018   BUN 17 03/01/2018   CREATININE 0.72 07/30/2018   PROT 7.0 07/30/2018   PROT 7.3 03/01/2018   ALBUMIN 3.5 07/30/2018   ALBUMIN 4.9 03/01/2018   BILITOT 0.6 07/30/2018   BILITOT 0.4 03/01/2018  ALKPHOS 78 07/30/2018   AST 17 07/30/2018   ALT 21 07/30/2018   Late entry note, patient was discharged 07/30/2018,   Total Discharge time is about 33 minutes  Shon Hale M.D on 08/02/2018 at 5:07 PM   Go to [email or web address] - password [email or web address] for contact  info  [facility] - Office  [phone]

## 2018-08-02 NOTE — Progress Notes (Signed)
D   Pt reports she is looking forward to discharge tomorrow   She expresses worry over being able to obtain her medications due to financial problems   She is bright and pleasant    She has been interacting well with staff and peers and her behavior is appropriate  A    Verbal support given   Medications administered and effectiveness monitored     Q 15 min checks  R    Pt is safe at present and receptive to verbal support

## 2018-08-02 NOTE — [facility] Suicide Risk Assessment (Signed)
[facility]   Principal Problem: Bipolar disorder, unspecified [facility](HCC) Discharge Diagnoses:  Patient Active Problem List   Diagnosis Date Noted  . Bipolar disorder, unspecified (HCC) [F31.9] 07/30/2018  . Respiratory failure (HCC) [J96.90] 07/28/2018  . Cocaine use disorder, mild, abuse (HCC) [F14.10]   . 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 Bipolar disorder (HCC) [F31.9] 06/18/2018  . Suicide attempt (HCC) [T14.91XA]   . Fatigue [R53.83] 03/01/2018  . Irritability [R45.4] 03/01/2018  . GAD (generalized anxiety disorder) [F41.1] 03/01/2018  . Gingivitis [K05.10] 03/01/2018  . Grinding of teeth [F45.8] 03/01/2018  . Healthcare maintenance [Z00.00] 02/15/2018  . Depression [F32.9] 02/15/2018  . Bipolar 1 disorder (HCC) [F31.9] 02/15/2018  . Cough in adult [R05] 02/15/2018  . Acute bacterial conjunctivitis of right eye [H10.31] 02/15/2018  . Acute respiratory failure (HCC) [J96.00] 01/03/2016  . Acute respiratory failure with hypoxemia (HCC) [J96.01]   . Acute encephalopathy [G93.40]   . Drug overdose [T50.901A] 12/29/2015  . Overdose [T50.901A] 12/29/2015    Total Time spent with patient: 30 minutes  Musculoskeletal: Strength & Muscle Tone: within normal limits Gait & Station: normal Patient leans: N/A  Psychiatric Specialty Exam: Review of Systems  All other systems reviewed and are negative.   Blood pressure 116/76, pulse 87, temperature 98.7 F (37.1 C), temperature source Oral, resp. rate 20, height 5\' 5"  (1.651 m), weight 94.3 kg (208 lb).Body mass index is 34.61 kg/m.  General Appearance: Casual  Eye Contact::  Fair  Speech:  Normal Rate409  Volume:  Normal  Mood:  Anxious  Affect:  Appropriate  Thought Process:  Coherent  Orientation:  Full (Time, Place, and Person)  Thought Content:  Logical  Suicidal Thoughts:  No  Homicidal Thoughts:  No  Memory:  Immediate;   Fair Recent;   Fair Remote;    Fair  Judgement:  Intact  Insight:  Lacking  Psychomotor Activity:  Normal  Concentration:  Fair  Recall:  Fis[employer]Fair  Fund of Knowledge:Fair  Language: Good  Akathisia:  Negative  Handed:  Right  AIMS (if indicated):     Assets:  Desire for Improvement Housing Physical Health Resilience  Sleep:  Number of Hours: 5.25  Cognition: WNL  ADL's:  Intact   Mental Status Per Nursing Assessment::   On Admission:  Suicidal ideation indicated by others  Demographic Factors:  Divorced or widowed, Caucasian and Unemployed  Loss Factors: NA  Historical Factors: Prior suicide attempts and Impulsivity  Risk Reduction Factors:   Sense of responsibility to family and Living with another person, especially a relative  Continued Clinical Symptoms:  Bipolar Disorder:   Bipolar II Alcohol/Substance Abuse/Dependencies  Cognitive Features That Contribute To Risk:  None    Suicide Risk:  Minimal: No identifiable suicidal ideation.  Patients presenting with no risk factors but with morbid ruminations; may be classified as minimal risk based on the severity of the depressive symptoms  Follow-up Information    [facility] Follow up.   Specialty:  [facility] Contact information: [address] [place] [address] [phone]           Plan Of Care/Follow-up recommendations:  Activity:  ad lib  Antonieta PertGreg Lawson Clary, MD 08/02/2018, 2:29 PM

## 2018-08-02 NOTE — Progress Notes (Signed)
  [facility] :  Will you be returning to the same living situation after discharge:  Yes,  home At discharge, do you have transportation home?: Yes,  family member--pt is planning to discharge at 8am on Tuesday, 8/6 when 72 hour request for discharge expires.  Do you have the ability to pay for your medications: Yes,  mental health  Release of information consent forms completed and submitted to medical records by CSW.  Patient to Follow up at: Follow-up Information    [facility] Follow up on 08/09/2018.   Specialty:  Behavioral Health Why:  Assessment for therapy on Monday 8/12 at 1:30PM with Andrena Mewsave Traeford. Please bring: photo ID. Please call within 48 hours of scheduled appt to cancel or reschedule if necessary. Thank you! Contact information: [address] [place] [address] [phone]        [facility] Follow up on 08/04/2018.   Why:  Hospital follow-up/medication management at 10:15AM on Wednesday, 8/7 with Ayesha MohairKatie Danford NP. Thank you.  Contact information: [address] [place] [phone]          Next level of care provider has access to [facility] Link:no  Safety Planning and Suicide Prevention discussed: Yes,  SPE completed with pt; pt declined to consent to collateral contact. SPI pamphlet and Mobile Crisis information provided.   Have you used any form of tobacco in the last 30 days? (Cigarettes, Smokeless Tobacco, Cigars, and/or Pipes): Yes  Has patient been referred to the Quitline?: Patient refused referral  Patient has been referred for addiction treatment: Yes  Rona RavensHeather S Jhoan Schmieder, LCSW 08/02/2018, 2:58 PM

## 2018-08-02 NOTE — Tx Team (Addendum)
Interdisciplinary Treatment and Diagnostic Plan Update  08/02/2018 Time of Session: [id number] Stacie Sanders MRN: [id number]  Principal Diagnosis: Bipolar disorder, unspecified (HCC)  Secondary Diagnoses: Principal Problem:   Bipolar disorder, unspecified (HCC)   Current Medications:  Current Facility-Administered Medications  Medication Dose Route Frequency Provider Last Rate Last Dose  . alum & mag hydroxide-simeth (MAALOX/MYLANTA) 200-200-20 MG/5ML suspension 30 mL  30 mL Oral Q4H PRN Money, Gerlene Burdockravis B, FNP   30 mL at 08/01/18 1922  . dextromethorphan-guaiFENesin (MUCINEX DM) 30-600 MG per 12 hr tablet 1 tablet  1 tablet Oral BID Kerry HoughSimon, Spencer E, PA-C   1 tablet at 08/01/18 1659  . hydrOXYzine (ATARAX/VISTARIL) tablet 50 mg  50 mg Oral TID PRN Money, Gerlene Burdockravis B, FNP   50 mg at 08/01/18 2158  . ibuprofen (ADVIL,MOTRIN) tablet 400 mg  400 mg Oral Q6H PRN Money, Gerlene Burdockravis B, FNP      . loratadine (CLARITIN) tablet 10 mg  10 mg Oral Daily Money, Gerlene Burdockravis B, FNP   10 mg at 08/01/18 1316  . LORazepam (ATIVAN) tablet 1 mg  1 mg Oral Q6H PRN Antonieta Pertlary, Greg Lawson, MD   1 mg at 08/01/18 1922  . magnesium hydroxide (MILK OF MAGNESIA) suspension 30 mL  30 mL Oral Daily PRN Money, Feliz Beamravis B, FNP      . nicotine polacrilex (NICORETTE) gum 2 mg  2 mg Oral PRN Donell SievertSimon, Spencer E, PA-C   2 mg at 08/01/18 1317  . PARoxetine (PAXIL) tablet 10 mg  10 mg Oral Daily Antonieta Pertlary, Greg Lawson, MD   10 mg at 08/01/18 0753  . traZODone (DESYREL) tablet 100 mg  100 mg Oral QHS PRN,MR X 1 Money, Gerlene Burdockravis B, FNP   100 mg at 08/01/18 2158   PTA Medications: No medications prior to admission.    Patient Stressors: Financial difficulties Marital or family conflict Substance abuse  Patient Strengths: Active sense of humor Average or above average intelligence Capable of independent living General fund of knowledge Motivation for treatment/growth  Treatment Modalities: Medication Management, Group therapy, Case management,  1 to  1 session with clinician, Psychoeducation, Recreational therapy.   Physician Treatment Plan for Primary Diagnosis: Bipolar disorder, unspecified (HCC) Long Term Goal(s): Improvement in symptoms so as ready for discharge Improvement in symptoms so as ready for discharge   Short Term Goals: Ability to disclose and discuss suicidal ideas Ability to demonstrate self-control will improve Ability to identify and develop effective coping behaviors will improve Ability to identify and develop effective coping behaviors will improve Ability to maintain clinical measurements within normal limits will improve Compliance with prescribed medications will improve  Medication Management: Evaluate patient's response, side effects, and tolerance of medication regimen.  Therapeutic Interventions: 1 to 1 sessions, Unit Group sessions and Medication administration.  Evaluation of Outcomes: Progressing  Physician Treatment Plan for Secondary Diagnosis: Principal Problem:   Bipolar disorder, unspecified (HCC)  Long Term Goal(s): Improvement in symptoms so as ready for discharge Improvement in symptoms so as ready for discharge   Short Term Goals: Ability to disclose and discuss suicidal ideas Ability to demonstrate self-control will improve Ability to identify and develop effective coping behaviors will improve Ability to identify and develop effective coping behaviors will improve Ability to maintain clinical measurements within normal limits will improve Compliance with prescribed medications will improve     Medication Management: Evaluate patient's response, side effects, and tolerance of medication regimen.  Therapeutic Interventions: 1 to 1 sessions, Unit Group sessions and Medication administration.  Evaluation of Outcomes: Progressing   RN Treatment Plan for Primary Diagnosis: Bipolar disorder, unspecified (HCC) Long Term Goal(s): Knowledge of disease and therapeutic regimen to maintain  health will improve  Short Term Goals: Ability to remain free from injury will improve, Ability to disclose and discuss suicidal ideas and Ability to identify and develop effective coping behaviors will improve  Medication Management: RN will administer medications as ordered by provider, will assess and evaluate patient's response and provide education to patient for prescribed medication. RN will report any adverse and/or side effects to prescribing provider.  Therapeutic Interventions: 1 on 1 counseling sessions, Psychoeducation, Medication administration, Evaluate responses to treatment, Monitor vital signs and CBGs as ordered, Perform/monitor CIWA, COWS, AIMS and Fall Risk screenings as ordered, Perform wound care treatments as ordered.  Evaluation of Outcomes: Progressing   LCSW Treatment Plan for Primary Diagnosis: Bipolar disorder, unspecified (HCC) Long Term Goal(s): Safe transition to appropriate next level of care at discharge, Engage patient in therapeutic group addressing interpersonal concerns.  Short Term Goals: Engage patient in aftercare planning with referrals and resources, Facilitate patient progression through stages of change regarding substance use diagnoses and concerns and Identify triggers associated with mental health/substance abuse issues  Therapeutic Interventions: Assess for all discharge needs, 1 to 1 time with Social worker, Explore available resources and support systems, Assess for adequacy in community support network, Educate family and significant other(s) on suicide prevention, Complete Psychosocial Assessment, Interpersonal group therapy.  Evaluation of Outcomes: Progressing   Progress in Treatment: Attending groups: Yes. Participating in groups: Yes. Taking medication as prescribed: Yes. Toleration medication: Yes. Family/Significant other contact made: No, will contact:  family member if pt consents to collateral contact. Patient understands  diagnosis: Yes. Discussing patient identified problems/goals with staff: Yes. Medical problems stabilized or resolved: Yes. Denies suicidal/homicidal ideation: Yes. Issues/concerns per patient self-inventory: No. Other: n/a   New problem(s) identified: No, Describe:  n/a  New Short Term/Long Term Goal(s): detox, medication management for mood stabilization; elimination of SI thoughts; development of comprehensive mental wellness/sobriety plan.   Patient Goals:  "To pick myself up and get myself together."   Discharge Plan or Barriers: CSW assessing for appropriate referrals. [facility] pamphlet, Mobile Crisis information, and AA/NA information provided to patient for additional community support and resources.    Reason for Continuation of Hospitalization: Anxiety Depression Medication stabilization Suicidal ideation Withdrawal symptoms  Estimated Length of Stay: Wed, 08/04/18  Attendees: Patient: 08/02/2018 8:52 AM  Physician: Dr. Jola Babinski MD; Dr. Altamese Farrell MD 08/02/2018 8:52 AM  Nursing: Meriam Sprague RN; Casimiro Needle RN 08/02/2018 8:52 AM  RN Care Manager:x 08/02/2018 8:52 AM  Social Worker: Corrie Mckusick LCSW 08/02/2018 8:52 AM  Recreational Therapist: x 08/02/2018 8:52 AM  Other: Hillery Jacks NP; Renold Don NP 08/02/2018 8:52 AM  Other:  08/02/2018 8:52 AM  Other: 08/02/2018 8:52 AM    Scribe for Treatment Team: Rona Ravens, LCSW 08/02/2018 8:52 AM

## 2018-08-02 NOTE — Progress Notes (Signed)
Recreation Therapy Notes  Date: 8.5.19 Time: 0930 Location: [address] Dayroom  Group Topic: Stress Management  Goal Area(s) Addresses:  Patient will verbalize importance of using healthy stress management.  Patient will identify positive emotions associated with healthy stress management.   Intervention: Stress Management  Activity : Guided Imagery.  LRT introduced the stress management technique of guided imagery.  LRT read a script to lead patients on a journey through a meadow.  Patients were to follow along as script was read.  Education: Stress Management, Discharge Planning.   Education Outcome: Acknowledges edcuation/In group clarification offered/Needs additional education  Clinical Observations/Feedback: Pt did not attend group.    Aaryn Parrilla, LRT/CTRS         Arantxa Piercey A 08/02/2018 11:20 AM 

## 2018-08-02 NOTE — Progress Notes (Signed)
Writer has spoken with patient who was c/o during shift change that she was having a burning sensation around her chest area and she was becoming very anxious. Vitals were taken and documented in epic which were wnl. Writer gave her 1 mg ativan and maalox for possible indigestion. She requested to take a hot bath which she did and later attended group. She had spoken withwriter about her talking to her sister on today and how she chewed her out about getting herself together for the kids. Writer feels she had a lot of built up anxiety because of what her sister had talked to her about and the fact that she had no visitors today. Writer spent most of visitation time talking with patient to help decrease her anxiety. Safety maintained on unit with 15 min checks.

## 2018-08-03 ENCOUNTER — Encounter (HOSPITAL_COMMUNITY): Payer: Self-pay | Admitting: Behavioral Health

## 2018-08-03 NOTE — Plan of Care (Signed)
Discharge Summary   Patient verbalizes readiness for discharge. Follow up plan explained, AVS, Transition record and SRA given. Prescriptions and teaching provided. Belongings returned and signed for. Suicide safety plan completed and signed. Patient verbalizes understanding. Patient denies SI/HI and assures this Clinical research associatewriter he will seek assistance should that change. Patient discharged to lobby with bus pass.  Problem: Education: Goal: Knowledge of [place] [facility] Education information/materials will improve Outcome: Adequate for Discharge Goal: Emotional status will improve Outcome: Adequate for Discharge Goal: Mental status will improve Outcome: Adequate for Discharge Goal: Verbalization of understanding the information provided will improve Outcome: Adequate for Discharge   Problem: Activity: Goal: Interest or engagement in activities will improve Outcome: Adequate for Discharge Goal: Sleeping patterns will improve Outcome: Adequate for Discharge   Problem: Coping: Goal: Ability to verbalize frustrations and anger appropriately will improve Outcome: Adequate for Discharge Goal: Ability to demonstrate self-control will improve Outcome: Adequate for Discharge   Problem: Health Behavior/Discharge Planning: Goal: Identification of resources available to assist in meeting health care needs will improve Outcome: Adequate for Discharge Goal: Compliance with treatment plan for underlying cause of condition will improve Outcome: Adequate for Discharge   Problem: Physical Regulation: Goal: Ability to maintain clinical measurements within normal limits will improve Outcome: Adequate for Discharge   Problem: Safety: Goal: Periods of time without injury will increase Outcome: Adequate for Discharge   Problem: Education: Goal: Utilization of techniques to improve thought processes will improve Outcome: Adequate for Discharge Goal: Knowledge of the prescribed therapeutic regimen will  improve Outcome: Adequate for Discharge   Problem: Activity: Goal: Interest or engagement in leisure activities will improve Outcome: Adequate for Discharge Goal: Imbalance in normal sleep/wake cycle will improve Outcome: Adequate for Discharge   Problem: Coping: Goal: Coping ability will improve Outcome: Adequate for Discharge Goal: Will verbalize feelings Outcome: Adequate for Discharge   Problem: Health Behavior/Discharge Planning: Goal: Ability to make decisions will improve Outcome: Adequate for Discharge Goal: Compliance with therapeutic regimen will improve Outcome: Adequate for Discharge   Problem: Role Relationship: Goal: Will demonstrate positive changes in social behaviors and relationships Outcome: Adequate for Discharge   Problem: Safety: Goal: Ability to disclose and discuss suicidal ideas will improve Outcome: Adequate for Discharge Goal: Ability to identify and utilize support systems that promote safety will improve Outcome: Adequate for Discharge   Problem: Self-Concept: Goal: Will verbalize positive feelings about self Outcome: Adequate for Discharge Goal: Level of anxiety will decrease Outcome: Adequate for Discharge   Problem: Education: Goal: Utilization of techniques to improve thought processes will improve Outcome: Adequate for Discharge Goal: Knowledge of the prescribed therapeutic regimen will improve Outcome: Adequate for Discharge   Problem: Activity: Goal: Interest or engagement in leisure activities will improve Outcome: Adequate for Discharge Goal: Imbalance in normal sleep/wake cycle will improve Outcome: Adequate for Discharge   Problem: Coping: Goal: Coping ability will improve Outcome: Adequate for Discharge Goal: Will verbalize feelings Outcome: Adequate for Discharge   Problem: Health Behavior/Discharge Planning: Goal: Ability to make decisions will improve Outcome: Adequate for Discharge Goal: Compliance with therapeutic  regimen will improve Outcome: Adequate for Discharge   Problem: Role Relationship: Goal: Will demonstrate positive changes in social behaviors and relationships Outcome: Adequate for Discharge   Problem: Safety: Goal: Ability to disclose and discuss suicidal ideas will improve Outcome: Adequate for Discharge Goal: Ability to identify and utilize support systems that promote safety will improve Outcome: Adequate for Discharge   Problem: Self-Concept: Goal: Will verbalize positive feelings about self  Outcome: Adequate for Discharge Goal: Level of anxiety will decrease Outcome: Adequate for Discharge

## 2018-08-03 NOTE — Progress Notes (Signed)
Subjective:    Patient ID: Stacie Sanders, female    DOB: 22-Oct-1971, 47 y.o.   MRN: [id number]  HPI:   Stacie Sanders is here for hospital follow-up-   06/14/18-06/18/18- "prior history of bipolar disorder, anxiety who presented to the hospital for a suicide attempt secondary to a drug overdose (BuSpar, Neurontin, Paxil, Seroquel, Vistaril as well as EtOH). She was very groggy and drowsy when she was brought to the hospital, she subsequently had a seizure in the emergency room, she was intubated and admitted to the intensive care unit. Subsequently stabilized-underwent neurology evaluation-extubated on 6/19, and transferred to the trial hospitalist service on 6/21"  07/07/18- 07/30/18- "47 yr [facility] for Overdose and respiratory failure, PMHx significant forDM, Bipolar disorder(h/o suicidal ideations in the past), Allergic rhinitis, Anxiety, ADHD, andsevere GERD s/p esophageal dilatation,[facility].It appears that recently patient has been struggling with increased depressive episodes.Emergency services were called to the patient's home by her sons when she was found to be unresponsive. Pt was intubated in the ED for Acute respiratory failure".  07/30/18-08/03/18- she self enrolled herself into [facility] She reports attending "Grief Counseling", AA, NA while at facility and plans on continuing with local AA, NA chapters  Issues that have caused her to attempt suicide- 1) chronic cocaine use, and dramatic increase in ETOH use the last two months 2) Husband's recent infidelity and subsequent separation June 2018 Despite repeated attempts she has not spoken with him or seen him since mid June.  3)  Three + home pregnancy tests (hospital pregnancy test was neg) 4) Two recent overdose's in attempt to gain attention of her husband and "to bring him home" 5) Financial instability, she has not worked outside the house since  2013 and completely dependent on her husband who has not sent her money or paid mortgage in months. 6) lack of social support, she reports only one friend and no local family.  Her youngest son  (age 45) has been staying with friends and "took my dog with him". 7) [facility] and has not wish to return for therapy/rx management  She has appt with [facility] this Monday 08/09/18  Patient Care Team    Relationship Specialty Notifications Start End  Julaine Fusianford, Awais Cobarrubias D, NP PCP - General Family Medicine  02/15/18   [facility]    02/15/18   Trey SailorsPa, [facility]  Family Medicine  02/15/18   [facility]    02/15/18     Patient Active Problem List   Diagnosis Date Noted  . Bipolar disorder, unspecified (HCC) 07/30/2018  . Respiratory failure (HCC) 07/28/2018  . Cocaine use disorder, mild, abuse (HCC)   . 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 Bipolar disorder (HCC) 06/18/2018  . Suicide attempt (HCC)   . Fatigue 03/01/2018  . Irritability 03/01/2018  . GAD (generalized anxiety disorder) 03/01/2018  . Gingivitis 03/01/2018  . Grinding of teeth 03/01/2018  . Healthcare maintenance 02/15/2018  . Depression 02/15/2018  . Bipolar 1 disorder (HCC) 02/15/2018  . Cough in adult 02/15/2018  . Acute bacterial conjunctivitis of right eye 02/15/2018  . Acute respiratory failure (HCC) 01/03/2016  . Acute respiratory failure with hypoxemia (HCC)   . Acute encephalopathy   . Drug overdose 12/29/2015  . Overdose 12/29/2015     Past Medical History:  Diagnosis Date  . Allergic  rhinitis   . Bipolar 1 disorder (HCC)   . Borderline diabetes mellitus   . Colon polyp   . Depression   . Dyspnea on exertion   . Esophageal stricture    scheduled for EGD and dilation 03-18-2016  . GAD (generalized anxiety disorder)   . History of acute respiratory failure    12-29-2015  due to polysubstance  overdose--  pt intubated --  resolved   . History of febrile seizure    x1  01/ 2017--  none since  . History of kidney stones    staghorn  . History of suicide attempt    12-29-2015  -- overdose klonopin, xanax, alcohol--  acute respirtory failure and acute encenphalopathy -- both resolved  . IBS (irritable bowel syndrome)   . Irregular menstrual cycle   . Renal calculi    bilateral     Past Surgical History:  Procedure Laterality Date  . COLONOSCOPY    . CYSTOSCOPY WITH STENT PLACEMENT Bilateral 03/10/2016   Procedure: CYSTOSCOPY WITH STENT PLACEMENT;  Surgeon: Malen Gauze, MD;  Location: [facility];  Service: Urology;  Laterality: Bilateral;  . CYSTOSCOPY/RETROGRADE/URETEROSCOPY/STONE EXTRACTION WITH BASKET Bilateral 03/10/2016   Procedure: CYSTOSCOPY/RETROGRADE/URETEROSCOPY/STONE EXTRACTION WITH BASKET;  Surgeon: Malen Gauze, MD;  Location: [facility];  Service: Urology;  Laterality: Bilateral;  . EXTRACORPOREAL SHOCK WAVE LITHOTRIPSY  x2    . PERCUTANEOUS NEPHROSTOLITHOTOMY Right 09-18-2005   staghorn  . TUBAL LIGATION  1999     Family History  Problem Relation Age of Onset  . Cancer Father        pancreas  . Melanoma Father   . Cancer Maternal Grandmother        lung  . COPD Paternal Grandmother   . Melanoma Paternal Grandmother      Social History   Substance and Sexual Activity  Drug Use No     Social History   Substance and Sexual Activity  Alcohol Use Yes   Comment: occ     Social History   Tobacco Use  Smoking Status Current Every Day Smoker  . Packs/day: 0.50  . Years: 20.00  . Pack years: 10.00  . Types: Cigarettes  Smokeless Tobacco Never Used     Outpatient Encounter Medications as of 08/04/2018  Medication Sig  . hydrOXYzine (ATARAX/VISTARIL) 50 MG tablet Take 1 tablet (50 mg total) by mouth 3 (three) times daily as needed for anxiety.  Marland Kitchen PARoxetine (PAXIL) 10 MG tablet Take 1 tablet (10 mg  total) by mouth daily.  . traZODone (DESYREL) 100 MG tablet Take 1 tablet (100 mg total) by mouth at bedtime as needed and may repeat dose one time if needed for sleep.  . [DISCONTINUED] hydrOXYzine (ATARAX/VISTARIL) 50 MG tablet Take 1 tablet (50 mg total) by mouth 3 (three) times daily as needed for anxiety.  . [DISCONTINUED] PARoxetine (PAXIL) 10 MG tablet Take 1 tablet (10 mg total) by mouth daily.  . [DISCONTINUED] traZODone (DESYREL) 100 MG tablet Take 1 tablet (100 mg total) by mouth at bedtime as needed and may repeat dose one time if needed for sleep.  Marland Kitchen ibuprofen (ADVIL,MOTRIN) 600 MG tablet Take 1 tablet (600 mg total) by mouth every 8 (eight) hours as needed.  Marland Kitchen omeprazole (PRILOSEC) 40 MG capsule Take 1 capsule (40 mg total) by mouth daily.  . [DISCONTINUED] alum & mag hydroxide-simeth (MAALOX/MYLANTA) 200-200-20 MG/5ML suspension 30 mL   . [DISCONTINUED] dextromethorphan-guaiFENesin (MUCINEX DM) 30-600 MG per 12 hr tablet 1  tablet   . [DISCONTINUED] hydrOXYzine (ATARAX/VISTARIL) tablet 50 mg   . [DISCONTINUED] ibuprofen (ADVIL,MOTRIN) tablet 400 mg   . [DISCONTINUED] loratadine (CLARITIN) tablet 10 mg   . [DISCONTINUED] LORazepam (ATIVAN) tablet 1 mg   . [DISCONTINUED] magnesium hydroxide (MILK OF MAGNESIA) suspension 30 mL   . [DISCONTINUED] nicotine polacrilex (NICORETTE) gum 2 mg   . [DISCONTINUED] PARoxetine (PAXIL) tablet 10 mg   . [DISCONTINUED] traZODone (DESYREL) tablet 100 mg    No facility-administered encounter medications on file as of 08/04/2018.     Allergies: Fentanyl; Percocet [oxycodone-acetaminophen]; and Robaxin [methocarbamol]  Body mass index is 33.76 kg/m.  Blood pressure 121/80, pulse 94, height 5\' 5"  (1.651 m), weight 202 lb 14.4 oz (92 kg), last menstrual period 04/28/2018, SpO2 97 %.  Review of Systems  Constitutional: Positive for activity change and fatigue. Negative for appetite change, chills, diaphoresis, fever and unexpected weight change.   Eyes: Negative for visual disturbance.  Respiratory: Negative for cough, chest tightness, shortness of breath, wheezing and stridor.   Cardiovascular: Negative for chest pain, palpitations and leg swelling.  Gastrointestinal: Negative for abdominal distention, abdominal pain, blood in stool, constipation, diarrhea, nausea and vomiting.  Genitourinary: Negative for difficulty urinating and flank pain.  Musculoskeletal: Positive for arthralgias, back pain, myalgias, neck pain and neck stiffness. Negative for gait problem.  Skin: Negative for color change, pallor, rash and wound.  Neurological: Positive for headaches. Negative for dizziness.  Hematological: Does not bruise/bleed easily.  Psychiatric/Behavioral: Positive for agitation, dysphoric mood and sleep disturbance. Negative for behavioral problems, confusion, decreased concentration, hallucinations, self-injury and suicidal ideas. The patient is nervous/anxious. The patient is not hyperactive.        Objective:   Physical Exam  Constitutional: She is oriented to person, place, and time. She appears well-developed and well-nourished. No distress.  HENT:  Head: Normocephalic and atraumatic.  Right Ear: External ear normal.  Left Ear: External ear normal.  Nose: Nose normal.  Mouth/Throat: Oropharynx is clear and moist.  Cardiovascular: Normal rate, regular rhythm, normal heart sounds and intact distal pulses.  No murmur heard. Pulmonary/Chest: Effort normal and breath sounds normal. No stridor. No respiratory distress. She has no wheezes. She exhibits no tenderness.  Neurological: She is alert and oriented to person, place, and time.  Skin: Skin is warm and dry. Capillary refill takes less than 2 seconds. No rash noted. She is not diaphoretic. No erythema. No pallor.  Psychiatric: Her speech is normal and behavior is normal. Judgment and thought content normal. Her mood appears anxious. Cognition and memory are normal.       Assessment & Plan:   1. Suicide attempt (HCC)   2. GAD (generalized anxiety disorder)   3. Healthcare maintenance   4. Depression, unspecified depression type     Healthcare maintenance 1) Medications sent to [facility] to utilize Rx Prorgam 2) Please keep your therapy appt on Monday- [facility].  Please ask if there is a  Psychiatrist within the practice that an manage her medications.  If not, please call clinic and we will place urgent referral. 3) Increase water intake and eat a heart healthy diet. List of [facility] [employer] provided. 4) Please contact [place] [employer] program to secure assistance with your legal matters. 5) To find local AA meeting [phone] 6) List provided for local NA meetings. 7) Walk/exercise as often as possible. Call clinic with any questions/concerns. Follow-up in 3 months, sooner if needed.  Depression Continue Paroxetine 10mg  QD Only 30  day RF provided Mental health appt on 08/09/18  GAD (generalized anxiety disorder) Continue Paroxetine 10mg  QD Only 30 day RF provided Mental health appt on 08/09/18  Suicide attempt [facility]) She denies thoughts of harming herself/others Hx of substance abuse Last ETOH ingestion was 07/27/18 Last narcotic use was June 2019 She is actively attending NA/AA meetings    FOLLOW-UP:  Return in about 3 months (around 11/04/2018) for Regular Follow Up.

## 2018-08-03 NOTE — Progress Notes (Signed)
Pt reports she does not have a ride tomorrow and does not have any clothes to wear if she was to take the bus

## 2018-08-04 ENCOUNTER — Ambulatory Visit (INDEPENDENT_AMBULATORY_CARE_PROVIDER_SITE_OTHER): Payer: Self-pay | Admitting: Adult Health

## 2018-08-04 ENCOUNTER — Encounter: Payer: Self-pay | Admitting: Adult Health

## 2018-08-04 VITALS — BP 121/80 | HR 94 | Ht 65.0 in | Wt 202.9 lb

## 2018-08-04 DIAGNOSIS — F411 Generalized anxiety disorder: Secondary | ICD-10-CM

## 2018-08-04 DIAGNOSIS — F32A Depression, unspecified: Secondary | ICD-10-CM

## 2018-08-04 DIAGNOSIS — T1491XA Suicide attempt, initial encounter: Secondary | ICD-10-CM

## 2018-08-04 DIAGNOSIS — Z0000 Encounter for general adult medical examination without abnormal findings: Secondary | ICD-10-CM

## 2018-08-04 DIAGNOSIS — F329 Major depressive disorder, single episode, unspecified: Secondary | ICD-10-CM

## 2018-08-04 MED ORDER — PAROXETINE HCL 10 MG PO TABS: 10.0000 mg | ORAL_TABLET | Freq: Every day | ORAL | 2 refills | Status: DC

## 2018-08-04 MED ORDER — TRAZODONE HCL 100 MG PO TABS: 100.0000 mg | ORAL_TABLET | Freq: Every evening | ORAL | 2 refills | Status: DC | PRN

## 2018-08-04 MED ORDER — OMEPRAZOLE 40 MG PO CPDR: 40.0000 mg | DELAYED_RELEASE_CAPSULE | Freq: Every day | ORAL | 3 refills | Status: DC

## 2018-08-04 MED ORDER — IBUPROFEN 600 MG PO TABS: 600.0000 mg | ORAL_TABLET | Freq: Three times a day (TID) | ORAL | 2 refills | Status: AC | PRN

## 2018-08-04 MED ORDER — HYDROXYZINE HCL 50 MG PO TABS: 50.0000 mg | ORAL_TABLET | Freq: Three times a day (TID) | ORAL | 2 refills | Status: DC | PRN

## 2018-08-04 NOTE — Assessment & Plan Note (Signed)
Continue Paroxetine 10mg  QD Only 30 day RF provided Mental health appt on 08/09/18

## 2018-08-04 NOTE — Assessment & Plan Note (Signed)
She denies thoughts of harming herself/others Hx of substance abuse Last ETOH ingestion was 07/27/18 Last narcotic use was June 2019 She is actively attending NA/AA meetings

## 2018-08-04 NOTE — Assessment & Plan Note (Signed)
1) Medications sent to [facility] to utilize Rx Prorgam 2) Please keep your therapy appt on Monday- [facility].  Please ask if there is a  Psychiatrist within the practice that an manage her medications.  If not, please call clinic and we will place urgent referral. 3) Increase water intake and eat a heart healthy diet. List of [facility] [employer] provided. 4) Please contact [place] [employer] Law program to secure assistance with your legal matters. 5) To find local AA meeting [phone] 6) List provided for local NA meetings. 7) Walk/exercise as often as possible. Call clinic with any questions/concerns. Follow-up in 3 months, sooner if needed.

## 2018-08-04 NOTE — Assessment & Plan Note (Signed)
Continue Paroxetine 10mg QD Only 30 day RF provided Mental health appt on 08/09/18 

## 2018-08-04 NOTE — Patient Instructions (Addendum)
Generalized Anxiety Disorder, Adult Generalized anxiety disorder (GAD) is a mental health disorder. People with this condition constantly worry about everyday events. Unlike normal anxiety, worry related to GAD is not triggered by a specific event. These worries also do not fade or get better with time. GAD interferes with life functions, including relationships, work, and school. GAD can vary from mild to severe. People with severe GAD can have intense waves of anxiety with physical symptoms (panic attacks). What are the causes? The exact cause of GAD is not known. What increases the risk? This condition is more likely to develop in:  Women.  People who have a family history of anxiety disorders.  People who are very shy.  People who experience very stressful life events, such as the death of a loved one.  People who have a very stressful family environment.  What are the signs or symptoms? People with GAD often worry excessively about many things in their lives, such as their health and family. They may also be overly concerned about:  Doing well at work.  Being on time.  Natural disasters.  Friendships.  Physical symptoms of GAD include:  Fatigue.  Muscle tension or having muscle twitches.  Trembling or feeling shaky.  Being easily startled.  Feeling like your heart is pounding or racing.  Feeling out of breath or like you cannot take a deep breath.  Having trouble falling asleep or staying asleep.  Sweating.  Nausea, diarrhea, or irritable bowel syndrome (IBS).  Headaches.  Trouble concentrating or remembering facts.  Restlessness.  Irritability.  How is this diagnosed? Your health care provider can diagnose GAD based on your symptoms and medical history. You will also have a physical exam. The health care provider will ask specific questions about your symptoms, including how severe they are, when they started, and if they come and go. Your health care  provider may ask you about your use of alcohol or drugs, including prescription medicines. Your health care provider may refer you to a mental health specialist for further evaluation. Your health care provider will do a thorough examination and may perform additional tests to rule out other possible causes of your symptoms. To be diagnosed with GAD, a person must have anxiety that:  Is out of his or her control.  Affects several different aspects of his or her life, such as work and relationships.  Causes distress that makes him or her unable to take part in normal activities.  Includes at least three physical symptoms of GAD, such as restlessness, fatigue, trouble concentrating, irritability, muscle tension, or sleep problems.  Before your health care provider can confirm a diagnosis of GAD, these symptoms must be present more days than they are not, and they must last for six months or longer. How is this treated? The following therapies are usually used to treat GAD:  Medicine. Antidepressant medicine is usually prescribed for long-term daily control. Antianxiety medicines may be added in severe cases, especially when panic attacks occur.  Talk therapy (psychotherapy). Certain types of talk therapy can be helpful in treating GAD by providing support, education, and guidance. Options include: ? Cognitive behavioral therapy (CBT). People learn coping skills and techniques to ease their anxiety. They learn to identify unrealistic or negative thoughts and behaviors and to replace them with positive ones. ? Acceptance and commitment therapy (ACT). This treatment teaches people how to be mindful as a way to cope with unwanted thoughts and feelings. ? Biofeedback. This process trains you to   manage your body's response (physiological response) through breathing techniques and relaxation methods. You will work with a therapist while machines are used to monitor your physical symptoms.  Stress  management techniques. These include yoga, meditation, and exercise.  A mental health specialist can help determine which treatment is best for you. Some people see improvement with one type of therapy. However, other people require a combination of therapies. Follow these instructions at home:  Take over-the-counter and prescription medicines only as told by your health care provider.  Try to maintain a normal routine.  Try to anticipate stressful situations and allow extra time to manage them.  Practice any stress management or self-calming techniques as taught by your health care provider.  Do not punish yourself for setbacks or for not making progress.  Try to recognize your accomplishments, even if they are small.  Keep all follow-up visits as told by your health care provider. This is important. Contact a health care provider if:  Your symptoms do not get better.  Your symptoms get worse.  You have signs of depression, such as: ? A persistently sad, cranky, or irritable mood. ? Loss of enjoyment in activities that used to bring you joy. ? Change in weight or eating. ? Changes in sleeping habits. ? Avoiding friends or family members. ? Loss of energy for normal tasks. ? Feelings of guilt or worthlessness. Get help right away if:  You have serious thoughts about hurting yourself or others. If you ever feel like you may hurt yourself or others, or have thoughts about taking your own life, get help right away. You can go to your nearest emergency department or call:  Your local emergency services (911 in the [place]  A suicide crisis helpline, such as the National Suicide Prevention Lifeline at (865)561-73831-475-186-2503. This is open 24 hours a day.  Summary  Generalized anxiety disorder (GAD) is a mental health disorder that involves worry that is not triggered by a specific event.  People with GAD often worry excessively about many things in their lives, such as their health and  family.  GAD may cause physical symptoms such as restlessness, trouble concentrating, sleep problems, frequent sweating, nausea, diarrhea, headaches, and trembling or muscle twitching.  A mental health specialist can help determine which treatment is best for you. Some people see improvement with one type of therapy. However, other people require a combination of therapies. This information is not intended to replace advice given to you by your health care provider. Make sure you discuss any questions you have with your health care provider. Document Released: 04/11/2013 Document Revised: 11/04/2016 Document Reviewed: 11/04/2016 Elsevier Interactive Patient Education  2018 [employer]  1) Medications sent to [facility] to utilize Rx Prorgam 2) Please keep your therapy appt on Monday- [facility].  Please ask if there is a  Psychiatrist within the practice that an manage her medications.  If not, please call clinic and we will place urgent referral. 3) Increase water intake and eat a heart healthy diet. List of [facility] [employer] provided. 4) Please contact [place] [employer] program to secure assistance with your legal matters. 5) To find local AA meeting [phone] 6) List provided for local NA meetings. 7) Walk/exercise as often as possible. Call clinic with any questions/concerns. Follow-up in 3 months, sooner if needed.

## 2018-09-01 ENCOUNTER — Telehealth: Payer: Self-pay | Admitting: Adult Health

## 2018-09-01 NOTE — Telephone Encounter (Signed)
Patient is requesting a refill of her trazodone and hydroxyzine, there was a third med she needed but could not provide name of med at this time. She also would like a call back to give an update on mental health status. If approved please send to [facility] on [address].

## 2018-09-01 NOTE — Telephone Encounter (Signed)
LVM for pt to call to discuss.  T. Nelson, CMA  

## 2018-09-02 NOTE — Telephone Encounter (Signed)
Advised pt that Orpha Bur gave her #30 of hydroxyzine, trazodone and paroxetine on 07/30/18 with 2 additional refills and that she should have enough medication/refills to last until 10/2018.  Also advised pt that Orpha Bur will not be refilling these medications for her because Orpha Bur wanted pt to see [facility] and have them manage medications.  Pt stated that she wasn't going back to [facility] and that she wanted a PCP to manage her medications.  Advised pt that Orpha Bur is not a psychiatrist, psychologist or therapist and that psychiatry needs to manage these medications.  Pt expressed understanding.  [name], CMA

## 2018-10-11 ENCOUNTER — Ambulatory Visit (INDEPENDENT_AMBULATORY_CARE_PROVIDER_SITE_OTHER): Payer: Self-pay | Admitting: Adult Health

## 2018-10-11 ENCOUNTER — Encounter: Payer: Self-pay | Admitting: Adult Health

## 2018-10-11 VITALS — BP 107/72 | HR 74 | Temp 98.5°F | Ht 65.0 in | Wt 192.3 lb

## 2018-10-11 DIAGNOSIS — R8290 Unspecified abnormal findings in urine: Secondary | ICD-10-CM | POA: Insufficient documentation

## 2018-10-11 DIAGNOSIS — R102 Pelvic and perineal pain: Secondary | ICD-10-CM

## 2018-10-11 LAB — POCT URINALYSIS DIPSTICK
Bilirubin, UA: NEGATIVE
Glucose, UA: NEGATIVE
KETONES UA: NEGATIVE
Nitrite, UA: NEGATIVE
Protein, UA: NEGATIVE
SPEC GRAV UA: 1.02 (ref 1.010–1.025)
UROBILINOGEN UA: 0.2 U/dL
pH, UA: 7 (ref 5.0–8.0)

## 2018-10-11 MED ORDER — NITROFURANTOIN MONOHYD MACRO 100 MG PO CAPS: 100.0000 mg | ORAL_CAPSULE | Freq: Two times a day (BID) | ORAL | 0 refills | Status: AC

## 2018-10-11 NOTE — Assessment & Plan Note (Signed)
UA Blood Mod Nit Neg Leu Small Started on Macrodantin 100mg  BID x 7 d Specimen sent for C/S Increase plain water intake, avoid soda intake

## 2018-10-11 NOTE — Progress Notes (Signed)
Subjective:    Patient ID: Stacie Sanders, female    DOB: 10/07/1971, 47 y.o.   MRN: [id number]  HPI:  Stacie Sanders presents with urinary frequency, constant lower abdominal pain (2/10, pressure), and strong malodorous urine >1 month. She primarily drinks soda pop- Dr. Penelope Coop, estimates 20 oz plain water/day She denies N/V/D/flank pain She has hx of nephrolithiasis  She has not had menstrual cycle >10 months  Patient Care Team    Relationship Specialty Notifications Start End  Julaine Fusi, NP PCP - General Family Medicine  02/15/18   Gastroenterology, [facility]    02/15/18   [facility]  Family Medicine  02/15/18   Department, [facility]    02/15/18     Patient Active Problem List   Diagnosis Date Noted  . Abnormal urinalysis 10/11/2018  . Bipolar disorder, unspecified (HCC) 07/30/2018  . Respiratory failure (HCC) 07/28/2018  . Cocaine use disorder, mild, abuse (HCC)   . 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 Bipolar disorder (HCC) 06/18/2018  . Suicide attempt (HCC)   . Fatigue 03/01/2018  . Irritability 03/01/2018  . GAD (generalized anxiety disorder) 03/01/2018  . Gingivitis 03/01/2018  . Grinding of teeth 03/01/2018  . Healthcare maintenance 02/15/2018  . Depression 02/15/2018  . Bipolar 1 disorder (HCC) 02/15/2018  . Cough in adult 02/15/2018  . Acute bacterial conjunctivitis of right eye 02/15/2018  . Acute respiratory failure (HCC) 01/03/2016  . Acute respiratory failure with hypoxemia (HCC)   . Acute encephalopathy   . Drug overdose 12/29/2015  . Overdose 12/29/2015     Past Medical History:  Diagnosis Date  . Allergic rhinitis   . Bipolar 1 disorder (HCC)   . Borderline diabetes mellitus   . Colon polyp   . Depression   . Dyspnea on exertion   . Esophageal stricture    scheduled for EGD and dilation 03-18-2016  . GAD (generalized anxiety disorder)   . History of acute respiratory failure     12-29-2015  due to polysubstance overdose--  pt intubated --  resolved   . History of febrile seizure    x1  01/ 2017--  none since  . History of kidney stones    staghorn  . History of suicide attempt    12-29-2015  -- overdose klonopin, xanax, alcohol--  acute respirtory failure and acute encenphalopathy -- both resolved  . IBS (irritable bowel syndrome)   . Irregular menstrual cycle   . Renal calculi    bilateral     Past Surgical History:  Procedure Laterality Date  . COLONOSCOPY    . CYSTOSCOPY WITH STENT PLACEMENT Bilateral 03/10/2016   Procedure: CYSTOSCOPY WITH STENT PLACEMENT;  Surgeon: Malen Gauze, MD;  Location: [facility];  Service: Urology;  Laterality: Bilateral;  . CYSTOSCOPY/RETROGRADE/URETEROSCOPY/STONE EXTRACTION WITH BASKET Bilateral 03/10/2016   Procedure: CYSTOSCOPY/RETROGRADE/URETEROSCOPY/STONE EXTRACTION WITH BASKET;  Surgeon: Malen Gauze, MD;  Location: [facility];  Service: Urology;  Laterality: Bilateral;  . EXTRACORPOREAL SHOCK WAVE LITHOTRIPSY  x2    . PERCUTANEOUS NEPHROSTOLITHOTOMY Right 09-18-2005   staghorn  . TUBAL LIGATION  1999     Family History  Problem Relation Age of Onset  . Cancer Father        pancreas  . Melanoma Father   . Cancer Maternal Grandmother        lung  . COPD Paternal Grandmother   . Melanoma Paternal Grandmother  Social History   Substance and Sexual Activity  Drug Use No     Social History   Substance and Sexual Activity  Alcohol Use Yes   Comment: occ     Social History   Tobacco Use  Smoking Status Current Every Day Smoker  . Packs/day: 0.50  . Years: 20.00  . Pack years: 10.00  . Types: Cigarettes  Smokeless Tobacco Never Used     Outpatient Encounter Medications as of 10/11/2018  Medication Sig  . hydrOXYzine (ATARAX/VISTARIL) 50 MG tablet Take 1 tablet (50 mg total) by mouth 3 (three) times daily as needed for anxiety.  Marland Kitchen ibuprofen  (ADVIL,MOTRIN) 600 MG tablet Take 1 tablet (600 mg total) by mouth every 8 (eight) hours as needed.  . meloxicam (MOBIC) 7.5 MG tablet Take 15 mg by mouth daily as needed for pain.  Marland Kitchen omeprazole (PRILOSEC) 40 MG capsule Take 1 capsule (40 mg total) by mouth daily.  Marland Kitchen PARoxetine (PAXIL) 10 MG tablet Take 1 tablet (10 mg total) by mouth daily.  . traZODone (DESYREL) 100 MG tablet Take 1 tablet (100 mg total) by mouth at bedtime as needed and may repeat dose one time if needed for sleep.  . nitrofurantoin, macrocrystal-monohydrate, (MACROBID) 100 MG capsule Take 1 capsule (100 mg total) by mouth 2 (two) times daily for 7 days.   No facility-administered encounter medications on file as of 10/11/2018.     Allergies: Fentanyl; Percocet [oxycodone-acetaminophen]; and Robaxin [methocarbamol]  Body mass index is 32 kg/m.  Blood pressure 107/72, pulse 74, temperature 98.5 F (36.9 C), temperature source Oral, height 5\' 5"  (1.651 m), weight 192 lb 4.8 oz (87.2 kg), SpO2 99 %.  Review of Systems  Constitutional: Positive for fatigue. Negative for activity change, appetite change, chills, diaphoresis, fever and unexpected weight change.  HENT: Negative for congestion.   Eyes: Negative for visual disturbance.  Respiratory: Negative for cough, chest tightness, shortness of breath, wheezing and stridor.   Cardiovascular: Negative for chest pain, palpitations and leg swelling.  Gastrointestinal: Positive for abdominal pain. Negative for abdominal distention, blood in stool, constipation, diarrhea, nausea and vomiting.  Endocrine: Negative for cold intolerance, heat intolerance, polydipsia, polyphagia and polyuria.  Genitourinary: Positive for frequency. Negative for difficulty urinating, dysuria, flank pain and hematuria.       Objective:   Physical Exam  Constitutional: She is oriented to person, place, and time. She appears well-developed and well-nourished.  HENT:  Head: Normocephalic and  atraumatic.  Right Ear: External ear normal.  Left Ear: External ear normal.  Nose: Nose normal.  Mouth/Throat: Oropharynx is clear and moist.  Eyes: Pupils are equal, round, and reactive to light. Conjunctivae and EOM are normal.  Cardiovascular: Normal rate, regular rhythm, normal heart sounds and intact distal pulses.  No murmur heard. Pulmonary/Chest: Effort normal and breath sounds normal. No stridor. No respiratory distress. She has no wheezes. She has no rales. She exhibits no tenderness.  Abdominal: Soft. Bowel sounds are normal. She exhibits no distension and no mass. There is tenderness in the right lower quadrant and left lower quadrant. There is no rigidity, no rebound, no guarding, no CVA tenderness, no tenderness at McBurney's point and negative Murphy's sign. No hernia.  Neurological: She is alert and oriented to person, place, and time.  Skin: Skin is warm and dry. Capillary refill takes less than 2 seconds. No rash noted. She is not diaphoretic. No erythema. No pallor.  Psychiatric: She has a normal mood and affect. Her behavior is  normal. Judgment and thought content normal.  Nursing note and vitals reviewed.     Assessment & Plan:   1. Pelvic pain in female   2. Malodorous urine   3. Abnormal urinalysis     Abnormal urinalysis UA Blood Mod Nit Neg Leu Small Started on Macrodantin 100mg  BID x 7 d Specimen sent for C/S Increase plain water intake, avoid soda intake    FOLLOW-UP:  Return if symptoms worsen or fail to improve.

## 2018-10-11 NOTE — Patient Instructions (Addendum)
Urinary Tract Infection, Adult A urinary tract infection (UTI) is an infection of any part of the urinary tract. The urinary tract includes the:  Kidneys.  Ureters.  Bladder.  Urethra.  These organs make, store, and get rid of pee (urine) in the body. Follow these instructions at home:  Take over-the-counter and prescription medicines only as told by your doctor.  If you were prescribed an antibiotic medicine, take it as told by your doctor. Do not stop taking the antibiotic even if you start to feel better.  Avoid the following drinks: ? Alcohol. ? Caffeine. ? Tea. ? Carbonated drinks.  Drink enough fluid to keep your pee clear or pale yellow.  Keep all follow-up visits as told by your doctor. This is important.  Make sure to: ? Empty your bladder often and completely. Do not to hold pee for long periods of time. ? Empty your bladder before and after sex. ? Wipe from front to back after a bowel movement if you are female. Use each tissue one time when you wipe. Contact a doctor if:  You have back pain.  You have a fever.  You feel sick to your stomach (nauseous).  You throw up (vomit).  Your symptoms do not get better after 3 days.  Your symptoms go away and then come back. Get help right away if:  You have very bad back pain.  You have very bad lower belly (abdominal) pain.  You are throwing up and cannot keep down any medicines or water. This information is not intended to replace advice given to you by your health care provider. Make sure you discuss any questions you have with your health care provider. Document Released: 06/02/2008 Document Revised: 05/22/2016 Document Reviewed: 11/05/2015 Elsevier Interactive Patient Education  2018 [employer].  Please take Macrodantin twice daily for 7 days. Increase water and avoid soft drinks. OTC Acetaminophen for discomfort. We will when urine culture/sensitivty results are available. Feel better! Please  keep your Psychiatry appt 28 Oct. Please keep regular follow-up here 7 Nov. NICE TO SEE YOU!

## 2018-10-13 LAB — CULTURE, URINE COMPREHENSIVE

## 2018-10-26 ENCOUNTER — Emergency Department (HOSPITAL_COMMUNITY): Payer: Self-pay

## 2018-10-26 ENCOUNTER — Emergency Department (HOSPITAL_COMMUNITY)
Admission: EM | Admit: 2018-10-26 | Discharge: 2018-10-26 | Disposition: A | Discharge status: Home / Self Care | Payer: Self-pay | Attending: Emergency Medicine | Admitting: Emergency Medicine

## 2018-10-26 ENCOUNTER — Encounter (HOSPITAL_COMMUNITY): Payer: Self-pay | Admitting: *Deleted

## 2018-10-26 DIAGNOSIS — F17210 Nicotine dependence, cigarettes, uncomplicated: Secondary | ICD-10-CM | POA: Insufficient documentation

## 2018-10-26 DIAGNOSIS — E119 Type 2 diabetes mellitus without complications: Secondary | ICD-10-CM | POA: Insufficient documentation

## 2018-10-26 DIAGNOSIS — B9689 Other specified bacterial agents as the cause of diseases classified elsewhere: Secondary | ICD-10-CM

## 2018-10-26 DIAGNOSIS — N760 Acute vaginitis: Secondary | ICD-10-CM | POA: Insufficient documentation

## 2018-10-26 DIAGNOSIS — Z79899 Other long term (current) drug therapy: Secondary | ICD-10-CM | POA: Insufficient documentation

## 2018-10-26 LAB — COMPREHENSIVE METABOLIC PANEL
ALK PHOS: 92 U/L (ref 38–126)
ALT: 19 U/L (ref 0–44)
AST: 20 U/L (ref 15–41)
Albumin: 4.1 g/dL (ref 3.5–5.0)
Anion gap: 10 (ref 5–15)
BUN: 10 mg/dL (ref 6–20)
CALCIUM: 10.4 mg/dL — AB (ref 8.9–10.3)
CO2: 26 mmol/L (ref 22–32)
CREATININE: 0.96 mg/dL (ref 0.44–1.00)
Chloride: 102 mmol/L (ref 98–111)
GFR calc non Af Amer: >60 mL/min (ref >60)
GLUCOSE: 110 mg/dL — AB (ref 70–99)
Potassium: 3.6 mmol/L (ref 3.5–5.1)
SODIUM: 138 mmol/L (ref 135–145)
Total Bilirubin: 0.9 mg/dL (ref 0.3–1.2)
Total Protein: 6.8 g/dL (ref 6.5–8.1)

## 2018-10-26 LAB — CBC
HCT: 44.4 % (ref 36.0–46.0)
HEMOGLOBIN: 13.7 g/dL (ref 12.0–15.0)
MCH: 28.8 pg (ref 26.0–34.0)
MCHC: 30.9 g/dL (ref 30.0–36.0)
MCV: 93.3 fL (ref 80.0–100.0)
Platelets: 324 10*3/uL (ref 150–400)
RBC: 4.76 MIL/uL (ref 3.87–5.11)
RDW: 13.7 % (ref 11.5–15.5)
WBC: 15.3 10*3/uL — ABNORMAL HIGH (ref 4.0–10.5)
nRBC: 0 % (ref 0.0–0.2)

## 2018-10-26 LAB — WET PREP, GENITAL
SPERM: NONE SEEN
TRICH WET PREP: NONE SEEN
Yeast Wet Prep HPF POC: NONE SEEN

## 2018-10-26 LAB — I-STAT BETA HCG BLOOD, ED (MC, WL, AP ONLY): I-stat hCG, quantitative: <5 m[IU]/mL (ref <5)

## 2018-10-26 LAB — I-STAT TROPONIN, ED: Troponin i, poc: 0 ng/mL (ref 0.00–0.08)

## 2018-10-26 LAB — URINALYSIS, ROUTINE W REFLEX MICROSCOPIC
Bilirubin Urine: NEGATIVE
GLUCOSE, UA: NEGATIVE mg/dL
HGB URINE DIPSTICK: NEGATIVE
Ketones, ur: NEGATIVE mg/dL
LEUKOCYTES UA: NEGATIVE
Nitrite: NEGATIVE
Protein, ur: NEGATIVE mg/dL
SPECIFIC GRAVITY, URINE: 1.012 (ref 1.005–1.030)
pH: 7 (ref 5.0–8.0)

## 2018-10-26 LAB — RAPID URINE DRUG SCREEN, HOSP PERFORMED
AMPHETAMINES: NOT DETECTED
BENZODIAZEPINES: NOT DETECTED
Barbiturates: NOT DETECTED
Cocaine: NOT DETECTED
OPIATES: NOT DETECTED
Tetrahydrocannabinol: NOT DETECTED

## 2018-10-26 LAB — LIPASE, BLOOD: Lipase: 28 U/L (ref 11–51)

## 2018-10-26 LAB — ETHANOL

## 2018-10-26 MED ORDER — MECLIZINE HCL 25 MG PO TABS
25.0000 mg | ORAL_TABLET | Freq: Once | ORAL | Status: AC
  Administered 2018-10-26: 25 mg via ORAL
  Filled 2018-10-26: qty 1

## 2018-10-26 MED ORDER — KETOROLAC TROMETHAMINE 30 MG/ML IJ SOLN
30.0000 mg | Freq: Once | INTRAMUSCULAR | Status: AC
  Administered 2018-10-26: 30 mg via INTRAVENOUS
  Filled 2018-10-26: qty 1

## 2018-10-26 MED ORDER — ACETAMINOPHEN 500 MG PO TABS
1000.0000 mg | ORAL_TABLET | Freq: Once | ORAL | Status: AC
  Administered 2018-10-26: 1000 mg via ORAL
  Filled 2018-10-26: qty 2

## 2018-10-26 MED ORDER — ONDANSETRON 4 MG PO TBDP: 4.0000 mg | ORAL_TABLET | Freq: Three times a day (TID) | ORAL | 0 refills | Status: DC | PRN

## 2018-10-26 MED ORDER — METRONIDAZOLE 500 MG PO TABS: 500.0000 mg | ORAL_TABLET | Freq: Two times a day (BID) | ORAL | 0 refills | Status: DC

## 2018-10-26 NOTE — ED Provider Notes (Signed)
[facility] EMERGENCY DEPARTMENT Provider Note   CSN: [id number] Arrival date & time: 10/26/18  4098     History   Chief Complaint No chief complaint on file.   HPI Stacie Sanders is a 47 y.o. female with a history of suicide attempts, bipolar 1 disorder, esophageal stricture, GAD, nephrolithiasis who presents to the emergency department with multiple complaints.  Reports she was seen by her PCP on 10/11/2018 and was given Macrobid for a UTI.  She reports initial improvement of bilateral lower abdominal pain, but reports it has returned over the last few days.  She also endorses pain to her bilateral flanks and lower legs.  She states that she did have unprotected sex approximately 8 days ago and thinks that she may be pregnant despite having a tubal ligation several years ago as she has also been nauseated.  He also reports that she feels as if the UTI she was treated for 2 weeks ago has returned, but she denies any frequency or hesitancy or dysuria.  Denies vaginal discharge, itching, and pain.  States that she has a history of kidney stones and has required multiple procedures in the past.  She also endorses "achy " pain to her left chest that radiates to her back between her shoulder blades.  She states that it feels like someone is "beating her and she feels bruised."  She reports mild, intermittent associated dyspnea for the last few days.  No cough.  She also reports an intermittent headache over the last few days with intermittent dizziness, described as room spinning.  States that she also had an episode several days ago where she had diplopia. States "I was seeing 2 people."  Reports that it has resolved but she is also been having intermittent blurred vision.  She denies eye redness, pain, or itching, sinus pain or pressure, sore throat, neck pain or stiffness  She was most recently admitted from June 17-21 to 2019 for a suicide attempt secondary to drug  overdose on BuSpar, Neurontin, Paxil, Seroquel, Vistaril with EtOH on board.  Reports her last alcohol use was yesterday.  She denies any other IV or recreational drugs and states that she is currently clean.  The history is provided by the patient. No language interpreter was used.    Past Medical History:  Diagnosis Date  . Allergic rhinitis   . Bipolar 1 disorder (HCC)   . Borderline diabetes mellitus   . Colon polyp   . Depression   . Dyspnea on exertion   . Esophageal stricture    scheduled for EGD and dilation 03-18-2016  . GAD (generalized anxiety disorder)   . History of acute respiratory failure    12-29-2015  due to polysubstance overdose--  pt intubated --  resolved   . History of febrile seizure    x1  01/ 2017--  none since  . History of kidney stones    staghorn  . History of suicide attempt    12-29-2015  -- overdose klonopin, xanax, alcohol--  acute respirtory failure and acute encenphalopathy -- both resolved  . IBS (irritable bowel syndrome)   . Irregular menstrual cycle   . Renal calculi    bilateral    Patient Active Problem List   Diagnosis Date Noted  . Abnormal urinalysis 10/11/2018  . Bipolar disorder, unspecified (HCC) 07/30/2018  . Respiratory failure (HCC) 07/28/2018  . Cocaine use disorder, mild, abuse (HCC)   . 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  Bipolar disorder (HCC) 06/18/2018  . Suicide attempt (HCC)   . Fatigue 03/01/2018  . Irritability 03/01/2018  . GAD (generalized anxiety disorder) 03/01/2018  . Gingivitis 03/01/2018  . Grinding of teeth 03/01/2018  . Healthcare maintenance 02/15/2018  . Depression 02/15/2018  . Bipolar 1 disorder (HCC) 02/15/2018  . Cough in adult 02/15/2018  . Acute bacterial conjunctivitis of right eye 02/15/2018  . Acute respiratory failure (HCC) 01/03/2016  . Acute respiratory failure with hypoxemia (HCC)   . Acute encephalopathy   . Drug overdose 12/29/2015  . Overdose  12/29/2015    Past Surgical History:  Procedure Laterality Date  . COLONOSCOPY    . CYSTOSCOPY WITH STENT PLACEMENT Bilateral 03/10/2016   Procedure: CYSTOSCOPY WITH STENT PLACEMENT;  Surgeon: Malen Gauze, MD;  Location: [facility];  Service: Urology;  Laterality: Bilateral;  . CYSTOSCOPY/RETROGRADE/URETEROSCOPY/STONE EXTRACTION WITH BASKET Bilateral 03/10/2016   Procedure: CYSTOSCOPY/RETROGRADE/URETEROSCOPY/STONE EXTRACTION WITH BASKET;  Surgeon: Malen Gauze, MD;  Location: [facility];  Service: Urology;  Laterality: Bilateral;  . EXTRACORPOREAL SHOCK WAVE LITHOTRIPSY  x2    . PERCUTANEOUS NEPHROSTOLITHOTOMY Right 09-18-2005   staghorn  . TUBAL LIGATION  1999     OB History    Gravida  5   Para      Term      Preterm      AB  2   Living  3     SAB  1   TAB  1   Ectopic      Multiple      Live Births               Home Medications    Prior to Admission medications   Medication Sig Start Date End Date Taking? Authorizing Provider  hydrOXYzine (ATARAX/VISTARIL) 50 MG tablet Take 1 tablet (50 mg total) by mouth 3 (three) times daily as needed for anxiety. 08/04/18   Danford, Orpha Bur D, NP  ibuprofen (ADVIL,MOTRIN) 600 MG tablet Take 1 tablet (600 mg total) by mouth every 8 (eight) hours as needed. 08/04/18   Danford, Orpha Bur D, NP  meloxicam (MOBIC) 7.5 MG tablet Take 15 mg by mouth daily as needed for pain.    [provider]  metroNIDAZOLE (FLAGYL) 500 MG tablet Take 1 tablet (500 mg total) by mouth 2 (two) times daily. 10/26/18   Infant Zink A, PA-C  omeprazole (PRILOSEC) 40 MG capsule Take 1 capsule (40 mg total) by mouth daily. 08/04/18   Danford, Orpha Bur D, NP  ondansetron (ZOFRAN ODT) 4 MG disintegrating tablet Take 1 tablet (4 mg total) by mouth every 8 (eight) hours as needed for nausea or vomiting. 10/26/18   Marylou Wages A, PA-C  PARoxetine (PAXIL) 10 MG tablet Take 1 tablet (10 mg total) by mouth daily. 08/04/18    Danford, Orpha Bur D, NP  traZODone (DESYREL) 100 MG tablet Take 1 tablet (100 mg total) by mouth at bedtime as needed and may repeat dose one time if needed for sleep. 08/04/18   Julaine Fusi, NP    Family History Family History  Problem Relation Age of Onset  . Cancer Father        pancreas  . Melanoma Father   . Cancer Maternal Grandmother        lung  . COPD Paternal Grandmother   . Melanoma Paternal Grandmother     Social History Social History   Tobacco Use  . Smoking status: Current Every Day Smoker    Packs/day: 0.50  Years: 20.00    Pack years: 10.00    Types: Cigarettes  . Smokeless tobacco: Never Used  Substance Use Topics  . Alcohol use: Yes    Comment: occ  . Drug use: No     Allergies   Fentanyl; Percocet [oxycodone-acetaminophen]; and Robaxin [methocarbamol]   Review of Systems Review of Systems  Constitutional: Negative for activity change, chills and fever.  HENT: Negative for congestion and sore throat.   Eyes: Positive for visual disturbance.  Respiratory: Negative for cough and shortness of breath.   Cardiovascular: Positive for chest pain. Negative for palpitations and leg swelling.  Gastrointestinal: Positive for abdominal pain. Negative for constipation, diarrhea, nausea and vomiting.  Genitourinary: Negative for dysuria, flank pain, urgency and vaginal discharge.  Musculoskeletal: Positive for back pain and myalgias.  Skin: Negative for rash.  Allergic/Immunologic: Negative for immunocompromised state.  Neurological: Positive for dizziness and headaches.  Psychiatric/Behavioral: Negative for confusion.   Physical Exam Updated Vital Signs BP 99/60   Pulse 90   Temp 100 F (37.8 C) (Rectal)   Resp 18   SpO2 99%   Physical Exam  Constitutional: She appears well-developed. No distress.  Tearful.  HENT:  Head: Normocephalic.  Eyes: Conjunctivae are normal.  Neck: Normal range of motion. Neck supple. No JVD present. No tracheal  deviation present. No thyromegaly present.  Cardiovascular: Normal rate, regular rhythm, normal heart sounds and intact distal pulses. Exam reveals no gallop and no friction rub.  No murmur heard. Pulmonary/Chest: Effort normal and breath sounds normal. No stridor. No respiratory distress. She has no wheezes. She has no rales. She exhibits no tenderness.  Lungs are clear to auscultation bilaterally.  No tachypnea noted.  Heart is regular rate and rhythm without murmurs rubs or gallops.  No focal tenderness to palpation to the bilateral chest wall.  Abdominal: Soft. Bowel sounds are normal. She exhibits no distension and no mass. There is tenderness. There is no rebound and no guarding. No hernia.  Mild tenderness to palpation in the bilateral lower quadrants.  No rebound or guarding.  Upper abdomen is unremarkable.  No CVA tenderness bilaterally.  Musculoskeletal: She exhibits tenderness. She exhibits no edema or deformity.  Diffusely tender to palpation to the back, neck, and bilateral lower extremities even with minimal palpation.  No overlying ecchymosis, erythema, edema, warmth, rashes.  Moves all 4 extremities.  Ambulatory without difficulty.  Full active and passive range of motion of all joints of the bilateral upper and lower extremities.  She is otherwise neurovascularly intact.  Lymphadenopathy:    She has no cervical adenopathy.  Neurological: She is alert.  Skin: Skin is warm. No rash noted. She is not diaphoretic.  Psychiatric: Her behavior is normal.  Nursing note and vitals reviewed.   ED Treatments / Results  Labs (all labs ordered are listed, but only abnormal results are displayed) Labs Reviewed  WET PREP, GENITAL - Abnormal; Notable for the following components:      Result Value   Clue Cells Wet Prep HPF POC PRESENT (*)    WBC, Wet Prep HPF POC MODERATE (*)    All other components within normal limits  URINALYSIS, ROUTINE W REFLEX MICROSCOPIC - Abnormal; Notable for the  following components:   Color, Urine STRAW (*)    All other components within normal limits  COMPREHENSIVE METABOLIC PANEL - Abnormal; Notable for the following components:   Glucose, Bld 110 (*)    Calcium 10.4 (*)    All other components within  normal limits  CBC - Abnormal; Notable for the following components:   WBC 15.3 (*)    All other components within normal limits  CULTURE, BLOOD (ROUTINE X 2)  CULTURE, BLOOD (ROUTINE X 2)  LIPASE, BLOOD  ETHANOL  RAPID URINE DRUG SCREEN, HOSP PERFORMED  I-STAT BETA HCG BLOOD, ED ([facility], [facility], [facility] ONLY)  I-STAT TROPONIN, ED  GC/CHLAMYDIA PROBE AMP ([place]) NOT AT [facility]    EKG None  Radiology Dg Chest 2 View  Result Date: 10/26/2018 CLINICAL DATA:  Chest pain and shortness of breath EXAM: CHEST - 2 VIEW COMPARISON:  July 29, 2018 FINDINGS: Lungs are clear. Heart size and pulmonary vascularity are normal. No adenopathy. No pneumothorax. No bone lesions. IMPRESSION: No edema or consolidation. Electronically Signed   By: Bretta Bang III M.D.   On: 10/26/2018 09:21    Procedures Procedures (including critical care time)  Medications Ordered in ED Medications  acetaminophen (TYLENOL) tablet 1,000 mg (1,000 mg Oral Given 10/26/18 0956)  ketorolac (TORADOL) 30 MG/ML injection 30 mg (30 mg Intravenous Given 10/26/18 1153)  meclizine (ANTIVERT) tablet 25 mg (25 mg Oral Given 10/26/18 1152)     Initial Impression / Assessment and Plan / ED Course  I have reviewed the triage vital signs and the nursing notes.  Pertinent labs & imaging results that were available during my care of the patient were reviewed by me and considered in my medical decision making (see chart for details).     47 year old female with a history of suicide attempt, bipolar 1 disorder, esophageal stricture, GAD, nephrolithiasis presenting with multiple complaints.  She initially states that she is having chest pain with dyspnea and back pain, but then later  states that her flanks are hurting and thinks she has a kidney stone, she also thinks that she may be pregnant, and is concerned that her UTI for which she has recently finished an antibiotic may have not resolved.  She is tearful on exam, but otherwise mildly disheveled and otherwise well-appearing.  She does not appear toxic or ill.  EKG with normal sinus rhythm.  Chest x-ray is unremarkable.  Troponin is negative.  Given the length of time symptoms have been ongoing, delta troponin is not indicated. PERC negative.  Low suspicion for ACS, PE, esophageal rupture, PNA, pericarditis, myocarditis.  Labs are notable for leukocytosis of 15. UA is unremarkable.  Pregnancy test is negative.  She does not have a surgical abdomen at this time doubt appendicitis, cholecystitis, ovarian torsion, diverticulitis.  She does report that she had sex approximately 8 days ago and did not use condoms.  For completeness, will perform pelvic exam given continued complaints of lower abdominal and pelvic pain.  GC chlamydia is pending.  Based on exam, low suspicion for positive.  She did have clue cells will discharge her with metronidazole and gave the patient detailed instructions on avoiding alcohol while taking this medication.  She does express concerns that she has obstructive uropathy; however, given urinalysis without hematuria or hemoglobinuria, and history of present illnesses, I feel that this is unlikely with her presentation.  Of note, on chart review, after reviewing UAs prior to previous urological procedures, patient has almost always had hemoglobinuria.  Although it is possible this could be positive, I have a low suspicion this is the etiology of her symptoms today.   I advised her to follow-up with her primary care provider if symptoms do not improve in the next few days.  She was also given  strict return precautions to the emergency department.  She is hemodynamically stable and in no acute distress.  She is  safe for discharge home with outpatient follow-up at this time.  Final Clinical Impressions(s) / ED Diagnoses   Final diagnoses:  Bacterial vaginosis    ED Discharge Orders         Ordered    metroNIDAZOLE (FLAGYL) 500 MG tablet  2 times daily     10/26/18 1212    ondansetron (ZOFRAN ODT) 4 MG disintegrating tablet  Every 8 hours PRN     10/26/18 1212           Keishawn Rajewski A, PA-C 10/26/18 Spero Geralds, MD 10/27/18 [phone]

## 2018-10-26 NOTE — Discharge Instructions (Signed)
Thank you for allowing me to care for you today in the Emergency Department.   Your exam today was consistent with bacterial vaginosis.  To treat this infection, take 1 tablet of metronidazole by mouth 2 times daily for the next week.  It is important that you avoid alcohol while you are taking this medication because it can cause projectile vomiting.  For nausea, you can let 1 tablet of Zofran dissolve in your tongue every 8 hours as needed.  Take 600 mg of ibuprofen with food or 650 mg of Tylenol every 6 hours for body aches, pain, or headache.  If your symptoms do not start to improve in the next 2 to 3 days, please follow-up with your primary care provider.   Return to the emergency department if you develop a high fever, blood in your urine, if you stop being able to pee, severe constant shortness of breath with chest pain, or other new, concerning symptoms.

## 2018-10-26 NOTE — ED Triage Notes (Signed)
Pt in c/o bil lower back pain that goes to generalized body, pt reports hx of UTI diagnosis and pt reports completing unknown antibiotic, pain has continued since then per pt, pt denies dysuria, pt reports hematuria, denies n/v/d, A&O x4

## 2018-10-27 LAB — GC/CHLAMYDIA PROBE AMP (~~LOC~~) NOT AT ARMC
Chlamydia: NEGATIVE
NEISSERIA GONORRHEA: NEGATIVE

## 2018-10-28 ENCOUNTER — Ambulatory Visit (INDEPENDENT_AMBULATORY_CARE_PROVIDER_SITE_OTHER): Payer: Self-pay | Admitting: Adult Health

## 2018-10-28 ENCOUNTER — Encounter: Payer: Self-pay | Admitting: Adult Health

## 2018-10-28 VITALS — BP 114/80 | HR 80 | Temp 98.2°F | Ht 65.0 in | Wt 186.0 lb

## 2018-10-28 DIAGNOSIS — R059 Cough, unspecified: Secondary | ICD-10-CM

## 2018-10-28 DIAGNOSIS — R05 Cough: Secondary | ICD-10-CM

## 2018-10-28 DIAGNOSIS — M791 Myalgia, unspecified site: Secondary | ICD-10-CM | POA: Insufficient documentation

## 2018-10-28 DIAGNOSIS — R509 Fever, unspecified: Secondary | ICD-10-CM

## 2018-10-28 DIAGNOSIS — H538 Other visual disturbances: Secondary | ICD-10-CM

## 2018-10-28 LAB — POCT INFLUENZA A/B
Influenza A, POC: NEGATIVE
Influenza B, POC: NEGATIVE

## 2018-10-28 MED ORDER — OSELTAMIVIR PHOSPHATE 75 MG PO CAPS: 75.0000 mg | ORAL_CAPSULE | Freq: Two times a day (BID) | ORAL | 0 refills | Status: DC

## 2018-10-28 NOTE — Assessment & Plan Note (Signed)
She also reports blurred vision the last two days, Snellen Eye Exam 20/40  She has corrective lenses, however has not worn them in year, has not had vision screening in as long. Follow-up with optometrist for blurred vision.

## 2018-10-28 NOTE — Assessment & Plan Note (Signed)
Despite Flu Test being negative, your symptoms are very consistent with influenza. Please take Tamiflu as directed. Increase fluids and follow BRAT diet. Please alternate OTC Acetaminophen and Ibuprofen as needed for fever/discomfort. Work Excuse provided, okay to return Monday Nov 01, 2018. Stop tobacco use. Please call clinic with any questions/concerns.

## 2018-10-28 NOTE — Patient Instructions (Addendum)
Influenza, Adult Influenza ("the flu") is an infection in the lungs, nose, and throat (respiratory tract). It is caused by a virus. The flu causes many common cold symptoms, as well as a high fever and body aches. It can make you feel very sick. The flu spreads easily from person to person (is contagious). Getting a flu shot (influenza vaccination) every year is the best way to prevent the flu. Follow these instructions at home:  Take over-the-counter and prescription medicines only as told by your doctor.  Use a cool mist humidifier to add moisture (humidity) to the air in your home. This can make it easier to breathe.  Rest as needed.  Drink enough fluid to keep your pee (urine) clear or pale yellow.  Cover your mouth and nose when you cough or sneeze.  Wash your hands with soap and water often, especially after you cough or sneeze. If you cannot use soap and water, use hand sanitizer.  Stay home from work or school as told by your doctor. Unless you are visiting your doctor, try to avoid leaving home until your fever has been gone for 24 hours without the use of medicine.  Keep all follow-up visits as told by your doctor. This is important. How is this prevented?  Getting a yearly (annual) flu shot is the best way to avoid getting the flu. You may get the flu shot in late summer, fall, or winter. Ask your doctor when you should get your flu shot.  Wash your hands often or use hand sanitizer often.  Avoid contact with people who are sick during cold and flu season.  Eat healthy foods.  Drink plenty of fluids.  Get enough sleep.  Exercise regularly. Contact a doctor if:  You get new symptoms.  You have: ? Chest pain. ? Watery poop (diarrhea). ? A fever.  Your cough gets worse.  You start to have more mucus.  You feel sick to your stomach (nauseous).  You throw up (vomit). Get help right away if:  You start to be short of breath or have trouble breathing.  Your  skin or nails turn a bluish color.  You have very bad pain or stiffness in your neck.  You get a sudden headache.  You get sudden pain in your face or ear.  You cannot stop throwing up. This information is not intended to replace advice given to you by your health care provider. Make sure you discuss any questions you have with your health care provider. Document Released: 09/23/2008 Document Revised: 05/22/2016 Document Reviewed: 10/09/2015 Elsevier Interactive Patient Education  2017 [employer] Inc.    [place] Diet A bland diet consists of foods that do not have a lot of fat or fiber. Foods without fat or fiber are easier for the body to digest. They are also less likely to irritate your mouth, throat, stomach, and other parts of your gastrointestinal tract. A bland diet is sometimes called a BRAT diet. What is my plan? Your health care provider or dietitian may recommend specific changes to your diet to prevent and treat your symptoms, such as:  Eating small meals often.  Cooking food until it is soft enough to chew easily.  Chewing your food well.  Drinking fluids slowly.  Not eating foods that are very spicy, sour, or fatty.  Not eating citrus fruits, such as oranges and grapefruit.  What do I need to know about this diet?  Eat a variety of foods from the bland diet food  list.  Do not follow a bland diet longer than you have to.  Ask your health care provider whether you should take vitamins. What foods can I eat? Grains  Hot cereals, such as cream of wheat. Bread, crackers, or tortillas made from refined white flour. Rice. Vegetables Canned or cooked vegetables. Mashed or boiled potatoes. Fruits Bananas. Applesauce. Other types of cooked or canned fruit with the skin and seeds removed, such as canned peaches or pears. Meats and Other Protein Sources Scrambled eggs. Creamy peanut butter or other nut butters. Lean, well-cooked meats, such as chicken or fish. Tofu.  Soups or broths. Dairy Low-fat dairy products, such as milk, cottage cheese, or yogurt. Beverages Water. Herbal tea. Apple juice. Sweets and Desserts Pudding. Custard. Fruit gelatin. Ice cream. Fats and Oils Mild salad dressings. Canola or olive oil. The items listed above may not be a complete list of allowed foods or beverages. Contact your dietitian for more options. What foods are not recommended? Foods and ingredients that are often not recommended include:  Spicy foods, such as hot sauce or salsa.  Fried foods.  Sour foods, such as pickled or fermented foods.  Raw vegetables or fruits, especially citrus or berries.  Caffeinated drinks.  Alcohol.  Strongly flavored seasonings or condiments.  The items listed above may not be a complete list of foods and beverages that are not allowed. Contact your dietitian for more information. This information is not intended to replace advice given to you by your health care provider. Make sure you discuss any questions you have with your health care provider. Document Released: 04/07/2016 Document Revised: 05/22/2016 Document Reviewed: 12/27/2014 Elsevier Interactive Patient Education  2018 [employer].  Despite Flu Test being negative, your symptoms are very consistent with influenza. Please take Tamiflu as directed. Increase fluids and follow BRAT diet. Please alternate OTC Acetaminophen and Ibuprofen as needed for fever/discomfort. Work Excuse provided, okay to return Monday Nov 01, 2018. Stop tobacco use. Follow-up with optometrist for blurred vision. Please call clinic with any questions/concerns. FEEL BETTER!

## 2018-10-28 NOTE — Addendum Note (Signed)
Addended by: Stan Head on: 10/28/2018 01:01 PM   Modules accepted: Orders

## 2018-10-28 NOTE — Progress Notes (Signed)
Subjective:    Patient ID: Stacie Sanders, female    DOB: 03/22/1971, 47 y.o.   MRN: [id number]  HPI:  Ms. 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 She denies CP/palpitations, but does report dyspnea with cough. She denies abdominal pain or diarrhea. She denies being around anyone with known flu. She has been taking OTC Bayer, last dose 0200 She also reports blurred vision the last two days, Snellen Eye Exam 20/40  She has corrective lenses, however has not worn them in year, has not had vision screening in as long.  Of Note-she completed course of Nitrofurantoin for UTI on 10/17/18  Patient Care Team    Relationship Specialty Notifications Start End  William Hamburger D, NP PCP - General Family Medicine  02/15/18   Gastroenterology, [facility]    02/15/18   [facility]  Family Medicine  02/15/18   Department, [facility]    02/15/18     Patient Active Problem List   Diagnosis Date Noted  . Myalgia 10/28/2018  . Blurred vision, bilateral 10/28/2018  . Abnormal urinalysis 10/11/2018  . Bipolar disorder, unspecified (HCC) 07/30/2018  . Respiratory failure (HCC) 07/28/2018  . Cocaine use disorder, mild, abuse (HCC)   . 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 Bipolar disorder (HCC) 06/18/2018  . Suicide  attempt (HCC)   . Fatigue 03/01/2018  . Irritability 03/01/2018  . GAD (generalized anxiety disorder) 03/01/2018  . Gingivitis 03/01/2018  . Grinding of teeth 03/01/2018  . Healthcare maintenance 02/15/2018  . Depression 02/15/2018  . Bipolar 1 disorder (HCC) 02/15/2018  . Cough in adult 02/15/2018  . Acute bacterial conjunctivitis of right eye 02/15/2018  . Acute respiratory failure (HCC) 01/03/2016  . Acute respiratory failure with hypoxemia (HCC)   . Acute encephalopathy   . Drug overdose 12/29/2015  . Overdose 12/29/2015     Past Medical History:  Diagnosis Date  . Allergic rhinitis   . Bipolar 1 disorder (HCC)   . Borderline diabetes mellitus   . Colon polyp   . Depression   . Dyspnea on exertion   . Esophageal stricture    scheduled for EGD and dilation 03-18-2016  . GAD (generalized anxiety disorder)   . History of acute respiratory failure    12-29-2015  due to polysubstance overdose--  pt intubated --  resolved   . History of febrile seizure    x1  01/ 2017--  none since  . History of kidney stones    staghorn  . History of suicide attempt    12-29-2015  -- overdose klonopin, xanax, alcohol--  acute respirtory failure and acute encenphalopathy -- both resolved  . IBS (irritable bowel syndrome)   . Irregular menstrual cycle   . Renal calculi    bilateral     Past Surgical History:  Procedure Laterality Date  . COLONOSCOPY    .  CYSTOSCOPY WITH STENT PLACEMENT Bilateral 03/10/2016   Procedure: CYSTOSCOPY WITH STENT PLACEMENT;  Surgeon: Malen Gauze, MD;  Location: [facility];  Service: Urology;  Laterality: Bilateral;  . CYSTOSCOPY/RETROGRADE/URETEROSCOPY/STONE EXTRACTION WITH BASKET Bilateral 03/10/2016   Procedure: CYSTOSCOPY/RETROGRADE/URETEROSCOPY/STONE EXTRACTION WITH BASKET;  Surgeon: Malen Gauze, MD;  Location: [facility];  Service: Urology;  Laterality: Bilateral;  . EXTRACORPOREAL SHOCK WAVE LITHOTRIPSY  x2     . PERCUTANEOUS NEPHROSTOLITHOTOMY Right 09-18-2005   staghorn  . TUBAL LIGATION  1999     Family History  Problem Relation Age of Onset  . Cancer Father        pancreas  . Melanoma Father   . Cancer Maternal Grandmother        lung  . COPD Paternal Grandmother   . Melanoma Paternal Grandmother      Social History   Substance and Sexual Activity  Drug Use No     Social History   Substance and Sexual Activity  Alcohol Use Yes   Comment: occ     Social History   Tobacco Use  Smoking Status Current Every Day Smoker  . Packs/day: 0.50  . Years: 20.00  . Pack years: 10.00  . Types: Cigarettes  Smokeless Tobacco Never Used     Outpatient Encounter Medications as of 10/28/2018  Medication Sig  . hydrOXYzine (ATARAX/VISTARIL) 50 MG tablet Take 1 tablet (50 mg total) by mouth 3 (three) times daily as needed for anxiety.  Marland Kitchen ibuprofen (ADVIL,MOTRIN) 600 MG tablet Take 1 tablet (600 mg total) by mouth every 8 (eight) hours as needed.  . meloxicam (MOBIC) 7.5 MG tablet Take 15 mg by mouth daily as needed for pain.  . metroNIDAZOLE (FLAGYL) 500 MG tablet Take 1 tablet (500 mg total) by mouth 2 (two) times daily.  Marland Kitchen omeprazole (PRILOSEC) 40 MG capsule Take 1 capsule (40 mg total) by mouth daily.  . ondansetron (ZOFRAN ODT) 4 MG disintegrating tablet Take 1 tablet (4 mg total) by mouth every 8 (eight) hours as needed for nausea or vomiting.  Marland Kitchen PARoxetine (PAXIL) 10 MG tablet Take 1 tablet (10 mg total) by mouth daily.  . traZODone (DESYREL) 100 MG tablet Take 1 tablet (100 mg total) by mouth at bedtime as needed and may repeat dose one time if needed for sleep.  Marland Kitchen oseltamivir (TAMIFLU) 75 MG capsule Take 1 capsule (75 mg total) by mouth 2 (two) times daily.   No facility-administered encounter medications on file as of 10/28/2018.     Allergies: Fentanyl; Percocet [oxycodone-acetaminophen]; and Robaxin [methocarbamol]  Body mass index is 30.95 kg/m.  Blood pressure  114/80, pulse 80, temperature 98.2 F (36.8 C), temperature source Oral, height 5\' 5"  (1.651 m), weight 186 lb (84.4 kg), SpO2 98 %.  Review of Systems  Constitutional: Positive for activity change, appetite change, chills, diaphoresis, fatigue, fever and unexpected weight change.  HENT: Positive for congestion, facial swelling, postnasal drip, rhinorrhea, sinus pressure, sinus pain, sore throat and voice change.   Eyes: Negative for visual disturbance.  Respiratory: Positive for cough. Negative for chest tightness, shortness of breath, wheezing and stridor.   Cardiovascular: Negative for chest pain, palpitations and leg swelling.  Gastrointestinal: Positive for nausea and vomiting. Negative for abdominal distention, abdominal pain, blood in stool, constipation and diarrhea.  Genitourinary: Negative for difficulty urinating, dysuria and flank pain.  Musculoskeletal: Positive for arthralgias and myalgias.  Neurological: Positive for headaches. Negative for dizziness and weakness.  Psychiatric/Behavioral: Positive for sleep disturbance.  Negative for dysphoric mood.       Objective:   Physical Exam  Constitutional: She is oriented to person, place, and time. She appears well-developed. She appears toxic. She has a sickly appearance. She appears ill.  HENT:  Head: Normocephalic and atraumatic.  Right Ear: External ear normal. Tympanic membrane is bulging. Tympanic membrane is not erythematous. No decreased hearing is noted.  Left Ear: External ear normal. Tympanic membrane is bulging. Tympanic membrane is not erythematous. No decreased hearing is noted.  Nose: Mucosal edema and rhinorrhea present. Right sinus exhibits maxillary sinus tenderness. Right sinus exhibits no frontal sinus tenderness. Left sinus exhibits maxillary sinus tenderness. Left sinus exhibits no frontal sinus tenderness.  Mouth/Throat: Oropharynx is clear and moist. Mucous membranes are dry. Abnormal dentition. Tonsils are 0  on the right. Tonsils are 0 on the left. No tonsillar exudate.  Eyes: Pupils are equal, round, and reactive to light. Conjunctivae and EOM are normal.  Neck: Normal range of motion. Neck supple.  Cardiovascular: Normal rate, regular rhythm, normal heart sounds and intact distal pulses.  No murmur heard. Pulmonary/Chest: Effort normal and breath sounds normal. No stridor. No respiratory distress. She has no wheezes. She has no rales. She exhibits no tenderness.  Neurological: She is alert and oriented to person, place, and time.  Skin: Capillary refill takes less than 2 seconds. She is diaphoretic.  Very hot to the touch  Psychiatric: Her behavior is normal. Judgment and thought content normal.      Assessment & Plan:   1. Fever, unspecified fever cause   2. Myalgia   3. Cough   4. Blurred vision, bilateral     Myalgia Despite Flu Test being negative, your symptoms are very consistent with influenza. Please take Tamiflu as directed. Increase fluids and follow BRAT diet. Please alternate OTC Acetaminophen and Ibuprofen as needed for fever/discomfort. Work Excuse provided, okay to return Monday Nov 01, 2018. Stop tobacco use. Please call clinic with any questions/concerns.  Blurred vision, bilateral She also reports blurred vision the last two days, Snellen Eye Exam 20/40  She has corrective lenses, however has not worn them in year, has not had vision screening in as long. Follow-up with optometrist for blurred vision.  Pt was in the office today for 25+ minutes, I spent >75% of time in face to face counseling of patient's various medical conditions and in coordination of care   FOLLOW-UP:  Return if symptoms worsen or fail to improve.

## 2018-10-31 LAB — CULTURE, BLOOD (ROUTINE X 2)
Culture: NO GROWTH
Culture: NO GROWTH
Special Requests: ADEQUATE

## 2018-11-02 NOTE — Progress Notes (Signed)
Subjective:    Stacie Sanders ID: Stacie Sanders, female    DOB: 25-Dec-1971, 47 y.o.   MRN: [id number]  HPI:  08/04/18 OV: Stacie Sanders is here for hospital follow-up-   06/14/18-06/18/18- "prior history of bipolar disorder, anxiety who presented to the hospital for a suicide attempt secondary to a drug overdose (BuSpar, Neurontin, Paxil, Seroquel, Vistaril as well as EtOH). Stacie Sanders was very groggy and drowsy when Stacie Sanders was brought to the hospital, Stacie Sanders subsequently had a seizure in the emergency room, Stacie Sanders was intubated and admitted to the intensive care unit. Subsequently stabilized-underwent neurology evaluation-extubated on 6/19, and transferred to the trial hospitalist service on 6/21"  07/07/18- 07/30/18- "47 yr old Stacie Sanders known to [facility] service admitted in June 17th for Overdose and respiratory failure, PMHx significant forDM, Bipolar disorder(h/o suicidal ideations in the past), Allergic rhinitis, Anxiety, ADHD, andsevere GERD s/p esophageal dilatation,presents to [facility] via EMS.It appears that recently Stacie Sanders has been struggling with increased depressive episodes.Emergency services were called to the Stacie Sanders's home by her sons when Stacie Sanders was found to be unresponsive. Pt was intubated in the ED for Acute respiratory failure".  07/30/18-08/03/18- Stacie Sanders self enrolled herself into [facility] Stacie Sanders reports attending "Grief Counseling", AA, NA while at facility and plans on continuing with local AA, NA chapters  Issues that have caused her to attempt suicide- 1) chronic cocaine use, and dramatic increase in ETOH use the last two months 2) Husband's recent infidelity and subsequent separation June 2018 Despite repeated attempts Stacie Sanders has not spoken with him or seen him since mid June.  3)  Three + home pregnancy tests (hospital pregnancy test was neg) 4) Two recent overdose's in attempt to gain attention of her husband and "to bring him home" 5) Financial instability, Stacie Sanders has not worked outside the  house since 2013 and completely dependent on her husband who has not sent her money or paid mortgage in months. 6) lack of social support, Stacie Sanders reports only one friend and no local family.  Her youngest son  (age 35) has been staying with friends and "took my dog with him". 7) Stacie Sanders was previously managed by [facility] and has not wish to return for therapy/rx management  Stacie Sanders has appt with [facility] this Monday 08/09/18  11/04/18 OV: Stacie Sanders is here for regular f/u: Stacie Sanders has not established with psychiatrist at [facility], however Stacie Sanders reports attending weekly group meetings. 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 Stacie Sanders reports RUQ pain that is constant dull ache, rated 3/10 that has been present >2 weeks Stacie Sanders denies hematuria/hematochezia Stacie Sanders reports EGD/colonoscopy 2017- polyps detected, instructed to repeat study in 10 years   Stacie Sanders Care Team    Relationship Specialty Notifications Start End  Julaine Fusi, NP PCP - General Family Medicine  02/15/18   Gastroenterology, [facility]    02/15/18   [facility]  Family Medicine  02/15/18   Department, [facility]    02/15/18     Stacie Sanders Active Problem List   Diagnosis Date Noted  . Right upper quadrant abdominal pain 11/04/2018  . Myalgia 10/28/2018  . Blurred vision, bilateral  10/28/2018  . Abnormal urinalysis 10/11/2018  . Bipolar disorder, unspecified (HCC) 07/30/2018  . Respiratory failure (HCC) 07/28/2018  . Cocaine use disorder, mild, abuse (HCC)   . Bipolar disorder (HCC) 06/18/2018  . Fatigue 03/01/2018  . GAD (generalized anxiety  disorder) 03/01/2018  . Gingivitis 03/01/2018  . Healthcare maintenance 02/15/2018  . Depression 02/15/2018  . Bipolar 1 disorder (HCC) 02/15/2018  . Cough in adult 02/15/2018  . Acute bacterial conjunctivitis of right eye 02/15/2018  . Acute encephalopathy   . Drug overdose 12/29/2015     Past Medical History:  Diagnosis Date  . Allergic rhinitis   . Bipolar 1 disorder (HCC)   . Borderline diabetes mellitus   . Colon polyp   . Depression   . Dyspnea on exertion   . Esophageal stricture    scheduled for EGD and dilation 03-18-2016  . GAD (generalized anxiety disorder)   . History of acute respiratory failure    12-29-2015  due to polysubstance overdose--  pt intubated --  resolved   . History of febrile seizure    x1  01/ 2017--  none since  . History of kidney stones    staghorn  . History of suicide attempt    12-29-2015  -- overdose klonopin, xanax, alcohol--  acute respirtory failure and acute encenphalopathy -- both resolved  . IBS (irritable bowel syndrome)   . Irregular menstrual cycle   . Renal calculi    bilateral     Past Surgical History:  Procedure Laterality Date  . COLONOSCOPY    . CYSTOSCOPY WITH STENT PLACEMENT Bilateral 03/10/2016   Procedure: CYSTOSCOPY WITH STENT PLACEMENT;  Surgeon: Malen Gauze, MD;  Location: [facility];  Service: Urology;  Laterality: Bilateral;  . CYSTOSCOPY/RETROGRADE/URETEROSCOPY/STONE EXTRACTION WITH BASKET Bilateral 03/10/2016   Procedure: CYSTOSCOPY/RETROGRADE/URETEROSCOPY/STONE EXTRACTION WITH BASKET;  Surgeon: Malen Gauze, MD;  Location: [facility];  Service: Urology;  Laterality: Bilateral;  . EXTRACORPOREAL SHOCK WAVE LITHOTRIPSY  x2    . PERCUTANEOUS NEPHROSTOLITHOTOMY Right 09-18-2005   staghorn  . TUBAL LIGATION  1999     Family History  Problem Relation Age of Onset  . Cancer Father        pancreas  . Melanoma Father   . Cancer Maternal Grandmother        lung  .  COPD Paternal Grandmother   . Melanoma Paternal Grandmother      Social History   Substance and Sexual Activity  Drug Use No     Social History   Substance and Sexual Activity  Alcohol Use Yes   Comment: occ     Social History   Tobacco Use  Smoking Status Current Every Day Smoker  . Packs/day: 0.50  . Years: 20.00  . Pack years: 10.00  . Types: Cigarettes  Smokeless Tobacco Never Used     Outpatient Encounter Medications as of 11/04/2018  Medication Sig  . hydrOXYzine (ATARAX/VISTARIL) 50 MG tablet Take 1 tablet (50 mg total) by mouth 3 (three) times daily as needed for anxiety.  Marland Kitchen ibuprofen (ADVIL,MOTRIN) 600 MG tablet Take 1 tablet (600 mg total) by mouth every 8 (eight) hours as needed.  Marland Kitchen omeprazole (PRILOSEC) 40 MG capsule Take 1 capsule (40 mg total) by mouth daily.  . ondansetron (ZOFRAN ODT) 4 MG disintegrating tablet Take 1 tablet (4 mg total) by mouth every 8 (eight) hours as needed for nausea or vomiting.  Marland Kitchen PARoxetine (PAXIL) 10 MG tablet Take 1 tablet (10 mg total)  by mouth daily.  . traZODone (DESYREL) 100 MG tablet Take 1 tablet (100 mg total) by mouth at bedtime as needed and may repeat dose one time if needed for sleep.  . [DISCONTINUED] hydrOXYzine (ATARAX/VISTARIL) 50 MG tablet Take 1 tablet (50 mg total) by mouth 3 (three) times daily as needed for anxiety.  . [DISCONTINUED] meloxicam (MOBIC) 7.5 MG tablet Take 15 mg by mouth daily as needed for pain.  . [DISCONTINUED] PARoxetine (PAXIL) 10 MG tablet Take 1 tablet (10 mg total) by mouth daily.  . [DISCONTINUED] traZODone (DESYREL) 100 MG tablet Take 1 tablet (100 mg total) by mouth at bedtime as needed and may repeat dose one time if needed for sleep.  . [DISCONTINUED] metroNIDAZOLE (FLAGYL) 500 MG tablet Take 1 tablet (500 mg total) by mouth 2 (two) times daily.  . [DISCONTINUED] oseltamivir (TAMIFLU) 75 MG capsule Take 1 capsule (75 mg total) by mouth 2 (two) times daily.   No facility-administered  encounter medications on file as of 11/04/2018.     Allergies: Fentanyl; Percocet [oxycodone-acetaminophen]; and Robaxin [methocarbamol]  Body mass index is 30.85 kg/m.  Blood pressure 102/70, pulse 74, temperature 97.8 F (36.6 C), temperature source Oral, height 5\' 5"  (1.651 m), weight 185 lb 6.4 oz (84.1 kg), SpO2 99 %.  Review of Systems  Constitutional: Positive for activity change and fatigue. Negative for appetite change, chills, diaphoresis, fever and unexpected weight change.  Eyes: Negative for visual disturbance.  Respiratory: Negative for cough, chest tightness, shortness of breath, wheezing and stridor.   Cardiovascular: Negative for chest pain, palpitations and leg swelling.  Gastrointestinal: Positive for abdominal pain. Negative for abdominal distention, blood in stool, constipation, diarrhea, nausea and vomiting.  Genitourinary: Negative for difficulty urinating and flank pain.  Musculoskeletal: Positive for arthralgias, myalgias, neck pain and neck stiffness. Negative for back pain and gait problem.  Skin: Negative for color change, pallor, rash and wound.  Neurological: Positive for headaches. Negative for dizziness.  Hematological: Does not bruise/bleed easily.  Psychiatric/Behavioral: Positive for dysphoric mood and sleep disturbance. Negative for agitation, behavioral problems, confusion, decreased concentration, hallucinations, self-injury and suicidal ideas. The Stacie Sanders is not nervous/anxious and is not hyperactive.        Objective:   Physical Exam  Constitutional: Stacie Sanders is oriented to person, place, and time. Stacie Sanders appears well-developed and well-nourished. No distress.  HENT:  Head: Normocephalic and atraumatic.  Right Ear: External ear normal.  Left Ear: External ear normal.  Nose: Nose normal.  Mouth/Throat: Oropharynx is clear and moist.  Eyes: Pupils are equal, round, and reactive to light. Conjunctivae and EOM are normal.  Cardiovascular: Normal rate,  regular rhythm, normal heart sounds and intact distal pulses.  No murmur heard. Pulmonary/Chest: Effort normal and breath sounds normal. No stridor. No respiratory distress. Stacie Sanders has no wheezes. Stacie Sanders has no rales. Stacie Sanders exhibits no tenderness.  Abdominal: Soft. Bowel sounds are normal. Stacie Sanders exhibits no distension and no mass. There is tenderness in the right upper quadrant. There is no rebound, no guarding, no CVA tenderness, no tenderness at McBurney's point and negative Murphy's sign. No hernia.  Neurological: Stacie Sanders is alert and oriented to person, place, and time.  Skin: Skin is warm and dry. Capillary refill takes less than 2 seconds. No rash noted. Stacie Sanders is not diaphoretic. No erythema. No pallor.  Psychiatric: Stacie Sanders has a normal mood and affect. Her speech is normal and behavior is normal. Judgment and thought content normal. Cognition and memory are normal.  Nursing note and vitals  reviewed.     Assessment & Plan:   1. Pelvic pain in female   2. Right upper quadrant abdominal pain   3. Healthcare maintenance   4. GAD (generalized anxiety disorder)     Healthcare maintenance Continue all medications as directed. Increase water intake, strive for at least 90 ounces/day.   Follow Mediterranean diet Increase regular exercise.  Recommend at least 30 minutes daily, 5 days per week of walking, jogging, biking, swimming, YouTube/Pinterest workout videos. Reduce -stop tobacco use- YOU CAN DO IT! Continue with weekly meetings at [facility]. When your insurance plan coverage begins, call me and I will refer you to private Behavioral Health facility. One final refill on mental health medications provided, once you establish with psychiatry they will take over. Abdominal ultrasound order to address right upper quadrant pain. Urinalysis was normal. Please schedule complete physical with fasting labs in 3 months.  GAD (generalized anxiety disorder) Depression screen [facility] 2/9 10/28/2018 10/11/2018 08/04/2018   Decreased Interest 0 1 1  Down, Depressed, Hopeless 1 1 1   PHQ - 2 Score 1 2 2   Altered sleeping 0 1 3  Tired, decreased energy 1 1 1   Change in appetite 1 1 1   Feeling bad or failure about yourself  1 1 1   Trouble concentrating 0 2 3  Moving slowly or fidgety/restless 0 1 0  Suicidal thoughts 0 0 0  PHQ-9 Score 4 9 11   Difficult doing work/chores Somewhat difficult Somewhat difficult Somewhat difficult   Continued on Paroxetine 10mg  QD Continue with [facility] group meeting, will refer to private Behavioral Health once insurance coverage begins   Right upper quadrant abdominal pain UA neg Abd [place] ordered  Pt was in the office today for 45+ minutes, I spent >75% of  time in face to face counseling of various medical concerns and in coordination of care  FOLLOW-UP:  Return in about 3 months (around 02/04/2019) for CPE, Fasting Labs.

## 2018-11-04 ENCOUNTER — Ambulatory Visit (INDEPENDENT_AMBULATORY_CARE_PROVIDER_SITE_OTHER): Payer: Self-pay | Admitting: Adult Health

## 2018-11-04 ENCOUNTER — Encounter: Payer: Self-pay | Admitting: Adult Health

## 2018-11-04 VITALS — BP 102/70 | HR 74 | Temp 97.8°F | Ht 65.0 in | Wt 185.4 lb

## 2018-11-04 DIAGNOSIS — R1011 Right upper quadrant pain: Secondary | ICD-10-CM | POA: Insufficient documentation

## 2018-11-04 DIAGNOSIS — Z0000 Encounter for general adult medical examination without abnormal findings: Secondary | ICD-10-CM

## 2018-11-04 DIAGNOSIS — F411 Generalized anxiety disorder: Secondary | ICD-10-CM

## 2018-11-04 DIAGNOSIS — R102 Pelvic and perineal pain: Secondary | ICD-10-CM

## 2018-11-04 LAB — POCT URINALYSIS DIPSTICK
Bilirubin, UA: NEGATIVE
GLUCOSE UA: NEGATIVE
Ketones, UA: NEGATIVE
LEUKOCYTES UA: NEGATIVE
Nitrite, UA: NEGATIVE
Protein, UA: NEGATIVE
RBC UA: NEGATIVE
Spec Grav, UA: 1.02 (ref 1.010–1.025)
Urobilinogen, UA: 0.2 E.U./dL
pH, UA: 6.5 (ref 5.0–8.0)

## 2018-11-04 MED ORDER — HYDROXYZINE HCL 50 MG PO TABS: 50.0000 mg | ORAL_TABLET | Freq: Three times a day (TID) | ORAL | 2 refills | Status: DC | PRN

## 2018-11-04 MED ORDER — PAROXETINE HCL 10 MG PO TABS: 10.0000 mg | ORAL_TABLET | Freq: Every day | ORAL | 2 refills | Status: DC

## 2018-11-04 MED ORDER — TRAZODONE HCL 100 MG PO TABS: 100.0000 mg | ORAL_TABLET | Freq: Every evening | ORAL | 2 refills | Status: DC | PRN

## 2018-11-04 NOTE — Assessment & Plan Note (Addendum)
UA neg Abd [place] ordered

## 2018-11-04 NOTE — Patient Instructions (Signed)
Generalized Anxiety Disorder, Adult Generalized anxiety disorder (GAD) is a mental health disorder. People with this condition constantly worry about everyday events. Unlike normal anxiety, worry related to GAD is not triggered by a specific event. These worries also do not fade or get better with time. GAD interferes with life functions, including relationships, work, and school. GAD can vary from mild to severe. People with severe GAD can have intense waves of anxiety with physical symptoms (panic attacks). What are the causes? The exact cause of GAD is not known. What increases the risk? This condition is more likely to develop in:  Women.  People who have a family history of anxiety disorders.  People who are very shy.  People who experience very stressful life events, such as the death of a loved one.  People who have a very stressful family environment.  What are the signs or symptoms? People with GAD often worry excessively about many things in their lives, such as their health and family. They may also be overly concerned about:  Doing well at work.  Being on time.  Natural disasters.  Friendships.  Physical symptoms of GAD include:  Fatigue.  Muscle tension or having muscle twitches.  Trembling or feeling shaky.  Being easily startled.  Feeling like your heart is pounding or racing.  Feeling out of breath or like you cannot take a deep breath.  Having trouble falling asleep or staying asleep.  Sweating.  Nausea, diarrhea, or irritable bowel syndrome (IBS).  Headaches.  Trouble concentrating or remembering facts.  Restlessness.  Irritability.  How is this diagnosed? Your health care provider can diagnose GAD based on your symptoms and medical history. You will also have a physical exam. The health care provider will ask specific questions about your symptoms, including how severe they are, when they started, and if they come and go. Your health care  provider may ask you about your use of alcohol or drugs, including prescription medicines. Your health care provider may refer you to a mental health specialist for further evaluation. Your health care provider will do a thorough examination and may perform additional tests to rule out other possible causes of your symptoms. To be diagnosed with GAD, a person must have anxiety that:  Is out of his or her control.  Affects several different aspects of his or her life, such as work and relationships.  Causes distress that makes him or her unable to take part in normal activities.  Includes at least three physical symptoms of GAD, such as restlessness, fatigue, trouble concentrating, irritability, muscle tension, or sleep problems.  Before your health care provider can confirm a diagnosis of GAD, these symptoms must be present more days than they are not, and they must last for six months or longer. How is this treated? The following therapies are usually used to treat GAD:  Medicine. Antidepressant medicine is usually prescribed for long-term daily control. Antianxiety medicines may be added in severe cases, especially when panic attacks occur.  Talk therapy (psychotherapy). Certain types of talk therapy can be helpful in treating GAD by providing support, education, and guidance. Options include: ? Cognitive behavioral therapy (CBT). People learn coping skills and techniques to ease their anxiety. They learn to identify unrealistic or negative thoughts and behaviors and to replace them with positive ones. ? Acceptance and commitment therapy (ACT). This treatment teaches people how to be mindful as a way to cope with unwanted thoughts and feelings. ? Biofeedback. This process trains you to   manage your body's response (physiological response) through breathing techniques and relaxation methods. You will work with a therapist while machines are used to monitor your physical symptoms.  Stress  management techniques. These include yoga, meditation, and exercise.  A mental health specialist can help determine which treatment is best for you. Some people see improvement with one type of therapy. However, other people require a combination of therapies. Follow these instructions at home:  Take over-the-counter and prescription medicines only as told by your health care provider.  Try to maintain a normal routine.  Try to anticipate stressful situations and allow extra time to manage them.  Practice any stress management or self-calming techniques as taught by your health care provider.  Do not punish yourself for setbacks or for not making progress.  Try to recognize your accomplishments, even if they are small.  Keep all follow-up visits as told by your health care provider. This is important. Contact a health care provider if:  Your symptoms do not get better.  Your symptoms get worse.  You have signs of depression, such as: ? A persistently sad, cranky, or irritable mood. ? Loss of enjoyment in activities that used to bring you joy. ? Change in weight or eating. ? Changes in sleeping habits. ? Avoiding friends or family members. ? Loss of energy for normal tasks. ? Feelings of guilt or worthlessness. Get help right away if:  You have serious thoughts about hurting yourself or others. If you ever feel like you may hurt yourself or others, or have thoughts about taking your own life, get help right away. You can go to your nearest emergency department or call:  Your local emergency services (911 in the [place]  A suicide crisis helpline, such as the National Suicide Prevention Lifeline at (509)477-8053. This is open 24 hours a day.  Summary  Generalized anxiety disorder (GAD) is a mental health disorder that involves worry that is not triggered by a specific event.  People with GAD often worry excessively about many things in their lives, such as their health and  family.  GAD may cause physical symptoms such as restlessness, trouble concentrating, sleep problems, frequent sweating, nausea, diarrhea, headaches, and trembling or muscle twitching.  A mental health specialist can help determine which treatment is best for you. Some people see improvement with one type of therapy. However, other people require a combination of therapies. This information is not intended to replace advice given to you by your health care provider. Make sure you discuss any questions you have with your health care provider. Document Released: 04/11/2013 Document Revised: 11/04/2016 Document Reviewed: 11/04/2016 Elsevier Interactive Patient Education  2018 [employer].   Mediterranean Diet A Mediterranean diet refers to food and lifestyle choices that are based on the traditions of countries located on the [employer]. This way of eating has been shown to help prevent certain conditions and improve outcomes for people who have chronic diseases, like kidney disease and heart disease. What are tips for following this plan? Lifestyle  Cook and eat meals together with your family, when possible.  Drink enough fluid to keep your urine clear or pale yellow.  Be physically active every day. This includes: ? Aerobic exercise like running or swimming. ? Leisure activities like gardening, walking, or housework.  Get 7-8 hours of sleep each night.  Reading food labels  Check the serving size of packaged foods. For foods such as rice and pasta, the serving size refers to the amount of  cooked product, not dry.  Check the total fat in packaged foods. Avoid foods that have saturated fat or trans fats.  Check the ingredients list for added sugars, such as corn syrup. Shopping  At the grocery store, buy most of your food from the areas near the walls of the store. This includes: ? Fresh fruits and vegetables (produce). ? Grains, beans, nuts, and seeds. Some of these may be  available in unpackaged forms or large amounts (in bulk). ? Fresh seafood. ? Poultry and eggs. ? Low-fat dairy products.  Buy whole ingredients instead of prepackaged foods.  Buy fresh fruits and vegetables in-season from local farmers markets.  Buy frozen fruits and vegetables in resealable bags.  If you do not have access to quality fresh seafood, buy precooked frozen shrimp or canned fish, such as tuna, salmon, or sardines.  Buy small amounts of raw or cooked vegetables, salads, or olives from the deli or salad bar at your store.  Stock your pantry so you always have certain foods on hand, such as olive oil, canned tuna, canned tomatoes, rice, pasta, and beans. Cooking  Cook foods with extra-virgin olive oil instead of using butter or other vegetable oils.  Have meat as a side dish, and have vegetables or grains as your main dish. This means having meat in small portions or adding small amounts of meat to foods like pasta or stew.  Use beans or vegetables instead of meat in common dishes like chili or lasagna.  Experiment with different cooking methods. Try roasting or broiling vegetables instead of steaming or sauteing them.  Add frozen vegetables to soups, stews, pasta, or rice.  Add nuts or seeds for added healthy fat at each meal. You can add these to yogurt, salads, or vegetable dishes.  Marinate fish or vegetables using olive oil, lemon juice, garlic, and fresh herbs. Meal planning  Plan to eat 1 vegetarian meal one day each week. Try to work up to 2 vegetarian meals, if possible.  Eat seafood 2 or more times a week.  Have healthy snacks readily available, such as: ? Vegetable sticks with hummus. ? [place] yogurt. ? Fruit and nut trail mix.  Eat balanced meals throughout the week. This includes: ? Fruit: 2-3 servings a day ? Vegetables: 4-5 servings a day ? Low-fat dairy: 2 servings a day ? Fish, poultry, or lean meat: 1 serving a day ? Beans and legumes: 2 or  more servings a week ? Nuts and seeds: 1-2 servings a day ? Whole grains: 6-8 servings a day ? Extra-virgin olive oil: 3-4 servings a day  Limit red meat and sweets to only a few servings a month What are my food choices?  Mediterranean diet ? Recommended ? Grains: Whole-grain pasta. Brown rice. Bulgar wheat. Polenta. Couscous. Whole-wheat bread. Orpah Cobb. ? Vegetables: Artichokes. Beets. Broccoli. Cabbage. Carrots. Eggplant. Green beans. Chard. Kale. Spinach. Onions. Leeks. Peas. Squash. Tomatoes. Peppers. Radishes. ? Fruits: Apples. Apricots. Avocado. Berries. Bananas. Cherries. Dates. Figs. Grapes. Lemons. Melon. Oranges. Peaches. Plums. Pomegranate. ? Meats and other protein foods: Beans. Almonds. Sunflower seeds. Pine nuts. Peanuts. Cod. Salmon. Scallops. Shrimp. Tuna. Tilapia. Clams. Oysters. Eggs. ? Dairy: Low-fat milk. Cheese. Greek yogurt. ? Beverages: Water. Red wine. Herbal tea. ? Fats and oils: Extra virgin olive oil. Avocado oil. Grape seed oil. ? Sweets and desserts: [place] yogurt with honey. Baked apples. Poached pears. Trail mix. ? Seasoning and other foods: Basil. Cilantro. Coriander. Cumin. Mint. Parsley. Sage. Rosemary. Tarragon. Garlic. Oregano. Thyme. Pepper.  Balsalmic vinegar. Tahini. Hummus. Tomato sauce. Olives. Mushrooms. ? Limit these ? Grains: Prepackaged pasta or rice dishes. Prepackaged cereal with added sugar. ? Vegetables: Deep fried potatoes (french fries). ? Fruits: Fruit canned in syrup. ? Meats and other protein foods: Beef. Pork. Lamb. Poultry with skin. Hot dogs. Tomasa Blase. ? Dairy: Ice cream. Sour cream. Whole milk. ? Beverages: Juice. Sugar-sweetened soft drinks. Beer. Liquor and spirits. ? Fats and oils: Butter. Canola oil. Vegetable oil. Beef fat (tallow). Lard. ? Sweets and desserts: Cookies. Cakes. Pies. Candy. ? Seasoning and other foods: Mayonnaise. Premade sauces and marinades. ? The items listed may not be a complete list. Talk with your  dietitian about what dietary choices are right for you. Summary  The Mediterranean diet includes both food and lifestyle choices.  Eat a variety of fresh fruits and vegetables, beans, nuts, seeds, and whole grains.  Limit the amount of red meat and sweets that you eat.  Talk with your health care provider about whether it is safe for you to drink red wine in moderation. This means 1 glass a day for nonpregnant women and 2 glasses a day for men. A glass of wine equals 5 oz (150 mL). This information is not intended to replace advice given to you by your health care provider. Make sure you discuss any questions you have with your health care provider. Document Released: 08/07/2016 Document Revised: 09/09/2016 Document Reviewed: 08/07/2016 Elsevier Interactive Patient Education  2018 [employer].  Continue all medications as directed. Increase water intake, strive for at least 90 ounces/day.   Follow Mediterranean diet Increase regular exercise.  Recommend at least 30 minutes daily, 5 days per week of walking, jogging, biking, swimming, YouTube/Pinterest workout videos. Reduce -stop tobacco use- YOU CAN DO IT! Continue with weekly meetings at [facility]. When your insurance plan coverage begins, call me and I will refer you to private Behavioral Health facility. One final refill on mental health medications provided, once you establish with psychiatry they will take over. Abdominal ultrasound order to address right upper quadrant pain. Urinalysis was normal. Please schedule complete physical with fasting labs in 3 months. NICE TO SEE YOU!

## 2018-11-04 NOTE — Assessment & Plan Note (Signed)
Depression screen [facility] 2/9 10/28/2018 10/11/2018 08/04/2018  Decreased Interest 0 1 1  Down, Depressed, Hopeless 1 1 1   PHQ - 2 Score 1 2 2   Altered sleeping 0 1 3  Tired, decreased energy 1 1 1   Change in appetite 1 1 1   Feeling bad or failure about yourself  1 1 1   Trouble concentrating 0 2 3  Moving slowly or fidgety/restless 0 1 0  Suicidal thoughts 0 0 0  PHQ-9 Score 4 9 11   Difficult doing work/chores Somewhat difficult Somewhat difficult Somewhat difficult   Continued on Paroxetine 10mg  QD Continue with [facility] group meeting, will refer to private Behavioral Health once insurance coverage begins

## 2018-11-04 NOTE — Assessment & Plan Note (Signed)
Continue all medications as directed. Increase water intake, strive for at least 90 ounces/day.   Follow Mediterranean diet Increase regular exercise.  Recommend at least 30 minutes daily, 5 days per week of walking, jogging, biking, swimming, YouTube/Pinterest workout videos. Reduce -stop tobacco use- YOU CAN DO IT! Continue with weekly meetings at [facility]. When your insurance plan coverage begins, call me and I will refer you to private Behavioral Health facility. One final refill on mental health medications provided, once you establish with psychiatry they will take over. Abdominal ultrasound order to address right upper quadrant pain. Urinalysis was normal. Please schedule complete physical with fasting labs in 3 months.

## 2018-11-08 ENCOUNTER — Other Ambulatory Visit: Payer: Self-pay

## 2018-11-18 ENCOUNTER — Other Ambulatory Visit: Payer: Self-pay

## 2018-12-02 ENCOUNTER — Telehealth: Payer: Self-pay | Admitting: Adult Health

## 2018-12-02 MED ORDER — ALBUTEROL SULFATE HFA 108 (90 BASE) MCG/ACT IN AERS: 2.0000 | INHALATION_SPRAY | Freq: Four times a day (QID) | RESPIRATORY_TRACT | 0 refills | Status: DC | PRN

## 2018-12-02 NOTE — Telephone Encounter (Signed)
Patient is requesting a call back from clinic staff about an inhaler refill and possible other meds that she can get for what seems to be a chest cold to the patients. She has been sick since Thanksgiving and is currently scheduled for an acute sick visit on Monday. Please advise

## 2018-12-02 NOTE — Addendum Note (Signed)
Addended by: Stan HeadNELSON, Weylyn Ricciuti S on: 12/02/2018 04:56 PM   Modules accepted: Orders

## 2018-12-02 NOTE — Telephone Encounter (Signed)
Per William HamburgerKaty Danford, NP refill for albuterol inhaler sent to pharmacy.  Advised pt that she may take OTC Mucinex for congestion until she is seen on 12/06/18.  [name]. Sesilia Poucher, CMA

## 2018-12-05 NOTE — Progress Notes (Signed)
zith  Subjective:    Patient ID: Stacie Sanders, female    DOB: 08/25/1971, 47 y.o.   MRN: [id number]  HPI:  [name] presents with productive cough (green mucus), nasal drainage (green), and increased fatigue that has been steadily worsening the lat 2 weeks. She has been alternating OTC DayQuil and NyQuil and reducing tobacco use- down to quarter pack/day She denies CP/palpitations She denies N/V/D  Patient Care Team    Relationship Specialty Notifications Start End  Julaine Fusi, NP PCP - General Family Medicine  02/15/18   Gastroenterology, [facility]    02/15/18   [facility]  Family Medicine  02/15/18   Department, [facility]    02/15/18     Patient Active Problem List   Diagnosis Date Noted  . Bronchitis 12/06/2018  . Right upper quadrant abdominal pain 11/04/2018  . Myalgia 10/28/2018  . Blurred vision, bilateral 10/28/2018  . Abnormal urinalysis 10/11/2018  . Bipolar disorder, unspecified (HCC) 07/30/2018  . Respiratory failure (HCC) 07/28/2018  . Cocaine use disorder, mild, abuse (HCC)   . Bipolar disorder (HCC) 06/18/2018  . Fatigue 03/01/2018  . GAD (generalized anxiety disorder) 03/01/2018  . Gingivitis 03/01/2018  . Healthcare maintenance 02/15/2018  . Depression 02/15/2018  . Bipolar 1 disorder (HCC) 02/15/2018  . Cough in adult 02/15/2018  . Acute bacterial conjunctivitis of right eye 02/15/2018  . Acute encephalopathy   . Drug overdose 12/29/2015     Past Medical History:  Diagnosis Date  . Allergic rhinitis   . Bipolar 1 disorder (HCC)   . Borderline diabetes mellitus   . Colon polyp   . Depression   . Dyspnea on exertion   . Esophageal stricture    scheduled for EGD and dilation 03-18-2016  . GAD (generalized anxiety disorder)   . History of acute respiratory failure    12-29-2015  due to polysubstance overdose--  pt intubated --  resolved   . History of febrile seizure    x1  01/ 2017--  none since  . History  of kidney stones    staghorn  . History of suicide attempt    12-29-2015  -- overdose klonopin, xanax, alcohol--  acute respirtory failure and acute encenphalopathy -- both resolved  . IBS (irritable bowel syndrome)   . Irregular menstrual cycle   . Renal calculi    bilateral     Past Surgical History:  Procedure Laterality Date  . COLONOSCOPY    . CYSTOSCOPY WITH STENT PLACEMENT Bilateral 03/10/2016   Procedure: CYSTOSCOPY WITH STENT PLACEMENT;  Surgeon: Malen Gauze, MD;  Location: [facility];  Service: Urology;  Laterality: Bilateral;  . CYSTOSCOPY/RETROGRADE/URETEROSCOPY/STONE EXTRACTION WITH BASKET Bilateral 03/10/2016   Procedure: CYSTOSCOPY/RETROGRADE/URETEROSCOPY/STONE EXTRACTION WITH BASKET;  Surgeon: Malen Gauze, MD;  Location: [facility];  Service: Urology;  Laterality: Bilateral;  . EXTRACORPOREAL SHOCK WAVE LITHOTRIPSY  x2    . PERCUTANEOUS NEPHROSTOLITHOTOMY Right 09-18-2005   staghorn  . TUBAL LIGATION  1999     Family History  Problem Relation Age of Onset  . Cancer Father        pancreas  . Melanoma Father   . Cancer Maternal Grandmother        lung  . COPD Paternal Grandmother   . Melanoma Paternal Grandmother      Social History   Substance and Sexual Activity  Drug Use No     Social History   Substance and Sexual Activity  Alcohol  Use Yes   Comment: occ     Social History   Tobacco Use  Smoking Status Current Every Day Smoker  . Packs/day: 0.50  . Years: 20.00  . Pack years: 10.00  . Types: Cigarettes  Smokeless Tobacco Never Used     Outpatient Encounter Medications as of 12/06/2018  Medication Sig  . albuterol (PROVENTIL HFA;VENTOLIN HFA) 108 (90 Base) MCG/ACT inhaler Inhale 2 puffs into the lungs every 6 (six) hours as needed for wheezing or shortness of breath.  . hydrOXYzine (ATARAX/VISTARIL) 50 MG tablet Take 1 tablet (50 mg total) by mouth 3 (three) times daily as needed for  anxiety.  Marland Kitchen. ibuprofen (ADVIL,MOTRIN) 600 MG tablet Take 1 tablet (600 mg total) by mouth every 8 (eight) hours as needed.  Marland Kitchen. omeprazole (PRILOSEC) 40 MG capsule Take 1 capsule (40 mg total) by mouth daily.  . ondansetron (ZOFRAN ODT) 4 MG disintegrating tablet Take 1 tablet (4 mg total) by mouth every 8 (eight) hours as needed for nausea or vomiting.  Marland Kitchen. PARoxetine (PAXIL) 10 MG tablet Take 1 tablet (10 mg total) by mouth daily.  . traZODone (DESYREL) 100 MG tablet Take 1 tablet (100 mg total) by mouth at bedtime as needed and may repeat dose one time if needed for sleep.  Marland Kitchen. azithromycin (ZITHROMAX) 250 MG tablet 2 tabs day one.  1 tab days two-five  . benzonatate (TESSALON) 200 MG capsule Take 1 capsule (200 mg total) by mouth 2 (two) times daily as needed for cough.   No facility-administered encounter medications on file as of 12/06/2018.     Allergies: Fentanyl; Percocet [oxycodone-acetaminophen]; and Robaxin [methocarbamol]  Body mass index is 31.35 kg/m.  Blood pressure 111/75, pulse 84, temperature 97.9 F (36.6 C), temperature source Oral, height 5\' 5"  (1.651 m), weight 188 lb 6.4 oz (85.5 kg), SpO2 97 %. Review of Systems  Constitutional: Positive for activity change and fatigue. Negative for appetite change, chills, diaphoresis, fever and unexpected weight change.  HENT: Positive for congestion, postnasal drip, sinus pressure and voice change. Negative for sinus pain and trouble swallowing.   Eyes: Negative for visual disturbance.  Respiratory: Positive for cough and shortness of breath. Negative for chest tightness, wheezing and stridor.   Cardiovascular: Negative for chest pain, palpitations and leg swelling.  Gastrointestinal: Negative for abdominal distention, anal bleeding, blood in stool, constipation, diarrhea, nausea and vomiting.  Endocrine: Negative for cold intolerance, heat intolerance, polydipsia, polyphagia and polyuria.  Neurological: Negative for dizziness and  headaches.  Psychiatric/Behavioral: Positive for sleep disturbance.       Objective:   Physical Exam  Constitutional: She is oriented to person, place, and time. She appears well-developed and well-nourished. No distress.  HENT:  Head: Normocephalic and atraumatic.  Right Ear: External ear normal. Tympanic membrane is bulging. Tympanic membrane is not erythematous. No decreased hearing is noted.  Left Ear: External ear normal. Tympanic membrane is bulging. Tympanic membrane is not erythematous. No decreased hearing is noted.  Nose: Mucosal edema and rhinorrhea present. Right sinus exhibits no maxillary sinus tenderness and no frontal sinus tenderness. Left sinus exhibits no maxillary sinus tenderness and no frontal sinus tenderness.  Mouth/Throat: Mucous membranes are dry. Abnormal dentition. Posterior oropharyngeal edema and posterior oropharyngeal erythema present. Tonsils are 0 on the right. Tonsils are 0 on the left. No tonsillar exudate.  Eyes: Pupils are equal, round, and reactive to light. Conjunctivae and EOM are normal.  Neck: Normal range of motion. Neck supple.  Cardiovascular: Normal rate, regular rhythm,  normal heart sounds and intact distal pulses.  Pulmonary/Chest: Effort normal and breath sounds normal. No stridor. No respiratory distress. She has no wheezes. She has no rales.  Lymphadenopathy:    She has no cervical adenopathy.  Neurological: She is alert and oriented to person, place, and time.  Skin: Skin is warm and dry. Capillary refill takes less than 2 seconds. No rash noted. She is not diaphoretic. No erythema. No pallor.  Psychiatric: She has a normal mood and affect. Her behavior is normal. Judgment and thought content normal.  Nursing note and vitals reviewed.     Assessment & Plan:   1. Bronchitis     Bronchitis Please take Azithromycin as directed and Tessalon as needed. Increase fluids/rest/Vit c-2,000 per day when not feeling well. Continue to  alternate OTC Acetaminophen and Ibuprofen as needed for discomfort. Continue to reduce to stop tobacco use-you can do it! If symptoms persist after antibiotic completed, then please call clinic.    FOLLOW-UP:  Return if symptoms worsen or fail to improve.

## 2018-12-06 ENCOUNTER — Ambulatory Visit (INDEPENDENT_AMBULATORY_CARE_PROVIDER_SITE_OTHER): Payer: Self-pay | Admitting: Adult Health

## 2018-12-06 ENCOUNTER — Encounter: Payer: Self-pay | Admitting: Adult Health

## 2018-12-06 VITALS — BP 111/75 | HR 84 | Temp 97.9°F | Ht 65.0 in | Wt 188.4 lb

## 2018-12-06 DIAGNOSIS — J40 Bronchitis, not specified as acute or chronic: Secondary | ICD-10-CM | POA: Insufficient documentation

## 2018-12-06 MED ORDER — BENZONATATE 200 MG PO CAPS: 200.0000 mg | ORAL_CAPSULE | Freq: Two times a day (BID) | ORAL | 0 refills | Status: DC | PRN

## 2018-12-06 MED ORDER — AZITHROMYCIN 250 MG PO TABS: ORAL_TABLET | ORAL | 0 refills | Status: DC

## 2018-12-06 NOTE — Assessment & Plan Note (Signed)
Please take Azithromycin as directed and Tessalon as needed. Increase fluids/rest/Vit c-2,000 per day when not feeling well. Continue to alternate OTC Acetaminophen and Ibuprofen as needed for discomfort. Continue to reduce to stop tobacco use-you can do it! If symptoms persist after antibiotic completed, then please call clinic.

## 2018-12-06 NOTE — Patient Instructions (Signed)

## 2019-01-14 ENCOUNTER — Encounter: Payer: Self-pay | Admitting: Adult Health

## 2019-01-14 ENCOUNTER — Ambulatory Visit (INDEPENDENT_AMBULATORY_CARE_PROVIDER_SITE_OTHER): Payer: Self-pay | Admitting: Adult Health

## 2019-01-14 VITALS — BP 104/66 | HR 91 | Temp 98.0°F | Ht 65.0 in | Wt 192.5 lb

## 2019-01-14 DIAGNOSIS — Z113 Encounter for screening for infections with a predominantly sexual mode of transmission: Secondary | ICD-10-CM

## 2019-01-14 DIAGNOSIS — Z7251 High risk heterosexual behavior: Secondary | ICD-10-CM

## 2019-01-14 DIAGNOSIS — R5383 Other fatigue: Secondary | ICD-10-CM

## 2019-01-14 DIAGNOSIS — R6889 Other general symptoms and signs: Secondary | ICD-10-CM | POA: Insufficient documentation

## 2019-01-14 DIAGNOSIS — F411 Generalized anxiety disorder: Secondary | ICD-10-CM

## 2019-01-14 DIAGNOSIS — Z114 Encounter for screening for human immunodeficiency virus [HIV]: Secondary | ICD-10-CM

## 2019-01-14 MED ORDER — BENZONATATE 200 MG PO CAPS: 200.0000 mg | ORAL_CAPSULE | Freq: Two times a day (BID) | ORAL | 0 refills | Status: DC | PRN

## 2019-01-14 MED ORDER — ONDANSETRON 4 MG PO TBDP: 4.0000 mg | ORAL_TABLET | Freq: Three times a day (TID) | ORAL | 0 refills | Status: DC | PRN

## 2019-01-14 MED ORDER — PAROXETINE HCL 10 MG PO TABS: 10.0000 mg | ORAL_TABLET | Freq: Every day | ORAL | 2 refills | Status: DC

## 2019-01-14 NOTE — Assessment & Plan Note (Signed)
Flu Test Negative  

## 2019-01-14 NOTE — Assessment & Plan Note (Signed)
Mood Stable Denies thoughts of harming herself/others She refuses to establish with psychiatrist, re: cost Refilled Paroxtine 10mg  QD

## 2019-01-14 NOTE — Assessment & Plan Note (Signed)
Continue all medications as directed. Reduce to stop tobacco use- you can do it! Look for new employment. Increase water intake and follow heart healthy diet. Refrain from alcohol intake. Practice safe sex consistently. We will call you when lab results are available. Increase regular exercise.

## 2019-01-14 NOTE — Patient Instructions (Signed)
Nausea and Vomiting, Adult Nausea is the feeling that you have an upset stomach or that you are about to vomit. Vomiting is when stomach contents are thrown up and out of the mouth as a result of nausea. Vomiting can make you feel weak and cause you to become dehydrated. Dehydration can make you feel tired and thirsty, cause you to have a dry mouth, and decrease how often you urinate. Older adults and people with other diseases or a weak disease-fighting system (immune system) are at higher risk for dehydration. It is important to treat your nausea and vomiting as told by your health care provider. Follow these instructions at home: Watch your symptoms for any changes. Tell your health care provider about them. Follow these instructions to care for yourself at home. Eating and drinking      Take an oral rehydration solution (ORS). This is a drink that is sold at pharmacies and retail stores.  Drink clear fluids slowly and in small amounts as you are able. Clear fluids include water, ice chips, low-calorie sports drinks, and fruit juice that has water added (diluted fruit juice).  Eat bland, easy-to-digest foods in small amounts as you are able. These foods include bananas, applesauce, rice, lean meats, toast, and crackers.  Avoid fluids that contain a lot of sugar or caffeine, such as energy drinks, sports drinks, and soda.  Avoid alcohol.  Avoid spicy or fatty foods. General instructions  Take over-the-counter and prescription medicines only as told by your health care provider.  Drink enough fluid to keep your urine pale yellow.  Wash your hands often using soap and water. If soap and water are not available, use hand sanitizer.  Make sure that all people in your household wash their hands well and often.  Rest at home while you recover.  Watch your condition for any changes.  Breathe slowly and deeply when you feel nauseated.  Keep all follow-up visits as told by your health  care provider. This is important. Contact a health care provider if:  Your symptoms get worse.  You have new symptoms.  You have a fever.  You cannot drink fluids without vomiting.  Your nausea does not go away after 2 days.  You feel light-headed or dizzy.  You have a headache.  You have muscle cramps.  You have a rash.  You have pain while urinating. Get help right away if:  You have pain in your chest, neck, arm, or jaw.  You feel extremely weak or you faint.  You have persistent vomiting.  You have vomit that is bright red or looks like black coffee grounds.  You have bloody or black stools or stools that look like tar.  You have a severe headache, a stiff neck, or both.  You have severe pain, cramping, or bloating in your abdomen.  You have difficulty breathing, or you are breathing very quickly.  Your heart is beating very quickly.  Your skin feels cold and clammy.  You feel confused.  You have signs of dehydration, such as: ? Dark urine, very little urine, or no urine. ? Cracked lips. ? Dry mouth. ? Sunken eyes. ? Sleepiness. ? Weakness. These symptoms may represent a serious problem that is an emergency. Do not wait to see if the symptoms will go away. Get medical help right away. Call your local emergency services (911 in the [place] Do not drive yourself to the hospital. Summary  Nausea is the feeling that you have an upset stomach  or that you are about to vomit. As nausea gets worse, it can lead to vomiting. Vomiting can make you feel weak and cause you to become dehydrated.  Follow instructions from your health care provider about eating and drinking to prevent dehydration.  Take over-the-counter and prescription medicines only as told by your health care provider.  Contact your health care provider if your symptoms get worse, or you have new symptoms.  Keep all follow-up visits as told by your health care provider. This is important. This  information is not intended to replace advice given to you by your health care provider. Make sure you discuss any questions you have with your health care provider. Document Released: 12/15/2005 Document Revised: 05/25/2018 Document Reviewed: 05/25/2018 Elsevier Interactive Patient Education  2019 [employer].   Fatigue If you have fatigue, you feel tired all the time and have a lack of energy or a lack of motivation. Fatigue may make it difficult to start or complete tasks because of exhaustion. In general, occasional or mild fatigue is often a normal response to activity or life. However, long-lasting (chronic) or extreme fatigue may be a symptom of a medical condition. Follow these instructions at home: General instructions  Watch your fatigue for any changes.  Go to bed and get up at the same time every day.  Avoid fatigue by pacing yourself during the day and getting enough sleep at night.  Maintain a healthy weight. Medicines  Take over-the-counter and prescription medicines only as told by your health care provider.  Take a multivitamin, if told by your health care provider.  Do not use herbal or dietary supplements unless they are approved by your health care provider. Activity   Exercise regularly, as told by your health care provider.  Use or practice techniques to help you relax, such as yoga, tai chi, meditation, or massage therapy. Eating and drinking   Avoid heavy meals in the evening.  Eat a well-balanced diet, which includes lean proteins, whole grains, plenty of fruits and vegetables, and low-fat dairy products.  Avoid consuming too much caffeine.  Avoid the use of alcohol.  Drink enough fluid to keep your urine pale yellow. Lifestyle  Change situations that cause you stress. Try to keep your work and personal schedule in balance.  Do not use any products that contain nicotine or tobacco, such as cigarettes and e-cigarettes. If you need help  quitting, ask your health care provider.  Do not use drugs. Contact a health care provider if:  Your fatigue does not get better.  You have a fever.  You suddenly lose or gain weight.  You have headaches.  You have trouble falling asleep or sleeping through the night.  You feel angry, guilty, anxious, or sad.  You are unable to have a bowel movement (constipation).  Your skin is dry.  You have swelling in your legs or another part of your body. Get help right away if:  You feel confused.  Your vision is blurry.  You feel faint or you pass out.  You have a severe headache.  You have severe pain in your abdomen, your back, or the area between your waist and hips (pelvis).  You have chest pain, shortness of breath, or an irregular or fast heartbeat.  You are unable to urinate, or you urinate less than normal.  You have abnormal bleeding, such as bleeding from the rectum, vagina, nose, lungs, or nipples.  You vomit blood.  You have thoughts about hurting yourself  or others. If you ever feel like you may hurt yourself or others, or have thoughts about taking your own life, get help right away. You can go to your nearest emergency department or call:  Your local emergency services (911 in the [place]  A suicide crisis helpline, such as the [place] at (548) 364-6674. This is open 24 hours a day. Summary  If you have fatigue, you feel tired all the time and have a lack of energy or a lack of motivation.  Fatigue may make it difficult to start or complete tasks because of exhaustion.  Long-lasting (chronic) or extreme fatigue may be a symptom of a medical condition.  Exercise regularly, as told by your health care provider.  Change situations that cause you stress. Try to keep your work and personal schedule in balance. This information is not intended to replace advice given to you by your health care provider. Make sure you discuss any  questions you have with your health care provider. Document Released: 10/12/2007 Document Revised: 09/09/2017 Document Reviewed: 09/09/2017 Elsevier Interactive Patient Education  2019 [place] all medications as directed. Reduce to stop tobacco use- you can do it! Look for new employment. Increase water intake and follow heart healthy diet. Refrain from alcohol intake. Practice safe sex consistently. We will call you when lab results are available. Increase regular exercise. FEEL BETTER!

## 2019-01-14 NOTE — Progress Notes (Signed)
Subjective:    Patient ID: Gracy Racerracy Leigh Chay, female    DOB: Nov 21, 1971, 48 y.o.   MRN: [id number]  HPI:  [name] presents with continued non-productive cough (>7616month) and recent onset of N/V/D, chills/body aches the last 2 days. 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 She reports having unprotected sex recently and is concerned about STI exposure She denies current vaginal pain, d/c She denies urinary frequency/dysuria She has not established with psychiatrist, re: time and finances She reports stale mood, denies thoughts of harming herself/others She states "emotional I am great, I just physically I feel like crap"  Patient Care Team    Relationship Specialty Notifications Start End  Julaine Fusianford, Bianney Rockwood D, NP PCP - General Family Medicine  02/15/18   [facility]    02/15/18   Trey SailorsPa, [facility]  Family Medicine  02/15/18   [facility]    02/15/18     Patient Active Problem List   Diagnosis Date Noted  . Flu-like symptoms 01/14/2019  . Bronchitis 12/06/2018  . Right upper quadrant abdominal pain 11/04/2018  . Myalgia 10/28/2018  . Blurred vision, bilateral 10/28/2018  . Abnormal urinalysis 10/11/2018  . Bipolar disorder, unspecified (HCC) 07/30/2018  . Respiratory failure (HCC) 07/28/2018  . Cocaine use disorder, mild, abuse (HCC)   . Bipolar disorder (HCC) 06/18/2018  . Fatigue 03/01/2018  . GAD (generalized anxiety disorder) 03/01/2018  . Gingivitis 03/01/2018  . Healthcare maintenance 02/15/2018  . Depression 02/15/2018  . Bipolar 1 disorder (HCC) 02/15/2018  . Cough in adult 02/15/2018  . Acute bacterial conjunctivitis of right eye 02/15/2018  . Acute encephalopathy   . Drug overdose 12/29/2015      Past Medical History:  Diagnosis Date  . Allergic rhinitis   . Bipolar 1 disorder (HCC)   . Borderline diabetes mellitus   . Colon polyp   . Depression   . Dyspnea on exertion   . Esophageal stricture    scheduled for EGD and dilation 03-18-2016  . GAD (generalized anxiety disorder)   . History of acute respiratory failure    12-29-2015  due to polysubstance overdose--  pt intubated --  resolved   . History of febrile seizure    x1  01/ 2017--  none since  . History of kidney stones    staghorn  . History of suicide attempt    12-29-2015  -- overdose klonopin, xanax, alcohol--  acute respirtory failure and acute encenphalopathy -- both resolved  . IBS (irritable bowel syndrome)   . Irregular menstrual cycle   . Renal calculi    bilateral     Past Surgical History:  Procedure Laterality Date  . COLONOSCOPY    . CYSTOSCOPY WITH STENT PLACEMENT Bilateral 03/10/2016   Procedure: CYSTOSCOPY WITH STENT PLACEMENT;  Surgeon: Malen GauzePatrick L McKenzie, MD;  [facility] [place];  Service: Urology;  Laterality: Bilateral;  . CYSTOSCOPY/RETROGRADE/URETEROSCOPY/STONE EXTRACTION WITH BASKET Bilateral 03/10/2016   Procedure: CYSTOSCOPY/RETROGRADE/URETEROSCOPY/STONE EXTRACTION WITH BASKET;  Surgeon: Malen GauzePatrick L McKenzie, MD;  [facility] [place];  Service: Urology;  Laterality: Bilateral;  . EXTRACORPOREAL SHOCK WAVE LITHOTRIPSY  x2    . PERCUTANEOUS NEPHROSTOLITHOTOMY Right 09-18-2005   staghorn  . TUBAL LIGATION  1999  Family History  Problem Relation Age of Onset  . Cancer Father        pancreas  . Melanoma Father   . Cancer Maternal Grandmother        lung  . COPD Paternal Grandmother   . Melanoma Paternal Grandmother      Social History   Substance and Sexual Activity  Drug Use No     Social History   Substance and Sexual Activity  Alcohol Use Yes   Comment: occ     Social History   Tobacco Use  Smoking Status Current Every  Day Smoker  . Packs/day: 0.50  . Years: 20.00  . Pack years: 10.00  . Types: Cigarettes  Smokeless Tobacco Never Used     Outpatient Encounter Medications as of 01/14/2019  Medication Sig  . hydrOXYzine (ATARAX/VISTARIL) 50 MG tablet Take 1 tablet (50 mg total) by mouth 3 (three) times daily as needed for anxiety.  Marland Kitchen. ibuprofen (ADVIL,MOTRIN) 600 MG tablet Take 1 tablet (600 mg total) by mouth every 8 (eight) hours as needed.  Marland Kitchen. omeprazole (PRILOSEC) 40 MG capsule Take 1 capsule (40 mg total) by mouth daily.  . ondansetron (ZOFRAN ODT) 4 MG disintegrating tablet Take 1 tablet (4 mg total) by mouth every 8 (eight) hours as needed for nausea or vomiting.  Marland Kitchen. PARoxetine (PAXIL) 10 MG tablet Take 1 tablet (10 mg total) by mouth daily.  . traZODone (DESYREL) 100 MG tablet Take 1 tablet (100 mg total) by mouth at bedtime as needed and may repeat dose one time if needed for sleep.  . [DISCONTINUED] ondansetron (ZOFRAN ODT) 4 MG disintegrating tablet Take 1 tablet (4 mg total) by mouth every 8 (eight) hours as needed for nausea or vomiting.  . [DISCONTINUED] PARoxetine (PAXIL) 10 MG tablet Take 1 tablet (10 mg total) by mouth daily.  . benzonatate (TESSALON) 200 MG capsule Take 1 capsule (200 mg total) by mouth 2 (two) times daily as needed for cough.  . [DISCONTINUED] albuterol (PROVENTIL HFA;VENTOLIN HFA) 108 (90 Base) MCG/ACT inhaler Inhale 2 puffs into the lungs every 6 (six) hours as needed for wheezing or shortness of breath.  . [DISCONTINUED] azithromycin (ZITHROMAX) 250 MG tablet 2 tabs day one.  1 tab days two-five  . [DISCONTINUED] benzonatate (TESSALON) 200 MG capsule Take 1 capsule (200 mg total) by mouth 2 (two) times daily as needed for cough.   No facility-administered encounter medications on file as of 01/14/2019.     Allergies: Fentanyl; Percocet [oxycodone-acetaminophen]; and Robaxin [methocarbamol]  Body mass index is 32.03 kg/m.  Blood pressure 104/66, pulse 91, temperature  98 F (36.7 C), temperature source Oral, height 5\' 5"  (1.651 m), weight 192 lb 8 oz (87.3 kg), SpO2 98 %.  Review of Systems  Constitutional: Positive for activity change, chills and fatigue. Negative for appetite change, diaphoresis, fever and unexpected weight change.  HENT: Positive for postnasal drip. Negative for congestion, rhinorrhea, sinus pain, sore throat and trouble swallowing.   Respiratory: Positive for cough. Negative for chest tightness, shortness of breath, wheezing and stridor.   Cardiovascular: Negative for chest pain, palpitations and leg swelling.  Gastrointestinal: Positive for nausea and vomiting. Negative for abdominal distention, abdominal pain, blood in stool, constipation and diarrhea.  Genitourinary: Negative for difficulty urinating, dyspareunia, dysuria, flank pain, frequency, menstrual problem, pelvic pain, urgency, vaginal discharge and vaginal pain.  Musculoskeletal: Positive for arthralgias, back pain and myalgias.  Neurological: Negative for dizziness and headaches.  Hematological: Does not bruise/bleed easily.  Psychiatric/Behavioral:  Positive for sleep disturbance. Negative for agitation, behavioral problems, confusion, decreased concentration, dysphoric mood, hallucinations, self-injury and suicidal ideas. The patient is not nervous/anxious and is not hyperactive.        Insomnia r/t 3rd shift        Objective:   Physical Exam Vitals signs and nursing note reviewed.  Constitutional:      General: She is not in acute distress.    Appearance: She is obese. She is ill-appearing. She is not toxic-appearing or diaphoretic.     Comments: She appears quite fatigued, however this is her baseline  HENT:     Head: Normocephalic and atraumatic.     Right Ear: Tympanic membrane, ear canal and external ear normal. There is no impacted cerumen.     Left Ear: Tympanic membrane, ear canal and external ear normal. There is no impacted cerumen.     Nose: Congestion  and rhinorrhea present.     Mouth/Throat:     Mouth: Mucous membranes are moist.     Pharynx: No oropharyngeal exudate or posterior oropharyngeal erythema.  Eyes:     Extraocular Movements: Extraocular movements intact.     Conjunctiva/sclera: Conjunctivae normal.     Pupils: Pupils are equal, round, and reactive to light.     Comments: bil xanthelasma  Neck:     Musculoskeletal: Normal range of motion.  Cardiovascular:     Rate and Rhythm: Normal rate.     Pulses: Normal pulses.     Heart sounds: Normal heart sounds. No murmur. No friction rub. No gallop.   Pulmonary:     Effort: Pulmonary effort is normal. No respiratory distress.     Breath sounds: Normal breath sounds. No stridor. No wheezing, rhonchi or rales.  Chest:     Chest wall: No tenderness.  Abdominal:     General: Bowel sounds are normal. There is no distension.     Palpations: Abdomen is soft. There is no mass.     Tenderness: There is no abdominal tenderness. There is no right CVA tenderness, left CVA tenderness, guarding or rebound.     Hernia: No hernia is present.  Lymphadenopathy:     Cervical: No cervical adenopathy.  Skin:    Capillary Refill: Capillary refill takes less than 2 seconds.  Neurological:     Mental Status: She is alert and oriented to person, place, and time.  Psychiatric:        Mood and Affect: Mood normal.        Behavior: Behavior normal.        Thought Content: Thought content normal.        Judgment: Judgment normal.       Assessment & Plan:   1. Flu-like symptoms   2. Screening for HIV (human immunodeficiency virus)   3. Screening for STD (sexually transmitted disease)   4. High risk heterosexual behavior   5. Fatigue, unspecified type   6. GAD (generalized anxiety disorder)     Fatigue Continue all medications as directed. Reduce to stop tobacco use- you can do it! Look for new employment. Increase water intake and follow heart healthy diet. Refrain from alcohol  intake. Practice safe sex consistently. We will call you when lab results are available. Increase regular exercise.  Flu-like symptoms Flu Test Negative   GAD (generalized anxiety disorder) Mood Stable Denies thoughts of harming herself/others She refuses to establish with psychiatrist, re: cost Refilled Paroxtine 10mg  QD  Pt was in the office today for 25+ minutes,  I spent >75% of time in face to face counseling of patient's various medical conditions and in coordination of care FOLLOW-UP:  Return if symptoms worsen or fail to improve.

## 2019-01-15 LAB — HIV ANTIBODY (ROUTINE TESTING W REFLEX): HIV Screen 4th Generation wRfx: NONREACTIVE

## 2019-01-17 LAB — POCT INFLUENZA A/B
Influenza A, POC: NEGATIVE
Influenza B, POC: NEGATIVE

## 2019-01-18 LAB — CHLAMYDIA/GONOCOCCUS/TRICHOMONAS, NAA
Chlamydia by NAA: NEGATIVE
Gonococcus by NAA: NEGATIVE
TRICH VAG BY NAA: NEGATIVE

## 2019-02-03 ENCOUNTER — Ambulatory Visit
Admission: EM | Admit: 2019-02-03 | Discharge: 2019-02-03 | Disposition: A | Discharge status: Home / Self Care | Payer: Medicaid Other | Attending: Family Medicine | Admitting: Family Medicine

## 2019-02-03 DIAGNOSIS — R0982 Postnasal drip: Secondary | ICD-10-CM

## 2019-02-03 DIAGNOSIS — T162XXA Foreign body in left ear, initial encounter: Secondary | ICD-10-CM

## 2019-02-03 DIAGNOSIS — R053 Chronic cough: Secondary | ICD-10-CM

## 2019-02-03 DIAGNOSIS — R05 Cough: Secondary | ICD-10-CM

## 2019-02-03 MED ORDER — CETIRIZINE HCL 10 MG PO CHEW: 10.0000 mg | CHEWABLE_TABLET | Freq: Every day | ORAL | 0 refills | Status: DC

## 2019-02-03 MED ORDER — FLUTICASONE PROPIONATE 50 MCG/ACT NA SUSP: 2.0000 | Freq: Every day | NASAL | 0 refills | Status: DC

## 2019-02-03 NOTE — Discharge Instructions (Addendum)
Q-tip removed from left ear Get plenty of rest and push fluids Symptoms most likely secondary to post nasal drainage Zyrtec and flonase prescribed.  Take as directed Continue with tessalon as needed for cough Follow up with PCP for further evaluation and management of chronic cough Return or go to ER if you have any new or worsening symptoms such as fever, chills, fatigue, shortness of breath, wheezing, chest pain, nausea, changes in bowel or bladder habits, etc..Marland Kitchen

## 2019-02-03 NOTE — ED Provider Notes (Signed)
[facility]   [id number] 02/03/19 Arrival Time: 1257  Cc: COUGH and FB in ear  SUBJECTIVE:  Stacie Sanders is a 48 y.o. female hx significant for tobacco use, who presents with persistent cough for the past few months.  Reports exacerbation of symptoms within the last week.  Denies positive sick exposure or precipitating event.  Describes cough as constant and dry.  Symptoms are made worse at night  Reports previous symptoms in the past and diagnosed with bronchitis.  Was treated with prednisone at that time with temporary relief.   Denies fever, chills, fatigue, sinus pain, rhinorrhea, sore throat, SOB, wheezing, chest pain, nausea, changes in bowel or bladder habits.    Pt also mentions q-tip stuck in left ear that occurred this morning.  Tried removing and was unsuccessful.  Complains of associated left ear pain.    ROS: As per HPI.  Past Medical History:  Diagnosis Date  . Allergic rhinitis   . Bipolar 1 disorder (HCC)   . Borderline diabetes mellitus   . Colon polyp   . Depression   . Dyspnea on exertion   . Esophageal stricture    scheduled for EGD and dilation 03-18-2016  . GAD (generalized anxiety disorder)   . History of acute respiratory failure    12-29-2015  due to polysubstance overdose--  pt intubated --  resolved   . History of febrile seizure    x1  01/ 2017--  none since  . History of kidney stones    staghorn  . History of suicide attempt    12-29-2015  -- overdose klonopin, xanax, alcohol--  acute respirtory failure and acute encenphalopathy -- both resolved  . IBS (irritable bowel syndrome)   . Irregular menstrual cycle   . Renal calculi    bilateral   Past Surgical History:  Procedure Laterality Date  . COLONOSCOPY    . CYSTOSCOPY WITH STENT PLACEMENT Bilateral 03/10/2016   Procedure: CYSTOSCOPY WITH STENT PLACEMENT;  Surgeon: Malen GauzePatrick L McKenzie, MD;  [facility];  Service: Urology;  Laterality: Bilateral;  .  CYSTOSCOPY/RETROGRADE/URETEROSCOPY/STONE EXTRACTION WITH BASKET Bilateral 03/10/2016   Procedure: CYSTOSCOPY/RETROGRADE/URETEROSCOPY/STONE EXTRACTION WITH BASKET;  Surgeon: Malen GauzePatrick L McKenzie, MD;  [facility];  Service: Urology;  Laterality: Bilateral;  . EXTRACORPOREAL SHOCK WAVE LITHOTRIPSY  x2    . PERCUTANEOUS NEPHROSTOLITHOTOMY Right 09-18-2005   staghorn  . TUBAL LIGATION  1999   Allergies  Allergen Reactions  . Fentanyl Hives and Itching  . Percocet [Oxycodone-Acetaminophen] Hives and Itching  . Robaxin [Methocarbamol] Rash   No current facility-administered medications on file prior to encounter.    Current Outpatient Medications on File Prior to Encounter  Medication Sig Dispense Refill  . benzonatate (TESSALON) 200 MG capsule Take 1 capsule (200 mg total) by mouth 2 (two) times daily as needed for cough. 20 capsule 0  . hydrOXYzine (ATARAX/VISTARIL) 50 MG tablet Take 1 tablet (50 mg total) by mouth 3 (three) times daily as needed for anxiety. 30 tablet 2  . ibuprofen (ADVIL,MOTRIN) 600 MG tablet Take 1 tablet (600 mg total) by mouth every 8 (eight) hours as needed. 30 tablet 2  . omeprazole (PRILOSEC) 40 MG capsule Take 1 capsule (40 mg total) by mouth daily. 90 capsule 3  . ondansetron (ZOFRAN ODT) 4 MG disintegrating tablet Take 1 tablet (4 mg total) by mouth every 8 (eight) hours as needed for nausea or vomiting. 20 tablet 0  . PARoxetine (PAXIL) 10 MG tablet Take 1  tablet (10 mg total) by mouth daily. 30 tablet 2  . traZODone (DESYREL) 100 MG tablet Take 1 tablet (100 mg total) by mouth at bedtime as needed and may repeat dose one time if needed for sleep. 30 tablet 2    Social History   Socioeconomic History  . Marital status: Married    Spouse name: Not on file  . Number of children: Not on file  . Years of education: Not on file  . Highest education level: Not on file  Occupational History  . Not on file  Social Needs  . Financial resource  strain: Not on file  . Food insecurity:    Worry: Not on file    Inability: Not on file  . Transportation needs:    Medical: Not on file    Non-medical: Not on file  Tobacco Use  . Smoking status: Current Every Day Smoker    Packs/day: 0.50    Years: 20.00    Pack years: 10.00    Types: Cigarettes  . Smokeless tobacco: Never Used  Substance and Sexual Activity  . Alcohol use: Yes    Comment: occ  . Drug use: No  . Sexual activity: Yes    Birth control/protection: Surgical  Lifestyle  . Physical activity:    Days per week: 0 days    Minutes per session: 0 min  . Stress: To some extent  Relationships  . Social connections:    Talks on phone: More than three times a week    Gets together: Never    Attends religious service: Never    Active member of club or organization: No    Attends meetings of clubs or organizations: Never    Relationship status: Married  . Intimate partner violence:    Fear of current or ex partner: No    Emotionally abused: No    Physically abused: No    Forced sexual activity: No  Other Topics Concern  . Not on file  Social History Narrative  . Not on file   Family History  Problem Relation Age of Onset  . Cancer Father        pancreas  . Melanoma Father   . Cancer Maternal Grandmother        lung  . COPD Paternal Grandmother   . Melanoma Paternal Grandmother      OBJECTIVE:  Vitals:   02/03/19 1313  BP: 107/63  Pulse: 78  Resp: 18  Temp: 97.6 F (36.4 C)  TempSrc: Oral  SpO2: 98%     General appearance: Alert, well-appearing; speaking in full sentences without difficulty HEENT:NCAT; Ears: EACs clear, TMs pearly gray; Eyes: PERRL.  EOM grossly intact. Nose: nares patent without rhinorrhea; Throat: tonsils nonerythematous or enlarged, uvula midline  Neck: supple without LAD Lungs: clear to auscultation bilaterally without adventitious breath sounds; normal respiratory effort; mild cough present Heart: regular rate and rhythm.   Radial pulses 2+ symmetrical bilaterally Skin: warm and dry Psychological: alert and cooperative; normal mood and affect  Procedure:  Q-tip removed by Diane RN from left ear.  Pt tolerated procedure without difficulty.    ASSESSMENT & PLAN:  1. Persistent cough for 3 weeks or longer   2. Post-nasal drainage   3. Foreign body of left ear, initial encounter     Meds ordered this encounter  Medications  . cetirizine (ZYRTEC) 10 MG chewable tablet    Sig: Chew 1 tablet (10 mg total) by mouth daily.    Dispense:  20 tablet    Refill:  0    Order Specific Question:   Supervising Provider    Answer:   Eustace Moore [[id number]]  . fluticasone (FLONASE) 50 MCG/ACT nasal spray    Sig: Place 2 sprays into both nostrils daily.    Dispense:  16 g    Refill:  0    Order Specific Question:   Supervising Provider    Answer:   Eustace Moore [[id number]]   Q-tip removed from left ear Get plenty of rest and push fluids Symptoms most likely secondary to post nasal drainage Zyrtec and flonase prescribed.  Take as directed Follow up with PCP for further evaluation and management of chronic cough Return or go to ER if you have any new or worsening symptoms such as fever, chills, fatigue, shortness of breath, wheezing, chest pain, nausea, changes in bowel or bladder habits, etc...  Reviewed expectations re: course of current medical issues. Questions answered. Outlined signs and symptoms indicating need for more acute intervention. Patient verbalized understanding. After Visit Summary given.          Rennis Harding, PA-C 02/03/19 1429

## 2019-02-03 NOTE — ED Triage Notes (Signed)
Pt states has a broken off q-tip in lt ear. Pt also c/o dry cough x89months

## 2019-04-06 ENCOUNTER — Telehealth: Payer: Self-pay

## 2019-04-06 ENCOUNTER — Other Ambulatory Visit: Payer: Self-pay | Admitting: Adult Health

## 2019-04-06 ENCOUNTER — Other Ambulatory Visit: Payer: Self-pay

## 2019-04-06 MED ORDER — PAROXETINE HCL 10 MG PO TABS: 10.0000 mg | ORAL_TABLET | Freq: Every day | ORAL | 2 refills | Status: DC

## 2019-04-06 MED ORDER — FLUCONAZOLE 150 MG PO TABS: 150.0000 mg | ORAL_TABLET | Freq: Once | ORAL | 0 refills | Status: DC

## 2019-04-06 MED ORDER — FLUCONAZOLE 150 MG PO TABS: 150.0000 mg | ORAL_TABLET | Freq: Once | ORAL | 0 refills | Status: AC

## 2019-04-06 MED ORDER — TRAZODONE HCL 100 MG PO TABS: 100.0000 mg | ORAL_TABLET | Freq: Every evening | ORAL | 2 refills | Status: DC | PRN

## 2019-04-06 NOTE — Telephone Encounter (Signed)
Patient requesting refills states that she prefers not to leave her house at this time to est with psych due to current events concerning COVID 19.  Please review and advise. LOV 01/14/2019. MPulliam, CMA/RT(R)

## 2019-04-06 NOTE — Telephone Encounter (Signed)
Patient notified. MPulliam, CMA/RT(R)  

## 2019-04-06 NOTE — Telephone Encounter (Signed)
Patient called and states that she is having white vaginal discharge x 1 wk with increase in the amount.  Patient states that it is not enough to soil her underwear "not coming out" she noticed it after touching inside because something did seem normal.  Patient denies any pain, pain or burning with urination or itching. Patient does state that there is a change in the smell but that it is not an odor. Patient has recently started taking showers instead of baths as she normal does.  Patient has used OTC AZO yeast medication and it has not helped.  Please review and advise. MPulliam, CMA/RT(R)

## 2019-04-06 NOTE — Telephone Encounter (Signed)
Good Morning Melissa, I will send in one dose of Diflucan for suspected yeast infection. Please tell Ms. Keast to call back if sx's do not improve after rx. Thanks! Orpha Bur

## 2019-04-11 ENCOUNTER — Telehealth: Payer: Self-pay | Admitting: Adult Health

## 2019-04-11 NOTE — Progress Notes (Signed)
Subjective:    Patient ID: Stacie Sanders, female    DOB: 05-04-1971, 48 y.o.   MRN: [id number]  HPI:  Ms. Szarka presents with white/thick vaginal d/c that started >1.5 weeks ago She denies abdominal pain/flank pain She denies dysuria/urinary frequency She denies fever/night sweats/N/V/D/constipation She denies hematuria/hematochezia She has been in monogamous relationship for a "few months" and reports "pretty consistent use of condoms" but would like to be checked for STIs She reports "light spotty cycle" last month Of Note- She was treated with one dose of Dilfucan 150mg  04/06/2019- sx's did not resolve She has not established with [facility] She reports stable mood, denies SI/HI She is currently receiving unemployment and has ample time during the day/week to establish with new psychiatrist. Discussed that many [facility] practices are using TeleMedicine- no COVID exposure risk She reports walking as frequently as she can She reports continuing to smoke- now up to pack/day and due to boredom has increased ETOH intake- 3-4 beers or 2-3 glasses wine 2-3 times/week She denies increase in depression and vehemently denies thoughts of harming herself/others Discussed that with her overdose hx and current SSRI use- she should avoid ETOH use  Patient Care Team    Relationship Specialty Notifications Start End  Julaine Fusi, NP PCP - General Family Medicine  02/15/18   Gastroenterology, [facility]    02/15/18   [facility]  Family Medicine  02/15/18   Department, [facility]    02/15/18     Patient Active Problem List   Diagnosis Date Noted  . Vaginal discharge 04/12/2019  . Vaginal odor 04/12/2019  . Flu-like symptoms 01/14/2019  . Bronchitis 12/06/2018  . Right upper quadrant abdominal pain 11/04/2018  . Myalgia 10/28/2018  . Blurred vision, bilateral 10/28/2018  . Abnormal urinalysis 10/11/2018  . Respiratory failure (HCC) 07/28/2018  . Cocaine  use disorder, mild, abuse (HCC)   . Fatigue 03/01/2018  . GAD (generalized anxiety disorder) 03/01/2018  . Gingivitis 03/01/2018  . Healthcare maintenance 02/15/2018  . Depression 02/15/2018  . Bipolar 1 disorder (HCC) 02/15/2018  . Cough in adult 02/15/2018  . Acute bacterial conjunctivitis of right eye 02/15/2018  . Acute encephalopathy   . Drug overdose 12/29/2015     Past Medical History:  Diagnosis Date  . Allergic rhinitis   . Bipolar 1 disorder (HCC)   . Borderline diabetes mellitus   . Colon polyp   . Depression   . Dyspnea on exertion   . Esophageal stricture    scheduled for EGD and dilation 03-18-2016  . GAD (generalized anxiety disorder)   . History of acute respiratory failure    12-29-2015  due to polysubstance overdose--  pt intubated --  resolved   . History of febrile seizure    x1  01/ 2017--  none since  . History of kidney stones    staghorn  . History of suicide attempt    12-29-2015  -- overdose klonopin, xanax, alcohol--  acute respirtory failure and acute encenphalopathy -- both resolved  . IBS (irritable bowel syndrome)   . Irregular menstrual cycle   . Renal calculi    bilateral     Past Surgical History:  Procedure Laterality Date  . COLONOSCOPY    . CYSTOSCOPY WITH STENT PLACEMENT Bilateral 03/10/2016   Procedure: CYSTOSCOPY WITH STENT PLACEMENT;  Surgeon: Malen Gauze, MD;  Location: [facility];  Service: Urology;  Laterality: Bilateral;  . CYSTOSCOPY/RETROGRADE/URETEROSCOPY/STONE EXTRACTION WITH BASKET Bilateral 03/10/2016  Procedure: CYSTOSCOPY/RETROGRADE/URETEROSCOPY/STONE EXTRACTION WITH BASKET;  Surgeon: Malen GauzePatrick L McKenzie, MD;  [facility] ;  Service: Urology;  Laterality: Bilateral;  . EXTRACORPOREAL SHOCK WAVE LITHOTRIPSY  x2    . PERCUTANEOUS NEPHROSTOLITHOTOMY Right 09-18-2005   staghorn  . TUBAL LIGATION  1999     Family History  Problem Relation Age of Onset  . Cancer Father         pancreas  . Melanoma Father   . Cancer Maternal Grandmother        lung  . COPD Paternal Grandmother   . Melanoma Paternal Grandmother      Social History   Substance and Sexual Activity  Drug Use No     Social History   Substance and Sexual Activity  Alcohol Use Yes   Comment: occ     Social History   Tobacco Use  Smoking Status Current Every Day Smoker  . Packs/day: 0.50  . Years: 20.00  . Pack years: 10.00  . Types: Cigarettes  Smokeless Tobacco Never Used     Outpatient Encounter Medications as of 04/12/2019  Medication Sig  . cetirizine (ZYRTEC) 10 MG chewable tablet Chew 1 tablet (10 mg total) by mouth daily.  . hydrOXYzine (ATARAX/VISTARIL) 50 MG tablet Take 1 tablet (50 mg total) by mouth 3 (three) times daily as needed for anxiety.  Marland Kitchen. ibuprofen (ADVIL,MOTRIN) 600 MG tablet Take 1 tablet (600 mg total) by mouth every 8 (eight) hours as needed.  . ondansetron (ZOFRAN ODT) 4 MG disintegrating tablet Take 1 tablet (4 mg total) by mouth every 8 (eight) hours as needed for nausea or vomiting.  Marland Kitchen. PARoxetine (PAXIL) 10 MG tablet Take 1 tablet (10 mg total) by mouth daily.  . traZODone (DESYREL) 100 MG tablet Take 1 tablet (100 mg total) by mouth at bedtime as needed and may repeat dose one time if needed for sleep.  . [DISCONTINUED] benzonatate (TESSALON) 200 MG capsule Take 1 capsule (200 mg total) by mouth 2 (two) times daily as needed for cough.  . [DISCONTINUED] fluticasone (FLONASE) 50 MCG/ACT nasal spray Place 2 sprays into both nostrils daily.  . [DISCONTINUED] omeprazole (PRILOSEC) 40 MG capsule Take 1 capsule (40 mg total) by mouth daily.   No facility-administered encounter medications on file as of 04/12/2019.     Allergies: Fentanyl; Percocet [oxycodone-acetaminophen]; and Robaxin [methocarbamol]  Body mass index is 31.2 kg/m.  Blood pressure 122/73, pulse 77, temperature 98.2 F (36.8 C), temperature source Oral, height 5\' 5"  (1.651 m),  weight 187 lb 8 oz (85 kg), SpO2 97 %.  Review of Systems  Constitutional: Positive for fatigue. Negative for activity change, appetite change, chills, diaphoresis, fever and unexpected weight change.  HENT: Negative for congestion.   Respiratory: Negative for cough, chest tightness, shortness of breath, wheezing and stridor.   Cardiovascular: Negative for chest pain, palpitations and leg swelling.  Gastrointestinal: Negative for abdominal distention, abdominal pain, blood in stool, constipation, diarrhea, nausea and vomiting.  Endocrine: Negative for cold intolerance, heat intolerance, polydipsia, polyphagia and polyuria.  Genitourinary: Positive for vaginal discharge. Negative for decreased urine volume, difficulty urinating, dysuria, flank pain, frequency, genital sores, hematuria, menstrual problem, pelvic pain, urgency, vaginal bleeding and vaginal pain.  Musculoskeletal: Negative for arthralgias, back pain, gait problem, joint swelling, myalgias, neck pain and neck stiffness.  Skin: Negative for color change, pallor, rash and wound.  Neurological: Negative for dizziness and headaches.  Hematological: Does not bruise/bleed easily.  Psychiatric/Behavioral: Negative for agitation, behavioral problems, confusion, decreased concentration,  dysphoric mood, hallucinations, self-injury, sleep disturbance and suicidal ideas. The patient is not nervous/anxious and is not hyperactive.        Objective:   Physical Exam Vitals signs and nursing note reviewed. Exam conducted with a chaperone present.  Constitutional:      General: She is not in acute distress.    Appearance: She is obese. She is not ill-appearing, toxic-appearing or diaphoretic.  HENT:     Head: Normocephalic and atraumatic.  Eyes:     Extraocular Movements: Extraocular movements intact.     Conjunctiva/sclera: Conjunctivae normal.     Pupils: Pupils are equal, round, and reactive to light.  Cardiovascular:     Rate and Rhythm:  Normal rate.     Pulses: Normal pulses.     Heart sounds: Normal heart sounds. No murmur. No friction rub. No gallop.   Pulmonary:     Effort: Pulmonary effort is normal. No respiratory distress.     Breath sounds: Normal breath sounds. No stridor. No wheezing, rhonchi or rales.  Chest:     Chest wall: No tenderness.  Abdominal:     General: Abdomen is protuberant.  Genitourinary:    Pubic Area: No rash.      Vagina: Vaginal discharge present.     Comments: Chaperone present during examination. Genital piercing with with hardware noted at top of labia Musculoskeletal: Normal range of motion.  Skin:    General: Skin is warm and dry.  Neurological:     Mental Status: She is alert and oriented to person, place, and time.  Psychiatric:        Mood and Affect: Mood normal.        Thought Content: Thought content normal.        Judgment: Judgment normal.       Assessment & Plan:   1. Vaginal discharge   2. Vaginal odor   3. GAD (generalized anxiety disorder)   4. Bipolar 1 disorder (HCC)   5. Healthcare maintenance     Healthcare maintenance Continue all medications as directed. Continue to drink plenty of water and follow Mediterranean diet. Reduce to stop tobacco use- YOU CAN DO IT! Keep alcohol intake to minimum - one drink/day. Please establish with Be[facility] ASAP- you have the time now and telemedicine is being used many [employer]. We will call you when vaginal swab results are available.  Regular follow-up in 3 months.  GAD (generalized anxiety disorder) Reports stable mood, denies SI/HI Has not established with BH, however is not working-now on unemployment and has ample time Referral placed and encouraged to establish Continue Paroxtine 10 mg QD, Hydroxyzine  TID PRN  Bipolar 1 disorder (HCC) Referral to [facility] placed Not currently working, has time to establish with new psychiatrist Reports stable mood, denies SI/HI   Vaginal discharge NuSwab  Vaginitis (VG) performed Will call with results    FOLLOW-UP:  Return in about 3 months (around 07/12/2019) for Regular Follow Up.

## 2019-04-11 NOTE — Telephone Encounter (Signed)
Patient called states she was told to call office if (symptoms do not improve or worsen in 4 days) Pt states had a virtual OV on 4/8 /20 but I do not see record of (only an Telephone message).  --Forwarding message to medical assistant to call patient back @ [phone].  --glh

## 2019-04-11 NOTE — Telephone Encounter (Signed)
Pt given appt for 04/12/2019 per William Hamburger for vaginal swab.  [name], CMA

## 2019-04-12 ENCOUNTER — Other Ambulatory Visit: Payer: Self-pay

## 2019-04-12 ENCOUNTER — Ambulatory Visit (INDEPENDENT_AMBULATORY_CARE_PROVIDER_SITE_OTHER): Payer: Self-pay | Admitting: Adult Health

## 2019-04-12 ENCOUNTER — Encounter: Payer: Self-pay | Admitting: Adult Health

## 2019-04-12 VITALS — BP 122/73 | HR 77 | Temp 98.2°F | Ht 65.0 in | Wt 187.5 lb

## 2019-04-12 DIAGNOSIS — Z0000 Encounter for general adult medical examination without abnormal findings: Secondary | ICD-10-CM

## 2019-04-12 DIAGNOSIS — N898 Other specified noninflammatory disorders of vagina: Secondary | ICD-10-CM | POA: Insufficient documentation

## 2019-04-12 DIAGNOSIS — F411 Generalized anxiety disorder: Secondary | ICD-10-CM

## 2019-04-12 DIAGNOSIS — F319 Bipolar disorder, unspecified: Secondary | ICD-10-CM

## 2019-04-12 NOTE — Assessment & Plan Note (Signed)
Reports stable mood, denies SI/HI Has not established with BH, however is not working-now on unemployment and has ample time Referral placed and encouraged to establish Continue Paroxtine 10 mg QD, Hydroxyzine 50mg  TID PRN

## 2019-04-12 NOTE — Assessment & Plan Note (Signed)
NuSwab Vaginitis (VG) performed Will call with results

## 2019-04-12 NOTE — Patient Instructions (Signed)
Mediterranean Diet A Mediterranean diet refers to food and lifestyle choices that are based on the traditions of countries located on the [employer]. This way of eating has been shown to help prevent certain conditions and improve outcomes for people who have chronic diseases, like kidney disease and heart disease. What are tips for following this plan? Lifestyle  Cook and eat meals together with your family, when possible.  Drink enough fluid to keep your urine clear or pale yellow.  Be physically active every day. This includes: ? Aerobic exercise like running or swimming. ? Leisure activities like gardening, walking, or housework.  Get 7-8 hours of sleep each night.  Reading food labels   Check the serving size of packaged foods. For foods such as rice and pasta, the serving size refers to the amount of cooked product, not dry.  Check the total fat in packaged foods. Avoid foods that have saturated fat or trans fats.  Check the ingredients list for added sugars, such as corn syrup. Shopping  At the grocery store, buy most of your food from the areas near the walls of the store. This includes: ? Fresh fruits and vegetables (produce). ? Grains, beans, nuts, and seeds. Some of these may be available in unpackaged forms or large amounts (in bulk). ? Fresh seafood. ? Poultry and eggs. ? Low-fat dairy products.  Buy whole ingredients instead of prepackaged foods.  Buy fresh fruits and vegetables in-season from local farmers markets.  Buy frozen fruits and vegetables in resealable bags.  If you do not have access to quality fresh seafood, buy precooked frozen shrimp or canned fish, such as tuna, salmon, or sardines.  Buy small amounts of raw or cooked vegetables, salads, or olives from the deli or salad bar at your store.  Stock your pantry so you always have certain foods on hand, such as olive oil, canned tuna, canned tomatoes, rice, pasta, and beans. Cooking  Cook  foods with extra-virgin olive oil instead of using butter or other vegetable oils.  Have meat as a side dish, and have vegetables or grains as your main dish. This means having meat in small portions or adding small amounts of meat to foods like pasta or stew.  Use beans or vegetables instead of meat in common dishes like chili or lasagna.  Experiment with different cooking methods. Try roasting or broiling vegetables instead of steaming or sauteing them.  Add frozen vegetables to soups, stews, pasta, or rice.  Add nuts or seeds for added healthy fat at each meal. You can add these to yogurt, salads, or vegetable dishes.  Marinate fish or vegetables using olive oil, lemon juice, garlic, and fresh herbs. Meal planning   Plan to eat 1 vegetarian meal one day each week. Try to work up to 2 vegetarian meals, if possible.  Eat seafood 2 or more times a week.  Have healthy snacks readily available, such as: ? Vegetable sticks with hummus. ? [place] yogurt. ? Fruit and nut trail mix.  Eat balanced meals throughout the week. This includes: ? Fruit: 2-3 servings a day ? Vegetables: 4-5 servings a day ? Low-fat dairy: 2 servings a day ? Fish, poultry, or lean meat: 1 serving a day ? Beans and legumes: 2 or more servings a week ? Nuts and seeds: 1-2 servings a day ? Whole grains: 6-8 servings a day ? Extra-virgin olive oil: 3-4 servings a day  Limit red meat and sweets to only a few servings a month What  are my food choices?  Mediterranean diet ? Recommended ? Grains: Whole-grain pasta. Brown rice. Bulgar wheat. Polenta. Couscous. Whole-wheat bread. Orpah Cobb. ? Vegetables: Artichokes. Beets. Broccoli. Cabbage. Carrots. Eggplant. Green beans. Chard. Kale. Spinach. Onions. Leeks. Peas. Squash. Tomatoes. Peppers. Radishes. ? Fruits: Apples. Apricots. Avocado. Berries. Bananas. Cherries. Dates. Figs. Grapes. Lemons. Melon. Oranges. Peaches. Plums. Pomegranate. ? Meats and other  protein foods: Beans. Almonds. Sunflower seeds. Pine nuts. Peanuts. Cod. Salmon. Scallops. Shrimp. Tuna. Tilapia. Clams. Oysters. Eggs. ? Dairy: Low-fat milk. Cheese. Greek yogurt. ? Beverages: Water. Red wine. Herbal tea. ? Fats and oils: Extra virgin olive oil. Avocado oil. Grape seed oil. ? Sweets and desserts: [place] yogurt with honey. Baked apples. Poached pears. Trail mix. ? Seasoning and other foods: Basil. Cilantro. Coriander. Cumin. Mint. Parsley. Sage. Rosemary. Tarragon. Garlic. Oregano. Thyme. Pepper. Balsalmic vinegar. Tahini. Hummus. Tomato sauce. Olives. Mushrooms. ? Limit these ? Grains: Prepackaged pasta or rice dishes. Prepackaged cereal with added sugar. ? Vegetables: Deep fried potatoes (french fries). ? Fruits: Fruit canned in syrup. ? Meats and other protein foods: Beef. Pork. Lamb. Poultry with skin. Hot dogs. Tomasa Blase. ? Dairy: Ice cream. Sour cream. Whole milk. ? Beverages: Juice. Sugar-sweetened soft drinks. Beer. Liquor and spirits. ? Fats and oils: Butter. Canola oil. Vegetable oil. Beef fat (tallow). Lard. ? Sweets and desserts: Cookies. Cakes. Pies. Candy. ? Seasoning and other foods: Mayonnaise. Premade sauces and marinades. ? The items listed may not be a complete list. Talk with your dietitian about what dietary choices are right for you. Summary  The Mediterranean diet includes both food and lifestyle choices.  Eat a variety of fresh fruits and vegetables, beans, nuts, seeds, and whole grains.  Limit the amount of red meat and sweets that you eat.  This information is not intended to replace advice given to you by your health care provider. Make sure you discuss any questions you have with your health care provider. Document Released: 08/07/2016 Document Revised: 09/09/2016 Document Reviewed: 08/07/2016 Elsevier Interactive Patient Education  2019 [employer].   Continue all medications as directed. Continue to drink plenty of water and follow  Mediterranean diet. Reduce to stop tobacco use- YOU CAN DO IT! Keep alcohol intake to minimum - one drink/day. Please establish with [facility] ASAP- you have the time now and telemedicine is being used many [employer]. We will call you when vaginal swab results are available.  Regular follow-up in 3 months.

## 2019-04-12 NOTE — Assessment & Plan Note (Signed)
Continue all medications as directed. Continue to drink plenty of water and follow Mediterranean diet. Reduce to stop tobacco use- YOU CAN DO IT! Keep alcohol intake to minimum - one drink/day. Please establish with [facility] ASAP- you have the time now and telemedicine is being used many [employer]. We will call you when vaginal swab results are available.  Regular follow-up in 3 months.

## 2019-04-12 NOTE — Assessment & Plan Note (Signed)
Referral to [facility] placed Not currently working, has time to establish with new psychiatrist Reports stable mood, denies SI/HI

## 2019-04-14 ENCOUNTER — Telehealth: Payer: Self-pay | Admitting: Adult Health

## 2019-04-14 ENCOUNTER — Other Ambulatory Visit: Payer: Self-pay

## 2019-04-14 ENCOUNTER — Ambulatory Visit (INDEPENDENT_AMBULATORY_CARE_PROVIDER_SITE_OTHER): Payer: Self-pay | Admitting: Adult Health

## 2019-04-14 DIAGNOSIS — N760 Acute vaginitis: Secondary | ICD-10-CM

## 2019-04-14 DIAGNOSIS — B9689 Other specified bacterial agents as the cause of diseases classified elsewhere: Secondary | ICD-10-CM | POA: Insufficient documentation

## 2019-04-14 MED ORDER — METRONIDAZOLE 500 MG PO TABS: 500.0000 mg | ORAL_TABLET | Freq: Two times a day (BID) | ORAL | 0 refills | Status: DC

## 2019-04-14 NOTE — Telephone Encounter (Signed)
Pt informed.  Pt expressed understanding and is agreeable.  T. Nelson, CMA  

## 2019-04-14 NOTE — Telephone Encounter (Signed)
Patient called this morning wondering if test results from swab had came back and informed her that they have not yet. She wanted me to send this message stating the odor and discharge has decreased some but she still wants to know what to do for the next few days if the results come back while we are out of the office. She is worried about it going untreated until Monday if they come back tomorrow and not today. Please advise and contact at mobile number.

## 2019-04-14 NOTE — Telephone Encounter (Signed)
Good Morning Tonya, I will have my computer with me this weekend and will be looking for the results. I will contact her when I receive them and treat appropriately. It will be a telemedicine appt I will not treat until swab results come back Thanks! Orpha Bur

## 2019-04-14 NOTE — Assessment & Plan Note (Signed)
Assessment and Plan: Metronidazole 500mg  BID x 7 days NO ETOH use while taking Metronidazole and for 3 days after completing course- she verablized understanding/agreement Do not have sexual intercourse while taking Rx Do not over clean genital area Recommend white cotton panties Only use fragrant/dye free soaps  Follow Up Instructions: PRN   I discussed the assessment and treatment plan with the patient. The patient was provided an opportunity to ask questions and all were answered. The patient agreed with the plan and demonstrated an understanding of the instructions.   The patient was advised to call back or seek an in-person evaluation if the symptoms worsen or if the condition fails to improve as anticipated.

## 2019-04-14 NOTE — Telephone Encounter (Signed)
Please advise.  T. Tyeesha Riker, CMA 

## 2019-04-14 NOTE — Progress Notes (Signed)
Virtual Visit via Telephone Note  I connected with Gracy Racer on 04/14/19 at  4:00 PM EDT by telephone and verified that I am speaking with the correct person using two identifiers.   I discussed the limitations, risks, security and privacy concerns of performing an evaluation and management service by telephone and the availability of in person appointments. The staff discussed with the patient that there may be a patient responsible charge related to this service. The patient expressed understanding and agreed to proceed.   History of Present Illness: Ms. Stigers was called to discuss NuSwab results- BV + She continues to experience white/thick vaginal discharge that is malodorous  She denies fever/N/V/D She denies abdominal pain   Observations/Objective: No acute distress noted during telephone conversation  Assessment and Plan: Metronidazole 500mg  BID x 7 days NO ETOH use while taking Metronidazole and for 3 days after completing course- she verablized understanding/agreement Do not have sexual intercourse while taking Rx Do not over clean genital area Recommend white cotton panties Only use fragrant/dye free soaps  Follow Up Instructions: PRN   I discussed the assessment and treatment plan with the patient. The patient was provided an opportunity to ask questions and all were answered. The patient agreed with the plan and demonstrated an understanding of the instructions.   The patient was advised to call back or seek an in-person evaluation if the symptoms worsen or if the condition fails to improve as anticipated.  I provided 8 minutes of non-face-to-face time during this encounter.   Julaine Fusi, NP

## 2019-04-15 LAB — NUSWAB VAGINITIS (VG)
Atopobium vaginae: HIGH Score — AB
BVAB 2: HIGH Score — AB
Candida albicans, NAA: NEGATIVE
Candida glabrata, NAA: NEGATIVE
Megasphaera 1: HIGH Score — AB
Trich vag by NAA: NEGATIVE

## 2019-05-02 ENCOUNTER — Telehealth: Payer: Self-pay | Admitting: Adult Health

## 2019-05-02 NOTE — Telephone Encounter (Signed)
Patient called states she thinks she is having a reaction possibly to a new med--- request a call back @ [phone].  --Forwarding request to medical assistant.  --Fausto Skillern

## 2019-05-02 NOTE — Telephone Encounter (Signed)
Good Afternoon Stacie Sanders, I have called Stacie Sanders twice and calls goes to VM that unable to accept messages. Will attempt to contact pt again tomorrow. Thanks! Orpha Bur
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
## 2019-06-28 ENCOUNTER — Telehealth: Payer: Self-pay | Admitting: Family Medicine

## 2019-06-28 DIAGNOSIS — Z20822 Contact with and (suspected) exposure to covid-19: Secondary | ICD-10-CM

## 2019-06-28 NOTE — Telephone Encounter (Signed)
rec'd referral from Gaye Alken, NP with [facility] for COVID testing.  Phone call to pt.  Scheduled for COVID testing 7/1 @ 12:30 PM @ [facility].  Pt. Advised to wear mask and remain in car for testing.  Verb. Understanding.

## 2019-06-29 ENCOUNTER — Other Ambulatory Visit: Payer: Self-pay

## 2019-06-29 DIAGNOSIS — Z20822 Contact with and (suspected) exposure to covid-19: Secondary | ICD-10-CM

## 2019-07-03 LAB — NOVEL CORONAVIRUS, NAA: SARS-CoV-2, NAA: NOT DETECTED

## 2019-07-19 ENCOUNTER — Other Ambulatory Visit: Payer: Self-pay | Admitting: Adult Health

## 2019-07-21 ENCOUNTER — Emergency Department
Admission: EM | Admit: 2019-07-21 | Discharge: 2019-07-22 | Disposition: A | Discharge status: Home / Self Care | Payer: Commercial Managed Care - PPO | Attending: Emergency Medicine | Admitting: Emergency Medicine

## 2019-07-21 ENCOUNTER — Other Ambulatory Visit: Payer: Self-pay

## 2019-07-21 ENCOUNTER — Telehealth: Payer: Self-pay

## 2019-07-21 DIAGNOSIS — F17210 Nicotine dependence, cigarettes, uncomplicated: Secondary | ICD-10-CM | POA: Insufficient documentation

## 2019-07-21 DIAGNOSIS — Z79899 Other long term (current) drug therapy: Secondary | ICD-10-CM | POA: Insufficient documentation

## 2019-07-21 DIAGNOSIS — K0889 Other specified disorders of teeth and supporting structures: Secondary | ICD-10-CM | POA: Insufficient documentation

## 2019-07-21 MED ORDER — AMOXICILLIN 500 MG PO CAPS: 500.0000 mg | ORAL_CAPSULE | Freq: Three times a day (TID) | ORAL | 0 refills | Status: DC

## 2019-07-21 MED ORDER — KETOROLAC TROMETHAMINE 10 MG PO TABS: 10.0000 mg | ORAL_TABLET | Freq: Four times a day (QID) | ORAL | 0 refills | Status: AC | PRN

## 2019-07-21 MED ORDER — KETOROLAC TROMETHAMINE 30 MG/ML IJ SOLN
30.0000 mg | Freq: Once | INTRAMUSCULAR | Status: AC
  Administered 2019-07-22: 30 mg via INTRAMUSCULAR
  Filled 2019-07-21: qty 1

## 2019-07-21 MED ORDER — ONDANSETRON 4 MG PO TBDP
4.0000 mg | ORAL_TABLET | Freq: Once | ORAL | Status: AC
  Administered 2019-07-22: 4 mg via ORAL
  Filled 2019-07-21: qty 1

## 2019-07-21 MED ORDER — AMOXICILLIN 500 MG PO CAPS
500.0000 mg | ORAL_CAPSULE | Freq: Once | ORAL | Status: AC
  Administered 2019-07-22: 500 mg via ORAL
  Filled 2019-07-21: qty 1

## 2019-07-21 MED ORDER — LIDOCAINE VISCOUS HCL 2 % MT SOLN
15.0000 mL | Freq: Once | OROMUCOSAL | Status: AC
  Administered 2019-07-22: 15 mL via OROMUCOSAL
  Filled 2019-07-21: qty 15

## 2019-07-21 MED ORDER — LIDOCAINE VISCOUS HCL 2 % MT SOLN: 10.0000 mL | OROMUCOSAL | 0 refills | Status: DC | PRN

## 2019-07-21 MED ORDER — HYDROCODONE-ACETAMINOPHEN 5-325 MG PO TABS
1.0000 | ORAL_TABLET | Freq: Once | ORAL | Status: AC
  Administered 2019-07-22: 1 via ORAL
  Filled 2019-07-21: qty 1

## 2019-07-21 NOTE — ED Provider Notes (Signed)
[facility] Emergency Department Provider Note  ____________________________________________  Time seen: Approximately 10:38 PM  I have reviewed the triage vital signs and the nursing notes.   HISTORY  Chief Complaint Dental Pain    HPI Stacie Sanders is a 48 y.o. female that presents to the emergency department for right sided dental pain for one week. Pain is primarily to her bottom and top back molars, worse on the bottom. Patient describes pain as electric shocks into her cheeks. She hasn't been able to eat or drink due to pain. Patient is taking OTC meds at home. No fevers.   Past Medical History:  Diagnosis Date  . Allergic rhinitis   . Bipolar 1 disorder (HCC)   . Borderline diabetes mellitus   . Colon polyp   . Depression   . Dyspnea on exertion   . Esophageal stricture    scheduled for EGD and dilation 03-18-2016  . GAD (generalized anxiety disorder)   . History of acute respiratory failure    12-29-2015  due to polysubstance overdose--  pt intubated --  resolved   . History of febrile seizure    x1  01/ 2017--  none since  . History of kidney stones    staghorn  . History of suicide attempt    12-29-2015  -- overdose klonopin, xanax, alcohol--  acute respirtory failure and acute encenphalopathy -- both resolved  . IBS (irritable bowel syndrome)   . Irregular menstrual cycle   . Renal calculi    bilateral    Patient Active Problem List   Diagnosis Date Noted  . BV (bacterial vaginosis) 04/14/2019  . Vaginal discharge 04/12/2019  . Vaginal odor 04/12/2019  . Flu-like symptoms 01/14/2019  . Bronchitis 12/06/2018  . Right upper quadrant abdominal pain 11/04/2018  . Myalgia 10/28/2018  . Blurred vision, bilateral 10/28/2018  . Abnormal urinalysis 10/11/2018  . Respiratory failure (HCC) 07/28/2018  . Cocaine use disorder, mild, abuse (HCC)   . Fatigue 03/01/2018  . GAD (generalized anxiety disorder) 03/01/2018  . Gingivitis  03/01/2018  . Healthcare maintenance 02/15/2018  . Depression 02/15/2018  . Bipolar 1 disorder (HCC) 02/15/2018  . Cough in adult 02/15/2018  . Acute bacterial conjunctivitis of right eye 02/15/2018  . Acute encephalopathy   . Drug overdose 12/29/2015    Past Surgical History:  Procedure Laterality Date  . COLONOSCOPY    . CYSTOSCOPY WITH STENT PLACEMENT Bilateral 03/10/2016   Procedure: CYSTOSCOPY WITH STENT PLACEMENT;  Surgeon: Malen GauzePatrick L McKenzie, MD;  [facility] [place];  Service: Urology;  Laterality: Bilateral;  . CYSTOSCOPY/RETROGRADE/URETEROSCOPY/STONE EXTRACTION WITH BASKET Bilateral 03/10/2016   Procedure: CYSTOSCOPY/RETROGRADE/URETEROSCOPY/STONE EXTRACTION WITH BASKET;  Surgeon: Malen GauzePatrick L McKenzie, MD;  [facility] [place];  Service: Urology;  Laterality: Bilateral;  . EXTRACORPOREAL SHOCK WAVE LITHOTRIPSY  x2    . PERCUTANEOUS NEPHROSTOLITHOTOMY Right 09-18-2005   staghorn  . TUBAL LIGATION  1999    Prior to Admission medications   Medication Sig Start Date End Date Taking? Authorizing Provider  amoxicillin (AMOXIL) 500 MG capsule Take 1 capsule (500 mg total) by mouth 3 (three) times daily. 07/21/19   Enid DerryWagner, Carlita Whitcomb, PA-C  cetirizine (ZYRTEC) 10 MG chewable tablet Chew 1 tablet (10 mg total) by mouth daily. 02/03/19   Wurst, GrenadaBrittany, PA-C  hydrOXYzine (ATARAX/VISTARIL) 50 MG tablet Take 1 tablet (50 mg total) by mouth 3 (three) times daily as needed for anxiety. 11/04/18   Danford, Orpha BurKaty D, NP  ibuprofen (ADVIL,MOTRIN) 600 MG tablet Take  1 tablet (600 mg total) by mouth every 8 (eight) hours as needed. 08/04/18   Danford, Valetta Fuller D, NP  ketorolac (TORADOL) 10 MG tablet Take 1 tablet (10 mg total) by mouth every 6 (six) hours as needed for up to 3 days. 07/21/19 07/24/19  Laban Emperor, PA-C  lidocaine (XYLOCAINE) 2 % solution Use as directed 10 mLs in the mouth or throat as needed. 07/21/19   Laban Emperor, PA-C  metroNIDAZOLE (FLAGYL) 500 MG tablet  Take 1 tablet (500 mg total) by mouth 2 (two) times daily. 04/14/19   Danford, Valetta Fuller D, NP  ondansetron (ZOFRAN ODT) 4 MG disintegrating tablet Take 1 tablet (4 mg total) by mouth every 8 (eight) hours as needed for nausea or vomiting. 01/14/19   Danford, Valetta Fuller D, NP  PARoxetine (PAXIL) 10 MG tablet Take 1 tablet (10 mg total) by mouth daily. 04/06/19   Danford, Valetta Fuller D, NP  traZODone (DESYREL) 100 MG tablet Take 1 tablet (100 mg total) by mouth at bedtime as needed and may repeat dose one time if needed for sleep. 04/06/19   Danford, Berna Spare, NP    Allergies Fentanyl, Percocet [oxycodone-acetaminophen], and Robaxin [methocarbamol]  Family History  Problem Relation Age of Onset  . Cancer Father        pancreas  . Melanoma Father   . Cancer Maternal Grandmother        lung  . COPD Paternal Grandmother   . Melanoma Paternal Grandmother     Social History Social History   Tobacco Use  . Smoking status: Current Every Day Smoker    Packs/day: 0.50    Years: 20.00    Pack years: 10.00    Types: Cigarettes  . Smokeless tobacco: Never Used  Substance Use Topics  . Alcohol use: Yes    Comment: occ  . Drug use: No     Review of Systems  Constitutional: No fever/chills Cardiovascular: No chest pain. Respiratory: No SOB. Gastrointestinal: No abdominal pain.  No nausea, no vomiting.  Musculoskeletal: Negative for musculoskeletal pain. Skin: Negative for rash, abrasions, lacerations, ecchymosis. Neurological: Positive for headache.    ____________________________________________   PHYSICAL EXAM:  VITAL SIGNS: ED Triage Vitals [07/21/19 1932]  Enc Vitals Group     BP 111/71     Pulse Rate 68     Resp 18     Temp 98.1 F (36.7 C)     Temp Source Oral     SpO2 98 %     Weight 190 lb (86.2 kg)     Height 5\' 5"  (1.651 m)     Head Circumference      Peak Flow      Pain Score 6     Pain Loc      Pain Edu?      Excl. in [place]?      Constitutional: Alert and oriented. Well  appearing and in no acute distress. Eyes: Conjunctivae are normal. PERRL. EOMI. Head: Atraumatic. ENT:      Ears:      Nose: No congestion/rhinnorhea.      Mouth/Throat: Mucous membranes are moist. Multiple silver fillings. Tenderness to palpation to top and bottom right back molar. No swelling.  Neck: No stridor.   Cardiovascular: Normal rate, regular rhythm.  Good peripheral circulation. Respiratory: Normal respiratory effort without tachypnea or retractions. Lungs CTAB. Good air entry to the bases with no decreased or absent breath sounds. Musculoskeletal: Full range of motion to all extremities. No gross deformities appreciated. Neurologic:  Normal speech and language. No gross focal neurologic deficits are appreciated.  Skin:  Skin is warm, dry and intact. No rash noted. Psychiatric: Mood and affect are normal. Speech and behavior are normal. Patient exhibits appropriate insight and judgement.   ____________________________________________   LABS (all labs ordered are listed, but only abnormal results are displayed)  Labs Reviewed - No data to display ____________________________________________  EKG   ____________________________________________  RADIOLOGY  No results found.  ____________________________________________    PROCEDURES  Procedure(s) performed:    Procedures    Medications  ketorolac (TORADOL) 30 MG/ML injection 30 mg (has no administration in time range)  HYDROcodone-acetaminophen (NORCO/VICODIN) 5-325 MG per tablet 1 tablet (has no administration in time range)  ondansetron (ZOFRAN-ODT) disintegrating tablet 4 mg (has no administration in time range)  amoxicillin (AMOXIL) capsule 500 mg (has no administration in time range)  lidocaine (XYLOCAINE) 2 % viscous mouth solution 15 mL (has no administration in time range)     ____________________________________________   INITIAL IMPRESSION / ASSESSMENT AND PLAN / ED COURSE  Pertinent labs &  imaging results that were available during my care of the patient were reviewed by me and considered in my medical decision making (see chart for details).  Review of the [place] CSRS was performed in accordance of the [facility] prior to dispensing any controlled drugs.   Patient presents to the emergency department for evaluation of dental pain for 1 week.  Vital signs and exam are reassuring.  Patient was given Toradol, Vicodin, amoxicillin in the emergency department.  Patient will be discharged home with prescriptions for amoxicillin, Toradol, lidocaine.  Patient is to follow up with dentist as directed.  Dental resources were given.  Patient is given ED precautions to return to the ED for any worsening or new symptoms.  Stacie Sanders was evaluated in Emergency Department on 07/21/2019 for the symptoms described in the history of present illness. She was evaluated in the context of the global COVID-19 pandemic, which necessitated consideration that the patient might be at risk for infection with the SARS-CoV-2 virus that causes COVID-19. Institutional protocols and algorithms that pertain to the evaluation of patients at risk for COVID-19 are in a state of rapid change based on information released by regulatory bodies including the CDC and federal and state organizations. These policies and algorithms were followed during the patient's care in the ED.   ____________________________________________  FINAL CLINICAL IMPRESSION(S) / ED DIAGNOSES  Final diagnoses:  Toothache      NEW MEDICATIONS STARTED DURING THIS VISIT:  ED Discharge Orders         Ordered    amoxicillin (AMOXIL) 500 MG capsule  3 times daily     07/21/19 2323    lidocaine (XYLOCAINE) 2 % solution  As needed     07/21/19 2323    ketorolac (TORADOL) 10 MG tablet  Every 6 hours PRN     07/21/19 2323              This chart was dictated using voice recognition software/Dragon. Despite best efforts to proofread, errors  can occur which can change the meaning. Any change was purely unintentional.    Enid DerryWagner, Viviann Broyles, PA-C 07/22/19 0013    Minna AntisPaduchowski, Kevin, MD 07/22/19 2322

## 2019-07-21 NOTE — Discharge Instructions (Addendum)
Please begin antibiotics for infection.  You can take Toradol for pain and inflammation.  Do not take any additional ibuprofen.  Please make an appointment with dentist within 2 weeks.  OPTIONS FOR DENTAL FOLLOW UP CARE  [email or web address] [phone])  [facility] [phone])  [place] [phone])  [place] [phone])  [facility] [phone]) This clinic caters to the indigent population and is on a lottery system. Location: [employer], [place], [place], [place] Clinic Hours: Wednesdays from 6pm - 9pm, patients seen by a lottery system. For dates, call or go to [email or web address] Services: Cleanings, fillings and simple extractions. Payment Options: DENTAL WORK IS FREE OF CHARGE. Bring proof of income or support. Best way to get seen: Arrive at 5:15 pm - this is a lottery, NOT first come/first serve, so arriving earlier will not increase your chances of being seen.     [facility] [phone] Select option 1 for emergencies   Location: [facility], [place], [address] [place] Clinic Hours: No walk-ins accepted - call the day before to schedule an appointment. Check in times are 9:30 am and 1:30 pm. Services: Simple extractions, temporary fillings, pulpectomy/pulp debridement, uncomplicated abscess drainage. Payment Options: PAYMENT IS DUE AT THE TIME OF SERVICE.  Fee is usually $100-200, additional surgical procedures (e.g. abscess drainage) may be extra. Cash, checks, Visa/MasterCard accepted.  Can file Medicaid if patient is covered for dental - patient should call case worker to check. No discount for [facility] patients. Best way  to get seen: MUST call the day before and get onto the schedule. Can usually be seen the next 1-2 days. No walk-ins accepted.     [place] [phone]   Location: [place], [place] Clinic Hours: M, W, Th, F 8am or 1:30pm, Tues 9a or 1:30 - first come/first served. Services: Simple extractions, temporary fillings, uncomplicated abscess drainage.  You do not need to be an [facility] resident. Payment Options: PAYMENT IS DUE AT THE TIME OF SERVICE. Dental insurance, otherwise sliding scale - bring proof of income or support. Depending on income and treatment needed, cost is usually $50-200. Best way to get seen: Arrive early as it is first come/first served.     [facility] [phone]   Location: [place] [facility] Hours: Mon-Thu 8a-5p Services: Most basic dental services including extractions and fillings. Payment Options: PAYMENT IS DUE AT THE TIME OF SERVICE. Sliding scale, up to 50% off - bring proof if income or support. Medicaid with dental option accepted. Best way to get seen: Call to schedule an appointment, can usually be seen within 2 weeks OR they will try to see walk-ins - show up at [place] or 2p (you may have to wait).     [facility] [place] RESIDENTS ONLY   Location: [facility], [place] [address], [place], [place] [address] Clinic Hours: By appointment only. Monday - Thursday 8am-5pm, Friday 8am-12pm Services: Cleanings, fillings, extractions. Payment Options: PAYMENT IS DUE AT THE TIME OF SERVICE. Cash, Visa or MasterCard. Sliding scale - $30 minimum per service. Best way to get seen: Come in to office, complete packet and make an appointment - need proof of income or support monies for each  household member and proof of [facility] residence. Usually takes about a month to get in.      [facility] [phone]   Location: [address], [place] Clinic Hours: Walk-in Urgent Care Dental Services are offered Monday-Friday mornings only. The numbers of emergencies accepted daily is limited to the number of providers available. Maximum 15 - Mondays, Wednesdays & Thursdays Maximum 10 - Tuesdays & Fridays Services: You do not need to be a [facility] resident to be seen for a dental emergency. Emergencies are defined as pain, swelling, abnormal bleeding, or dental trauma. Walkins will receive x-rays if needed. NOTE: Dental cleaning is not an emergency. Payment Options: PAYMENT IS DUE AT THE TIME OF SERVICE. Minimum co-pay is $40.00 for uninsured patients. Minimum co-pay is $3.00 for Medicaid with dental coverage. Dental Insurance is accepted and must be presented at time of visit. Medicare does not cover dental. Forms of payment: Cash, credit card, checks. Best way to get seen: If not previously registered with the clinic, walk-in dental registration begins at 7:15 am and is on a first come/first serve basis. If previously registered with the clinic, call to make an appointment.     [facility] [place] ONLY   Location: [address] [place], [place] Clinic Hours: Mon-Thu 10a-2p Services: Extractions only! Payment Options: FREE (donations accepted) - bring proof of income or support Best way to get seen: Call and schedule an appointment OR come at 8am on the 1st Monday of every month (except for holidays) when it is first come/first served.     [facility] [phone]   Location: [place], [place] Clinic Hours: Friday mornings Services, Payment Options, Best way to get seen: Call for info

## 2019-07-21 NOTE — ED Notes (Signed)
Pt to the er for toothache pain. Pt has pain to the upper and lower right side of face with chewing and drinking and laying on her right side. Pt reports headache pain from the right side into the head. Pt taking OTC meds at home. Pt is unsure if she has a cavity or not. Last dental visit 1 year ago.

## 2019-07-21 NOTE — Telephone Encounter (Signed)
Pt called c/o toothache and requesting medications.  Pt states that this has been going on x 1 week and she does not have an established dentist.  She states that she has been calling various dental offices and cannot get an urgent appointment.  Advised pt that she would need to see before any medications could be prescribed.  Katy, however, does not have any openings for appointments for the remainder of the day and advised pt to seek treatment at [facility] or the ED.  Pt expressed understanding.  Charyl Bigger, CMA

## 2019-07-21 NOTE — ED Triage Notes (Signed)
Pt to the er for possible toothache. Pt says she has been dealing with this over the last week. Pt says she took meds for sinus infection, ambesol. Pt says she took a goody powder which seems to have helped. Pt reports shooting pain up into her head and pain to her cheek when you apply pressure.

## 2019-07-31 IMAGING — CR DG HAND COMPLETE 3+V*R*
3 series · 3 of 3 positions shown · non-contrast
Comparison: Right hand series of August 20, 2017

CLINICAL DATA: Recheck of right hand injury in the metacarpal
region of the index finger. Generalized pain and soreness.

EXAM:
RIGHT HAND - COMPLETE 3+ VIEW

[hand pa]
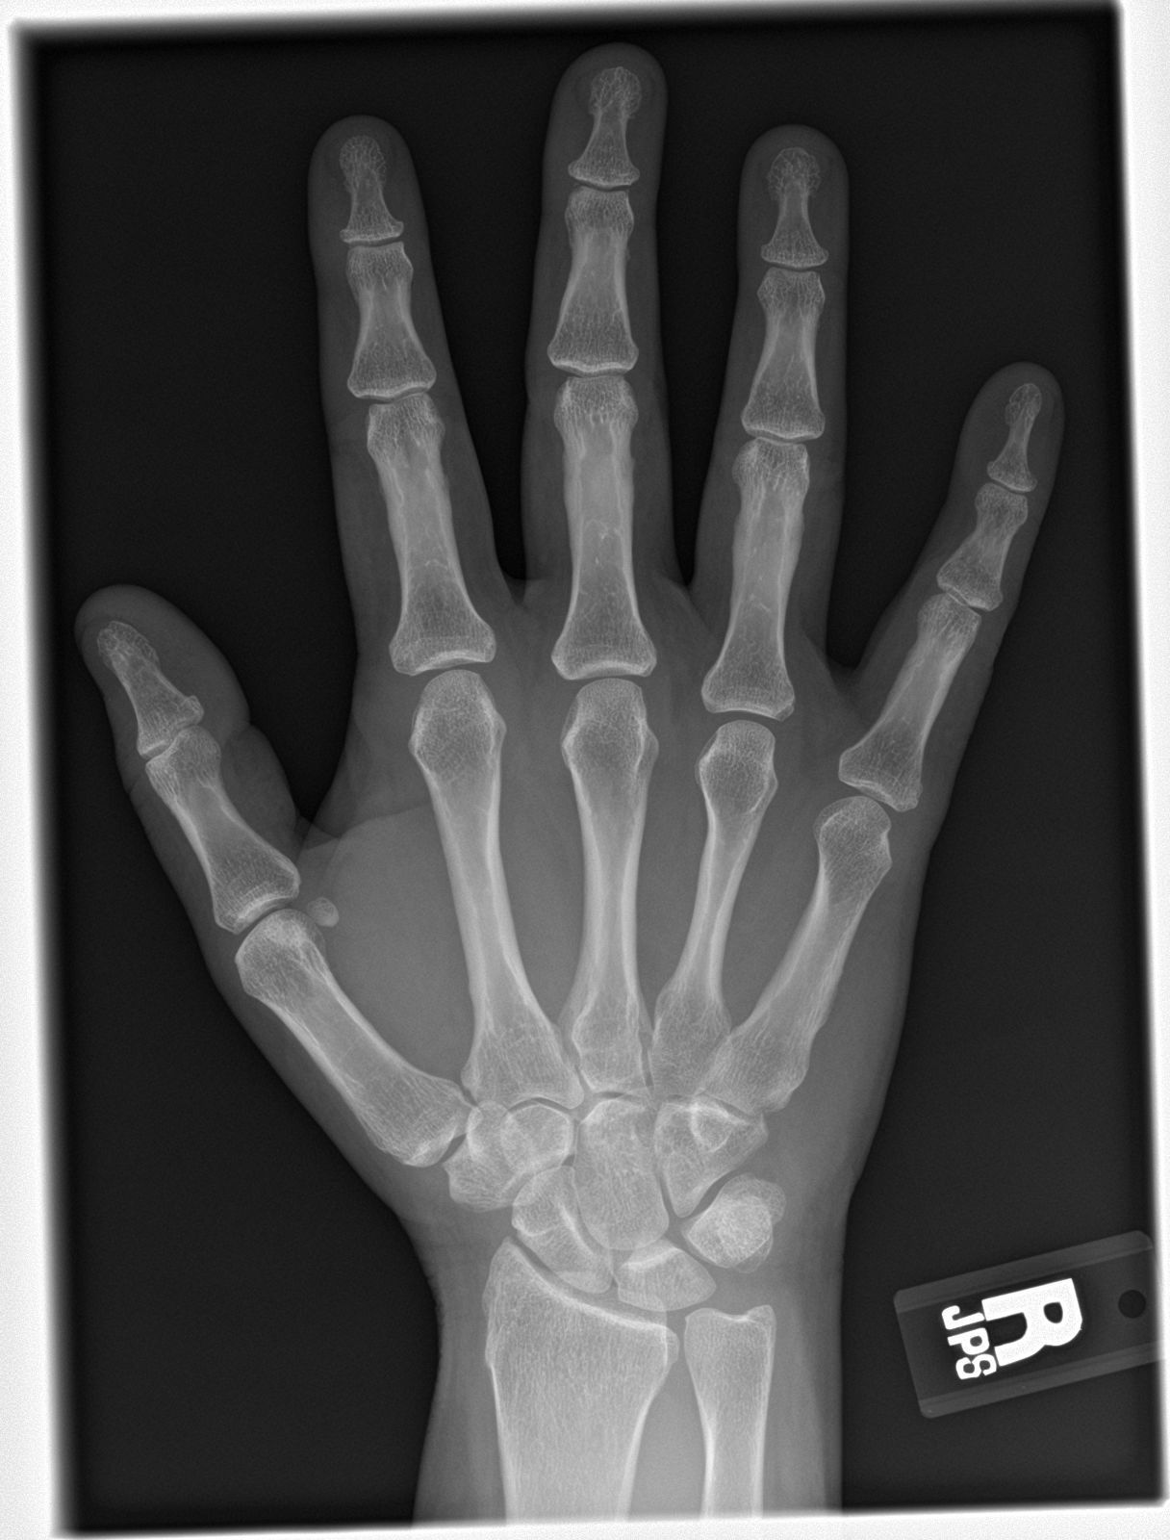

[hand obl]
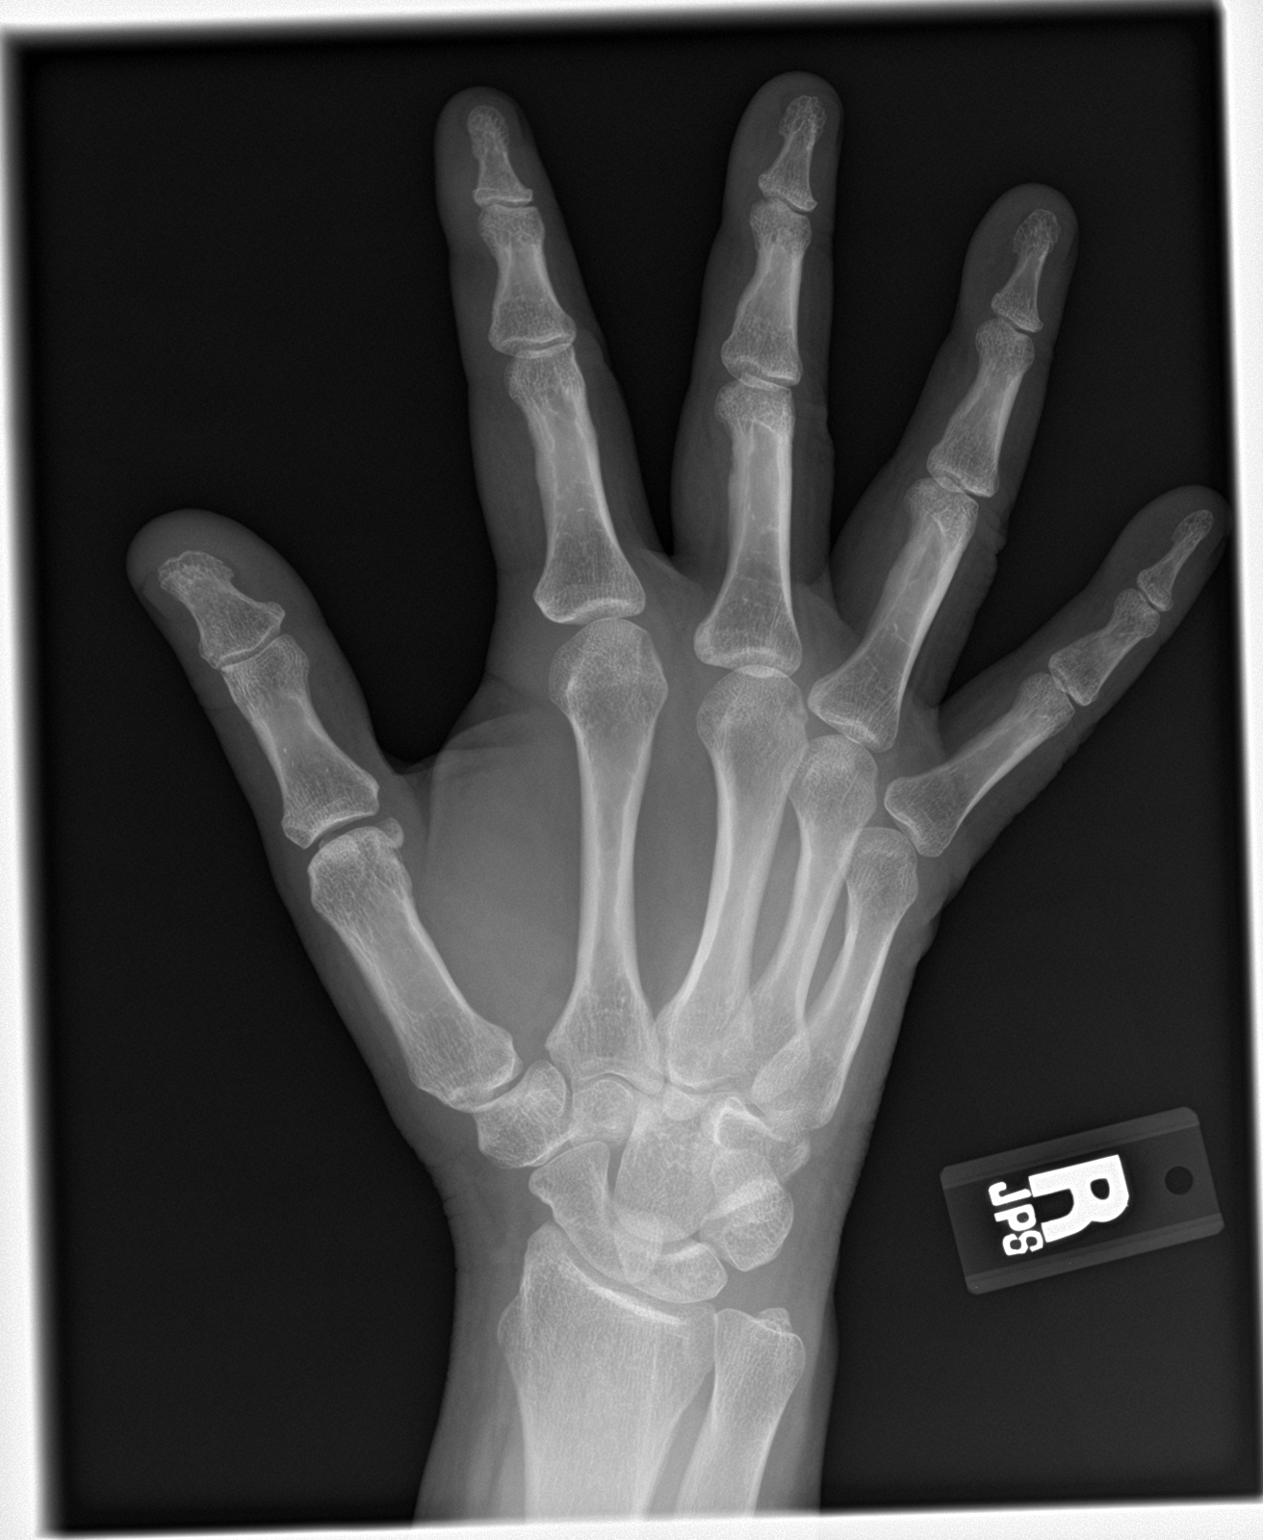

[hand lat]
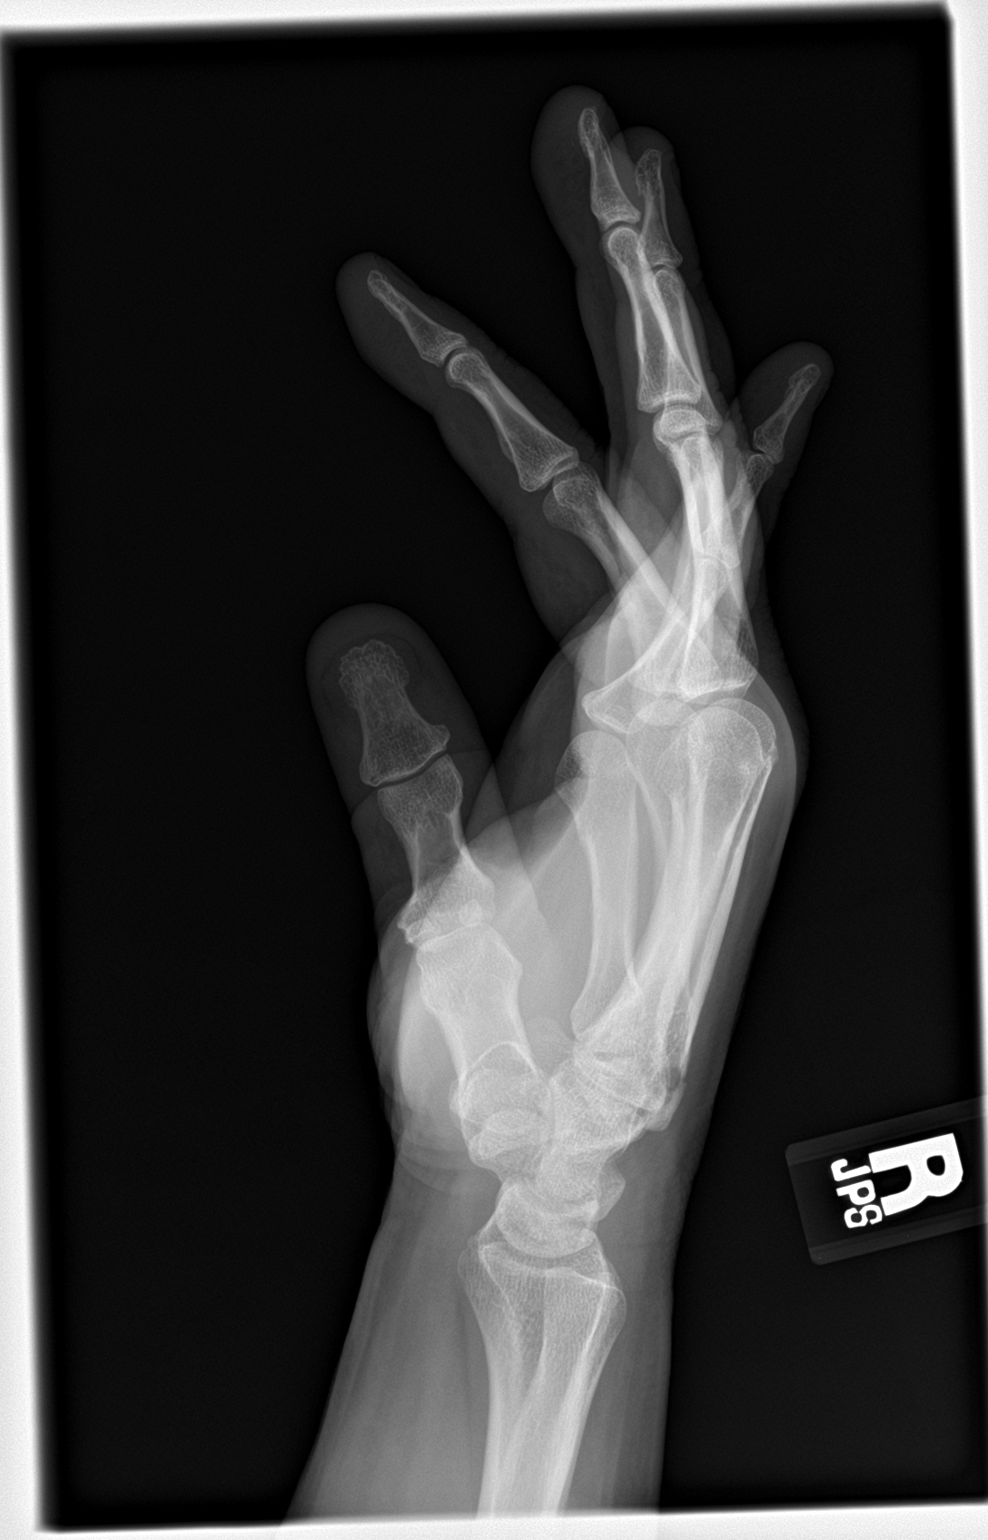

[3 of 3 positions shown; findings below may reference images not displayed]

FINDINGS: Again demonstrated on the lateral view is lucency that projects over
the base of the second or third metacarpal. This is more conspicuous
on today's study. Subtle lucency projects over the base of the third
metacarpal on the oblique view. No definite abnormality is observed
here on the AP view. Elsewhere the metacarpals appear intact. The
phalanges are intact. Specific attention to the index finger reveals
no acute abnormality.
IMPRESSION: Slightly increased conspicuity of lucency over the bases of the
metacarpals on the lateral view which likely involves the base of
the third metacarpal on an oblique view but is not well demonstrated
on this oblique view or on the AP view. Correlation with symptoms
over the metacarpal bases is needed. Given the patient's symptoms
centered over the index finger, it is possible that this lucency is
artifactual or reflects a confluence of normal structures.

Specific attention to the right index finger reveals no acute or
healing fracture nor other bony abnormality.

## 2019-08-01 ENCOUNTER — Telehealth: Payer: Self-pay | Admitting: Adult Health

## 2019-08-01 NOTE — Telephone Encounter (Signed)
Patient called states she is having jaw pain.possibily from tooth,went to ED 10dys ago & was gvn antibiotics but pain is back ask for Appt,but advised pt Stacie Sanders & Stacie Sanders are both out of office this week and that I would relay message to staff to contact her .  --glh

## 2019-08-02 NOTE — Telephone Encounter (Signed)
Pt advised that she really needs to see a dentist for evaluation.  Pt provided with several names and phone numbers of local dentist.  Pt requests appt with Dr Raliegh Scarlet in case she can't get in with a dentist this week.  Pt transferred to front desk to schedule appt.  Charyl Bigger, CMA

## 2019-08-03 ENCOUNTER — Ambulatory Visit (INDEPENDENT_AMBULATORY_CARE_PROVIDER_SITE_OTHER): Payer: Commercial Managed Care - PPO | Admitting: Family Medicine

## 2019-08-03 ENCOUNTER — Encounter: Payer: Self-pay | Admitting: Family Medicine

## 2019-08-03 ENCOUNTER — Other Ambulatory Visit: Payer: Self-pay

## 2019-08-03 VITALS — BP 120/78 | HR 67 | Temp 98.6°F | Ht 65.0 in | Wt 208.0 lb

## 2019-08-03 DIAGNOSIS — M792 Neuralgia and neuritis, unspecified: Secondary | ICD-10-CM

## 2019-08-03 DIAGNOSIS — F458 Other somatoform disorders: Secondary | ICD-10-CM

## 2019-08-03 DIAGNOSIS — Z09 Encounter for follow-up examination after completed treatment for conditions other than malignant neoplasm: Secondary | ICD-10-CM

## 2019-08-03 DIAGNOSIS — K0510 Chronic gingivitis, plaque induced: Secondary | ICD-10-CM | POA: Diagnosis not present

## 2019-08-03 DIAGNOSIS — K089 Disorder of teeth and supporting structures, unspecified: Secondary | ICD-10-CM

## 2019-08-03 DIAGNOSIS — M2669 Other specified disorders of temporomandibular joint: Secondary | ICD-10-CM

## 2019-08-03 MED ORDER — DOXYCYCLINE HYCLATE 100 MG PO TABS: 100.0000 mg | ORAL_TABLET | Freq: Two times a day (BID) | ORAL | 0 refills | Status: DC

## 2019-08-03 MED ORDER — PREGABALIN 75 MG PO CAPS: 75.0000 mg | ORAL_CAPSULE | Freq: Two times a day (BID) | ORAL | 0 refills | Status: DC

## 2019-08-03 NOTE — Patient Instructions (Signed)
Please also get some Neomed sinus rinses and do them twice daily.  After the rinse in the morning you should use Flonase 1 spray each nostril and then in the evenings do the sinus rinse again and again do 1 spray Flonase each nostril after sinus rinse.  In addition, please take an over-the-counter Allegra, or Claritin, or Zyrtec in the like daily. -You will need to follow-up with Katie your primary care provider in 14 days or sooner if problems. -As I mentioned, it is imperative, extremely important, that you follow-up with your dentist in the very near future.  This is something that needs to be addressed by a dentist!   Temporomandibular Joint Syndrome  Temporomandibular joint syndrome (TMJ syndrome) is a condition that causes pain in the temporomandibular joints. These joints are located near your ears and allow your jaw to open and close. For people with TMJ syndrome, chewing, biting, or other movements of the jaw can be difficult or painful. TMJ syndrome is often mild and goes away within a few weeks. However, sometimes the condition becomes a long-term (chronic) problem. What are the causes? This condition may be caused by:  Grinding your teeth or clenching your jaw. Some people do this when they are under stress.  Arthritis.  Injury to the jaw.  Head or neck injury.  Teeth or dentures that are not aligned well. In some cases, the cause of TMJ syndrome may not be known. What are the signs or symptoms? The most common symptom of this condition is an aching pain on the side of the head in the area of the TMJ. Other symptoms may include:  Pain when moving your jaw, such as when chewing or biting.  Being unable to open your jaw all the way.  Making a clicking sound when you open your mouth.  Headache.  Earache.  Neck or shoulder pain. How is this diagnosed? This condition may be diagnosed based on:  Your symptoms and medical history.  A physical exam. Your health care  provider may check the range of motion of your jaw.  Imaging tests, such as X-rays or an MRI. You may also need to see your dentist, who will determine if your teeth and jaw are lined up correctly. How is this treated? TMJ syndrome often goes away on its own. If treatment is needed, the options may include:  Eating soft foods and applying ice or heat.  Medicines to relieve pain or inflammation.  Medicines or massage to relax the muscles.  A splint, bite plate, or mouthpiece to prevent teeth grinding or jaw clenching.  Relaxation techniques or counseling to help reduce stress.  A therapy for pain in which an electrical current is applied to the nerves through the skin (transcutaneous electrical nerve stimulation).  Acupuncture. This is sometimes helpful to relieve pain.  Jaw surgery. This is rarely needed. Follow these instructions at home:  Eating and drinking  Eat a soft diet if you are having trouble chewing.  Avoid foods that require a lot of chewing. Do not chew gum. General instructions  Take over-the-counter and prescription medicines only as told by your health care provider.  If directed, put ice on the painful area. ? Put ice in a plastic bag. ? Place a towel between your skin and the bag. ? Leave the ice on for 20 minutes, 2-3 times a day.  Apply a warm, wet cloth (warm compress) to the painful area as directed.  Massage your jaw area and do any  jaw stretching exercises as told by your health care provider.  If you were given a splint, bite plate, or mouthpiece, wear it as told by your health care provider.  Keep all follow-up visits as told by your health care provider. This is important. Contact a health care provider if:  You are having trouble eating.  You have new or worsening symptoms. Get help right away if:  Your jaw locks open or closed. Summary  Temporomandibular joint syndrome (TMJ syndrome) is a condition that causes pain in the  temporomandibular joints. These joints are located near your ears and allow your jaw to open and close.  TMJ syndrome is often mild and goes away within a few weeks. However, sometimes the condition becomes a long-term (chronic) problem.  Symptoms include an aching pain on the side of the head in the area of the TMJ, pain when chewing or biting, and being unable to open your jaw all the way. You may also make a clicking sound when you open your mouth.  TMJ syndrome often goes away on its own. If treatment is needed, it may include medicines to relieve pain, reduce inflammation, or relax the muscles. A splint, bite plate, or mouthpiece may also be used to prevent teeth grinding or jaw clenching. This information is not intended to replace advice given to you by your health care provider. Make sure you discuss any questions you have with your health care provider. Document Released: 09/09/2001 Document Revised: 02/26/2018 Document Reviewed: 01/26/2018 Elsevier Patient Education  2020 [employer].

## 2019-08-03 NOTE — Progress Notes (Signed)
Pt here for an acute care OV today- its a Hospital f/up OV- pt of Katy Danford's.  Impression and Recommendations:    1. Hospital discharge follow-up   2. TMJ inflammation/ dysfunction   3. Grinding of teeth   4. Gingivitis   5. Poor dentition   6. Neuralgia and neuritis, unspecified     INITIAL IMPRESSION / ASSESSMENT AND PLAN / ED COURSE FROM VISIT 07/21/2019 Patient presents to the emergency department for evaluation of dental pain for 1 week.  Vital signs and exam are reassuring.  Patient was given Toradol, Vicodin, amoxicillin in the emergency department.  Patient will be discharged home with prescriptions for amoxicillin, Toradol, lidocaine.  Patient is to follow up with dentist as directed.  Dental resources were given.  Patient is given ED precautions to return to the ED for any worsening or new symptoms.   TMJ Inflammation, Neuralgia & Neuritis, Gingivitis, Grinding of Teeth - Patient of Katy Danford's. - Per patient, denies known diabetes or issues with blood sugar.  - Advised patient to go to the dentist every six months to help prevent further concerns.  - Second round of different antibiotics prescribed.  See med list.  - Lyrica discussed and prescribed temporarily for nerve pain.  - Advised patient that she needs to follow up with a dentist ASAP. - Extensive education was provided to patient about her CRITICAL need to go to the dentist.   -Environmental and seasonal allergies: Advised the patient to begin using AYR or Neilmed sinus rinses BID followed by flonase BID (one spray to each nostril). Advised that the patient may also incorporate allegra or claritin PRN.  Recommended that patient take a medication like allegra, Claritin, or zyrtec every day.  - She already has a follow up with PCP Mina Marble in two weeks, fourteen days- go for chronic care mgt.  - Will continue to monitor.   Arnold patient to obtain free testing through several  sources as desired. -Recently had negative    Meds ordered this encounter  Medications  . doxycycline (VIBRA-TABS) 100 MG tablet    Sig: Take 1 tablet (100 mg total) by mouth 2 (two) times daily.    Dispense:  20 tablet    Refill:  0  . pregabalin (LYRICA) 75 MG capsule    Sig: Take 1 capsule (75 mg total) by mouth 2 (two) times daily. Needs appt for Refill    Dispense:  60 capsule    Refill:  0    Medications Discontinued During This Encounter  Medication Reason  . amoxicillin (AMOXIL) 500 MG capsule Completed Course  . metroNIDAZOLE (FLAGYL) 500 MG tablet Completed Course      Education and routine counseling performed. Handouts provided  Gross side effects, risk and benefits, and alternatives of medications and treatment plan in general discussed with patient.  Patient is aware that all medications have potential side effects and we are unable to predict every side effect or drug-drug interaction that may occur.   Patient will call with any questions prior to using medication if they have concerns.    Expresses verbal understanding and consents to current therapy and treatment regimen.  No barriers to understanding were identified.  Red flag symptoms and signs discussed in detail.  Patient expressed understanding regarding what to do in case of emergency\urgent symptoms   Please see AVS handed out to patient at the end of our visit for further patient instructions/ counseling done pertaining to  today's office visit.   Return if symptoms worsen or fail to improve, for Follow-up with your P CP in 10-14 days.     Note:  This document was prepared occasionally using Dragon voice recognition software and may include unintentional dictation errors in addition to a scribe.  This document serves as a record of services personally performed by Thomasene Loteborah Odessa Morren, DO. It was created on her behalf by Peggye FothergillKatherine Galloway, a trained medical scribe. The creation of this record is based on the  scribe's personal observations and the provider's statements to them.   I have reviewed the above medical documentation for accuracy and completeness and I concur.  Thomasene Loteborah Denard Tuminello, DO 08/05/2019 2:42 PM       --------------------------------------------------------------------------------------------------------------------------------------------------------------------------------------------------------------------------------------------    Subjective:    CC:  Chief Complaint  Patient presents with  . Jaw Pain    HPI: Stacie Sanders is a 48 y.o. female who [facility] for issues as discussed below.  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  Patient of Katy Danford's.  HPI from ED Visit 07/21/2019 Stacie Sanders is a 48 y.o. female that presents to the emergency department for right sided dental pain for one week. Pain is primarily to her bottom and top back molars, worse on the bottom. Patient describes pain as electric shocks into her cheeks. She hasn't been able to eat or drink due to pain. Patient is taking OTC meds at home. No fevers.  Right-Sided Jaw Pain Feels that the jaw pain is in the right side, and "goes up."  Notes she can't lay on her right side or put any pressure on it.  States hard to eat, hard to drink.  Says "I don't talk, I don't open my mouth, I just watch TV."  Says she can feel the pain in her right side even if she touches the left side of her face.  Patient went to the emergency room 11 days ago and was placed on antibiotics (amoxicillin) for 10 days for "an infection."     Says this "started to improve the situation" and then pain came back.  Says today "I've got some kind of nerve that is shooting up into my head."  -  These med records and all tests and work-up completed on patient was reviewed today by myself.   She has not made a follow-up with her dentist yet although she was told to do so  upon d/c.  States at hospital she was told to also follow up with her "primary physician."  States she "hasn't been able to get in to the dentist."  She does not go to the dentist regularly.  She has had her teeth taken out in the back of her mouth/ gone to dentist in a "long time"..  Denies fever or chills. Denies congestion, fever chills, cough, no SOB more than usual.  Says she has had fatigue more than usual but states is going through family issues.    Patient had a negative coronavirus test a month ago.  ( b/c: at that time she went into work and got cold, and "felt horrible," and had a fever over 100)   Environmental and seasonal allergies: States was taking allergy medication on a regular basis and then stopped taking it.  Says "when this started hurting, I actually bought name brand 'sinus something.' "   Patient is a smoker, she has no desire to quit.    Wt Readings from  Last 3 Encounters:  08/03/19 208 lb (94.3 kg)  07/21/19 190 lb (86.2 kg)  04/12/19 187 lb 8 oz (85 kg)   BP Readings from Last 3 Encounters:  08/03/19 120/78  07/22/19 (!) 142/88  04/12/19 122/73   BMI Readings from Last 3 Encounters:  08/03/19 34.61 kg/m  07/21/19 31.62 kg/m  04/12/19 31.20 kg/m     Patient Care Team    Relationship Specialty Notifications Start End  Julaine Fusianford, Katy D, NP PCP - General Family Medicine  02/15/18   [facility]    02/15/18   Trey SailorsPa, [facility]  Family Medicine  02/15/18   [facility]    02/15/18      Patient Active Problem List   Diagnosis Date Noted  . 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  . Neuralgia and neuritis, unspecified 08/27/2019  . Poor dentition 08/27/2019  . BV (bacterial vaginosis) 04/14/2019  . Vaginal discharge 04/12/2019  . Vaginal odor 04/12/2019  . Flu-like symptoms 01/14/2019  . Bronchitis 12/06/2018  . Right upper quadrant abdominal pain 11/04/2018  . Myalgia 10/28/2018  . Blurred  vision, bilateral 10/28/2018  . Abnormal urinalysis 10/11/2018  . Respiratory failure (HCC) 07/28/2018  . Cocaine use disorder, mild, abuse (HCC)   . Fatigue 03/01/2018  . GAD (generalized anxiety disorder) 03/01/2018  . Gingivitis 03/01/2018  . Grinding of teeth 03/01/2018  . Healthcare maintenance 02/15/2018  . Depression 02/15/2018  . Bipolar 1 disorder (HCC) 02/15/2018  . Cough in adult 02/15/2018  . Acute bacterial conjunctivitis of right eye 02/15/2018  . Acute encephalopathy   . Drug overdose 12/29/2015      Past Medical History:  Diagnosis Date  . Allergic rhinitis   . Bipolar 1 disorder (HCC)   . Borderline diabetes mellitus   . Colon polyp   . Depression   . Dyspnea on exertion   . Esophageal stricture    scheduled for EGD and dilation 03-18-2016  . GAD (generalized anxiety disorder)   . History of acute respiratory failure    12-29-2015  due to polysubstance overdose--  pt intubated --  resolved   . History of febrile seizure    x1  01/ 2017--  none since  . History of kidney stones    staghorn  . History of suicide attempt    12-29-2015  -- overdose klonopin, xanax, alcohol--  acute respirtory failure and acute encenphalopathy -- both resolved  . IBS (irritable bowel syndrome)   . Irregular menstrual cycle   . Renal calculi    bilateral     Past Surgical History:  Procedure Laterality Date  . COLONOSCOPY    . CYSTOSCOPY WITH STENT PLACEMENT Bilateral 03/10/2016   Procedure: CYSTOSCOPY WITH STENT PLACEMENT;  Surgeon: Malen GauzePatrick L McKenzie, MD;  [facility] [place];  Service: Urology;  Laterality: Bilateral;  . CYSTOSCOPY/RETROGRADE/URETEROSCOPY/STONE EXTRACTION WITH BASKET Bilateral 03/10/2016   Procedure: CYSTOSCOPY/RETROGRADE/URETEROSCOPY/STONE EXTRACTION WITH BASKET;  Surgeon: Malen GauzePatrick L McKenzie, MD;  [facility] [place];  Service: Urology;  Laterality: Bilateral;  . EXTRACORPOREAL SHOCK WAVE LITHOTRIPSY  x2    .  PERCUTANEOUS NEPHROSTOLITHOTOMY Right 09-18-2005   staghorn  . TUBAL LIGATION  1999     Family History  Problem Relation Age of Onset  . Cancer Father        pancreas  . Melanoma Father   . Cancer Maternal Grandmother        lung  . COPD Paternal Grandmother   . Melanoma Paternal Grandmother  Social History   Socioeconomic History  . Marital status: Married    Spouse name: Not on file  . Number of children: Not on file  . Years of education: Not on file  . Highest education level: Not on file  Occupational History  . Not on file  Social Needs  . Financial resource strain: Not on file  . Food insecurity    Worry: Not on file    Inability: Not on file  . Transportation needs    Medical: Not on file    Non-medical: Not on file  Tobacco Use  . Smoking status: Current Every Day Smoker    Packs/day: 0.50    Years: 20.00    Pack years: 10.00    Types: Cigarettes  . Smokeless tobacco: Never Used  Substance and Sexual Activity  . Alcohol use: Yes    Comment: occ  . Drug use: No  . Sexual activity: Yes    Birth control/protection: Surgical  Lifestyle  . Physical activity    Days per week: 0 days    Minutes per session: 0 min  . Stress: To some extent  Relationships  . Social Musician on phone: More than three times a week    Gets together: Never    Attends religious service: Never    Active member of club or organization: No    Attends meetings of clubs or organizations: Never    Relationship status: Married  . Intimate partner violence    Fear of current or ex partner: No    Emotionally abused: No    Physically abused: No    Forced sexual activity: No  Other Topics Concern  . Not on file  Social History Narrative  . Not on file     Current Meds  Medication Sig  . cetirizine (ZYRTEC) 10 MG chewable tablet Chew 1 tablet (10 mg total) by mouth daily.  . hydrOXYzine (ATARAX/VISTARIL) 50 MG tablet Take 1 tablet (50 mg total) by mouth 3  (three) times daily as needed for anxiety.  Marland Kitchen ibuprofen (ADVIL,MOTRIN) 600 MG tablet Take 1 tablet (600 mg total) by mouth every 8 (eight) hours as needed.  . lidocaine (XYLOCAINE) 2 % solution Use as directed 10 mLs in the mouth or throat as needed.  . ondansetron (ZOFRAN ODT) 4 MG disintegrating tablet Take 1 tablet (4 mg total) by mouth every 8 (eight) hours as needed for nausea or vomiting.  Marland Kitchen PARoxetine (PAXIL) 10 MG tablet Take 1 tablet (10 mg total) by mouth daily.  . traZODone (DESYREL) 100 MG tablet Take 1 tablet (100 mg total) by mouth at bedtime as needed and may repeat dose one time if needed for sleep.    Allergies:  Allergies  Allergen Reactions  . Fentanyl Hives and Itching  . Percocet [Oxycodone-Acetaminophen] Hives and Itching  . Robaxin [Methocarbamol] Rash     Review of Systems: General:   Denies fever, chills, unexplained weight loss.  Optho/Auditory:   Denies visual changes, blurred vision/LOV Respiratory:   Denies wheeze, DOE more than baseline levels.   Cardiovascular:   Denies chest pain, palpitations, new onset peripheral edema  Gastrointestinal:   Denies nausea, vomiting, diarrhea, abd pain.  Genitourinary: Denies dysuria, freq/ urgency, flank pain or discharge from genitals.  Endocrine:     Denies hot or cold intolerance, polyuria, polydipsia. Musculoskeletal:   Denies unexplained myalgias, joint swelling, unexplained arthralgias, gait problems.  Skin:  Denies new onset rash, suspicious lesions Neurological:  Denies dizziness, unexplained weakness, numbness  Psychiatric/Behavioral:   Denies mood changes, suicidal or homicidal ideations, hallucinations    Objective:   Blood pressure 120/78, pulse 67, temperature 98.6 F (37 C), height 5\' 5"  (1.651 m), weight 208 lb (94.3 kg), SpO2 99 %. Body mass index is 34.61 kg/m. General:  Well Developed, well nourished, appropriate for stated age.  Neuro:  Alert and oriented,  extra-ocular muscles intact   HEENT:  Normocephalic, atraumatic, neck supple Sinuses & Nose:   No tenderness over palpation of the sinuses.  No lymphadenopathy, and nares were WNL bilaterally. Jaw & Mouth:  Patient with subjective pain on opening and closing of her jaw in the right TMJ region.  No erythema or bony tenderness to angle of mandible, along mandible or maxilla or mastoid process.   Ear: The TM's bilaterally, left was WNL and right TM was slightly retracted, but otherwise normal. Oral: Patient with evidence of receding gums and poor dental hygiene, but no discernable abscess, area of erythema or acute findings.   Skin:  no gross rash, warm, pink. Cardiac:  RRR, S1 S2 Respiratory:  ECTA B/L and A/P, Not using accessory muscles, speaking in full sentences- unlabored. Vascular:  Ext warm, no cyanosis apprec.; cap RF less 2 sec. Psych:  No HI/SI, judgement and insight good, Euthymic mood. Full Affect.

## 2019-08-27 DIAGNOSIS — M792 Neuralgia and neuritis, unspecified: Secondary | ICD-10-CM | POA: Insufficient documentation

## 2019-08-27 DIAGNOSIS — M2669 Other specified disorders of temporomandibular joint: Secondary | ICD-10-CM | POA: Insufficient documentation

## 2019-08-27 DIAGNOSIS — K089 Disorder of teeth and supporting structures, unspecified: Secondary | ICD-10-CM | POA: Insufficient documentation

## 2020-04-11 IMAGING — DX DG CHEST 1V PORT
1 series · 1 of 1 positions shown · non-contrast
Comparison: Yesterday

CLINICAL DATA: Acute respiratory failure

EXAM:
PORTABLE CHEST 1 VIEW

[chest ap]
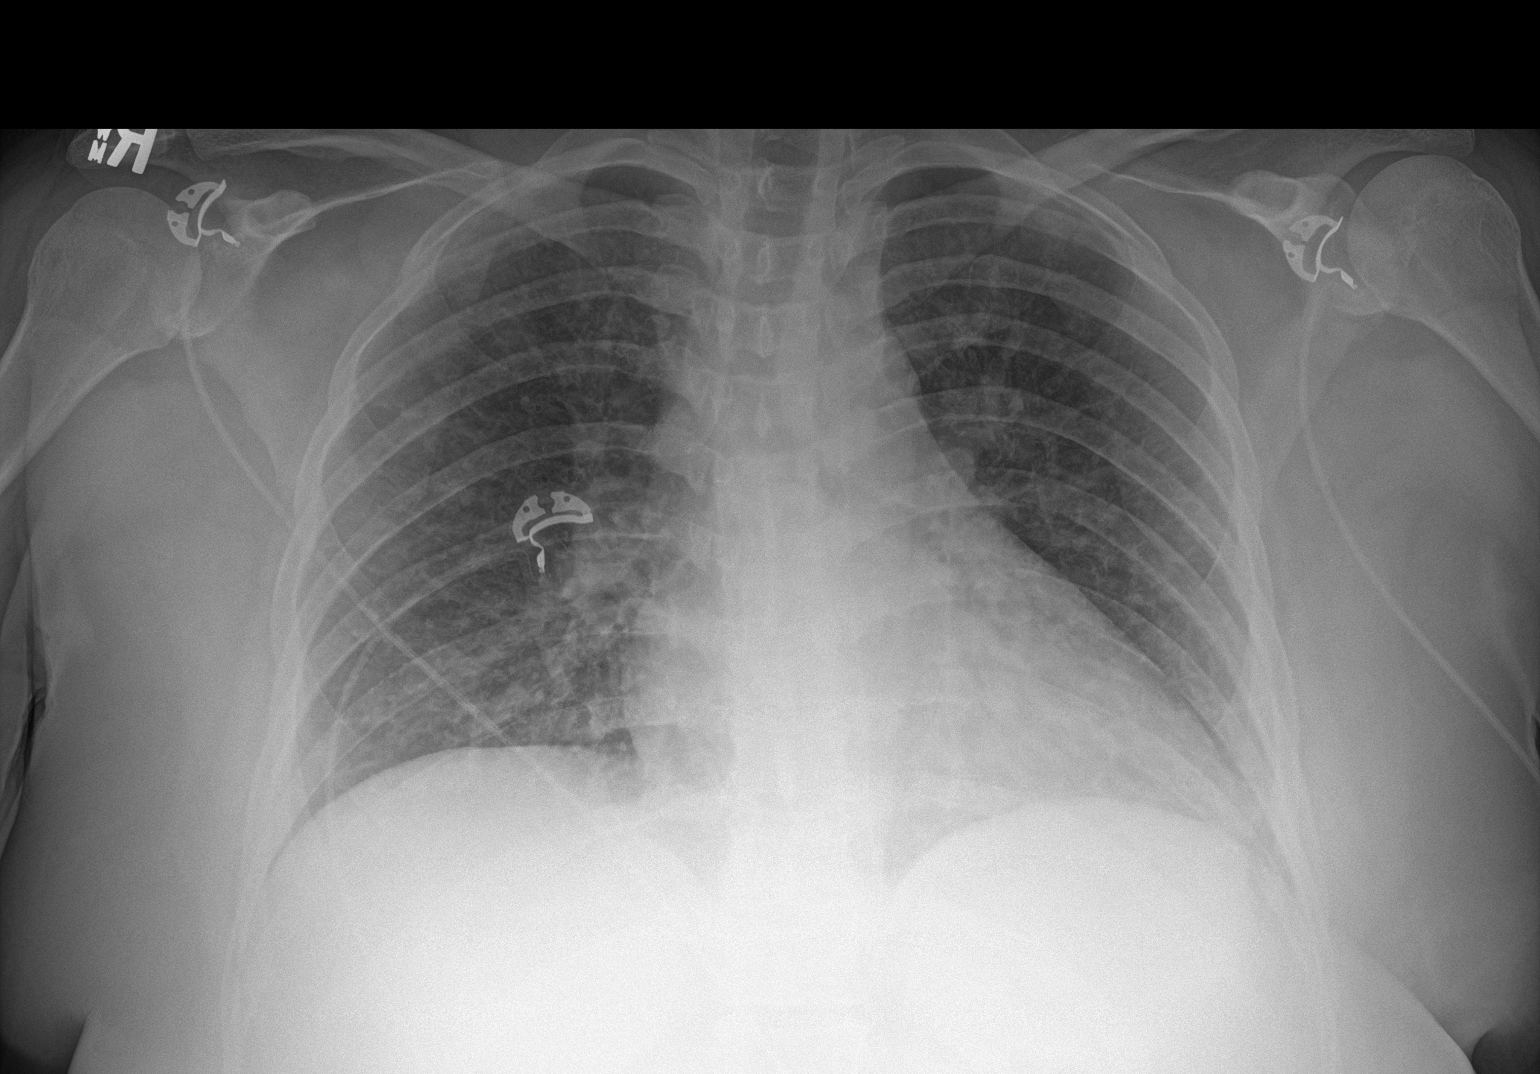

[1 of 1 positions shown; findings below may reference images not displayed]

FINDINGS: Generous heart size accentuated by low volumes. Interstitial and
airspace opacity that are improved from yesterday. No Kerley lines,
effusion, or pneumothorax.
IMPRESSION: Improved aeration, especially at the left lower lobe. Lung volumes
remain low and the interstitium remains coarsened.

## 2021-08-19 ENCOUNTER — Emergency Department (HOSPITAL_COMMUNITY)
Admission: EM | Admit: 2021-08-19 | Discharge: 2021-08-19 | Discharge status: Against Medical Advice | Payer: 59 | Attending: Emergency Medicine | Admitting: Emergency Medicine

## 2021-08-19 ENCOUNTER — Other Ambulatory Visit: Payer: Self-pay

## 2021-08-19 ENCOUNTER — Encounter (HOSPITAL_COMMUNITY): Payer: Self-pay

## 2021-08-19 DIAGNOSIS — N898 Other specified noninflammatory disorders of vagina: Secondary | ICD-10-CM | POA: Insufficient documentation

## 2021-08-19 DIAGNOSIS — T63421A Toxic effect of venom of ants, accidental (unintentional), initial encounter: Secondary | ICD-10-CM | POA: Insufficient documentation

## 2021-08-19 DIAGNOSIS — F17210 Nicotine dependence, cigarettes, uncomplicated: Secondary | ICD-10-CM | POA: Insufficient documentation

## 2021-08-19 DIAGNOSIS — R0602 Shortness of breath: Secondary | ICD-10-CM | POA: Diagnosis not present

## 2021-08-19 DIAGNOSIS — L509 Urticaria, unspecified: Secondary | ICD-10-CM | POA: Diagnosis not present

## 2021-08-19 DIAGNOSIS — T7840XA Allergy, unspecified, initial encounter: Secondary | ICD-10-CM | POA: Diagnosis present

## 2021-08-19 DIAGNOSIS — T782XXA Anaphylactic shock, unspecified, initial encounter: Secondary | ICD-10-CM

## 2021-08-19 LAB — CBC
HCT: 49 % — ABNORMAL HIGH (ref 36.0–46.0)
Hemoglobin: 17 g/dL — ABNORMAL HIGH (ref 12.0–15.0)
MCH: 33 pg (ref 26.0–34.0)
MCHC: 34.7 g/dL (ref 30.0–36.0)
MCV: 95.1 fL (ref 80.0–100.0)
Platelets: 443 10*3/uL — ABNORMAL HIGH (ref 150–400)
RBC: 5.15 MIL/uL — ABNORMAL HIGH (ref 3.87–5.11)
RDW: 12.2 % (ref 11.5–15.5)
WBC: 14.3 10*3/uL — ABNORMAL HIGH (ref 4.0–10.5)
nRBC: 0 % (ref 0.0–0.2)

## 2021-08-19 LAB — I-STAT BETA HCG BLOOD, ED (MC, WL, AP ONLY): I-stat hCG, quantitative: 14 m[IU]/mL — ABNORMAL HIGH (ref <5)

## 2021-08-19 LAB — BASIC METABOLIC PANEL
Anion gap: 11 (ref 5–15)
BUN: 26 mg/dL — ABNORMAL HIGH (ref 6–20)
CO2: 23 mmol/L (ref 22–32)
Calcium: 9 mg/dL (ref 8.9–10.3)
Chloride: 105 mmol/L (ref 98–111)
Creatinine, Ser: 1 mg/dL (ref 0.44–1.00)
GFR, Estimated: >60 mL/min (ref >60)
Glucose, Bld: 134 mg/dL — ABNORMAL HIGH (ref 70–99)
Potassium: 4 mmol/L (ref 3.5–5.1)
Sodium: 139 mmol/L (ref 135–145)

## 2021-08-19 LAB — I-STAT CHEM 8, ED
BUN: 24 mg/dL — ABNORMAL HIGH (ref 6–20)
Calcium, Ion: 1.17 mmol/L (ref 1.15–1.40)
Chloride: 106 mmol/L (ref 98–111)
Creatinine, Ser: 0.9 mg/dL (ref 0.44–1.00)
Glucose, Bld: 134 mg/dL — ABNORMAL HIGH (ref 70–99)
HCT: 50 % — ABNORMAL HIGH (ref 36.0–46.0)
Hemoglobin: 17 g/dL — ABNORMAL HIGH (ref 12.0–15.0)
Potassium: 3.8 mmol/L (ref 3.5–5.1)
Sodium: 139 mmol/L (ref 135–145)
TCO2: 25 mmol/L (ref 22–32)

## 2021-08-19 LAB — HCG, QUANTITATIVE, PREGNANCY: hCG, Beta Chain, Quant, S: 3 m[IU]/mL (ref <5)

## 2021-08-19 MED ORDER — EPINEPHRINE 0.3 MG/0.3ML IJ SOAJ
0.3000 mg | Freq: Once | INTRAMUSCULAR | Status: AC
  Administered 2021-08-19: 0.3 mg via INTRAMUSCULAR
  Filled 2021-08-19: qty 0.3

## 2021-08-19 MED ORDER — DEXAMETHASONE SODIUM PHOSPHATE 10 MG/ML IJ SOLN
10.0000 mg | Freq: Once | INTRAMUSCULAR | Status: DC
Start: 2021-08-19 — End: 2021-08-19
  Filled 2021-08-19: qty 1

## 2021-08-19 MED ORDER — FAMOTIDINE IN NACL 20-0.9 MG/50ML-% IV SOLN
20.0000 mg | Freq: Once | INTRAVENOUS | Status: AC
  Administered 2021-08-19: 20 mg via INTRAVENOUS
  Filled 2021-08-19: qty 50

## 2021-08-19 MED ORDER — DIPHENHYDRAMINE HCL 50 MG/ML IJ SOLN
25.0000 mg | Freq: Once | INTRAMUSCULAR | Status: AC
  Administered 2021-08-19: 25 mg via INTRAVENOUS
  Filled 2021-08-19: qty 1

## 2021-08-19 MED ORDER — ONDANSETRON HCL 4 MG/2ML IJ SOLN
4.0000 mg | Freq: Once | INTRAMUSCULAR | Status: AC
  Administered 2021-08-19: 4 mg via INTRAVENOUS
  Filled 2021-08-19: qty 2

## 2021-08-19 MED ORDER — SODIUM CHLORIDE 0.9 % IV BOLUS
1000.0000 mL | Freq: Once | INTRAVENOUS | Status: AC
Start: 2021-08-19 — End: 2021-08-19
  Administered 2021-08-19: 1000 mL via INTRAVENOUS

## 2021-08-19 NOTE — ED Triage Notes (Signed)
Patient BIB [facility] from home c/o allergic reaction to fire ants. Patient washed off the fire ants. Fire gave 50mg  of benadryl  however patient does not feel she got it all down. EMS gave 0.3mg  IM epi 1:1, 5mg  neubulized albuterol,125mg  IV solumedrol, 4mg  zofran IV. 18g left AC.  Patient has hives all over.  106/64 100-110-HR 96-97%

## 2021-08-19 NOTE — ED Provider Notes (Signed)
[facility]-EMERGENCY DEPT Provider Note   CSN: [id number] Arrival date & time: 08/19/21  1707     History Chief Complaint  Patient presents with   Allergic Reaction   Facial Swelling    Stacie Sanders is a 50 y.o. female that presents to the emerge department today for allergic reaction.  Patient states that she was mowing the lawn this morning, states that she saw some fire ants on her legs and then started having an outbreak of hives and shortness of breath.  Called EMS, fire gave 50 mg of Benadryl however patient states that she does not feel as if she got it all the way down.  EMS gave EpiPen and albuterol..  They did also give Solu-Medrol and Zofran.  On my exam, patient has urticaria throughout body, states that she felt better after she was given EpiPen, however feels as if her throat is closing up again.  States that she does not have a history of allergic reaction to fire ants previously.  Was feeling fine prior to mowing the lawn.  Also admits to persistent nausea, denies any diarrhea.  Currently denies any difficulty breathing, however does state that she feels slightly short of breath.  No chest pain.  HPI     Past Medical History:  Diagnosis Date   Allergic rhinitis    Bipolar 1 disorder (HCC)    Borderline diabetes mellitus    Colon polyp    Depression    Dyspnea on exertion    Esophageal stricture    scheduled for EGD and dilation 03-18-2016   GAD (generalized anxiety disorder)    History of acute respiratory failure    12-29-2015  due to polysubstance overdose--  pt intubated --  resolved    History of febrile seizure    x1  01/ 2017--  none since   History of kidney stones    staghorn   History of suicide attempt    12-29-2015  -- overdose klonopin, xanax, alcohol--  acute respirtory failure and acute encenphalopathy -- both resolved   IBS (irritable bowel syndrome)    Irregular menstrual cycle    Renal calculi    bilateral     Patient Active Problem List   Diagnosis Date Noted   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   Neuralgia and neuritis, unspecified 08/27/2019   Poor dentition 08/27/2019   BV (bacterial vaginosis) 04/14/2019   Vaginal discharge 04/12/2019   Vaginal odor 04/12/2019   Flu-like symptoms 01/14/2019   Bronchitis 12/06/2018   Right upper quadrant abdominal pain 11/04/2018   Myalgia 10/28/2018   Blurred vision, bilateral 10/28/2018   Abnormal urinalysis 10/11/2018   Respiratory failure (HCC) 07/28/2018   Cocaine use disorder, mild, abuse (HCC)    Fatigue 03/01/2018   GAD (generalized anxiety disorder) 03/01/2018   Gingivitis 03/01/2018   Grinding of teeth 03/01/2018   Healthcare maintenance 02/15/2018   Depression 02/15/2018   Bipolar 1 disorder (HCC) 02/15/2018   Cough in adult 02/15/2018   Acute bacterial conjunctivitis of right eye 02/15/2018   Acute encephalopathy    Drug overdose 12/29/2015    Past Surgical History:  Procedure Laterality Date   COLONOSCOPY     CYSTOSCOPY WITH STENT PLACEMENT Bilateral 03/10/2016   Procedure: CYSTOSCOPY WITH STENT PLACEMENT;  Surgeon: Malen Gauze, MD;  Location: [facility];  Service: Urology;  Laterality: Bilateral;   CYSTOSCOPY/RETROGRADE/URETEROSCOPY/STONE EXTRACTION WITH BASKET Bilateral 03/10/2016   Procedure: CYSTOSCOPY/RETROGRADE/URETEROSCOPY/STONE EXTRACTION WITH BASKET;  Surgeon: Malen Gauze, MD;  Location: [facility];  Service: Urology;  Laterality: Bilateral;   EXTRACORPOREAL SHOCK WAVE LITHOTRIPSY  x2     PERCUTANEOUS NEPHROSTOLITHOTOMY Right 09-18-2005   staghorn   TUBAL LIGATION  1999     OB History     Gravida  5   Para      Term      Preterm      AB  2   Living  3      SAB  1   IAB  1   Ectopic      Multiple      Live Births              Family History  Problem Relation Age of Onset   Cancer Father        pancreas   Melanoma Father    Cancer  Maternal Grandmother        lung   COPD Paternal Grandmother    Melanoma Paternal Grandmother     Social History   Tobacco Use   Smoking status: Every Day    Packs/day: 0.50    Years: 20.00    Pack years: 10.00    Types: Cigarettes   Smokeless tobacco: Never  Vaping Use   Vaping Use: Never used  Substance Use Topics   Alcohol use: Yes    Comment: occ   Drug use: No    Home Medications Prior to Admission medications   Medication Sig Start Date End Date Taking? Authorizing Provider  gabapentin (NEURONTIN) 300 MG capsule Take 900 mg by mouth 3 (three) times daily.   Yes [provider]  Multiple Vitamin (MULTIVITAMIN WITH MINERALS) TABS tablet Take 1 tablet by mouth daily.   Yes [provider]  cetirizine (ZYRTEC) 10 MG chewable tablet Chew 1 tablet (10 mg total) by mouth daily. Patient not taking: No sig reported 02/03/19   Wurst, GrenadaBrittany, PA-C  doxycycline (VIBRA-TABS) 100 MG tablet Take 1 tablet (100 mg total) by mouth 2 (two) times daily. Patient not taking: No sig reported 08/03/19   Thomasene Lotpalski, Deborah, DO  hydrOXYzine (ATARAX/VISTARIL) 50 MG tablet Take 1 tablet (50 mg total) by mouth 3 (three) times daily as needed for anxiety. Patient not taking: No sig reported 11/04/18   Danford, Orpha BurKaty D, NP  ibuprofen (ADVIL,MOTRIN) 600 MG tablet Take 1 tablet (600 mg total) by mouth every 8 (eight) hours as needed. Patient not taking: No sig reported 08/04/18   Danford, Orpha BurKaty D, NP  lidocaine (XYLOCAINE) 2 % solution Use as directed 10 mLs in the mouth or throat as needed. Patient not taking: No sig reported 07/21/19   Enid DerryWagner, Ashley, PA-C  ondansetron (ZOFRAN ODT) 4 MG disintegrating tablet Take 1 tablet (4 mg total) by mouth every 8 (eight) hours as needed for nausea or vomiting. Patient not taking: No sig reported 01/14/19   Danford, Orpha BurKaty D, NP  PARoxetine (PAXIL) 10 MG tablet Take 1 tablet (10 mg total) by mouth daily. Patient not taking: No sig reported 04/06/19   William Hamburgeranford,  Katy D, NP  pregabalin (LYRICA) 75 MG capsule Take 1 capsule (75 mg total) by mouth 2 (two) times daily. Needs appt for Refill Patient not taking: No sig reported 08/03/19   Thomasene Lotpalski, Deborah, DO  traZODone (DESYREL) 100 MG tablet Take 1 tablet (100 mg total) by mouth at bedtime as needed and may repeat dose one time if needed for sleep. Patient not taking: No sig reported 04/06/19   Julaine Fusianford, Katy D, NP  Allergies    Other, Ed-apap [acetaminophen], Fentanyl, Percocet [oxycodone-acetaminophen], and Robaxin [methocarbamol]  Review of Systems   Review of Systems  Constitutional:  Negative for chills, diaphoresis, fatigue and fever.  HENT:  Negative for congestion, sore throat and trouble swallowing.   Eyes:  Negative for pain and visual disturbance.  Respiratory:  Positive for shortness of breath. Negative for cough and wheezing.   Cardiovascular:  Negative for chest pain, palpitations and leg swelling.  Gastrointestinal:  Positive for nausea. Negative for abdominal distention, abdominal pain, diarrhea and vomiting.  Genitourinary:  Negative for difficulty urinating.  Musculoskeletal:  Negative for back pain, neck pain and neck stiffness.  Skin:  Positive for rash. Negative for pallor.  Neurological:  Negative for dizziness, speech difficulty, weakness and headaches.  Psychiatric/Behavioral:  Negative for confusion.    Physical Exam Updated Vital Signs BP 131/79   Pulse 91   Temp 98.1 F (36.7 C) (Oral)   Resp 15   Ht 5\' 5"  (1.651 m)   Wt 95 kg   SpO2 100%   BMI 34.85 kg/m   Physical Exam Constitutional:      General: She is not in acute distress.    Appearance: Normal appearance. She is not ill-appearing, toxic-appearing or diaphoretic.     Comments: Patient appears extremely uncomfortable, itching with urticaria diffusely spread throughout body.  Patient speaking to me in full sentences, airway intact.  HENT:     Head: Normocephalic and atraumatic.     Mouth/Throat:      Mouth: Mucous membranes are moist.     Pharynx: Oropharynx is clear.  Eyes:     General: No scleral icterus.    Extraocular Movements: Extraocular movements intact.     Pupils: Pupils are equal, round, and reactive to light.  Cardiovascular:     Rate and Rhythm: Normal rate and regular rhythm.     Pulses: Normal pulses.     Heart sounds: Normal heart sounds.  Pulmonary:     Effort: Pulmonary effort is normal. No respiratory distress.     Breath sounds: Normal breath sounds. No stridor. No wheezing, rhonchi or rales.  Chest:     Chest wall: No tenderness.  Abdominal:     General: Abdomen is flat. There is no distension.     Palpations: Abdomen is soft.     Tenderness: There is no abdominal tenderness. There is no guarding or rebound.  Musculoskeletal:        General: No swelling or tenderness. Normal range of motion.     Cervical back: Normal range of motion and neck supple. No rigidity.     Right lower leg: No edema.     Left lower leg: No edema.  Skin:    General: Skin is warm and dry.     Capillary Refill: Capillary refill takes less than 2 seconds.     Coloration: Skin is not pale.     Comments: Patient with diffuse urticaria, no angioedema.  Neurological:     General: No focal deficit present.     Mental Status: She is alert and oriented to person, place, and time. Mental status is at baseline.  Psychiatric:        Mood and Affect: Mood normal.        Behavior: Behavior normal.    ED Results / Procedures / Treatments   Labs (all labs ordered are listed, but only abnormal results are displayed) Labs Reviewed  CBC - Abnormal; Notable for the following components:  Result Value   WBC 14.3 (*)    RBC 5.15 (*)    Hemoglobin 17.0 (*)    HCT 49.0 (*)    Platelets 443 (*)    All other components within normal limits  BASIC METABOLIC PANEL - Abnormal; Notable for the following components:   Glucose, Bld 134 (*)    BUN 26 (*)    All other components within normal  limits  I-STAT BETA HCG BLOOD, ED ([facility], [facility], [facility] ONLY) - Abnormal; Notable for the following components:   I-stat hCG, quantitative 14.0 (*)    All other components within normal limits  I-STAT CHEM 8, ED - Abnormal; Notable for the following components:   BUN 24 (*)    Glucose, Bld 134 (*)    Hemoglobin 17.0 (*)    HCT 50.0 (*)    All other components within normal limits  HCG, QUANTITATIVE, PREGNANCY    EKG None  Radiology No results found.  Procedures .Critical Care  Date/Time: 08/19/2021 6:52 PM Performed by: Farrel Gordon, PA-C Authorized by: Farrel Gordon, PA-C   Critical care provider statement:    Critical care time (minutes):  45   Critical care was time spent personally by me on the following activities:  Discussions with consultants, evaluation of patient's response to treatment, examination of patient, ordering and performing treatments and interventions, ordering and review of laboratory studies, ordering and review of radiographic studies, pulse oximetry, re-evaluation of patient's condition, obtaining history from patient or surrogate and review of old charts   Medications Ordered in ED Medications  diphenhydrAMINE (BENADRYL) injection 25 mg (25 mg Intravenous Given 08/19/21 1726)  EPINEPHrine (EPI-PEN) injection 0.3 mg (0.3 mg Intramuscular Given 08/19/21 1726)  ondansetron (ZOFRAN) injection 4 mg (4 mg Intravenous Given 08/19/21 1730)  famotidine (PEPCID) IVPB 20 mg premix (0 mg Intravenous Stopped 08/19/21 1806)  sodium chloride 0.9 % bolus 1,000 mL (0 mLs Intravenous Stopped 08/19/21 1829)  EPINEPHrine (EPI-PEN) injection 0.3 mg (0.3 mg Intramuscular Given 08/19/21 1846)    ED Course  I have reviewed the triage vital signs and the nursing notes.  Pertinent labs & imaging results that were available during my care of the patient were reviewed by me and considered in my medical decision making (see chart for details).    MDM Rules/Calculators/A&P                            Patient presents emerged department today for allergic reaction.  Patient is hemodynamically stable, airway intact, however patient states that she feels as if her airway is closing and feels slightly short of breath.  Will give epi here today and reassess.  No overt signs of airway compromise, blood pressure 131/79.  Also give IV Benadryl, Zofran and Pepcid at this time.  Patient has already received Solu-Medrol.  Upon reevaluation, patient states that she feels much better after epi administration.  Hives have been clearing upon reevaluation 30 minutes later.  Patient calling in again stating that her throat feels as if it is closing again, upon reevaluation again patient does not have any overt signs of throat closure.  Hemodynamically stable, my attending Dr. Elnoria Howard did also assess, do not think we need second EpiPen at this time.  We will continue to reassess.  Upon reevaluation 5 minutes later, patient calling out to nursing staff that she wants to leave.  States that she feels much better even though she called about 5 minutes prior  stating that her throat was closing.  Patient will leave AGAINST MEDICAL ADVICE, did discuss risks of her leaving without observation including cardiac arrhythmias, anaphylaxis.  Patient aware, is able to make medical decisions at this time.  Will give EpiPen to patient to take home.  I discussed this case with my attending physician who cosigned this note including patient's presenting symptoms, physical exam, and planned diagnostics and interventions. Attending physician stated agreement with plan or made changes to plan which were implemented.   Attending physician assessed patient at bedside.   Final Clinical Impression(s) / ED Diagnoses Final diagnoses:  Anaphylaxis, initial encounter    Rx / DC Orders ED Discharge Orders     None        Farrel Gordon, PA-C 08/19/21 1856    Cheryll Cockayne, MD 08/20/21 1515

## 2021-08-19 NOTE — Discharge Instructions (Addendum)
TakeYou are leaving AGAINST MEDICAL ADVICE.  As we discussed if you start to have throat closing sensation again, difficulty breathing please take the EpiPen as we discussed.  Please use the attached directions on how to take the EpiPen.  Benadryl and Pepcid as we discussed, you can take  Take Benadryl as prescribed on the bottle and take Pepc as prescribed on the bottle for the next couple of days.  Please come back to the emergency department if you develop signs of respiratory problems.

## 2021-08-19 NOTE — ED Notes (Signed)
Patient requesting to leave. Stating she feels much better and would like to go home.  Shalyn, PA made aware and patient is leaving AMA. Patient given epi pen to take home per [place], [place].

## 2021-08-28 ENCOUNTER — Encounter: Payer: Self-pay | Admitting: Nurse Practitioner

## 2021-08-28 ENCOUNTER — Other Ambulatory Visit: Payer: Self-pay

## 2021-08-28 ENCOUNTER — Ambulatory Visit (INDEPENDENT_AMBULATORY_CARE_PROVIDER_SITE_OTHER): Payer: Self-pay | Admitting: Nurse Practitioner

## 2021-08-28 VITALS — BP 104/65 | HR 82 | Temp 98.3°F | Ht 65.0 in | Wt 171.8 lb

## 2021-08-28 DIAGNOSIS — R7989 Other specified abnormal findings of blood chemistry: Secondary | ICD-10-CM

## 2021-08-28 DIAGNOSIS — F4310 Post-traumatic stress disorder, unspecified: Secondary | ICD-10-CM

## 2021-08-28 DIAGNOSIS — F5105 Insomnia due to other mental disorder: Secondary | ICD-10-CM

## 2021-08-28 DIAGNOSIS — T782XXD Anaphylactic shock, unspecified, subsequent encounter: Secondary | ICD-10-CM

## 2021-08-28 DIAGNOSIS — F409 Phobic anxiety disorder, unspecified: Secondary | ICD-10-CM

## 2021-08-28 DIAGNOSIS — R1900 Intra-abdominal and pelvic swelling, mass and lump, unspecified site: Secondary | ICD-10-CM

## 2021-08-28 DIAGNOSIS — R5383 Other fatigue: Secondary | ICD-10-CM

## 2021-08-28 DIAGNOSIS — E559 Vitamin D deficiency, unspecified: Secondary | ICD-10-CM

## 2021-08-28 DIAGNOSIS — N951 Menopausal and female climacteric states: Secondary | ICD-10-CM

## 2021-08-28 DIAGNOSIS — E349 Endocrine disorder, unspecified: Secondary | ICD-10-CM

## 2021-08-28 DIAGNOSIS — F411 Generalized anxiety disorder: Secondary | ICD-10-CM

## 2021-08-28 MED ORDER — TRAZODONE HCL 100 MG PO TABS: 100.0000 mg | ORAL_TABLET | Freq: Every evening | ORAL | 0 refills | Status: DC | PRN

## 2021-08-28 MED ORDER — EPINEPHRINE 0.3 MG/0.3ML IJ SOAJ: 0.3000 mg | INTRAMUSCULAR | 1 refills | Status: DC | PRN

## 2021-08-28 NOTE — Progress Notes (Signed)
Established Patient Office Visit  Subjective:  Patient ID: Stacie Sanders, female    DOB: [birth year]  Age: 50 y.o. MRN: [id number]  CC:  Chief Complaint  Patient presents with   Bloated     HPI Stacie Sanders presents for follow-up visit.  She states that she has not had a menstrual cycle in a few years.  She states that she has been having nausea.  Abdominal swelling, feet swelling, weight gain and frequent urination.  She has taken home pregnancy test based on her symptoms.  They have been positive.  She states that in addition to having not had a menstrual period and a few years she had a tubal ligation 1999.  She states last time she had intercourse was 3 weeks ago.  She did take her last pregnancy test about a month ago. She states that she has recently developed an allergy to fire ants.  On 08/19/2021, she was mowing the lawn.  She stepped into a nest of fire ants.  She broke out into hives and has some shortness of breath.  She had a call 911.  They gave her EpiPen, Solu-Medrol, and Zofran.  Patient was also treated with IV steroids in the emergency department and released home.  The patient would like to have a new prescription for EpiPen in case there is exposed to fire ants again. The patient states she is also having a lot of trouble sleeping.  She does have a long history of anxiety, depression, bipolar disorder, and PTSD.  She does have a small dog who serves as a therapy down for her.  The patient states that she does need a note for her landlord to state that her dog, a Chaweeny, is used to provide comfort and relief from anxiety and PTSD.  Patient would also like to be referred to psychiatry for continued management and surveillance.  Past Medical History:  Diagnosis Date   Allergic rhinitis    Bipolar 1 disorder ([place])    Borderline diabetes mellitus    Colon polyp    Depression    Dyspnea on exertion    Esophageal stricture    scheduled for EGD and dilation  03-18-2016   GAD (generalized anxiety disorder)    History of acute respiratory failure    12-29-2015  due to polysubstance overdose--  pt intubated --  resolved    History of febrile seizure    x1  01/ 2017--  none since   History of kidney stones    staghorn   History of suicide attempt    12-29-2015  -- overdose klonopin, xanax, alcohol--  acute respirtory failure and acute encenphalopathy -- both resolved   IBS (irritable bowel syndrome)    Irregular menstrual cycle    Renal calculi    bilateral    Past Surgical History:  Procedure Laterality Date   COLONOSCOPY     CYSTOSCOPY WITH STENT PLACEMENT Bilateral 03/10/2016   Procedure: CYSTOSCOPY WITH STENT PLACEMENT;  Surgeon: Cleon Gustin, MD;  Location: [facility];  Service: Urology;  Laterality: Bilateral;   CYSTOSCOPY/RETROGRADE/URETEROSCOPY/STONE EXTRACTION WITH BASKET Bilateral 03/10/2016   Procedure: CYSTOSCOPY/RETROGRADE/URETEROSCOPY/STONE EXTRACTION WITH BASKET;  Surgeon: Cleon Gustin, MD;  Location: [facility];  Service: Urology;  Laterality: Bilateral;   EXTRACORPOREAL SHOCK WAVE LITHOTRIPSY  x2     PERCUTANEOUS NEPHROSTOLITHOTOMY Right 09-18-2005   staghorn   TUBAL LIGATION  1999    Family History  Problem Relation Age of Onset  Cancer Father        pancreas   Melanoma Father    Cancer Maternal Grandmother        lung   COPD Paternal Grandmother    Melanoma Paternal Grandmother     Social History   Socioeconomic History   Marital status: Married    Spouse name: Not on file   Number of children: Not on file   Years of education: Not on file   Highest education level: Not on file  Occupational History   Not on file  Tobacco Use   Smoking status: Every Day    Packs/day: 0.50    Years: 20.00    Pack years: 10.00    Types: Cigarettes   Smokeless tobacco: Never  Vaping Use   Vaping Use: Never used  Substance and Sexual Activity   Alcohol use: Yes    Comment:  occ   Drug use: No   Sexual activity: Yes    Birth control/protection: Surgical  Other Topics Concern   Not on file  Social History Narrative   Not on file   Social Determinants of Health   Financial Resource Strain: Not on file  Food Insecurity: Not on file  Transportation Needs: Not on file  Physical Activity: Not on file  Stress: Not on file  Social Connections: Not on file  Intimate Partner Violence: Not on file    Outpatient Medications Prior to Visit  Medication Sig Dispense Refill   gabapentin (NEURONTIN) 300 MG capsule Take 900 mg by mouth 3 (three) times daily.     Multiple Vitamin (MULTIVITAMIN WITH MINERALS) TABS tablet Take 1 tablet by mouth daily.     cetirizine (ZYRTEC) 10 MG chewable tablet Chew 1 tablet (10 mg total) by mouth daily. (Patient not taking: No sig reported) 20 tablet 0   doxycycline (VIBRA-TABS) 100 MG tablet Take 1 tablet (100 mg total) by mouth 2 (two) times daily. (Patient not taking: No sig reported) 20 tablet 0   hydrOXYzine (ATARAX/VISTARIL) 50 MG tablet Take 1 tablet (50 mg total) by mouth 3 (three) times daily as needed for anxiety. (Patient not taking: No sig reported) 30 tablet 2   ibuprofen (ADVIL,MOTRIN) 600 MG tablet Take 1 tablet (600 mg total) by mouth every 8 (eight) hours as needed. (Patient not taking: No sig reported) 30 tablet 2   lidocaine (XYLOCAINE) 2 % solution Use as directed 10 mLs in the mouth or throat as needed. (Patient not taking: No sig reported) 100 mL 0   ondansetron (ZOFRAN ODT) 4 MG disintegrating tablet Take 1 tablet (4 mg total) by mouth every 8 (eight) hours as needed for nausea or vomiting. (Patient not taking: No sig reported) 20 tablet 0   pregabalin (LYRICA) 75 MG capsule Take 1 capsule (75 mg total) by mouth 2 (two) times daily. Needs appt for Refill (Patient not taking: No sig reported) 60 capsule 0   PARoxetine (PAXIL) 10 MG tablet Take 1 tablet (10 mg total) by mouth daily. (Patient not taking: No sig reported)  30 tablet 2   traZODone (DESYREL) 100 MG tablet Take 1 tablet (100 mg total) by mouth at bedtime as needed and may repeat dose one time if needed for sleep. (Patient not taking: No sig reported) 30 tablet 2   No facility-administered medications prior to visit.    Allergies  Allergen Reactions   Other Anaphylaxis    Fire ants   Ed-Apap [Acetaminophen]    Fentanyl Hives and Itching   Percocet [  Oxycodone-Acetaminophen] Hives and Itching   Robaxin [Methocarbamol] Rash    ROS Review of Systems  Constitutional:  Positive for activity change, appetite change, fatigue and unexpected weight change. Negative for chills and fever.       Has had recent weight gain and abdominal swelling despite decreased appetite and increased fatigue.  HENT:  Negative for congestion, postnasal drip, rhinorrhea, sinus pressure, sinus pain, sneezing and sore throat.   Eyes: Negative.   Respiratory:  Negative for cough, chest tightness, shortness of breath and wheezing.   Cardiovascular:  Negative for chest pain and palpitations.  Gastrointestinal:  Positive for abdominal distention and nausea. Negative for abdominal pain, constipation, diarrhea and vomiting.  Endocrine: Negative for cold intolerance, heat intolerance, polydipsia and polyuria.  Genitourinary:  Positive for frequency, menstrual problem, pelvic pain and urgency. Negative for dyspareunia, dysuria and flank pain.  Musculoskeletal:  Negative for arthralgias, back pain and myalgias.  Skin:  Negative for rash.  Allergic/Immunologic: Negative for environmental allergies.       New anaphylactic allergy to fire ants.  Neurological:  Negative for dizziness, weakness and headaches.  Hematological:  Negative for adenopathy.  Psychiatric/Behavioral:  Positive for agitation, dysphoric mood and sleep disturbance. The patient is nervous/anxious.      Objective:    Physical Exam Vitals and nursing note reviewed.  Constitutional:      General: She is in  acute distress.     Appearance: Normal appearance. She is well-developed.  HENT:     Head: Normocephalic and atraumatic.     Right Ear: Ear canal and external ear normal.     Left Ear: Ear canal and external ear normal.     Nose: Nose normal.     Mouth/Throat:     Mouth: Mucous membranes are moist.  Eyes:     Extraocular Movements: Extraocular movements intact.     Conjunctiva/sclera: Conjunctivae normal.     Pupils: Pupils are equal, round, and reactive to light.  Cardiovascular:     Rate and Rhythm: Normal rate and regular rhythm.     Pulses: Normal pulses.     Heart sounds: Normal heart sounds.  Pulmonary:     Effort: Pulmonary effort is normal.     Breath sounds: Normal breath sounds.  Abdominal:     General: Bowel sounds are normal. There is distension.     Palpations: Abdomen is soft. There is no mass.     Tenderness: There is abdominal tenderness. There is no guarding.     Hernia: No hernia is present.     Comments: There are some mild, generalized abdominal tenderness with palpation.  Musculoskeletal:        General: Normal range of motion.     Cervical back: Normal range of motion and neck supple.  Lymphadenopathy:     Cervical: No cervical adenopathy.  Skin:    General: Skin is warm and dry.     Capillary Refill: Capillary refill takes less than 2 seconds.  Neurological:     General: No focal deficit present.     Mental Status: She is alert and oriented to person, place, and time.  Psychiatric:        Attention and Perception: Attention and perception normal.        Mood and Affect: Mood is anxious. Affect is labile.        Speech: Speech is rapid and pressured.        Behavior: Behavior normal. Behavior is cooperative.  Thought Content: Thought content normal. Thought content does not include homicidal or suicidal ideation.        Cognition and Memory: Cognition normal.        Judgment: Judgment normal.   Today's Vitals   08/28/21 1518  BP: 104/65   Pulse: 82  Temp: 98.3 F (36.8 C)  SpO2: 97%  Weight: 171 lb 12.8 oz (77.9 kg)  Height: $Remove'5\' 5"'PCKWdnU$  (1.651 m)   Body mass index is 28.59 kg/m.   Wt Readings from Last 3 Encounters:  08/28/21 171 lb 12.8 oz (77.9 kg)  08/19/21 209 lb 7 oz (95 kg)  07/21/19 190 lb (86.2 kg)     Health Maintenance Due  Topic Date Due   COVID-19 Vaccine (1) Never done   Pneumococcal Vaccine 70-107 Years old (1 - PCV) Never done   Hepatitis C Screening  Never done   PAP SMEAR-Modifier  12/15/2020   INFLUENZA VACCINE  Never done    There are no preventive care reminders to display for this patient.  Lab Results  Component Value Date   TSH 1.240 08/28/2021   Lab Results  Component Value Date   WBC 10.1 08/28/2021   HGB 15.3 08/28/2021   HCT 45.7 08/28/2021   MCV 94 08/28/2021   PLT 375 08/28/2021   Lab Results  Component Value Date   NA 144 08/28/2021   K CANCELED 08/28/2021   CO2 24 08/28/2021   GLUCOSE CANCELED 08/28/2021   BUN 17 08/28/2021   CREATININE 0.85 08/28/2021   BILITOT 0.3 08/28/2021   ALKPHOS 121 08/28/2021   AST 14 08/28/2021   ALT 13 08/28/2021   PROT 6.9 08/28/2021   ALBUMIN 4.6 08/28/2021   CALCIUM 9.6 08/28/2021   ANIONGAP 11 08/19/2021   EGFR 84 08/28/2021   Lab Results  Component Value Date   CHOL 254 (H) 06/25/2018   Lab Results  Component Value Date   HDL 61 06/25/2018   Lab Results  Component Value Date   LDLCALC 146 (H) 06/25/2018   Lab Results  Component Value Date   TRIG 197 (H) 07/28/2018   Lab Results  Component Value Date   CHOLHDL 4.2 06/25/2018   Lab Results  Component Value Date   HGBA1C 5.6 08/28/2021      Assessment & Plan:  1. Fatigue, unspecified type Will check labs including CBC, C80 and folic acid, and vitamin D for further evaluation. - CBC with Differential/Platelet; Future - VITAMIN D 25 Hydroxy (Vit-D Deficiency, Fractures); Future - B12 and Folate Panel - VITAMIN D 25 Hydroxy (Vit-D Deficiency, Fractures) - CBC  with Differential/Platelet  2. Pelvic swelling Positive home pregnancy test.  We will check serum quantitative hCG.  There is pelvic swelling/distention present.  We will get transvaginal pelvic ultrasound for further evaluation. - CBC with Differential/Platelet; Future - Iron, TIBC and Ferritin Panel - CBC with Differential/Platelet - US Pelvic Complete With Transvaginal; Future  3. Elevated serum hCG Check serum quantitative hCG.  We will also check prolactin, estradiol, FSH, and LH.  We will also get transvaginal pelvic ultrasound for further evaluation. - CBC with Differential/Platelet; Future - Prolactin - Estradiol - hCG, serum, qualitative; Future - hCG, serum, qualitative - CBC with Differential/Platelet - US Pelvic Complete With Transvaginal; Future  4. Perimenopausal Check thyroid panel, reproductive hormones, CBC, c-Met, and will get ultrasound of the pelvis for further evaluation. - CBC with Differential/Platelet; Future - Comprehensive metabolic panel; Future - TSH; Future - T4, free; Future - Iron, TIBC and Ferritin  Panel - FSH - LH - T4, free - TSH - Comprehensive metabolic panel - CBC with Differential/Platelet - US Pelvic Complete With Transvaginal; Future  5. Vitamin D deficiency Check vitamin D level and treat deficiency as indicated. - CBC with Differential/Platelet; Future - VITAMIN D 25 Hydroxy (Vit-D Deficiency, Fractures); Future - VITAMIN D 25 Hydroxy (Vit-D Deficiency, Fractures) - CBC with Differential/Platelet  6. Insomnia due to anxiety and fear Restart trazodone 100 mg tablets.  May take at bedtime as needed for insomnia related to anxiety.  Will compose a letter for her landlord stating that her [place] dog is therapy and covering for her and is in best interest of the patient regarding physical and mental health, to have a therapy dog at home. - traZODone (DESYREL) 100 MG tablet; Take 1 tablet (100 mg total) by mouth at bedtime as needed  and may repeat dose one time if needed for sleep.  Dispense: 30 tablet; Refill: 0  7. PTSD (post-traumatic stress disorder) Will compose a letter for her landlord stating that her [place] dog is therapy and covering for her and is in best interest of the patient regarding physical and mental health, to have a therapy dog at home.  Will refer to psychiatry for further evaluation and treatment. - Ambulatory referral to Psychiatry  8. Generalized anxiety disorder Will compose a letter for her landlord stating that her [place] dog is therapy and covering for her and is in best interest of the patient regarding physical and mental health, to have a therapy dog at home.  Will refer to psychiatry for further evaluation and treatment. - Ambulatory referral to Psychiatry  9. Anaphylaxis, subsequent encounter Patient treated in the ER on 08/19/2021 for anaphylactic reaction to fire ants.  New prescription for EpiPen sent to patient's pharmacy.  Reviewed instructions for proper use.  Advised her to seek emergency care if she does have to use EpiPen for any reason.  She voiced understanding and agreement with the plan. - EPINEPHrine (EPIPEN 2-PAK) 0.3 mg/0.3 mL IJ SOAJ injection; Inject 0.3 mg into the muscle as needed for anaphylaxis.  Dispense: 1 each; Refill: 1   Problem List Items Addressed This Visit       Other   Fatigue - Primary   Relevant Orders   CBC with Differential/Platelet (Completed)   VITAMIN D 25 Hydroxy (Vit-D Deficiency, Fractures) (Completed)   B12 and Folate Panel (Completed)   Generalized anxiety disorder   Relevant Medications   traZODone (DESYREL) 100 MG tablet   Other Relevant Orders   Ambulatory referral to Psychiatry   Pelvic swelling   Relevant Orders   CBC with Differential/Platelet (Completed)   Iron, TIBC and Ferritin Panel (Completed)   US Pelvic Complete With Transvaginal   Elevated serum hCG   Relevant Orders   CBC with Differential/Platelet (Completed)    Prolactin (Completed)   Estradiol (Completed)   hCG, serum, qualitative   US Pelvic Complete With Transvaginal   Perimenopausal   Relevant Orders   CBC with Differential/Platelet (Completed)   Comprehensive metabolic panel (Completed)   TSH (Completed)   T4, free (Completed)   Iron, TIBC and Ferritin Panel (Completed)   FSH (Completed)   LH (Completed)   US Pelvic Complete With Transvaginal   Vitamin D deficiency   Relevant Orders   CBC with Differential/Platelet (Completed)   VITAMIN D 25 Hydroxy (Vit-D Deficiency, Fractures) (Completed)   Insomnia due to anxiety and fear   Relevant Medications   traZODone (DESYREL) 100 MG tablet  PTSD (post-traumatic stress disorder)   Relevant Medications   traZODone (DESYREL) 100 MG tablet   Other Relevant Orders   Ambulatory referral to Psychiatry   Anaphylactic syndrome   Relevant Medications   EPINEPHrine (EPIPEN 2-PAK) 0.3 mg/0.3 mL IJ SOAJ injection    Meds ordered this encounter  Medications   DISCONTD: traZODone (DESYREL) 100 MG tablet    Sig: Take 1 tablet (100 mg total) by mouth at bedtime as needed and may repeat dose one time if needed for sleep.    Dispense:  30 tablet    Refill:  0    Order Specific Question:   Supervising Provider    Answer:   Beatrice Lecher D [[id number]]   EPINEPHrine (EPIPEN 2-PAK) 0.3 mg/0.3 mL IJ SOAJ injection    Sig: Inject 0.3 mg into the muscle as needed for anaphylaxis.    Dispense:  1 each    Refill:  1    Order Specific Question:   Supervising Provider    Answer:   Beatrice Lecher D [[id number]]   traZODone (DESYREL) 100 MG tablet    Sig: Take 1 tablet (100 mg total) by mouth at bedtime as needed and may repeat dose one time if needed for sleep.    Dispense:  30 tablet    Refill:  0    Order Specific Question:   Supervising Provider    Answer:   Beatrice Lecher D [[id number]]   This note was dictated using Dragon Voice Recognition Software. Rapid proofreading was performed to expedite the  delivery of the information. Despite proofreading, phonetic errors will occur which are common with this voice recognition software. Please take this into consideration. If there are any concerns, please contact our office.    Follow-up: Return in about 2 weeks (around 09/11/2021) for health maintenance exam, with pap - she needs 40 minutes - had to do just office visit today. Marland Kitchen    Ronnell Freshwater, NP

## 2021-08-29 LAB — IRON,TIBC AND FERRITIN PANEL
Ferritin: 91 ng/mL (ref 15–150)
Iron Saturation: 13 % — ABNORMAL LOW (ref 15–55)
Iron: 45 ug/dL (ref 27–159)
Total Iron Binding Capacity: 340 ug/dL (ref 250–450)
UIBC: 295 ug/dL (ref 131–425)

## 2021-08-29 LAB — CBC WITH DIFFERENTIAL/PLATELET
Basophils Absolute: 0 10*3/uL (ref 0.0–0.2)
Basos: 0 %
EOS (ABSOLUTE): 0.2 10*3/uL (ref 0.0–0.4)
Eos: 2 %
Hematocrit: 45.7 % (ref 34.0–46.6)
Hemoglobin: 15.3 g/dL (ref 11.1–15.9)
Immature Grans (Abs): 0.1 10*3/uL (ref 0.0–0.1)
Immature Granulocytes: 1 %
Lymphocytes Absolute: 3.1 10*3/uL (ref 0.7–3.1)
Lymphs: 31 %
MCH: 31.6 pg (ref 26.6–33.0)
MCHC: 33.5 g/dL (ref 31.5–35.7)
MCV: 94 fL (ref 79–97)
Monocytes Absolute: 0.7 10*3/uL (ref 0.1–0.9)
Monocytes: 7 %
Neutrophils Absolute: 6 10*3/uL (ref 1.4–7.0)
Neutrophils: 59 %
Platelets: 375 10*3/uL (ref 150–450)
RBC: 4.84 x10E6/uL (ref 3.77–5.28)
RDW: 11.8 % (ref 11.7–15.4)
WBC: 10.1 10*3/uL (ref 3.4–10.8)

## 2021-08-29 LAB — COMPREHENSIVE METABOLIC PANEL
ALT: 13 IU/L (ref 0–32)
AST: 14 IU/L (ref 0–40)
Albumin/Globulin Ratio: 2 (ref 1.2–2.2)
Albumin: 4.6 g/dL (ref 3.8–4.8)
Alkaline Phosphatase: 121 IU/L (ref 44–121)
BUN/Creatinine Ratio: 20 (ref 9–23)
BUN: 17 mg/dL (ref 6–24)
Bilirubin Total: 0.3 mg/dL (ref 0.0–1.2)
CO2: 24 mmol/L (ref 20–29)
Calcium: 9.6 mg/dL (ref 8.7–10.2)
Chloride: 102 mmol/L (ref 96–106)
Creatinine, Ser: 0.85 mg/dL (ref 0.57–1.00)
Globulin, Total: 2.3 g/dL (ref 1.5–4.5)
Sodium: 144 mmol/L (ref 134–144)
Total Protein: 6.9 g/dL (ref 6.0–8.5)
eGFR: 84 mL/min/{1.73_m2} (ref >59)

## 2021-08-29 LAB — TSH: TSH: 1.24 u[IU]/mL (ref 0.450–4.500)

## 2021-08-29 LAB — B12 AND FOLATE PANEL
Folate: >20 ng/mL (ref >3.0)
Vitamin B-12: 1588 pg/mL — ABNORMAL HIGH (ref 232–1245)

## 2021-08-29 LAB — LUTEINIZING HORMONE: LH: 34.7 m[IU]/mL

## 2021-08-29 LAB — T4, FREE: Free T4: 1.17 ng/dL (ref 0.82–1.77)

## 2021-08-29 LAB — VITAMIN D 25 HYDROXY (VIT D DEFICIENCY, FRACTURES): Vit D, 25-Hydroxy: 37.4 ng/mL (ref 30.0–100.0)

## 2021-08-29 LAB — HCG, SERUM, QUALITATIVE: hCG,Beta Subunit,Qual,Serum: NEGATIVE m[IU]/mL (ref <6)

## 2021-08-29 LAB — ESTRADIOL: Estradiol: 11.6 pg/mL

## 2021-08-29 LAB — PROLACTIN: Prolactin: 5.5 ng/mL (ref 4.8–23.3)

## 2021-08-29 LAB — FOLLICLE STIMULATING HORMONE: FSH: 61.8 m[IU]/mL

## 2021-08-29 NOTE — Progress Notes (Signed)
Waiting on resullts of glucose and HgbA1c. So far all labs normal. Labs indicate post menopausal. HCG is negative .

## 2021-08-30 LAB — HGB A1C W/O EAG: Hgb A1c MFr Bld: 5.6 % (ref 4.8–5.6)

## 2021-08-30 LAB — SPECIMEN STATUS REPORT

## 2021-08-30 NOTE — Progress Notes (Signed)
Please let the patient know that her blood work looked essentially normal. Serum pregnancy test was negative and hormone levels indicate that she is menopausal. Will be important that she have the pelvic ultrasound done so we can further evaluate what is going on. Thanks. -HB

## 2021-09-03 ENCOUNTER — Telehealth: Payer: Self-pay | Admitting: *Deleted

## 2021-09-03 NOTE — Telephone Encounter (Signed)
Patient calling office requesting elaboration of labs. Patient also calling requesting a letter for her landlord stating her dog should be allowed for emotional support. AS, CMA

## 2021-09-05 DIAGNOSIS — R7989 Other specified abnormal findings of blood chemistry: Secondary | ICD-10-CM | POA: Insufficient documentation

## 2021-09-05 DIAGNOSIS — E349 Endocrine disorder, unspecified: Secondary | ICD-10-CM | POA: Insufficient documentation

## 2021-09-05 DIAGNOSIS — R1900 Intra-abdominal and pelvic swelling, mass and lump, unspecified site: Secondary | ICD-10-CM | POA: Insufficient documentation

## 2021-09-05 DIAGNOSIS — F409 Phobic anxiety disorder, unspecified: Secondary | ICD-10-CM | POA: Insufficient documentation

## 2021-09-05 DIAGNOSIS — E559 Vitamin D deficiency, unspecified: Secondary | ICD-10-CM | POA: Insufficient documentation

## 2021-09-05 DIAGNOSIS — Z0001 Encounter for general adult medical examination with abnormal findings: Secondary | ICD-10-CM | POA: Insufficient documentation

## 2021-09-05 DIAGNOSIS — T782XXA Anaphylactic shock, unspecified, initial encounter: Secondary | ICD-10-CM | POA: Insufficient documentation

## 2021-09-05 DIAGNOSIS — N951 Menopausal and female climacteric states: Secondary | ICD-10-CM | POA: Insufficient documentation

## 2021-09-05 DIAGNOSIS — F4310 Post-traumatic stress disorder, unspecified: Secondary | ICD-10-CM | POA: Insufficient documentation

## 2021-09-05 NOTE — Telephone Encounter (Signed)
Patient calling back again today requesting a letter for her landlord to allow her dog for emotional support. Patient states she needs this letter ASAP. She is requesting to be contacted back today by end of business. AS, CMA

## 2021-09-05 NOTE — Telephone Encounter (Signed)
Called pt she is aware of coming in office to pick up letter

## 2021-09-05 NOTE — Telephone Encounter (Signed)
Please let her know I can have this ready for her first thing in the morning tomorrow. She can pick it up around 9am tomorrow am. Thanks

## 2021-09-23 ENCOUNTER — Encounter: Payer: Self-pay | Admitting: Nurse Practitioner

## 2021-09-23 ENCOUNTER — Ambulatory Visit (INDEPENDENT_AMBULATORY_CARE_PROVIDER_SITE_OTHER): Payer: 59 | Admitting: Nurse Practitioner

## 2021-09-23 VITALS — Ht 65.0 in | Wt 170.0 lb

## 2021-09-23 DIAGNOSIS — J019 Acute sinusitis, unspecified: Secondary | ICD-10-CM | POA: Insufficient documentation

## 2021-09-23 MED ORDER — AZITHROMYCIN 250 MG PO TABS: ORAL_TABLET | ORAL | 0 refills | Status: DC

## 2021-09-23 NOTE — Progress Notes (Signed)
Virtual Visit via Telephone Note  I connected with Stacie Sanders on 09/23/21 at  3:10 PM EDT by telephone and verified that I am speaking with the correct person using two identifiers.  Location: Patient: home  Provider: [place] [facility]     I discussed the limitations, risks, security and privacy concerns of performing an evaluation and management service by telephone and the availability of in person appointments. I also discussed with the patient that there may be a patient responsible charge related to this service. The patient expressed understanding and agreed to proceed.   History of Present Illness: The patient presents for acute visit.  States on Friday, she came home from work with low-grade fever and diarrhea.  Saturday, she woke up with severe fatigue and continued low-grade fever.  She had body aches, chills, headache, sneezing, and cough.  She states that cough is congested and sometimes productive.  She states that she is a smoker, but even so, does not generally have a cough.  She states due to symptoms, she did take a COVID-19 test on Friday.  Results were negative.  She states that Saturday going into Sunday, diarrhea and low-grade fever had resolved.  Sneezing and cough are worse.  She is significantly congested in the sinuses and nasal passages.  Cough continues to be productive.  She continues to have body aches and chills.  She states she took a second COVID-19 test on Sunday, and results were still negative.  She states that she has been taking Bayer back and body.  This is helped her body aches some.  She states that she had to take the day off work today, as symptoms are so severe.  She denies nausea, vomiting, or diarrhea.  States she has reduced appetite.  Observations/Objective:   The patient is alert and oriented. She is pleasant and answers all questions appropriately. Breathing is non-labored. She is in no acute distress at this time.  The  patient is nasally congested.  A loose sounding cough can be heard throughout the visit.  She sounds as though she feels ill.  Today's Vitals   09/23/21 1506  Weight: 170 lb (77.1 kg)  Height: 5\' 5"  (1.651 m)   Body mass index is 28.29 kg/m.   Assessment and Plan: 1. Acute non-recurrent sinusitis, unspecified location Patient with 2 separate negative COVID-19 tests.  Suspect moderate to severe acute sinusitis.  Start Z-Pak.  Take as directed for 5 days.  Recommend she rest and increase fluids.  She should take OTC medications such as DayQuil and NyQuil for acute symptom relief.  We will give her a note stating that she was seen today and should be out of work today, 09/23/2021 due to acute illness.  This to her via email to print off for her employer. - azithromycin (ZITHROMAX) 250 MG tablet; z-pack - take as directed for 5 days  Dispense: 6 tablet; Refill: 0   Follow Up Instructions:  This note was dictated using 09/25/2021. Rapid proofreading was performed to expedite the delivery of the information. Despite proofreading, phonetic errors will occur which are common with this voice recognition software. Please take this into consideration. If there are any concerns, please contact our office.     I discussed the assessment and treatment plan with the patient. The patient was provided an opportunity to ask questions and all were answered. The patient agreed with the plan and demonstrated an understanding of the instructions.  The patient was advised to call back or seek an in-person evaluation if the symptoms worsen or if the condition fails to improve as anticipated.  I provided 15 minutes of non-face-to-face time during this encounter.   Carlean Jews, NP

## 2021-09-24 ENCOUNTER — Telehealth: Payer: Self-pay | Admitting: Nurse Practitioner

## 2021-09-24 NOTE — Telephone Encounter (Signed)
Patient calling requesting a work note for today. She had a telehealth apt yesterday and said she wasn't feeling better today so she stayed home.   AS, CMA

## 2021-09-25 ENCOUNTER — Ambulatory Visit
Admission: RE | Admit: 2021-09-25 | Discharge: 2021-09-25 | Disposition: A | Discharge status: Home / Self Care | Payer: Medicaid Other | Source: Ambulatory Visit | Attending: Nurse Practitioner | Admitting: Nurse Practitioner

## 2021-09-25 DIAGNOSIS — E349 Endocrine disorder, unspecified: Secondary | ICD-10-CM

## 2021-09-25 DIAGNOSIS — R1900 Intra-abdominal and pelvic swelling, mass and lump, unspecified site: Secondary | ICD-10-CM

## 2021-09-25 DIAGNOSIS — N951 Menopausal and female climacteric states: Secondary | ICD-10-CM

## 2021-09-25 NOTE — Telephone Encounter (Signed)
I took care of this for her and emailed it to her directly.

## 2021-10-03 ENCOUNTER — Encounter: Payer: Self-pay | Admitting: Emergency Medicine

## 2021-10-03 ENCOUNTER — Other Ambulatory Visit: Payer: Self-pay

## 2021-10-03 ENCOUNTER — Ambulatory Visit
Admission: EM | Admit: 2021-10-03 | Discharge: 2021-10-03 | Disposition: A | Discharge status: Home / Self Care | Payer: 59 | Attending: Urgent Care | Admitting: Urgent Care

## 2021-10-03 DIAGNOSIS — T24232A Burn of second degree of left lower leg, initial encounter: Secondary | ICD-10-CM | POA: Diagnosis not present

## 2021-10-03 DIAGNOSIS — L089 Local infection of the skin and subcutaneous tissue, unspecified: Secondary | ICD-10-CM

## 2021-10-03 DIAGNOSIS — T148XXA Other injury of unspecified body region, initial encounter: Secondary | ICD-10-CM

## 2021-10-03 DIAGNOSIS — M79605 Pain in left leg: Secondary | ICD-10-CM

## 2021-10-03 MED ORDER — SILVER SULFADIAZINE 1 % EX CREA: 1.0000 "application " | TOPICAL_CREAM | Freq: Every day | CUTANEOUS | 0 refills | Status: DC

## 2021-10-03 MED ORDER — DOXYCYCLINE HYCLATE 100 MG PO CAPS: 100.0000 mg | ORAL_CAPSULE | Freq: Two times a day (BID) | ORAL | 0 refills | Status: DC

## 2021-10-03 NOTE — Discharge Instructions (Addendum)
Please change your dressing 2-3 times daily. Apply silvadene cream generously and secure with a non-stick dressing. Each time you change your dressing, make sure you clean gently around the perimeter of the wound with gentle soap and warm water. Do not peel off any dead skin. If it comes off in the process of washing your wound or removing the dressing, that's okay. Pat your wound dry and let it air out if possible for an hour before reapplying another dressing.   Take doxycycline to address your wound infection. Follow up with the wound clinic.
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
## 2021-10-07 ENCOUNTER — Telehealth: Payer: Self-pay | Admitting: Nurse Practitioner

## 2021-10-07 NOTE — Telephone Encounter (Signed)
Patient calling requesting results for Pelvis US ordered by [facility]. AS, CMA

## 2021-10-08 NOTE — Progress Notes (Signed)
Patinet to be notified. Referral to be made to GYN provider. Need to see if there is provider she prefers.

## 2021-10-08 NOTE — Telephone Encounter (Signed)
Please let the patient know that there is a single, small, 1.3cm lesion present in endometrium which is the lining of the uterus. I would like to refer her ASAP to GYN provider for further evaluation and treatment. Does she have a provider she would prefer to see?  Thanks. HB

## 2021-10-14 ENCOUNTER — Other Ambulatory Visit: Payer: Self-pay | Admitting: Nurse Practitioner

## 2021-10-14 ENCOUNTER — Ambulatory Visit: Payer: 59 | Admitting: Nurse Practitioner

## 2021-10-14 DIAGNOSIS — N9489 Other specified conditions associated with female genital organs and menstrual cycle: Secondary | ICD-10-CM

## 2021-10-14 NOTE — Telephone Encounter (Signed)
Attempted to contact patient with no answer. Left msg for patient to call back. AS, CMA
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
## 2021-10-14 NOTE — Progress Notes (Signed)
Talked with patient over the telephone today.  Discussed findings of pelvic ultrasound, showing 1.3 cm endometrial mass.  Recommended a referral to GYN and that order was placed today.

## 2021-10-15 ENCOUNTER — Other Ambulatory Visit: Payer: Self-pay

## 2021-10-15 ENCOUNTER — Ambulatory Visit (INDEPENDENT_AMBULATORY_CARE_PROVIDER_SITE_OTHER): Payer: 59 | Admitting: Nurse Practitioner

## 2021-10-15 ENCOUNTER — Encounter: Payer: Self-pay | Admitting: Nurse Practitioner

## 2021-10-15 VITALS — BP 106/70 | HR 77 | Temp 98.0°F | Ht 65.0 in | Wt 171.7 lb

## 2021-10-15 DIAGNOSIS — F5105 Insomnia due to other mental disorder: Secondary | ICD-10-CM

## 2021-10-15 DIAGNOSIS — L03116 Cellulitis of left lower limb: Secondary | ICD-10-CM | POA: Diagnosis not present

## 2021-10-15 DIAGNOSIS — T24332D Burn of third degree of left lower leg, subsequent encounter: Secondary | ICD-10-CM

## 2021-10-15 DIAGNOSIS — F409 Phobic anxiety disorder, unspecified: Secondary | ICD-10-CM | POA: Diagnosis not present

## 2021-10-15 DIAGNOSIS — T24232D Burn of second degree of left lower leg, subsequent encounter: Secondary | ICD-10-CM

## 2021-10-15 MED ORDER — BAND-AID KLING ROLLED GAUZE LG MISC: 1 refills | Status: DC

## 2021-10-15 MED ORDER — TRAZODONE HCL 100 MG PO TABS: 100.0000 mg | ORAL_TABLET | Freq: Every evening | ORAL | 0 refills | Status: DC | PRN

## 2021-10-15 MED ORDER — "CURAD GAUZE 4""X4"" PADS": MEDICATED_PAD | 1 refills | Status: DC

## 2021-10-15 MED ORDER — VASELINE PETROLATUM GAUZE EX PADS: MEDICATED_PAD | CUTANEOUS | 1 refills | Status: DC

## 2021-10-15 MED ORDER — DOXYCYCLINE HYCLATE 100 MG PO CAPS: 100.0000 mg | ORAL_CAPSULE | Freq: Two times a day (BID) | ORAL | 0 refills | Status: DC

## 2021-10-15 NOTE — Patient Instructions (Signed)
Wash burn twice daily with warm water and dial soap. Pat dry.  Apply silvadene cream Apply new vaseline guaze to wound Place clean, dry, guaze pad over vaseline guaze  Wrap with cling guaze and tape to secure.   Above instructions to be done twice daily.  Take doxycycline twice daily Follow up with wound care as scheduled.
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
## 2021-10-16 DIAGNOSIS — L03116 Cellulitis of left lower limb: Secondary | ICD-10-CM | POA: Insufficient documentation

## 2021-10-16 DIAGNOSIS — T24232A Burn of second degree of left lower leg, initial encounter: Secondary | ICD-10-CM | POA: Insufficient documentation

## 2021-10-23 ENCOUNTER — Other Ambulatory Visit: Payer: Self-pay

## 2021-10-23 ENCOUNTER — Encounter: Payer: 59 | Attending: Internal Medicine | Admitting: Internal Medicine

## 2021-10-23 DIAGNOSIS — T24232A Burn of second degree of left lower leg, initial encounter: Secondary | ICD-10-CM | POA: Diagnosis not present

## 2021-10-23 DIAGNOSIS — Z792 Long term (current) use of antibiotics: Secondary | ICD-10-CM | POA: Insufficient documentation

## 2021-10-23 DIAGNOSIS — F17200 Nicotine dependence, unspecified, uncomplicated: Secondary | ICD-10-CM | POA: Insufficient documentation

## 2021-10-23 DIAGNOSIS — Z885 Allergy status to narcotic agent status: Secondary | ICD-10-CM | POA: Diagnosis not present

## 2021-10-23 DIAGNOSIS — X111XXA Contact with running hot water, initial encounter: Secondary | ICD-10-CM | POA: Diagnosis not present

## 2021-10-23 DIAGNOSIS — Z888 Allergy status to other drugs, medicaments and biological substances status: Secondary | ICD-10-CM | POA: Insufficient documentation

## 2021-10-23 DIAGNOSIS — T24202A Burn of second degree of unspecified site of left lower limb, except ankle and foot, initial encounter: Secondary | ICD-10-CM | POA: Diagnosis present

## 2021-10-29 NOTE — Progress Notes (Signed)
Stacie Sanders, Stacie Sanders ([id number]) Visit Report for 10/23/2021 Abuse/Suicide Risk Screen Details Patient Name: Stacie Sanders, Stacie Sanders Date of Service: 10/23/2021 8:45 AM Medical Record Number: [id number] Patient Account Number: [id number] Date of Birth/Sex: 06-Feb-1971 (50 y.o. Female) Treating RN: Yevonne Pax Primary Care Karin Pinedo: Vincent Gros Other Clinician: Referring Ankit Degregorio: Wallis Bamberg Treating Nzinga Ferran/Extender: Tilda Franco in Treatment: 0 Abuse/Suicide Risk Screen Items Answer ABUSE RISK SCREEN: Has anyone close to you tried to hurt or harm you recentlyo No Do you feel uncomfortable with anyone in your familyo No Has anyone forced you do things that you didnot want to doo No Electronic Signature(s) Signed: 10/28/2021 6:56:11 PM By: Yevonne Pax RN Entered By: Yevonne Pax on 10/23/2021 09:05:47 Vargus, Marc Morgans ([id number]) -------------------------------------------------------------------------------- Activities of Daily Living Details Patient Name: Stacie Sanders Date of Service: 10/23/2021 8:45 AM Medical Record Number: [id number] Patient Account Number: [id number] Date of Birth/Sex: 03-10-1971 (50 y.o. Female) Treating RN: Yevonne Pax Primary Care Larone Kliethermes: Vincent Gros Other Clinician: Referring Rameen Quinney: Wallis Bamberg Treating Madeleyn Schwimmer/Extender: Tilda Franco in Treatment: 0 Activities of Daily Living Items Answer Activities of Daily Living (Please select one for each item) Drive Automobile Completely Able Take Medications Completely Able Use Telephone Completely Able Care for Appearance Completely Able Use Toilet Completely Able Bath / Shower Completely Able Dress Self Completely Able Feed Self Completely Able Walk Completely Able Get In / Out Bed Completely Able Housework Completely Able Prepare Meals Completely Able Handle Money Completely Able Shop for Self Completely Able Electronic Signature(s) Signed: 10/28/2021 6:56:11 PM By: Yevonne Pax RN Entered By: Yevonne Pax on 10/23/2021 09:06:24 Havlik, Marc Morgans ([id number]) -------------------------------------------------------------------------------- Education Screening Details Patient Name: Stacie Sanders Date of Service: 10/23/2021 8:45 AM Medical Record Number: [id number] Patient Account Number: [id number] Date of Birth/Sex: 1971-10-06 (50 y.o. Female) Treating RN: Yevonne Pax Primary Care Feliz Lincoln: Vincent Gros Other Clinician: Referring Jaydi Bray: Wallis Bamberg Treating Kennan Detter/Extender: Tilda Franco in Treatment: 0 Primary Learner Assessed: Patient Learning Preferences/Education Level/Primary Language Learning Preference: Explanation Highest Education Level: High School Preferred Language: English Cognitive Barrier Language Barrier: No Translator Needed: No Memory Deficit: No Emotional Barrier: No Cultural/Religious Beliefs Affecting Medical Care: No Physical Barrier Impaired Vision: Yes Glasses Impaired Hearing: No Decreased Hand dexterity: No Knowledge/Comprehension Knowledge Level: Medium Comprehension Level: High Ability to understand written instructions: High Ability to understand verbal instructions: High Motivation Anxiety Level: Anxious Cooperation: Cooperative Education Importance: Acknowledges Need Interest in Health Problems: Asks Questions Perception: Coherent Willingness to Engage in Self-Management High Activities: Readiness to Engage in Self-Management High Activities: Electronic Signature(s) Signed: 10/28/2021 6:56:11 PM By: Yevonne Pax RN Entered By: Yevonne Pax on 10/23/2021 09:06:52 Blau, Marc Morgans ([id number]) -------------------------------------------------------------------------------- Fall Risk Assessment Details Patient Name: Stacie Sanders Date of Service: 10/23/2021 8:45 AM Medical Record Number: [id number] Patient Account Number: [id number] Date of Birth/Sex: [birth year] (50 y.o. Female) Treating  RN: Yevonne Pax Primary Care Vernisha Bacote: Vincent Gros Other Clinician: Referring Brightyn Mozer: Wallis Bamberg Treating Syaire Saber/Extender: Tilda Franco in Treatment: 0 Fall Risk Assessment Items Have you had 2 or more falls in the last 12 monthso 0 No Have you had any fall that resulted in injury in the last 12 monthso 0 No FALLS RISK SCREEN History of falling - immediate or within 3 months 0 No Secondary diagnosis (Do you have 2 or more medical diagnoseso) 0 No Ambulatory aid None/bed rest/wheelchair/nurse 0 No Crutches/cane/walker 0 No Furniture 0 No Intravenous therapy Access/Saline/Heparin Lock 0 No Gait/Transferring Normal/ bed rest/ wheelchair 0 No Weak (short steps with  or without shuffle, stooped but able to lift head while walking, may 0 No seek support from furniture) Impaired (short steps with shuffle, may have difficulty arising from chair, head down, impaired 0 No balance) Mental Status Oriented to own ability 0 No Electronic Signature(s) Signed: 10/28/2021 6:56:11 PM By: Yevonne Pax RN Entered By: Yevonne Pax on 10/23/2021 09:07:21 Babula, Marc Morgans ([id number]) -------------------------------------------------------------------------------- Foot Assessment Details Patient Name: Stacie Sanders Date of Service: 10/23/2021 8:45 AM Medical Record Number: [id number] Patient Account Number: [id number] Date of Birth/Sex: 10-16-1971 (50 y.o. Female) Treating RN: Yevonne Pax Primary Care Kamyra Schroeck: Vincent Gros Other Clinician: Referring Kentaro Alewine: Wallis Bamberg Treating Sonnie Bias/Extender: Tilda Franco in Treatment: 0 Foot Assessment Items Site Locations + = Sensation present, - = Sensation absent, C = Callus, U = Ulcer R = Redness, W = Warmth, M = Maceration, PU = Pre-ulcerative lesion F = Fissure, S = Swelling, D = Dryness Assessment Right: Left: Other Deformity: No No Prior Foot Ulcer: No No Prior Amputation: No No Charcot Joint: No  No Ambulatory Status: Ambulatory Without Help Gait: Steady Electronic Signature(s) Signed: 10/28/2021 6:56:11 PM By: Yevonne Pax RN Entered By: Yevonne Pax on 10/23/2021 09:17:30 Delvecchio, Marc Morgans ([id number]) -------------------------------------------------------------------------------- Nutrition Risk Screening Details Patient Name: Stacie Sanders Date of Service: 10/23/2021 8:45 AM Medical Record Number: [id number] Patient Account Number: [id number] Date of Birth/Sex: 05-Aug-1971 (50 y.o. Female) Treating RN: Yevonne Pax Primary Care Kanon Colunga: Vincent Gros Other Clinician: Referring Tiare Rohlman: Wallis Bamberg Treating Kastiel Simonian/Extender: Tilda Franco in Treatment: 0 Height (in): 65 Weight (lbs): 167 Body Mass Index (BMI): 27.8 Nutrition Risk Screening Items Score Screening NUTRITION RISK SCREEN: I have an illness or condition that made me change the kind and/or amount of food I eat 0 No I eat fewer than two meals per day 0 No I eat few fruits and vegetables, or milk products 0 No I have three or more drinks of beer, liquor or wine almost every day 0 No I have tooth or mouth problems that make it hard for me to eat 0 No I don't always have enough money to buy the food I need 0 No I eat alone most of the time 0 No I take three or more different prescribed or over-the-counter drugs a day 1 Yes Without wanting to, I have lost or gained 10 pounds in the last six months 0 No I am not always physically able to shop, cook and/or feed myself 0 No Nutrition Protocols Good Risk Protocol 0 No interventions needed Moderate Risk Protocol High Risk Proctocol Risk Level: Good Risk Score: 1 Electronic Signature(s) Signed: 10/28/2021 6:56:11 PM By: Yevonne Pax RN Entered By: Yevonne Pax on 10/23/2021 09:08:11

## 2021-10-29 NOTE — Progress Notes (Signed)
BAILA, ROUSE ([id number]) Visit Report for 10/23/2021 Allergy List Details Patient Name: Stacie Sanders, Stacie Sanders Date of Service: 10/23/2021 8:45 AM Medical Record Number: [id number] Patient Account Number: [id number] Date of Birth/Sex: [birth year] (50 y.o. Female) Treating RN: Yevonne Pax Primary Care Meng Winterton: Vincent Gros Other Clinician: Referring Melvinia Ashby: Wallis Bamberg Treating Rayvion Stumph/Extender: Tilda Franco in Treatment: 0 Allergies Active Allergies fentanyl Percocet Robaxin Allergy Notes Electronic Signature(s) Signed: 10/28/2021 6:56:11 PM By: Yevonne Pax RN Entered By: Yevonne Pax on 10/23/2021 09:03:27 Behanna, Marc Morgans ([id number]) -------------------------------------------------------------------------------- Arrival Information Details Patient Name: Stacie Sanders Date of Service: 10/23/2021 8:45 AM Medical Record Number: [id number] Patient Account Number: [id number] Date of Birth/Sex: [birth year] (50 y.o. Female) Treating RN: Yevonne Pax Primary Care Nereyda Bowler: Vincent Gros Other Clinician: Referring Randall Rampersad: Wallis Bamberg Treating Larosa Rhines/Extender: Tilda Franco in Treatment: 0 Visit Information Patient Arrived: Ambulatory Arrival Time: 08:56 Accompanied By: self Transfer Assistance: None Patient Identification Verified: Yes Secondary Verification Process Completed: Yes Patient Requires Transmission-Based Precautions: No Patient Has Alerts: No Electronic Signature(s) Signed: 10/28/2021 6:56:11 PM By: Yevonne Pax RN Entered By: Yevonne Pax on 10/23/2021 09:01:11 Condie, Marc Morgans ([id number]) -------------------------------------------------------------------------------- Clinic Level of Care Assessment Details Patient Name: Stacie Sanders Date of Service: 10/23/2021 8:45 AM Medical Record Number: [id number] Patient Account Number: [id number] Date of Birth/Sex: 1971-11-23 (50 y.o. Female) Treating RN: Yevonne Pax Primary Care Emonte Dieujuste:  Vincent Gros Other Clinician: Referring Mykhia Danish: Wallis Bamberg Treating Angelique Chevalier/Extender: Tilda Franco in Treatment: 0 Clinic Level of Care Assessment Items TOOL 2 Quantity Score X - Use when only an EandM is performed on the INITIAL visit 1 0 ASSESSMENTS - Nursing Assessment / Reassessment X - General Physical Exam (combine w/ comprehensive assessment (listed just below) when performed on new 1 20 pt. evals) X- 1 25 Comprehensive Assessment (HX, ROS, Risk Assessments, Wounds Hx, etc.) ASSESSMENTS - Wound and Skin Assessment / Reassessment X - Simple Wound Assessment / Reassessment - one wound 1 5 []  - 0 Complex Wound Assessment / Reassessment - multiple wounds []  - 0 Dermatologic / Skin Assessment (not related to wound area) ASSESSMENTS - Ostomy and/or Continence Assessment and Care []  - Incontinence Assessment and Management 0 []  - 0 Ostomy Care Assessment and Management (repouching, etc.) PROCESS - Coordination of Care X - Simple Patient / Family Education for ongoing care 1 15 []  - 0 Complex (extensive) Patient / Family Education for ongoing care []  - 0 Staff obtains , Records, Test Results / Process Orders []  - 0 Staff telephones HHA, Nursing Homes / Clarify orders / etc []  - 0 Routine Transfer to another Facility (non-emergent condition) []  - 0 Routine Hospital Admission (non-emergent condition) []  - 0 New Admissions / / Ordering NPWT, Apligraf, etc. []  - 0 Emergency Hospital Admission (emergent condition) X- 1 10 Simple Discharge Coordination []  - 0 Complex (extensive) Discharge Coordination PROCESS - Special Needs []  - Pediatric / Minor Patient Management 0 []  - 0 Isolation Patient Management []  - 0 Hearing / Language / Visual special needs []  - 0 Assessment of Community assistance (transportation, D/C planning, etc.) []  - 0 Additional assistance / Altered mentation []  - 0 Support Surface(s) Assessment (bed,  cushion, seat, etc.) INTERVENTIONS - Wound Cleansing / Measurement X - Wound Imaging (photographs - any number of wounds) 1 5 []  - 0 Wound Tracing (instead of photographs) X- 1 5 Simple Wound Measurement - one wound []  - 0 Complex Wound Measurement - multiple wounds Raimer, Kacey L. ( ) X- 1 5 Simple Wound  Cleansing - one wound []  - 0 Complex Wound Cleansing - multiple wounds INTERVENTIONS - Wound Dressings X - Small Wound Dressing one or multiple wounds 1 10 []  - 0 Medium Wound Dressing one or multiple wounds []  - 0 Large Wound Dressing one or multiple wounds []  - 0 Application of Medications - injection INTERVENTIONS - Miscellaneous []  - External ear exam 0 []  - 0 Specimen Collection (cultures, biopsies, blood, body fluids, etc.) []  - 0 Specimen(s) / Culture(s) sent or taken to Lab for analysis []  - 0 Patient Transfer (multiple staff / Nurse, adult / Similar devices) []  - 0 Simple Staple / Suture removal (25 or less) []  - 0 Complex Staple / Suture removal (26 or more) []  - 0 Hypo / Hyperglycemic Management (close monitor of Blood Glucose) X- 1 15 Ankle / Brachial Index (ABI) - do not check if billed separately Has the patient been seen at the hospital within the last three years: Yes Total Score: 115 Level Of Care: New/Established - Level 3 Electronic Signature(s) Signed: 10/28/2021 6:56:11 PM By: Yevonne Pax RN Entered By: Yevonne Pax on 10/23/2021 09:52:38 Selsor, Marc Morgans ([id number]) -------------------------------------------------------------------------------- Encounter Discharge Information Details Patient Name: Stacie Sanders Date of Service: 10/23/2021 8:45 AM Medical Record Number: [id number] Patient Account Number: [id number] Date of Birth/Sex: 28-Nov-1971 (50 y.o. Female) Treating RN: Yevonne Pax Primary Care Wiliam Cauthorn: Vincent Gros Other Clinician: Referring Lillien Petronio: Wallis Bamberg Treating Ajmal Kathan/Extender: Tilda Franco in  Treatment: 0 Encounter Discharge Information Items Discharge Condition: Stable Ambulatory Status: Ambulatory Discharge Destination: Home Transportation: Private Auto Accompanied By: self Schedule Follow-up Appointment: Yes Clinical Summary of Care: Patient Declined Electronic Signature(s) Signed: 10/23/2021 9:53:40 AM By: Yevonne Pax RN Entered By: Yevonne Pax on 10/23/2021 09:53:40 Freeze, Marc Morgans ([id number]) -------------------------------------------------------------------------------- Lower Extremity Assessment Details Patient Name: Stacie Sanders Date of Service: 10/23/2021 8:45 AM Medical Record Number: [id number] Patient Account Number: [id number] Date of Birth/Sex: June 04, 1971 (50 y.o. Female) Treating RN: Yevonne Pax Primary Care Ahri Olson: Vincent Gros Other Clinician: Referring Camdan Burdi: Wallis Bamberg Treating Meribeth Vitug/Extender: Tilda Franco in Treatment: 0 Edema Assessment Assessed: [Left: No] [Right: No] Edema: [Left: Ye] [Right: s] Calf Left: Right: Point of Measurement: 32 cm From Medial Instep 37 cm Ankle Left: Right: Point of Measurement: 10 cm From Medial Instep 20 cm Knee To Floor Left: Right: From Medial Instep 45 cm Vascular Assessment Pulses: Dorsalis Pedis Palpable: [Left:Yes] Blood Pressure: Brachial: [Left:127] Ankle: [Left:Dorsalis Pedis: 120 0.94] Electronic Signature(s) Signed: 10/28/2021 6:56:11 PM By: Yevonne Pax RN Entered By: Yevonne Pax on 10/23/2021 09:20:41 Cleckler, Marc Morgans ([id number]) -------------------------------------------------------------------------------- Multi Wound Chart Details Patient Name: Stacie Sanders Date of Service: 10/23/2021 8:45 AM Medical Record Number: [id number] Patient Account Number: [id number] Date of Birth/Sex: 04/03/1971 (50 y.o. Female) Treating RN: Yevonne Pax Primary Care Randall Colden: Vincent Gros Other Clinician: Referring Suad Autrey: Wallis Bamberg Treating Aleni Andrus/Extender:  Tilda Franco in Treatment: 0 Vital Signs Height(in): 65 Pulse(bpm): 76 Weight(lbs): 167 Blood Pressure(mmHg): 127/74 Body Mass Index(BMI): 28 Temperature(F): 98.1 Respiratory Rate(breaths/min): 16 Photos: [1:No Photos] [N/A:N/A] Wound Location: [1:Left Lower Leg] [N/A:N/A] Wounding Event: [1:Thermal Burn] [N/A:N/A] Primary Etiology: [1:2nd degree Burn] [N/A:N/A] Date Acquired: [1:09/30/2021] [N/A:N/A] Weeks of Treatment: [1:0] [N/A:N/A] Wound Status: [1:Open] [N/A:N/A] Measurements L x W x D (cm) [1:0.6x0.7x0.1] [N/A:N/A] Area (cm) : [1:0.33] [N/A:N/A] Volume (cm) : [1:0.033] [N/A:N/A] Classification: [1:Full Thickness Without Exposed Support Structures] [N/A:N/A] Exudate Amount: [1:Medium] [N/A:N/A] Exudate Type: [1:Serosanguineous] [N/A:N/A] Exudate Color: [1:red, brown] [N/A:N/A] Granulation Amount: [1:Medium (34-66%)] [N/A:N/A] Granulation Quality: [1:Red] [N/A:N/A] Necrotic Amount: [  1:Medium (34-66%)] [N/A:N/A] Exposed Structures: [1:Fat Layer (Subcutaneous Tissue): Yes Fascia: No Tendon: No Muscle: No Joint: No Bone: No None] [N/A:N/A N/A] Treatment Notes Electronic Signature(s) Signed: 10/23/2021 10:30:11 AM By: Geralyn Corwin DO Entered By: Geralyn Corwin on 10/23/2021 09:42:10 Daquila, Marc Morgans ([id number]) -------------------------------------------------------------------------------- Multi-Disciplinary Care Plan Details Patient Name: Stacie Sanders Date of Service: 10/23/2021 8:45 AM Medical Record Number: [id number] Patient Account Number: [id number] Date of Birth/Sex: [birth year] (50 y.o. Female) Treating RN: Yevonne Pax Primary Care Melek Pownall: Vincent Gros Other Clinician: Referring Cadey Bazile: Wallis Bamberg Treating Lashawn Bromwell/Extender: Tilda Franco in Treatment: 0 Active Inactive Wound/Skin Impairment Nursing Diagnoses: Knowledge deficit related to ulceration/compromised skin integrity Goals: Patient/caregiver will verbalize  understanding of skin care regimen Date Initiated: 10/23/2021 Target Resolution Date: 11/23/2021 Goal Status: Active Ulcer/skin breakdown will have a volume reduction of 30% by week 4 Date Initiated: 10/23/2021 Target Resolution Date: 11/23/2021 Goal Status: Active Ulcer/skin breakdown will have a volume reduction of 50% by week 8 Date Initiated: 10/23/2021 Target Resolution Date: 12/23/2021 Goal Status: Active Ulcer/skin breakdown will have a volume reduction of 80% by week 12 Date Initiated: 10/23/2021 Target Resolution Date: 01/23/2022 Goal Status: Active Ulcer/skin breakdown will heal within 14 weeks Date Initiated: 10/23/2021 Target Resolution Date: 02/23/2022 Goal Status: Active Interventions: Assess patient/caregiver ability to obtain necessary supplies Assess patient/caregiver ability to perform ulcer/skin care regimen upon admission and as needed Assess ulceration(s) every visit Notes: Electronic Signature(s) Signed: 10/28/2021 6:56:11 PM By: Yevonne Pax RN Entered By: Yevonne Pax on 10/23/2021 09:33:58 Falck, Marc Morgans ([id number]) -------------------------------------------------------------------------------- Pain Assessment Details Patient Name: Stacie Sanders Date of Service: 10/23/2021 8:45 AM Medical Record Number: [id number] Patient Account Number: [id number] Date of Birth/Sex: [birth year] (50 y.o. Female) Treating RN: Yevonne Pax Primary Care Jaiven Graveline: Vincent Gros Other Clinician: Referring Simra Fiebig: Wallis Bamberg Treating Arrion Burruel/Extender: Tilda Franco in Treatment: 0 Active Problems Location of Pain Severity and Description of Pain Patient Has Paino Yes Site Locations With Dressing Change: Yes Duration of the Pain. Constant / Intermittento Intermittent Rate the pain. Current Pain Level: 2 Worst Pain Level: 10 Least Pain Level: 0 Tolerable Pain Level: 5 Character of Pain Describe the Pain: Aching, Burning Pain Management and  Medication Current Pain Management: Medication: Yes Cold Application: No Rest: Yes Massage: No Activity: No T.E.N.S.: No Heat Application: No Leg drop or elevation: No Is the Current Pain Management Adequate: Inadequate How does your wound impact your activities of daily livingo Sleep: Yes Bathing: No Appetite: No Relationship With Others: No Bladder Continence: No Emotions: No Bowel Continence: No Work: No Toileting: No Drive: No Dressing: No Hobbies: No Electronic Signature(s) Signed: 10/28/2021 6:56:11 PM By: Yevonne Pax RN Entered By: Yevonne Pax on 10/23/2021 09:02:14 Bialy, Marc Morgans ([id number]) -------------------------------------------------------------------------------- Patient/Caregiver Education Details Patient Name: Stacie Sanders Date of Service: 10/23/2021 8:45 AM Medical Record Number: [id number] Patient Account Number: [id number] Date of Birth/Gender: October 31, 1971 (50 y.o. Female) Treating RN: Yevonne Pax Primary Care Physician: Vincent Gros Other Clinician: Referring Physician: Wallis Bamberg Treating Physician/Extender: Tilda Franco in Treatment: 0 Education Assessment Education Provided To: Patient Education Topics Provided Wound/Skin Impairment: Methods: Explain/Verbal Responses: State content correctly Electronic Signature(s) Signed: 10/28/2021 6:56:11 PM By: Yevonne Pax RN Entered By: Yevonne Pax on 10/23/2021 09:52:53 Lavis, Marc Morgans ([id number]) -------------------------------------------------------------------------------- Wound Assessment Details Patient Name: Stacie Sanders Date of Service: 10/23/2021 8:45 AM Medical Record Number: [id number] Patient Account Number: [id number] Date of Birth/Sex: 1971-10-12 (50 y.o. Female) Treating RN: Yevonne Pax Primary Care Tameika Heckmann: Vincent Gros Other Clinician: Referring Israella Hubert:  MANI, MARIO Treating Bryann Gentz/Extender: Tilda Franco in Treatment: 0 Wound  Status Wound Number: 1 Primary Etiology: 2nd degree Burn Wound Location: Left Lower Leg Wound Status: Open Wounding Event: Thermal Burn Date Acquired: 09/30/2021 Weeks Of Treatment: 0 Clustered Wound: No Photos Wound Measurements Length: (cm) 0.6 Width: (cm) 0.7 Depth: (cm) 0.1 Area: (cm) 0.33 Volume: (cm) 0.033 % Reduction in Area: 0% % Reduction in Volume: 0% Epithelialization: None Tunneling: No Undermining: No Wound Description Classification: Full Thickness Without Exposed Support Structu Exudate Amount: Medium Exudate Type: Serosanguineous Exudate Color: red, brown res Foul Odor After Cleansing: No Slough/Fibrino Yes Wound Bed Granulation Amount: Medium (34-66%) Exposed Structure Granulation Quality: Red Fascia Exposed: No Necrotic Amount: Medium (34-66%) Fat Layer (Subcutaneous Tissue) Exposed: Yes Necrotic Quality: Adherent Slough Tendon Exposed: No Muscle Exposed: No Joint Exposed: No Bone Exposed: No Treatment Notes Wound #1 (Lower Leg) Wound Laterality: Left Cleanser Soap and Water Discharge Instruction: Gently cleanse wound with antibacterial soap, rinse and pat dry prior to dressing wounds Peri-Wound Care Behrle, Rowan Elbert Ewings ([id number]) Topical Silvadene Cream Discharge Instruction: Apply as directed by Taeshaun Rames. Primary Dressing Secondary Dressing Zetuvit Plus Silicone Non-bordered 5x5 (in/in) Secured With Compression Wrap Compression Stockings Add-Ons Electronic Signature(s) Signed: 10/23/2021 4:00:56 PM By: Yevonne Pax RN Entered By: Yevonne Pax on 10/23/2021 16:00:55 Crammer, Marc Morgans ([id number]) -------------------------------------------------------------------------------- Vitals Details Patient Name: Stacie Sanders Date of Service: 10/23/2021 8:45 AM Medical Record Number: [id number] Patient Account Number: [id number] Date of Birth/Sex: 1971-09-28 (50 y.o. Female) Treating RN: Yevonne Pax Primary Care Daiana Vitiello: Vincent Gros Other  Clinician: Referring Yanelie Abraha: Wallis Bamberg Treating Nikie Cid/Extender: Tilda Franco in Treatment: 0 Vital Signs Time Taken: 09:02 Temperature (F): 98.1 Height (in): 65 Pulse (bpm): 76 Source: Stated Respiratory Rate (breaths/min): 16 Weight (lbs): 167 Blood Pressure (mmHg): 127/74 Source: Stated Reference Range: 80 - 120 mg / dl Body Mass Index (BMI): 27.8 Electronic Signature(s) Signed: 10/28/2021 6:56:11 PM By: Yevonne Pax RN Entered By: Yevonne Pax on 10/23/2021 09:02:44
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
## 2021-10-30 ENCOUNTER — Other Ambulatory Visit: Payer: Self-pay

## 2021-10-30 ENCOUNTER — Encounter: Payer: 59 | Attending: Internal Medicine | Admitting: Internal Medicine

## 2021-10-30 DIAGNOSIS — T24232D Burn of second degree of left lower leg, subsequent encounter: Secondary | ICD-10-CM | POA: Diagnosis not present

## 2021-10-30 DIAGNOSIS — Z872 Personal history of diseases of the skin and subcutaneous tissue: Secondary | ICD-10-CM | POA: Diagnosis not present

## 2021-10-30 DIAGNOSIS — Z09 Encounter for follow-up examination after completed treatment for conditions other than malignant neoplasm: Secondary | ICD-10-CM | POA: Diagnosis not present

## 2021-10-30 NOTE — Progress Notes (Signed)
MARUA, QIN ([id number]) Visit Report for 10/30/2021 Chief Complaint Document Details Patient Name: [name], [name] Date of Service: 10/30/2021 1:45 PM Medical Record Number: [id number] Patient Account Number: [id number] Date of Birth/Sex: [birth year] (50 y.o. F) Treating RN: Hansel Feinstein Primary Care Provider: Vincent Gros Other Clinician: Referring Provider: Vincent Gros Treating Provider/Extender: Tilda Franco in Treatment: 1 Information Obtained from: Patient Chief Complaint Left lower extremity burn Electronic Signature(s) Signed: 10/30/2021 3:16:50 PM By: Geralyn Corwin DO Entered By: Geralyn Corwin on 10/30/2021 15:13:38 Rasco, Marc Morgans ([id number]) -------------------------------------------------------------------------------- HPI Details Patient Name: Stacie Sanders Date of Service: 10/30/2021 1:45 PM Medical Record Number: [id number] Patient Account Number: [id number] Date of Birth/Sex: [birth year] (50 y.o. F) Treating RN: Hansel Feinstein Primary Care Provider: Vincent Gros Other Clinician: Referring Provider: Vincent Gros Treating Provider/Extender: Tilda Franco in Treatment: 1 History of Present Illness HPI Description: Admission 10/23/2021 Stacie Sanders is a 50 year old female with a past medical history of bipolar 1 disorder and illicit substance abuse that presents to the clinic for a partial-thickness burn to her left lower extremity. In the beginning of the month patient suffered a burn while at work from a hose that produces hot water. She visited the ED 4 days later with worsening pain and redness and was treated for cellulitis with doxycycline. She has been using Silvadene to the area. She subsequently visited her primary care office on 10/18 and was placed on doxycycline again for cellulitis. She reports improvement in wound healing since the initial burn 3 weeks ago. She still has tenderness to the surrounding wound bed and  increased redness. She denies purulent drainage. She denies fever/chills. 11/2; patient's been using Silvadene. She did not take Keflex prescribed at last clinic visit. She has no issues or complaints today. She reports that the wound is healed. Electronic Signature(s) Signed: 10/30/2021 3:16:50 PM By: Geralyn Corwin DO Entered By: Geralyn Corwin on 10/30/2021 15:14:09 Fera, Marc Morgans ([id number]) -------------------------------------------------------------------------------- Physical Exam Details Patient Name: Stacie Sanders Date of Service: 10/30/2021 1:45 PM Medical Record Number: [id number] Patient Account Number: [id number] Date of Birth/Sex: 1971/02/04 (50 y.o. F) Treating RN: Hansel Feinstein Primary Care Provider: Vincent Gros Other Clinician: Referring Provider: Vincent Gros Treating Provider/Extender: Tilda Franco in Treatment: 1 Constitutional . Psychiatric . Notes Epithelialization to the previous wound site over the medial aspect of the left lower extremity. No signs of infection. Surrounding skin is intact. Electronic Signature(s) Signed: 10/30/2021 3:16:50 PM By: Geralyn Corwin DO Entered By: Geralyn Corwin on 10/30/2021 15:15:01 Hagemeister, Marc Morgans ([id number]) -------------------------------------------------------------------------------- Physician Orders Details Patient Name: Stacie Sanders Date of Service: 10/30/2021 1:45 PM Medical Record Number: [id number] Patient Account Number: [id number] Date of Birth/Sex: [birth year] (50 y.o. F) Treating RN: Hansel Feinstein Primary Care Provider: Vincent Gros Other Clinician: Referring Provider: Vincent Gros Treating Provider/Extender: Tilda Franco in Treatment: 1 Verbal / Phone Orders: No Diagnosis Coding Discharge From [facility] Services o Discharge from Wound Care Center Treatment Complete Additional Orders / Instructions o Follow Nutritious Diet and Increase Protein Intake o May return to  work at full capacity. o Other: - Moisture skin to protect it as it's newly healed. Electronic Signature(s) Signed: 10/30/2021 3:16:50 PM By: Geralyn Corwin DO Signed: 10/30/2021 4:37:25 PM By: Hansel Feinstein Entered By: Hansel Feinstein on 10/30/2021 14:33:39 Towers, Marc Morgans ([id number]) -------------------------------------------------------------------------------- Problem List Details Patient Name: Stacie Sanders Date of Service: 10/30/2021 1:45 PM Medical Record Number: [id number] Patient Account Number: [id number] Date of Birth/Sex: [birth year] (50 y.o. F) Treating RN:  Hansel Feinstein Primary Care Provider: Vincent Gros Other Clinician: Referring Provider: Vincent Gros Treating Provider/Extender: Tilda Franco in Treatment: 1 Active Problems ICD-10 Encounter Code Description Active Date MDM Diagnosis T24.232D Burn of second degree of left lower leg, subsequent encounter 10/23/2021 No Yes Inactive Problems Resolved Problems Electronic Signature(s) Signed: 10/30/2021 3:16:50 PM By: Geralyn Corwin DO Entered By: Geralyn Corwin on 10/30/2021 15:13:07 Vandoren, Marc Morgans ([id number]) -------------------------------------------------------------------------------- Progress Note Details Patient Name: Stacie Sanders Date of Service: 10/30/2021 1:45 PM Medical Record Number: [id number] Patient Account Number: [id number] Date of Birth/Sex: April 06, 1971 (50 y.o. F) Treating RN: Hansel Feinstein Primary Care Provider: Vincent Gros Other Clinician: Referring Provider: Vincent Gros Treating Provider/Extender: Tilda Franco in Treatment: 1 Subjective Chief Complaint Information obtained from Patient Left lower extremity burn History of Present Illness (HPI) Admission 10/23/2021 Stacie Sanders is a 50 year old female with a past medical history of bipolar 1 disorder and illicit substance abuse that presents to the clinic for a partial-thickness burn to her left lower  extremity. In the beginning of the month patient suffered a burn while at work from a hose that produces hot water. She visited the ED 4 days later with worsening pain and redness and was treated for cellulitis with doxycycline. She has been using Silvadene to the area. She subsequently visited her primary care office on 10/18 and was placed on doxycycline again for cellulitis. She reports improvement in wound healing since the initial burn 3 weeks ago. She still has tenderness to the surrounding wound bed and increased redness. She denies purulent drainage. She denies fever/chills. 11/2; patient's been using Silvadene. She did not take Keflex prescribed at last clinic visit. She has no issues or complaints today. She reports that the wound is healed. Patient History Information obtained from Patient. Social History Current every day smoker, Marital Status - Single, Alcohol Use - Moderate, Drug Use - No History, Caffeine Use - Daily. Objective Constitutional Vitals Time Taken: 2:09 PM, Height: 65 in, Weight: 167 lbs, BMI: 27.8, Temperature: 98.2 F, Pulse: 81 bpm, Respiratory Rate: 16 breaths/min, Blood Pressure: 133/83 mmHg. General Notes: Epithelialization to the previous wound site over the medial aspect of the left lower extremity. No signs of infection. Surrounding skin is intact. Integumentary (Hair, Skin) Wound #1 status is Healed - Epithelialized. Original cause of wound was Thermal Burn. The date acquired was: 09/30/2021. The wound has been in treatment 1 weeks. The wound is located on the Left Lower Leg. The wound measures 0cm length x 0cm width x 0cm depth; 0cm^2 area and 0cm^3 volume. There is a none present amount of drainage noted. There is no granulation within the wound bed. There is no necrotic tissue within the wound bed. Assessment Active Problems ICD-10 Burn of second degree of left lower leg, subsequent encounter Stacie Sanders, Stacie L. ([id number]) Patient's wound is healed  with the use of Silvadene. She may return to work without restrictions. She knows to call with any questions or concerns. She can follow-up as needed. Plan Discharge From [facility] Services: Discharge from Wound Care Center Treatment Complete Additional Orders / Instructions: Follow Nutritious Diet and Increase Protein Intake May return to work at full capacity. Other: - Moisture skin to protect it as it's newly healed. 1. Discharge from clinic due to closed wound 2. Follow-up as needed 3. 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 F) Treating RN: Hansel Feinstein Primary Care Provider: Vincent Gros Other Clinician: Referring Provider: Vincent Gros Treating Provider/Extender: Tilda Franco in Treatment: 1 Information Obtained From Patient Immunizations Pneumococcal Vaccine: Received Pneumococcal Vaccination: No Implantable Devices None Family and Social History Current every day smoker; Marital Status - Single; Alcohol Use: Moderate; Drug Use: No History; Caffeine Use: Daily; Financial Concerns: No; Food, Clothing or Shelter Needs: No; Support System Lacking: No; Transportation Concerns: No Electronic Signature(s) Signed: 10/30/2021 3:16:50 PM By: Geralyn Corwin DO Signed: 10/30/2021 4:37:25 PM By: Hansel Feinstein Entered By: Geralyn Corwin on 10/30/2021 15:14:17 Bumpus, Marc Morgans ([id number]) -------------------------------------------------------------------------------- SuperBill Details Patient Name: Stacie Sanders Date of Service: 10/30/2021 Medical  Record Number: [id number] Patient Account Number: [id number] Date of Birth/Sex: March 13, 1971 (50 y.o. F) Treating RN: Hansel Feinstein Primary Care Provider: Vincent Gros Other Clinician: Referring Provider: Vincent Gros Treating Provider/Extender: Tilda Franco in Treatment: 1 Diagnosis Coding ICD-10 Codes Code Description T24.232D Burn of second degree of left lower leg, subsequent encounter Facility Procedures CPT4 Code: 12458099 Description: [id number] - WOUND CARE VISIT-LEV 2 EST PT Modifier: Quantity: 1 Physician Procedures CPT4 Code: 5053976 Description: 99213 - WC PHYS LEVEL 3 - EST PT Modifier: Quantity: 1 CPT4 Code: Description: ICD-10 Diagnosis Description T24.232D Burn of second degree of left lower leg, subsequent encounter Modifier: Quantity: Electronic Signature(s) Signed: 10/30/2021 3:16:50 PM By: Geralyn Corwin DO Entered By: Geralyn Corwin on 10/30/2021 15:16:22

## 2021-10-30 NOTE — Progress Notes (Signed)
Sanders, Stacie ([id number]) Visit Report for 10/30/2021 Arrival Information Details Patient Name: Stacie, Sanders Date of Service: 10/30/2021 1:45 PM Medical Record Number: [id number] Patient Account Number: [id number] Date of Birth/Sex: Mar 02, 1971 (50 y.o. F) Treating RN: Hansel Feinstein Primary Care Destane Speas: Vincent Gros Other Clinician: Referring Kazim Corrales: Vincent Gros Treating Besan Ketchem/Extender: Tilda Franco in Treatment: 1 Visit Information History Since Last Visit Added or deleted any medications: No Patient Arrived: Ambulatory Had a fall or experienced change in No Arrival Time: 14:07 activities of daily living that may affect Accompanied By: self risk of falls: Transfer Assistance: None Hospitalized since last visit: No Patient Identification Verified: Yes Has Dressing in Place as Prescribed: Yes Secondary Verification Process Completed: Yes Pain Present Now: No Patient Requires Transmission-Based Precautions: No Patient Has Alerts: No Electronic Signature(s) Signed: 10/30/2021 4:37:25 PM By: Hansel Feinstein Entered By: Hansel Feinstein on 10/30/2021 14:08:33 Gunther, Marc Morgans ([id number]) -------------------------------------------------------------------------------- Clinic Level of Care Assessment Details Patient Name: Stacie Sanders Date of Service: 10/30/2021 1:45 PM Medical Record Number: [id number] Patient Account Number: [id number] Date of Birth/Sex: 1971-11-23 (50 y.o. F) Treating RN: Hansel Feinstein Primary Care Satomi Buda: Vincent Gros Other Clinician: Referring Jenese Mischke: Vincent Gros Treating Lareina Espino/Extender: Tilda Franco in Treatment: 1 Clinic Level of Care Assessment Items TOOL 4 Quantity Score []  - Use when only an EandM is performed on FOLLOW-UP visit 0 ASSESSMENTS - Nursing Assessment / Reassessment []  - Reassessment of Co-morbidities (includes updates in patient status) 0 []  - 0 Reassessment of Adherence to Treatment Plan ASSESSMENTS -  Wound and Skin Assessment / Reassessment X - Simple Wound Assessment / Reassessment - one wound 1 5 []  - 0 Complex Wound Assessment / Reassessment - multiple wounds []  - 0 Dermatologic / Skin Assessment (not related to wound area) ASSESSMENTS - Focused Assessment []  - Circumferential Edema Measurements - multi extremities 0 []  - 0 Nutritional Assessment / Counseling / Intervention []  - 0 Lower Extremity Assessment (monofilament, tuning fork, pulses) []  - 0 Peripheral Arterial Disease Assessment (using hand held doppler) ASSESSMENTS - Ostomy and/or Continence Assessment and Care []  - Incontinence Assessment and Management 0 []  - 0 Ostomy Care Assessment and Management (repouching, etc.) PROCESS - Coordination of Care X - Simple Patient / Family Education for ongoing care 1 15 []  - 0 Complex (extensive) Patient / Family Education for ongoing care X- 1 10 Staff obtains Consents, Records, Test Results / Process Orders []  - 0 Staff telephones HHA, Nursing Homes / Clarify orders / etc []  - 0 Routine Transfer to another Facility (non-emergent condition) []  - 0 Routine Hospital Admission (non-emergent condition) []  - 0 New Admissions / / Ordering NPWT, Apligraf, etc. []  - 0 Emergency Hospital Admission (emergent condition) X- 1 10 Simple Discharge Coordination []  - 0 Complex (extensive) Discharge Coordination PROCESS - Special Needs []  - Pediatric / Minor Patient Management 0 []  - 0 Isolation Patient Management []  - 0 Hearing / Language / Visual special needs []  - 0 Assessment of Community assistance (transportation, D/C planning, etc.) []  - 0 Additional assistance / Altered mentation []  - 0 Support Surface(s) Assessment (bed, cushion, seat, etc.) INTERVENTIONS - Wound Cleansing / Measurement Holford, Ana L. ( ) X- 1 5 Simple Wound Cleansing - one wound []  - 0 Complex Wound Cleansing - multiple wounds X- 1 5 Wound Imaging (photographs  - any number of wounds) []  - 0 Wound Tracing (instead of photographs) X- 1 5 Simple Wound Measurement - one wound []  - 0 Complex Wound Measurement -  multiple wounds INTERVENTIONS - Wound Dressings X - Small Wound Dressing one or multiple wounds 1 10 []  - 0 Medium Wound Dressing one or multiple wounds []  - 0 Large Wound Dressing one or multiple wounds []  - 0 Application of Medications - topical []  - 0 Application of Medications - injection INTERVENTIONS - Miscellaneous []  - External ear exam 0 []  - 0 Specimen Collection (cultures, biopsies, blood, body fluids, etc.) []  - 0 Specimen(s) / Culture(s) sent or taken to Lab for analysis []  - 0 Patient Transfer (multiple staff / / Similar devices) []  - 0 Simple Staple / Suture removal (25 or less) []  - 0 Complex Staple / Suture removal (26 or more) []  - 0 Hypo / Hyperglycemic Management (close monitor of Blood Glucose) []  - 0 Ankle / Brachial Index (ABI) - do not check if billed separately X- 1 5 Vital Signs Has the patient been seen at the hospital within the last three years: Yes Total Score: 70 Level Of Care: New/Established - Level 2 Electronic Signature(s) Signed: 10/30/2021 4:37:25 PM By: Entered By: on 10/30/2021 14:38:21 Friedli, ( ) -------------------------------------------------------------------------------- Encounter Discharge Information Details Patient Name: Date of Service: 10/30/2021 1:45 PM Medical Record Number: Patient Account Number: Date of Birth/Sex: 05/04/1971 (50 y.o. F) Treating RN: 13/01/2021 Primary Care Samora Jernberg: Hansel Feinstein Other Clinician: Referring Lorrene Graef: Hansel Feinstein Treating Lillis Nuttle/Extender: 13/01/2021 in Treatment: 1 Encounter Discharge Information Items Discharge Condition: Stable Ambulatory Status: Ambulatory Discharge Destination: Home Transportation: Private  Auto Accompanied By: self Schedule Follow-up Appointment: No Clinical Summary of Care: Electronic Signature(s) Signed: 10/30/2021 4:37:25 PM By: [id number] Entered By: Stacie Sanders on 10/30/2021 14:40:23 Torti, [id number] ([id number]) -------------------------------------------------------------------------------- Lower Extremity Assessment Details Patient Name: 02/23/1971 Date of Service: 10/30/2021 1:45 PM Medical Record Number: Hansel Feinstein Patient Account Number: Vincent Gros Date of Birth/Sex: 05/29/1971 (50 y.o. F) Treating RN: 13/01/2021 Primary Care Buford Gayler: Hansel Feinstein Other Clinician: Referring Wing Gfeller: Hansel Feinstein Treating Jayion Schneck/Extender: 13/01/2021 in Treatment: 1 Edema Assessment Assessed: [Left: Yes] [Right: No] Edema: [Left: N] [Right: o] Calf Left: Right: Point of Measurement: 32 cm From Medial Instep 37 cm Ankle Left: Right: Point of Measurement: 10 cm From Medial Instep 20 cm Knee To Floor Left: Right: From Medial Instep 45 cm Vascular Assessment Pulses: Dorsalis Pedis Palpable: [Left:Yes] Electronic Signature(s) Signed: 10/30/2021 4:37:25 PM By: [id number] Entered By: Stacie Sanders on 10/30/2021 14:12:09 Ciulla, [id number] ([id number]) -------------------------------------------------------------------------------- Multi Wound Chart Details Patient Name: 02/23/1971 Date of Service: 10/30/2021 1:45 PM Medical Record Number: Hansel Feinstein Patient Account Number: Vincent Gros Date of Birth/Sex: 1971-10-28 (50 y.o. F) Treating RN: 13/01/2021 Primary Care Malita Ignasiak: Hansel Feinstein Other Clinician: Referring Tamzin Bertling: Hansel Feinstein Treating Khloee Garza/Extender: 13/01/2021 in Treatment: 1 Vital Signs Height(in): 65 Pulse(bpm): 81 Weight(lbs): 167 Blood Pressure(mmHg): 133/83 Body Mass Index(BMI): 28 Temperature(F): 98.2 Respiratory Rate(breaths/min): 16 Photos: [N/A:N/A] Wound Location: Left Lower Leg N/A N/A Wounding  Event: Thermal Burn N/A N/A Primary Etiology: 2nd degree Burn N/A N/A Date Acquired: 09/30/2021 N/A N/A Weeks of Treatment: 1 N/A N/A Wound Status: Healed - Epithelialized N/A N/A Measurements L x W x D (cm) 0x0x0 N/A N/A Area (cm) : 0 N/A N/A Volume (cm) : 0 N/A N/A % Reduction in Area: 100.00% N/A N/A % Reduction in Volume: 100.00% N/A N/A Classification: Full Thickness Without Exposed N/A N/A Support Structures Exudate Amount: None Present N/A N/A Granulation Amount: None Present (0%) N/A  N/A Necrotic Amount: None Present (0%) N/A N/A Exposed Structures: Fascia: No N/A N/A Fat Layer (Subcutaneous Tissue): No Tendon: No Muscle: No Joint: No Bone: No Epithelialization: Large (67-100%) N/A N/A Treatment Notes Electronic Signature(s) Signed: 10/30/2021 3:16:50 PM By: Geralyn Corwin DO Entered By: Geralyn Corwin on 10/30/2021 15:13:23 Kimble, Marc Morgans ([id number]) -------------------------------------------------------------------------------- Multi-Disciplinary Care Plan Details Patient Name: Stacie Sanders Date of Service: 10/30/2021 1:45 PM Medical Record Number: [id number] Patient Account Number: [id number] Date of Birth/Sex: 1971/04/21 (50 y.o. F) Treating RN: Hansel Feinstein Primary Care Lirio Bach: Vincent Gros Other Clinician: Referring Arleen Bar: Vincent Gros Treating Karrington Studnicka/Extender: Tilda Franco in Treatment: 1 Active Inactive Electronic Signature(s) Signed: 10/30/2021 4:37:25 PM By: Hansel Feinstein Entered By: Hansel Feinstein on 10/30/2021 14:12:27 Hartline, Marc Morgans ([id number]) -------------------------------------------------------------------------------- Pain Assessment Details Patient Name: Stacie Sanders Date of Service: 10/30/2021 1:45 PM Medical Record Number: [id number] Patient Account Number: [id number] Date of Birth/Sex: 04-24-1971 (50 y.o. F) Treating RN: Hansel Feinstein Primary Care Tereza Gilham: Vincent Gros Other Clinician: Referring Jalisa Sacco:  Vincent Gros Treating Manuel Dall/Extender: Tilda Franco in Treatment: 1 Active Problems Location of Pain Severity and Description of Pain Patient Has Paino No Site Locations Rate the pain. Current Pain Level: 0 Pain Management and Medication Current Pain Management: Electronic Signature(s) Signed: 10/30/2021 4:37:25 PM By: Hansel Feinstein Entered By: Hansel Feinstein on 10/30/2021 14:10:13 Wolf, Marc Morgans ([id number]) -------------------------------------------------------------------------------- Patient/Caregiver Education Details Patient Name: Stacie Sanders Date of Service: 10/30/2021 1:45 PM Medical Record Number: [id number] Patient Account Number: [id number] Date of Birth/Gender: 1971-05-02 (50 y.o. F) Treating RN: Hansel Feinstein Primary Care Physician: Vincent Gros Other Clinician: Referring Physician: Vincent Gros Treating Physician/Extender: Tilda Franco in Treatment: 1 Education Assessment Education Provided To: Patient Education Topics Provided Basic Hygiene: Electronic Signature(s) Signed: 10/30/2021 4:37:25 PM By: Hansel Feinstein Entered By: Hansel Feinstein on 10/30/2021 14:39:41 Mckeehan, Marc Morgans ([id number]) -------------------------------------------------------------------------------- Wound Assessment Details Patient Name: Stacie Sanders Date of Service: 10/30/2021 1:45 PM Medical Record Number: [id number] Patient Account Number: [id number] Date of Birth/Sex: Jun 01, 1971 (50 y.o. F) Treating RN: Hansel Feinstein Primary Care Elizabelle Fite: Vincent Gros Other Clinician: Referring Michael Ventresca: Vincent Gros Treating Mihir Flanigan/Extender: Tilda Franco in Treatment: 1 Wound Status Wound Number: 1 Primary Etiology: 2nd degree Burn Wound Location: Left Lower Leg Wound Status: Healed - Epithelialized Wounding Event: Thermal Burn Date Acquired: 09/30/2021 Weeks Of Treatment: 1 Clustered Wound: No Photos Wound Measurements Length: (cm) 0 Width: (cm)  0 Depth: (cm) 0 Area: (cm) 0 Volume: (cm) 0 % Reduction in Area: 100% % Reduction in Volume: 100% Epithelialization: Large (67-100%) Wound Description Classification: Full Thickness Without Exposed Support Structure Exudate Amount: None Present s Foul Odor After Cleansing: No Slough/Fibrino No Wound Bed Granulation Amount: None Present (0%) Exposed Structure Necrotic Amount: None Present (0%) Fascia Exposed: No Fat Layer (Subcutaneous Tissue) Exposed: No Tendon Exposed: No Muscle Exposed: No Joint Exposed: No Bone Exposed: No Electronic Signature(s) Signed: 10/30/2021 4:37:25 PM By: Hansel Feinstein Entered By: Hansel Feinstein on 10/30/2021 14:11:01 Christen, Marc Morgans ([id number]) -------------------------------------------------------------------------------- Vitals Details Patient Name: Stacie Sanders Date of Service: 10/30/2021 1:45 PM Medical Record Number: [id number] Patient Account Number: [id number] Date of Birth/Sex: 1970/12/31 (50 y.o. F) Treating RN: Hansel Feinstein Primary Care Neima Lacross: Vincent Gros Other Clinician: Referring Ashar Lewinski: Vincent Gros Treating Chanee Henrickson/Extender: Tilda Franco in Treatment: 1 Vital Signs Time Taken: 14:09 Temperature (F): 98.2 Height (in): 65 Pulse (bpm): 81 Weight (lbs): 167 Respiratory Rate (breaths/min): 16 Body Mass Index (BMI): 27.8 Blood Pressure (mmHg): 133/83 Reference Range: 80 -  120 mg / dl Electronic Signature(s) Signed: 10/30/2021 4:37:25 PM By: Hansel Feinstein Entered By: Hansel Feinstein on 10/30/2021 14:09:37

## 2021-11-06 ENCOUNTER — Encounter: Payer: 59 | Admitting: Internal Medicine

## 2021-11-13 ENCOUNTER — Ambulatory Visit: Payer: Self-pay | Admitting: Obstetrics and Gynecology

## 2021-11-13 ENCOUNTER — Ambulatory Visit: Payer: PRIVATE HEALTH INSURANCE | Admitting: Internal Medicine

## 2021-11-14 ENCOUNTER — Ambulatory Visit: Payer: Self-pay | Admitting: Obstetrics and Gynecology

## 2021-11-20 ENCOUNTER — Other Ambulatory Visit: Payer: Self-pay

## 2021-11-20 DIAGNOSIS — F5105 Insomnia due to other mental disorder: Secondary | ICD-10-CM

## 2021-11-20 DIAGNOSIS — F409 Phobic anxiety disorder, unspecified: Secondary | ICD-10-CM

## 2021-11-20 MED ORDER — TRAZODONE HCL 100 MG PO TABS: 100.0000 mg | ORAL_TABLET | Freq: Every evening | ORAL | 0 refills | Status: DC | PRN

## 2021-11-22 ENCOUNTER — Other Ambulatory Visit: Payer: Self-pay

## 2021-11-22 ENCOUNTER — Ambulatory Visit
Admission: EM | Admit: 2021-11-22 | Discharge: 2021-11-22 | Disposition: A | Discharge status: Home / Self Care | Payer: PRIVATE HEALTH INSURANCE

## 2021-11-23 ENCOUNTER — Ambulatory Visit
Admission: RE | Admit: 2021-11-23 | Discharge: 2021-11-23 | Disposition: A | Discharge status: Home / Self Care | Payer: 59 | Source: Ambulatory Visit | Attending: Physician Assistant | Admitting: Physician Assistant

## 2021-11-23 VITALS — BP 116/76 | HR 77 | Temp 97.7°F | Resp 18

## 2021-11-23 DIAGNOSIS — M25532 Pain in left wrist: Secondary | ICD-10-CM | POA: Diagnosis not present

## 2021-11-23 MED ORDER — PREDNISONE 20 MG PO TABS: 40.0000 mg | ORAL_TABLET | Freq: Every day | ORAL | 0 refills | Status: AC
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
## 2021-11-23 NOTE — ED Triage Notes (Signed)
One week h/o left wrist pain. Pt also complains of left digit stiffness.  Pain is interfering with her sleep. Notes pain with ROM and difficulty making a fist. Has been wearing wrist brace and taking tylenol and ibuprofen with some relief. No falls or injuries. Notes h/o neuropathy.

## 2021-11-25 ENCOUNTER — Telehealth: Payer: Self-pay | Admitting: Nurse Practitioner

## 2021-11-25 NOTE — Telephone Encounter (Signed)
Patient was seen at a [facility] urgent care on 11/23/2021 for wrist pain. She is requesting a note for work stating she has limitations at work due to her wrist pain. She states urgent care was unable to give her the note.   AS, CMA

## 2021-11-25 NOTE — Telephone Encounter (Signed)
Patient made aware of the below. Patient was upset that we would not provide a note and was somewhat rude. Patient disconnected the call. AS, CMA

## 2021-11-25 NOTE — Telephone Encounter (Signed)
I feel like if urgent care did not write her for limitations, I cannot either without a physical visit to the office. And really, she should see orthopedics to discuss limitations. I would also need to see the records and X-rays which were done at urgent care. I am unable to write a note like that without other information.

## 2021-11-25 NOTE — Telephone Encounter (Signed)
There is actually a letter written stating that she was seen o n11/28/2022 and that she could go back to work on 11/26/2021. There are no restrictions or limitations. She is on prednisone taper. We could print that leter and give it to her of she is unable to access this on her MyChart. It is part of the visit note from urgent care.

## 2021-12-12 ENCOUNTER — Other Ambulatory Visit: Payer: Self-pay

## 2021-12-12 DIAGNOSIS — F409 Phobic anxiety disorder, unspecified: Secondary | ICD-10-CM

## 2021-12-12 MED ORDER — TRAZODONE HCL 100 MG PO TABS: 100.0000 mg | ORAL_TABLET | Freq: Every evening | ORAL | 0 refills | Status: DC | PRN

## 2022-01-08 ENCOUNTER — Ambulatory Visit (INDEPENDENT_AMBULATORY_CARE_PROVIDER_SITE_OTHER): Payer: 59 | Admitting: Nurse Practitioner

## 2022-01-08 ENCOUNTER — Encounter: Payer: Self-pay | Admitting: Nurse Practitioner

## 2022-01-08 VITALS — Wt 171.7 lb

## 2022-01-08 DIAGNOSIS — R197 Diarrhea, unspecified: Secondary | ICD-10-CM | POA: Insufficient documentation

## 2022-01-08 DIAGNOSIS — A084 Viral intestinal infection, unspecified: Secondary | ICD-10-CM | POA: Insufficient documentation

## 2022-01-08 DIAGNOSIS — R112 Nausea with vomiting, unspecified: Secondary | ICD-10-CM | POA: Diagnosis not present

## 2022-01-08 MED ORDER — DIPHENOXYLATE-ATROPINE 2.5-0.025 MG PO TABS: 1.0000 | ORAL_TABLET | Freq: Three times a day (TID) | ORAL | 0 refills | Status: DC | PRN

## 2022-01-08 MED ORDER — ONDANSETRON 4 MG PO TBDP: 4.0000 mg | ORAL_TABLET | Freq: Three times a day (TID) | ORAL | 0 refills | Status: DC | PRN

## 2022-01-08 NOTE — Progress Notes (Signed)
Virtual Visit via Telephone Note  I connected with Stacie Sanders on 01/08/22 at  1:10 PM EST by telephone and verified that I am speaking with the correct person using two identifiers.  Location: Patient: home  Provider: [facility]     I discussed the limitations, risks, security and privacy concerns of performing an evaluation and management service by telephone and the availability of in person appointments. I also discussed with the patient that there may be a patient responsible charge related to this service. The patient expressed understanding and agreed to proceed.   History of Present Illness: The patent states that she has been having nausea with vomiting and diarrhea since yesterday. She states that symptoms became mch worse this morning. She states that she had several episodes of diarrhea. Was vomiting at the same time. She states that after episodes this morning, the left side of her throat stated to hurt. She states that she has a headache. She denies fever. She states that symptoms were so severe, she had to miss work this morning. States that she just started a new job and will need a note which states that she was seen by provider due to new illness.    Observations/Objective:  The patient is alert and oriented. She is pleasant and answers all questions appropriately. Breathing is non-labored. She is in no acute distress at this time.  She sounds like she does not feel well.   Today's Vitals   01/08/22 1301  Weight: 171 lb 11.2 oz (77.9 kg)   Body mass index is 28.57 kg/m.   Assessment and Plan: 1. Viral gastroenteritis The 'BRAT' diet is suggested, then progress to diet as tolerated as symptoms abate. Advised patient to call if she  has persistent diarrhea, vomiting, fever or abdominal pain.   2. Diarrhea, unspecified type May take lomotil up to three times daily as needed for acute diarrhea. Discussed that  there is relationship  between fenatyl and lomotil. Advised her to take this medication with caution. Is she develops itching or hives, she should stop the medication, take benadryl, and contact the office. She voiced understanding and agreement. Asked to still try the medication despite the possibility of reaction.  - diphenoxylate-atropine (LOMOTIL) 2.5-0.025 MG tablet; Take 1 tablet by mouth 3 (three) times daily as needed for diarrhea or loose stools.  Dispense: 30 tablet; Refill: 0  3. Nausea and vomiting, unspecified vomiting type May take zofran up to three times dally as needed. The 'BRAT' diet is suggested, then progress to diet as tolerated as symptoms abate. - ondansetron (ZOFRAN-ODT) 4 MG disintegrating tablet; Take 1 tablet (4 mg total) by mouth every 8 (eight) hours as needed for nausea or vomiting.  Dispense: 30 tablet; Refill: 0   Follow Up Instructions:    I discussed the assessment and treatment plan with the patient. The patient was provided an opportunity to ask questions and all were answered. The patient agreed with the plan and demonstrated an understanding of the instructions.   The patient was advised to call back or seek an in-person evaluation if the symptoms worsen or if the condition fails to improve as anticipated.  I provided 10 minutes of non-face-to-face time during this encounter.   Carlean Jews, NP

## 2022-01-13 ENCOUNTER — Telehealth: Payer: Self-pay | Admitting: Nurse Practitioner

## 2022-01-14 ENCOUNTER — Ambulatory Visit (INDEPENDENT_AMBULATORY_CARE_PROVIDER_SITE_OTHER): Payer: 59 | Admitting: Nurse Practitioner

## 2022-01-14 ENCOUNTER — Encounter: Payer: Self-pay | Admitting: Nurse Practitioner

## 2022-01-14 ENCOUNTER — Other Ambulatory Visit: Payer: Self-pay

## 2022-01-14 VITALS — BP 106/66 | HR 70 | Temp 97.9°F | Ht 65.0 in | Wt 182.2 lb

## 2022-01-14 DIAGNOSIS — F319 Bipolar disorder, unspecified: Secondary | ICD-10-CM | POA: Diagnosis not present

## 2022-01-14 DIAGNOSIS — F411 Generalized anxiety disorder: Secondary | ICD-10-CM

## 2022-01-14 DIAGNOSIS — F5105 Insomnia due to other mental disorder: Secondary | ICD-10-CM | POA: Diagnosis not present

## 2022-01-14 DIAGNOSIS — F409 Phobic anxiety disorder, unspecified: Secondary | ICD-10-CM | POA: Diagnosis not present

## 2022-01-14 MED ORDER — HYDROXYZINE HCL 50 MG PO TABS: 50.0000 mg | ORAL_TABLET | Freq: Three times a day (TID) | ORAL | 1 refills | Status: DC | PRN

## 2022-01-14 MED ORDER — TRAZODONE HCL 100 MG PO TABS: 100.0000 mg | ORAL_TABLET | Freq: Every evening | ORAL | 0 refills | Status: DC | PRN

## 2022-01-14 MED ORDER — OLANZAPINE 5 MG PO TABS: 5.0000 mg | ORAL_TABLET | Freq: Every day | ORAL | 1 refills | Status: DC

## 2022-01-14 NOTE — Progress Notes (Signed)
Established Patient Office Visit  Subjective:  Patient ID: Stacie Sanders, female    DOB: [birth year]  Age: 51 y.o. MRN: [id number]  CC:  Chief Complaint  Patient presents with   Medication Refill    HPI Stacie Sanders presents for follow up visit. She recently started a new job. She states that she is very irritable. Has bad mood swings. She is having trouble with concentration. She has a hard time getting along with others. Takes criticism very personally. Feels like all negative comments are directed at her. States that she is very emotional. States that she is very emotional. She states that she is not a person that others want to be around right now. They don't want to be around her because they don't know what to expect from her. Has been on mood stabilizer in the past. Used to see psychiatry. She does not have a vehicle and is unable to get anywhere but from home to work then back again.  Patient denies feeling suicidal.  She does not want hurt herself or anyone else. The patient states she is concerned about menopause.  States she is waking up several times each night with night sweats and feeling very hot.  She is concerned that some of the symptoms are related to menopause.  Past Medical History:  Diagnosis Date   Allergic rhinitis    Bipolar 1 disorder ([place])    Borderline diabetes mellitus    Colon polyp    Depression    Dyspnea on exertion    Esophageal stricture    scheduled for EGD and dilation 03-18-2016   GAD (generalized anxiety disorder)    History of acute respiratory failure    12-29-2015  due to polysubstance overdose--  pt intubated --  resolved    History of febrile seizure    x1  01/ 2017--  none since   History of kidney stones    staghorn   History of suicide attempt    12-29-2015  -- overdose klonopin, xanax, alcohol--  acute respirtory failure and acute encenphalopathy -- both resolved   IBS (irritable bowel syndrome)    Irregular menstrual cycle     Renal calculi    bilateral    Past Surgical History:  Procedure Laterality Date   COLONOSCOPY     CYSTOSCOPY WITH STENT PLACEMENT Bilateral 03/10/2016   Procedure: CYSTOSCOPY WITH STENT PLACEMENT;  Surgeon: Cleon Gustin, MD;  Location: [facility];  Service: Urology;  Laterality: Bilateral;   CYSTOSCOPY/RETROGRADE/URETEROSCOPY/STONE EXTRACTION WITH BASKET Bilateral 03/10/2016   Procedure: CYSTOSCOPY/RETROGRADE/URETEROSCOPY/STONE EXTRACTION WITH BASKET;  Surgeon: Cleon Gustin, MD;  Location: [facility];  Service: Urology;  Laterality: Bilateral;   EXTRACORPOREAL SHOCK WAVE LITHOTRIPSY  x2     PERCUTANEOUS NEPHROSTOLITHOTOMY Right 09-18-2005   staghorn   TUBAL LIGATION  1999    Family History  Problem Relation Age of Onset   Cancer Father        pancreas   Melanoma Father    Cancer Maternal Grandmother        lung   COPD Paternal Grandmother    Melanoma Paternal Grandmother     Social History   Socioeconomic History   Marital status: Married    Spouse name: Not on file   Number of children: Not on file   Years of education: Not on file   Highest education level: Not on file  Occupational History   Not on file  Tobacco Use   Smoking  status: Every Day    Packs/day: 0.50    Years: 20.00    Pack years: 10.00    Types: Cigarettes   Smokeless tobacco: Never  Vaping Use   Vaping Use: Never used  Substance and Sexual Activity   Alcohol use: Yes    Comment: occ   Drug use: No   Sexual activity: Yes    Birth control/protection: Surgical  Other Topics Concern   Not on file  Social History Narrative   Not on file   Social Determinants of Health   Financial Resource Strain: Not on file  Food Insecurity: Not on file  Transportation Needs: Not on file  Physical Activity: Not on file  Stress: Not on file  Social Connections: Not on file  Intimate Partner Violence: Not on file    Outpatient Medications Prior to Visit   Medication Sig Dispense Refill   diphenoxylate-atropine (LOMOTIL) 2.5-0.025 MG tablet Take 1 tablet by mouth 3 (three) times daily as needed for diarrhea or loose stools. 30 tablet 0   EPINEPHrine (EPIPEN 2-PAK) 0.3 mg/0.3 mL IJ SOAJ injection Inject 0.3 mg into the muscle as needed for anaphylaxis. 1 each 1   gabapentin (NEURONTIN) 300 MG capsule Take 900 mg by mouth 3 (three) times daily.     Gauze Pads & Dressings (BAND-AID KLING ROLLED GAUZE LG) MISC Apply rolled gauze over clean bandage twice daily after cleaning wound. 5 each 1   ibuprofen (ADVIL,MOTRIN) 600 MG tablet Take 1 tablet (600 mg total) by mouth every 8 (eight) hours as needed. 30 tablet 2   lidocaine (XYLOCAINE) 2 % solution Use as directed 10 mLs in the mouth or throat as needed. 100 mL 0   Multiple Vitamin (MULTIVITAMIN WITH MINERALS) TABS tablet Take 1 tablet by mouth daily.     ondansetron (ZOFRAN-ODT) 4 MG disintegrating tablet Take 1 tablet (4 mg total) by mouth every 8 (eight) hours as needed for nausea or vomiting. 30 tablet 0   silver sulfADIAZINE (SILVADENE) 1 % cream Apply 1 application topically daily. 400 g 0   Wound Dressings (VASELINE PETROLATUM GAUZE) PADS Apply over clean wound twice daily 100 each 1   traZODone (DESYREL) 100 MG tablet Take 1 tablet (100 mg total) by mouth at bedtime as needed and may repeat dose one time if needed for sleep. 30 tablet 0   azithromycin (ZITHROMAX) 250 MG tablet z-pack - take as directed for 5 days 6 tablet 0   cetirizine (ZYRTEC) 10 MG chewable tablet Chew 1 tablet (10 mg total) by mouth daily. 20 tablet 0   doxycycline (VIBRAMYCIN) 100 MG capsule Take 1 capsule (100 mg total) by mouth 2 (two) times daily. (Patient not taking: Reported on 01/14/2022) 20 capsule 0   Gauze Pads & Dressings (CURAD GAUZE) 4"X4" PADS Apply over vaseline guaze twice daily due to burn 100 each 1   hydrOXYzine (ATARAX/VISTARIL) 50 MG tablet Take 1 tablet (50 mg total) by mouth 3 (three) times daily as  needed for anxiety. 30 tablet 2   pregabalin (LYRICA) 75 MG capsule Take 1 capsule (75 mg total) by mouth 2 (two) times daily. Needs appt for Refill (Patient not taking: Reported on 10/15/2021) 60 capsule 0   No facility-administered medications prior to visit.    Allergies  Allergen Reactions   Other Anaphylaxis    Fire ants   Ed-Apap [Acetaminophen]    Fentanyl Hives and Itching   Percocet [Oxycodone-Acetaminophen] Hives and Itching   Robaxin [Methocarbamol] Rash    ROS  Review of Systems  Constitutional:  Positive for fatigue. Negative for activity change, appetite change, chills and fever.  HENT:  Negative for congestion, postnasal drip, rhinorrhea, sinus pressure, sinus pain, sneezing and sore throat.   Eyes: Negative.   Respiratory:  Negative for cough, chest tightness, shortness of breath and wheezing.   Cardiovascular:  Negative for chest pain and palpitations.  Gastrointestinal:  Negative for abdominal pain, constipation, diarrhea, nausea and vomiting.  Endocrine: Positive for heat intolerance. Negative for cold intolerance, polydipsia and polyuria.  Genitourinary:  Negative for dyspareunia, dysuria, flank pain, frequency and urgency.  Musculoskeletal:  Negative for arthralgias, back pain and myalgias.  Skin:  Negative for rash.  Allergic/Immunologic: Negative for environmental allergies.  Neurological:  Negative for dizziness, weakness and headaches.  Hematological:  Negative for adenopathy.  Psychiatric/Behavioral:  Positive for agitation, behavioral problems, decreased concentration, dysphoric mood and sleep disturbance. Negative for suicidal ideas. The patient is nervous/anxious.      Objective:    Physical Exam Vitals and nursing note reviewed.  Constitutional:      Appearance: Normal appearance. She is well-developed.  HENT:     Head: Normocephalic and atraumatic.     Nose: Nose normal.     Mouth/Throat:     Mouth: Mucous membranes are moist.  Eyes:      Extraocular Movements: Extraocular movements intact.     Conjunctiva/sclera: Conjunctivae normal.     Pupils: Pupils are equal, round, and reactive to light.  Cardiovascular:     Rate and Rhythm: Normal rate and regular rhythm.     Pulses: Normal pulses.     Heart sounds: Normal heart sounds.  Pulmonary:     Effort: Pulmonary effort is normal.     Breath sounds: Normal breath sounds.  Abdominal:     Palpations: Abdomen is soft.  Musculoskeletal:        General: Normal range of motion.     Cervical back: Normal range of motion and neck supple.  Lymphadenopathy:     Cervical: No cervical adenopathy.  Skin:    General: Skin is warm and dry.     Capillary Refill: Capillary refill takes less than 2 seconds.  Neurological:     General: No focal deficit present.     Mental Status: She is alert and oriented to person, place, and time.  Psychiatric:        Attention and Perception: Attention and perception normal.        Mood and Affect: Mood is anxious and depressed. Affect is tearful.        Speech: Speech normal.        Behavior: Behavior is agitated. Behavior is cooperative.        Thought Content: Thought content normal. Thought content is not paranoid or delusional. Thought content does not include homicidal or suicidal ideation. Thought content does not include homicidal or suicidal plan.        Cognition and Memory: Cognition and memory normal.        Judgment: Judgment normal.   Today's Vitals   01/14/22 1136  BP: 106/66  Pulse: 70  Temp: 97.9 F (36.6 C)  SpO2: 98%  Weight: 182 lb 3.2 oz (82.6 kg)  Height: $Remove'5\' 5"'CILzlWX$  (1.651 m)   Body mass index is 30.32 kg/m.   Wt Readings from Last 3 Encounters:  01/14/22 182 lb 3.2 oz (82.6 kg)  01/08/22 171 lb 11.2 oz (77.9 kg)  10/15/21 171 lb 11.2 oz (77.9 kg)  Health Maintenance Due  Topic Date Due   Pneumococcal Vaccine 86-33 Years old (1 - PCV) Never done   PAP SMEAR-Modifier  12/15/2020    There are no preventive care  reminders to display for this patient.  Lab Results  Component Value Date   TSH 1.240 08/28/2021   Lab Results  Component Value Date   WBC 10.1 08/28/2021   HGB 15.3 08/28/2021   HCT 45.7 08/28/2021   MCV 94 08/28/2021   PLT 375 08/28/2021   Lab Results  Component Value Date   NA 144 08/28/2021   K CANCELED 08/28/2021   CO2 24 08/28/2021   GLUCOSE CANCELED 08/28/2021   BUN 17 08/28/2021   CREATININE 0.85 08/28/2021   BILITOT 0.3 08/28/2021   ALKPHOS 121 08/28/2021   AST 14 08/28/2021   ALT 13 08/28/2021   PROT 6.9 08/28/2021   ALBUMIN 4.6 08/28/2021   CALCIUM 9.6 08/28/2021   ANIONGAP 11 08/19/2021   EGFR 84 08/28/2021   Lab Results  Component Value Date   CHOL 254 (H) 06/25/2018   Lab Results  Component Value Date   HDL 61 06/25/2018   Lab Results  Component Value Date   LDLCALC 146 (H) 06/25/2018   Lab Results  Component Value Date   TRIG 197 (H) 07/28/2018   Lab Results  Component Value Date   CHOLHDL 4.2 06/25/2018   Lab Results  Component Value Date   HGBA1C 5.6 08/28/2021      Assessment & Plan:  1. Bipolar 1 disorder ([place]) Patient has been taking any medication for mental health symptoms in time.  She is reporting rapid mood changes, irritability, and depression.  Start olanzapine 5 mg.  Recommend she take 1/2 tablet for first 3 to 4 days and then increase to 1 tablet daily.  Medication to be taken at night.  Strongly recommended patient see psychiatry.  Patient states that transportation and financial to keep her from seeing a specialist at this time. - OLANZapine (ZYPREXA) 5 MG tablet; Take 1 tablet (5 mg total) by mouth at bedtime.  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 - traZODone (DESYREL) 100 MG tablet; Take 1 tablet (100 mg total) by mouth at bedtime as needed and may repeat dose one time if needed for sleep.  Dispense: 30 tablet; Refill: 0  3. Generalized anxiety  disorder May take hydroxyzine 50 mg up to 3 times daily as needed for acute anxiety. - hydrOXYzine (ATARAX) 50 MG tablet; Take 1 tablet (50 mg total) by mouth 3 (three) times daily as needed for anxiety.  Dispense: 30 tablet; Refill: 1    Problem List Items Addressed This Visit       Other   Bipolar 1 disorder ([place]) - Primary   Relevant Medications   OLANZapine (ZYPREXA) 5 MG tablet   Generalized anxiety disorder   Relevant Medications   traZODone (DESYREL) 100 MG tablet   hydrOXYzine (ATARAX) 50 MG tablet   Insomnia due to anxiety and fear   Relevant Medications   traZODone (DESYREL) 100 MG tablet    Meds ordered this encounter  Medications   OLANZapine (ZYPREXA) 5 MG tablet    Sig: Take 1 tablet (5 mg total) by mouth at bedtime.    Dispense:  30 tablet    Refill:  1    Order Specific Question:   Supervising Provider    Answer:   Beatrice Lecher D [[id number]]  traZODone (DESYREL) 100 MG tablet    Sig: Take 1 tablet (100 mg total) by mouth at bedtime as needed and may repeat dose one time if needed for sleep.    Dispense:  30 tablet    Refill:  0    Order Specific Question:   Supervising Provider    Answer:   Beatrice Lecher D [[id number]]   DISCONTD: hydrOXYzine (ATARAX) 50 MG tablet    Sig: Take 1 tablet (50 mg total) by mouth 3 (three) times daily as needed for anxiety.    Dispense:  30 tablet    Refill:  1    Order Specific Question:   Supervising Provider    Answer:   Beatrice Lecher D [[id number]]   hydrOXYzine (ATARAX) 50 MG tablet    Sig: Take 1 tablet (50 mg total) by mouth 3 (three) times daily as needed for anxiety.    Dispense:  30 tablet    Refill:  1    Order Specific Question:   Supervising Provider    Answer:   Beatrice Lecher D [[id number]]    Follow-up: Return in about 3 weeks (around 02/04/2022) for mood.    Ronnell Freshwater, NP  This note was dictated using Systems analyst. Rapid proofreading was performed to expedite the delivery of  the information. Despite proofreading, phonetic errors will occur which are common with this voice recognition software. Please take this into consideration. If there are any concerns, please contact our office.

## 2022-01-16 ENCOUNTER — Telehealth: Payer: Self-pay | Admitting: Nurse Practitioner

## 2022-01-16 NOTE — Telephone Encounter (Signed)
With the new anxiety medication patient feels a little sluggish at work and now her manager is wanting some kind of documentation that she just started on a new medicine and it can possibly give her these side effects. Please advise. [phone]

## 2022-01-16 NOTE — Telephone Encounter (Signed)
Called pt she is advised of her pw that is ready for pickup

## 2022-01-21 NOTE — Telephone Encounter (Signed)
close

## 2022-02-04 ENCOUNTER — Ambulatory Visit: Payer: 59 | Admitting: Nurse Practitioner

## 2022-02-17 ENCOUNTER — Telehealth: Payer: Self-pay | Admitting: Nurse Practitioner

## 2022-02-17 ENCOUNTER — Other Ambulatory Visit: Payer: Self-pay | Admitting: Nurse Practitioner

## 2022-02-17 DIAGNOSIS — F319 Bipolar disorder, unspecified: Secondary | ICD-10-CM

## 2022-02-17 DIAGNOSIS — F409 Phobic anxiety disorder, unspecified: Secondary | ICD-10-CM

## 2022-02-17 MED ORDER — TRAZODONE HCL 100 MG PO TABS: 100.0000 mg | ORAL_TABLET | Freq: Every evening | ORAL | 0 refills | Status: DC | PRN

## 2022-02-17 MED ORDER — OLANZAPINE 5 MG PO TABS: 5.0000 mg | ORAL_TABLET | Freq: Every day | ORAL | 0 refills | Status: DC

## 2022-02-17 NOTE — Telephone Encounter (Signed)
Patient said she just don't think those medications are right for her. She's only going to pick up the Trazodone and will discuss at next visit.

## 2022-02-17 NOTE — Telephone Encounter (Signed)
Ok

## 2022-02-17 NOTE — Telephone Encounter (Signed)
She was supposed to have a follow up to talk about the medication. I feel like we can take away the hydroxyzine and continue the zyprexa and trazodone. I can fill the trazodone and zyprexa, but leave off the hydroxyzine. I refilled both of these for 30 days, but she needs to be seen for further refills.

## 2022-02-17 NOTE — Telephone Encounter (Signed)
Patient requesting refill of Trazodone. She also wanted mention the two newest prescriptions given to her did not work well together-hydroxyzine and Zyprexa, actually made her worse. Please advise. [phone]

## 2022-03-10 ENCOUNTER — Telehealth: Payer: Self-pay | Admitting: Nurse Practitioner

## 2022-03-10 ENCOUNTER — Other Ambulatory Visit: Payer: Self-pay | Admitting: Nurse Practitioner

## 2022-03-10 DIAGNOSIS — F409 Phobic anxiety disorder, unspecified: Secondary | ICD-10-CM

## 2022-03-10 MED ORDER — TRAZODONE HCL 100 MG PO TABS: 100.0000 mg | ORAL_TABLET | Freq: Every evening | ORAL | 1 refills | Status: DC | PRN

## 2022-03-10 NOTE — Telephone Encounter (Signed)
Patient requesting a refill of Trazodone and Gabapentin. Please advise. [phone] ?

## 2022-03-10 NOTE — Progress Notes (Signed)
Renewed trazodone and sent to [facility] pharmacy.  ?

## 2022-03-10 NOTE — Telephone Encounter (Signed)
I refilled her trazodone and sent to [facility]. I did not write for the gabapentin and I have not refilled it for her in the past. Who was prescribing this for her? She will need to contact that provider for the gabapentin.

## 2022-03-11 NOTE — Telephone Encounter (Signed)
lmtc

## 2022-04-23 ENCOUNTER — Telehealth: Payer: Self-pay | Admitting: Nurse Practitioner

## 2022-04-23 DIAGNOSIS — F409 Phobic anxiety disorder, unspecified: Secondary | ICD-10-CM

## 2022-04-23 MED ORDER — TRAZODONE HCL 100 MG PO TABS: 100.0000 mg | ORAL_TABLET | Freq: Every evening | ORAL | 0 refills | Status: DC | PRN

## 2022-04-23 NOTE — Telephone Encounter (Signed)
She really just needs to have a follow up visit regarding her symptoms or other medication refills

## 2022-04-23 NOTE — Telephone Encounter (Signed)
Patient last seen in January and advised to follow up in 3 weeks. Patient is past due for follow up.  ? ?Sent 30 day supply to [facility] pharmacy of Trazodone.  ? ?In regard to Omeprazole-cost: Patient will need to reach out to pharmacy to find price of Omeprazole OTC and then contact insurance company to see what price would be for drug using insurance.  ? ?For further refills or to further discuss Omeprazole prescription, etc patient will need to schedule a follow up visit. AS, CMA ?

## 2022-04-23 NOTE — Telephone Encounter (Signed)
Omeprazole is not on patients medication list. It doesn't appear that Herbert Seta has prescribed that medication for patient before. She will have to discuss this with Herbert Seta during her appointment before we are able to send the medication into the pharmacy. AS, CMA ?

## 2022-04-23 NOTE — Telephone Encounter (Signed)
Patient called into office to get refill on her Trazadone and to inquire about getting a prescription for Omeprazole 20mg (it's cheaper if she has a prescription vs. getting over the counter. used to prescribe to her.  Confirmed [facility] is the correct pharmacy.  ? ?traZODone (DESYREL) 100 MG tablet Stacie Sanders  ?  Order Details ?Dose: 100 mg Route: Oral Frequency: At bedtime PRN and repeat x1 PRN for sleep  ?Dispense Quantity: 30 tablet Refills: 1   ?Indications of Use: Insomnia  ?     ?Sig: Take 1 tablet (100 mg total) by mouth at bedtime as needed and may repeat dose one time if needed for sleep.  ?     ?Start Date: 03/10/22 End Date: --  ?Written Date: 03/10/22 Expiration Date: 03/10/23  ?   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   ? ?

## 2022-04-24 NOTE — Telephone Encounter (Signed)
Patient has been made aware of the below and verbalized understanding. AS, CMA 

## 2022-05-06 ENCOUNTER — Encounter: Payer: Self-pay | Admitting: Nurse Practitioner

## 2022-05-06 ENCOUNTER — Ambulatory Visit (INDEPENDENT_AMBULATORY_CARE_PROVIDER_SITE_OTHER): Payer: PRIVATE HEALTH INSURANCE | Admitting: Nurse Practitioner

## 2022-05-06 VITALS — BP 129/87 | HR 84 | Temp 98.6°F | Ht 64.96 in | Wt 166.8 lb

## 2022-05-06 DIAGNOSIS — M792 Neuralgia and neuritis, unspecified: Secondary | ICD-10-CM

## 2022-05-06 DIAGNOSIS — F333 Major depressive disorder, recurrent, severe with psychotic symptoms: Secondary | ICD-10-CM | POA: Diagnosis not present

## 2022-05-06 DIAGNOSIS — R634 Abnormal weight loss: Secondary | ICD-10-CM | POA: Insufficient documentation

## 2022-05-06 MED ORDER — GABAPENTIN 300 MG PO CAPS: 900.0000 mg | ORAL_CAPSULE | Freq: Three times a day (TID) | ORAL | 1 refills | Status: DC

## 2022-05-06 MED ORDER — ARIPIPRAZOLE 5 MG PO TABS
2.5000 mg | ORAL_TABLET | Freq: Every day | ORAL | 1 refills | Status: DC
Start: 2022-05-06 — End: 2022-06-03

## 2022-05-06 NOTE — Progress Notes (Signed)
Established patient visit ? ? ?Patient: Stacie Sanders   DOB: 07-14-1971   51 y.o. Female  MRN: [id number] ?Visit Date: 05/06/2022 ? ? ?Chief Complaint  ?Patient presents with  ? Anxiety  ? ?Subjective  ?  ?Anxiety ?Symptoms include decreased concentration, nausea and nervous/anxious behavior. Patient reports no chest pain, dizziness, palpitations or shortness of breath.  ? ?  ?The patient states that the last two anti-anxiety medications, mood stabilizers, Stacie Sanders was prescribed at her most recent visit, made her anxiety and irritable. Stacie Sanders was terminated from her job a few days after that. Stacie Sanders states that Stacie Sanders stopped taking them.  ?-states that Stacie Sanders cries all the time.  ?-has no energy. Will stay in bed for entire days when Stacie Sanders is off work ?-states that Stacie Sanders does not like herself at all when Stacie Sanders is like this.  ?-feels like Stacie Sanders is just existing. Goes to work, goes home, and goes to bed.  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 Is getting her gabapentin from a friend as Stacie Sanders lost her insurance. States that Stacie Sanders is struggling to stay afloat.  ?-Stacie Sanders denies feeling suicidal. However, Stacie Sanders states that Stacie Sanders doesn't want to be here.  ?-states that Stacie Sanders doesn't feel like herself.  ?-has decreased appetite. If Stacie Sanders tries to eat, Stacie Sanders is throwing it right back up. Has lost 14 pounds since Stacie Sanders was last here.  ? ? ?Medications: ?Outpatient Medications Prior to Visit  ?Medication Sig  ? diphenoxylate-atropine (LOMOTIL) 2.5-0.025 MG tablet Take 1 tablet by mouth 3 (three) times daily as needed for diarrhea or loose stools.  ? EPINEPHrine (EPIPEN 2-PAK) 0.3 mg/0.3 mL IJ SOAJ injection Inject 0.3 mg into the muscle as needed for anaphylaxis.  ? Gauze Pads & Dressings (BAND-AID KLING ROLLED GAUZE LG) MISC Apply rolled gauze over clean bandage twice daily after cleaning wound.  ? hydrOXYzine (ATARAX) 50 MG tablet Take 1 tablet (50 mg total) by mouth 3 (three) times daily as needed for anxiety.  ? ibuprofen (ADVIL,MOTRIN) 600 MG tablet Take 1 tablet  (600 mg total) by mouth every 8 (eight) hours as needed.  ? Multiple Vitamin (MULTIVITAMIN WITH MINERALS) TABS tablet Take 1 tablet by mouth daily.  ? ondansetron (ZOFRAN-ODT) 4 MG disintegrating tablet Take 1 tablet (4 mg total) by mouth every 8 (eight) hours as needed for nausea or vomiting.  ? silver sulfADIAZINE (SILVADENE) 1 % cream Apply 1 application topically daily.  ? traZODone (DESYREL) 100 MG tablet Take 1 tablet (100 mg total) by mouth at bedtime as needed and may repeat dose one time if needed for sleep.  ? Wound Dressings (VASELINE PETROLATUM GAUZE) PADS Apply over clean wound twice daily  ? [DISCONTINUED] gabapentin (NEURONTIN) 300 MG capsule Take 900 mg by mouth 3 (three) times daily.  ? [DISCONTINUED] OLANZapine (ZYPREXA) 5 MG tablet Take 1 tablet (5 mg total) by mouth at bedtime.  ? ?No facility-administered medications prior to visit.  ? ? ?Review of Systems  ?Constitutional:  Positive for appetite change, fatigue and unexpected weight change. Negative for chills and fever.  ?     Weight loss of 14 pounds since last visit. Not trying to lose weight but does not feel hungry.   ?HENT:  Negative for congestion, postnasal drip, rhinorrhea, sinus pressure, sinus pain, sneezing and sore throat.   ?Eyes: Negative.   ?Respiratory:  Negative for cough, chest tightness, shortness of breath and wheezing.   ?Cardiovascular:  Negative for chest pain and palpitations.  ?Gastrointestinal:  Positive for nausea. Negative for  abdominal pain, constipation, diarrhea and vomiting.  ?Endocrine: Negative for cold intolerance, heat intolerance, polydipsia and polyuria.  ?Genitourinary:  Negative for dyspareunia, dysuria, flank pain, frequency and urgency.  ?Musculoskeletal:  Negative for arthralgias, back pain and myalgias.  ?Skin:  Negative for rash.  ?Allergic/Immunologic: Negative for environmental allergies.  ?Neurological:  Negative for dizziness, weakness and headaches.  ?     Neuropathy in both hands.    ?Hematological:  Negative for adenopathy.  ?Psychiatric/Behavioral:  Positive for decreased concentration, dysphoric mood and sleep disturbance. The patient is nervous/anxious.   ? ? ? ? Objective  ?  ? ?Today's Vitals  ? 05/06/22 0810  ?BP: 129/87  ?Pulse: 84  ?Temp: 98.6 ?F (37 ?C)  ?SpO2: 98%  ?Weight: 166 lb 12.8 oz (75.7 kg)  ?Height: 5' 4.96" (1.65 m)  ? ?Body mass index is 27.79 kg/m?.  ? ?BP Readings from Last 3 Encounters:  ?05/06/22 129/87  ?01/14/22 106/66  ?11/23/21 116/76  ?  ?Wt Readings from Last 3 Encounters:  ?05/06/22 166 lb 12.8 oz (75.7 kg)  ?01/14/22 182 lb 3.2 oz (82.6 kg)  ?01/08/22 171 lb 11.2 oz (77.9 kg)  ?  ?Physical Exam ?Vitals and nursing note reviewed.  ?Constitutional:   ?   Appearance: Normal appearance. Stacie Sanders is well-developed.  ?HENT:  ?   Head: Normocephalic.  ?Eyes:  ?   Pupils: Pupils are equal, round, and reactive to light.  ?Cardiovascular:  ?   Rate and Rhythm: Normal rate and regular rhythm.  ?   Pulses: Normal pulses.  ?   Heart sounds: Normal heart sounds.  ?Pulmonary:  ?   Effort: Pulmonary effort is normal.  ?   Breath sounds: Normal breath sounds.  ?Abdominal:  ?   Palpations: Abdomen is soft.  ?Musculoskeletal:     ?   General: Normal range of motion.  ?   Cervical back: Normal range of motion and neck supple.  ?Lymphadenopathy:  ?   Cervical: No cervical adenopathy.  ?Skin: ?   General: Skin is warm and dry.  ?   Capillary Refill: Capillary refill takes less than 2 seconds.  ?Neurological:  ?   General: No focal deficit present.  ?   Mental Status: Stacie Sanders is alert and oriented to person, place, and time.  ?Psychiatric:     ?   Attention and Perception: Attention and perception normal.     ?   Mood and Affect: Mood is anxious. Affect is labile and tearful.     ?   Speech: Speech normal.     ?   Behavior: Behavior normal. Behavior is cooperative.     ?   Thought Content: Thought content normal.     ?   Cognition and Memory: Cognition and memory normal.     ?   Judgment:  Judgment normal.  ?  ? ? Assessment & Plan  ?  ?1. Abnormal weight loss ?Patient has lost 16 pounds since last visit. Unable to eat due to severe anxiety/depression. Start new medication and reassess at next visit  ? ?2. Neuralgia and neuritis, unspecified ?Renew current prescription for gabapentin and take three times daily as needed.  ?- gabapentin (NEURONTIN) 300 MG capsule; Take 3 capsules (900 mg total) by mouth 3 (three) times daily.  Dispense: 90 capsule; Refill: 1 ? ?3. Severe episode of recurrent major depressive disorder, with psychotic features (HCC) ?Start abilify 5 mg. Start 2.5mg  daily for 7 days then increase to 1 tablet daily if tolerated. Recommend  Stacie Sanders partake in counseling services offered by her employer. Will refer to psychiatry when patient gets new medical insurance.  ?- ARIPiprazole (ABILIFY) 5 MG tablet; Take 0.5-1 tablets (2.5-5 mg total) by mouth daily. Take 1.2 tablet po QD for 7 days then increase to 1 tablet po QD.  Dispense: 30 tablet; Refill: 1  ? ?Problem List Items Addressed This Visit   ? ?  ? Nervous and Auditory  ? Neuralgia and neuritis, unspecified  ? Relevant Medications  ? gabapentin (NEURONTIN) 300 MG capsule  ?  ? Other  ? Depression (Chronic)  ? Relevant Medications  ? ARIPiprazole (ABILIFY) 5 MG tablet  ? Abnormal weight loss - Primary  ?  ? ?Return in about 4 weeks (around 06/03/2022) for mood.  ?   ? ? ? ? ?Carlean Jews, NP  ?[facility] ?[phone] (phone) ?[phone] (fax) ? ?[facility]  ?

## 2022-06-02 ENCOUNTER — Telehealth: Payer: Self-pay | Admitting: Nurse Practitioner

## 2022-06-02 NOTE — Telephone Encounter (Signed)
I will fill them during her visit

## 2022-06-02 NOTE — Telephone Encounter (Signed)
Patient called and stated she needed a refill on the two medications listed below.   ARIPiprazole (ABILIFY) 5 MG tablet   traZODone (DESYREL) 100 MG tablet    [facility] [address] [place], [place] - [address] Phone:  [phone]  Fax:  [phone]

## 2022-06-02 NOTE — Telephone Encounter (Signed)
ok 

## 2022-06-02 NOTE — Telephone Encounter (Signed)
Pt has an appt here tomorrow

## 2022-06-03 ENCOUNTER — Telehealth (INDEPENDENT_AMBULATORY_CARE_PROVIDER_SITE_OTHER): Payer: PRIVATE HEALTH INSURANCE | Admitting: Nurse Practitioner

## 2022-06-03 ENCOUNTER — Encounter: Payer: Self-pay | Admitting: Nurse Practitioner

## 2022-06-03 VITALS — Ht 64.96 in | Wt 156.0 lb

## 2022-06-03 DIAGNOSIS — R42 Dizziness and giddiness: Secondary | ICD-10-CM | POA: Diagnosis not present

## 2022-06-03 DIAGNOSIS — F409 Phobic anxiety disorder, unspecified: Secondary | ICD-10-CM

## 2022-06-03 DIAGNOSIS — F333 Major depressive disorder, recurrent, severe with psychotic symptoms: Secondary | ICD-10-CM | POA: Diagnosis not present

## 2022-06-03 DIAGNOSIS — F5105 Insomnia due to other mental disorder: Secondary | ICD-10-CM

## 2022-06-03 MED ORDER — TRAZODONE HCL 100 MG PO TABS: 100.0000 mg | ORAL_TABLET | Freq: Every evening | ORAL | 3 refills | Status: DC | PRN

## 2022-06-03 MED ORDER — MECLIZINE HCL 25 MG PO TABS: 25.0000 mg | ORAL_TABLET | Freq: Two times a day (BID) | ORAL | 1 refills | Status: DC | PRN

## 2022-06-03 MED ORDER — ARIPIPRAZOLE 5 MG PO TABS: 7.5000 mg | ORAL_TABLET | Freq: Every day | ORAL | 3 refills | Status: DC

## 2022-06-03 NOTE — Progress Notes (Signed)
Virtual Visit via Telephone Note  I connected with Stacie Sanders on 06/09/22 at  8:50 AM EDT by telephone and verified that I am speaking with the correct person using two identifiers.  Location: Patient: home Provider: [place] [facility]     I discussed the limitations, risks, security and privacy concerns of performing an evaluation and management service by telephone and the availability of in person appointments. I also discussed with the patient that there may be a patient responsible charge related to this service. The patient expressed understanding and agreed to proceed.   History of Present Illness: The patient presents for follow up.  Patient was started on aripiprazole 5 mg tablets at her last visit.  Was instructed to take 1/2 tablet daily.  Could titrate to 1 tablet daily as needed and as tolerated.  She was able to increase dose to 5 mg daily.  She reports doing very well with this medication.  She feels that anxiety and depression have stabilized.  She has improved energy and motivation.  She reports improved concentration and ability to require tasks at work and at home. She reports no negative side effects from taking the new medication.  She continues to take trazodone at bedtime to help with sleep.  She does need a new prescription today. She does have some intermittent episodes of vertigo.  States this started 2 days ago after recovery from sinus infection.   Observations/Objective:  The patient is alert and oriented. She is pleasant and answers all questions appropriately. Breathing is non-labored. She is in no acute distress at this time.    Today's Vitals   06/03/22 0812  Weight: 156 lb (70.8 kg)  Height: 5' 4.96" (1.65 m)   Body mass index is 25.99 kg/m.   Assessment and Plan: 1. Severe episode of recurrent major depressive disorder, with psychotic features (HCC) Improved since most recent visit.  We will have her increase Abilify to 7.5  mg daily.  Consider increasing Abilify to 10 mg as tolerated.  A new prescription was sent to her pharmacy today.  We will reassess in 3 months and as needed. - ARIPiprazole (ABILIFY) 5 MG tablet; Take 1.5 tablets (7.5 mg total) by mouth daily. Take 1.2 tablet po QD for 7 days then increase to 1 tablet po QD.  Dispense: 45 tablet; Refill: 3  2. Insomnia due to anxiety and fear She may continue to take trazodone at bedtime as needed for insomnia.  New prescription sent to her pharmacy today. - traZODone (DESYREL) 100 MG tablet; Take 1 tablet (100 mg total) by mouth at bedtime as needed and may repeat dose one time if needed for sleep.  Dispense: 30 tablet; Refill: 3  3. Vertigo May take meclizine 25 mg twice daily as needed for acute vertigo. - meclizine (ANTIVERT) 25 MG tablet; Take 1 tablet (25 mg total) by mouth 2 (two) times daily as needed for dizziness.  Dispense: 45 tablet; Refill: 1   Follow Up Instructions:    I discussed the assessment and treatment plan with the patient. The patient was provided an opportunity to ask questions and all were answered. The patient agreed with the plan and demonstrated an understanding of the instructions.   The patient was advised to call back or seek an in-person evaluation if the symptoms worsen or if the condition fails to improve as anticipated.  I provided 15 minutes of non-face-to-face time during this encounter.   Carlean Jews, NP

## 2022-06-09 DIAGNOSIS — R42 Dizziness and giddiness: Secondary | ICD-10-CM | POA: Insufficient documentation

## 2022-07-17 ENCOUNTER — Telehealth: Payer: Self-pay | Admitting: Nurse Practitioner

## 2022-07-17 ENCOUNTER — Other Ambulatory Visit: Payer: Self-pay

## 2022-07-17 DIAGNOSIS — F409 Phobic anxiety disorder, unspecified: Secondary | ICD-10-CM

## 2022-07-17 DIAGNOSIS — F333 Major depressive disorder, recurrent, severe with psychotic symptoms: Secondary | ICD-10-CM

## 2022-07-17 MED ORDER — TRAZODONE HCL 100 MG PO TABS: 100.0000 mg | ORAL_TABLET | Freq: Every evening | ORAL | 3 refills | Status: DC | PRN

## 2022-07-17 MED ORDER — ARIPIPRAZOLE 5 MG PO TABS: 7.5000 mg | ORAL_TABLET | Freq: Every day | ORAL | 3 refills | Status: DC

## 2022-07-17 NOTE — Telephone Encounter (Signed)
[facility]

## 2022-07-17 NOTE — Telephone Encounter (Signed)
Patient requesting refill of Trazodone and Abilify. Please advise.

## 2022-07-18 ENCOUNTER — Other Ambulatory Visit: Payer: Self-pay | Admitting: Nurse Practitioner

## 2022-07-18 DIAGNOSIS — F333 Major depressive disorder, recurrent, severe with psychotic symptoms: Secondary | ICD-10-CM

## 2022-07-18 MED ORDER — ARIPIPRAZOLE 5 MG PO TABS: 7.5000 mg | ORAL_TABLET | Freq: Every day | ORAL | 3 refills | Status: DC

## 2023-05-05 ENCOUNTER — Other Ambulatory Visit: Payer: Self-pay

## 2023-05-05 ENCOUNTER — Emergency Department (HOSPITAL_COMMUNITY): Payer: Medicaid Other

## 2023-05-05 ENCOUNTER — Encounter (HOSPITAL_COMMUNITY): Payer: Self-pay

## 2023-05-05 ENCOUNTER — Emergency Department (HOSPITAL_COMMUNITY)
Admission: EM | Admit: 2023-05-05 | Discharge: 2023-05-06 | Disposition: A | Discharge status: Home / Self Care | Payer: Medicaid Other | Attending: Emergency Medicine | Admitting: Emergency Medicine

## 2023-05-05 DIAGNOSIS — R109 Unspecified abdominal pain: Secondary | ICD-10-CM | POA: Diagnosis not present

## 2023-05-05 DIAGNOSIS — R1011 Right upper quadrant pain: Secondary | ICD-10-CM | POA: Diagnosis not present

## 2023-05-05 DIAGNOSIS — R110 Nausea: Secondary | ICD-10-CM | POA: Insufficient documentation

## 2023-05-05 DIAGNOSIS — K5730 Diverticulosis of large intestine without perforation or abscess without bleeding: Secondary | ICD-10-CM | POA: Diagnosis not present

## 2023-05-05 LAB — COMPREHENSIVE METABOLIC PANEL
ALT: 20 U/L (ref 0–44)
AST: 20 U/L (ref 15–41)
Albumin: 4.2 g/dL (ref 3.5–5.0)
Alkaline Phosphatase: 96 U/L (ref 38–126)
Anion gap: 10 (ref 5–15)
BUN: 14 mg/dL (ref 6–20)
CO2: 23 mmol/L (ref 22–32)
Calcium: 9.4 mg/dL (ref 8.9–10.3)
Chloride: 106 mmol/L (ref 98–111)
Creatinine, Ser: 0.77 mg/dL (ref 0.44–1.00)
GFR, Estimated: >60 mL/min (ref >60)
Glucose, Bld: 106 mg/dL — ABNORMAL HIGH (ref 70–99)
Potassium: 3.6 mmol/L (ref 3.5–5.1)
Sodium: 139 mmol/L (ref 135–145)
Total Bilirubin: 0.6 mg/dL (ref 0.3–1.2)
Total Protein: 7.3 g/dL (ref 6.5–8.1)

## 2023-05-05 LAB — CBC WITH DIFFERENTIAL/PLATELET
Abs Immature Granulocytes: 0.03 10*3/uL (ref 0.00–0.07)
Basophils Absolute: 0 10*3/uL (ref 0.0–0.1)
Basophils Relative: 0 %
Eosinophils Absolute: 0.1 10*3/uL (ref 0.0–0.5)
Eosinophils Relative: 1 %
HCT: 39.9 % (ref 36.0–46.0)
Hemoglobin: 13.4 g/dL (ref 12.0–15.0)
Immature Granulocytes: 0 %
Lymphocytes Relative: 26 %
Lymphs Abs: 2.6 10*3/uL (ref 0.7–4.0)
MCH: 31 pg (ref 26.0–34.0)
MCHC: 33.6 g/dL (ref 30.0–36.0)
MCV: 92.4 fL (ref 80.0–100.0)
Monocytes Absolute: 0.7 10*3/uL (ref 0.1–1.0)
Monocytes Relative: 7 %
Neutro Abs: 6.6 10*3/uL (ref 1.7–7.7)
Neutrophils Relative %: 66 %
Platelets: 338 10*3/uL (ref 150–400)
RBC: 4.32 MIL/uL (ref 3.87–5.11)
RDW: 13.1 % (ref 11.5–15.5)
WBC: 10 10*3/uL (ref 4.0–10.5)
nRBC: 0 % (ref 0.0–0.2)

## 2023-05-05 LAB — LIPASE, BLOOD: Lipase: 28 U/L (ref 11–51)

## 2023-05-05 LAB — I-STAT BETA HCG BLOOD, ED (MC, WL, AP ONLY): I-stat hCG, quantitative: 5.4 m[IU]/mL — ABNORMAL HIGH (ref <5)

## 2023-05-05 MED ORDER — DIPHENHYDRAMINE HCL 50 MG/ML IJ SOLN
25.0000 mg | Freq: Once | INTRAMUSCULAR | Status: AC
  Administered 2023-05-05: 25 mg via INTRAVENOUS
  Filled 2023-05-05: qty 1

## 2023-05-05 MED ORDER — ONDANSETRON HCL 4 MG/2ML IJ SOLN
4.0000 mg | Freq: Once | INTRAMUSCULAR | Status: AC
  Administered 2023-05-05: 4 mg via INTRAVENOUS
  Filled 2023-05-05: qty 2

## 2023-05-05 MED ORDER — HYDROMORPHONE HCL 1 MG/ML IJ SOLN
1.0000 mg | Freq: Once | INTRAMUSCULAR | Status: AC
  Administered 2023-05-05: 1 mg via INTRAVENOUS
  Filled 2023-05-05: qty 1

## 2023-05-05 NOTE — ED Provider Notes (Signed)
[place] EMERGENCY DEPARTMENT AT [facility] Provider Note   CSN: [id number] Arrival date & time: 05/05/23  2115     History {Add pertinent medical, surgical, social history, OB history to HPI:1} Chief Complaint  Patient presents with  . Flank Pain  . Abdominal Pain    Stacie Sanders is a 52 y.o. female.  The history is provided by the patient and medical records.  Flank Pain Associated symptoms include abdominal pain.  Abdominal Pain Associated symptoms: nausea    52 y.o. F with hx of kidney stones, depression, bipolar disorder, presenting to the ED for right sided flank pain.  States symptoms began today around 4:30PM, quite sudden onset.  States pain is sharp/stabbing but also feels like a "ball" sitting in her side.  She reports some nausea but denies vomiting.  No fever/chills.  Hx kidney stones but states felt different last time she remembers.  Prior abdominal surgeries include tubal ligation.  No meds taken PTA.  Home Medications Prior to Admission medications   Medication Sig Start Date End Date Taking? Authorizing Provider  ARIPiprazole (ABILIFY) 5 MG tablet Take 1.5 tablets (7.5 mg total) by mouth daily. Take 1.5 tablets po QD 07/18/22   Carlean Jews, NP  diphenoxylate-atropine (LOMOTIL) 2.5-0.025 MG tablet Take 1 tablet by mouth 3 (three) times daily as needed for diarrhea or loose stools. 01/08/22   Carlean Jews, NP  EPINEPHrine (EPIPEN 2-PAK) 0.3 mg/0.3 mL IJ SOAJ injection Inject 0.3 mg into the muscle as needed for anaphylaxis. 08/28/21   Carlean Jews, NP  gabapentin (NEURONTIN) 300 MG capsule Take 3 capsules (900 mg total) by mouth 3 (three) times daily. 05/06/22   Carlean Jews, NP  Gauze Pads & Dressings (BAND-AID KLING ROLLED GAUZE LG) MISC Apply rolled gauze over clean bandage twice daily after cleaning wound. 10/15/21   Carlean Jews, NP  hydrOXYzine (ATARAX) 50 MG tablet Take 1 tablet (50 mg total) by mouth 3 (three) times daily  as needed for anxiety. 01/14/22   Carlean Jews, NP  ibuprofen (ADVIL,MOTRIN) 600 MG tablet Take 1 tablet (600 mg total) by mouth every 8 (eight) hours as needed. 08/04/18   Danford, Orpha Bur D, NP  meclizine (ANTIVERT) 25 MG tablet Take 1 tablet (25 mg total) by mouth 2 (two) times daily as needed for dizziness. 06/03/22   Carlean Jews, NP  Multiple Vitamin (MULTIVITAMIN WITH MINERALS) TABS tablet Take 1 tablet by mouth daily.    [provider]  ondansetron (ZOFRAN-ODT) 4 MG disintegrating tablet Take 1 tablet (4 mg total) by mouth every 8 (eight) hours as needed for nausea or vomiting. 01/08/22   Carlean Jews, NP  silver sulfADIAZINE (SILVADENE) 1 % cream Apply 1 application topically daily. 10/03/21   Wallis Bamberg, PA-C  traZODone (DESYREL) 100 MG tablet Take 1 tablet (100 mg total) by mouth at bedtime as needed and may repeat dose one time if needed for sleep. 07/17/22   Carlean Jews, NP  Wound Dressings (VASELINE PETROLATUM GAUZE) PADS Apply over clean wound twice daily 10/15/21   Carlean Jews, NP      Allergies    Other, Ed-apap [acetaminophen], Fentanyl, Percocet [oxycodone-acetaminophen], and Robaxin [methocarbamol]    Review of Systems   Review of Systems  Gastrointestinal:  Positive for abdominal pain and nausea.  Genitourinary:  Positive for flank pain.  All other systems reviewed and are negative.   Physical Exam Updated Vital Signs BP (!) 124/103 (BP Location:  Right Arm)   Pulse 86   Temp 98.1 F (36.7 C)   Resp 20   Ht 5\' 5"  (1.651 m)   Wt 68 kg   LMP  (LMP Unknown)   SpO2 97%   BMI 24.96 kg/m  Physical Exam  ED Results / Procedures / Treatments   Labs (all labs ordered are listed, but only abnormal results are displayed) Labs Reviewed  COMPREHENSIVE METABOLIC PANEL - Abnormal; Notable for the following components:      Result Value   Glucose, Bld 106 (*)    All other components within normal limits  I-STAT BETA HCG BLOOD, ED (MC, WL, AP  ONLY) - Abnormal; Notable for the following components:   I-stat hCG, quantitative 5.4 (*)    All other components within normal limits  LIPASE, BLOOD  CBC WITH DIFFERENTIAL/PLATELET  URINALYSIS, ROUTINE W REFLEX MICROSCOPIC    EKG None  Radiology No results found.  Procedures Procedures  {Document cardiac monitor, telemetry assessment procedure when appropriate:1}  Medications Ordered in ED Medications  ondansetron (ZOFRAN) injection 4 mg (has no administration in time range)  HYDROmorphone (DILAUDID) injection 1 mg (has no administration in time range)    ED Course/ Medical Decision Making/ A&P   {   Click here for ABCD2, HEART and other calculatorsREFRESH Note before signing :1}                          Medical Decision Making Amount and/or Complexity of Data Reviewed Labs: ordered. Radiology: ordered.  Risk Prescription drug management.   ***  {Document critical care time when appropriate:1} {Document review of labs and clinical decision tools ie heart score, Chads2Vasc2 etc:1}  {Document your independent review of radiology images, and any outside records:1} {Document your discussion with family members, caretakers, and with consultants:1} {Document social determinants of health affecting pt's care:1} {Document your decision making why or why not admission, treatments were needed:1} Final Clinical Impression(s) / ED Diagnoses Final diagnoses:  None    Rx / DC Orders ED Discharge Orders     None

## 2023-05-05 NOTE — ED Notes (Signed)
Encouraged patient to attempt to provide urine sample. Patient reports unable to urinate at this time.

## 2023-05-05 NOTE — ED Triage Notes (Signed)
Today at 1630 pt was at work when she began having right flank pain and RUQ pain. Pain is sharp and stabbing and comes and goes. Pt feels like gold ball is rolling through stomach. Pt is voiding more frequently. Denies dysuria. C/O diarrhea x5 today. NO nv

## 2023-05-06 DIAGNOSIS — K5730 Diverticulosis of large intestine without perforation or abscess without bleeding: Secondary | ICD-10-CM | POA: Diagnosis not present

## 2023-05-06 LAB — URINALYSIS, ROUTINE W REFLEX MICROSCOPIC
Bilirubin Urine: NEGATIVE
Glucose, UA: NEGATIVE mg/dL
Hgb urine dipstick: NEGATIVE
Ketones, ur: NEGATIVE mg/dL
Leukocytes,Ua: NEGATIVE
Nitrite: NEGATIVE
Protein, ur: 30 mg/dL — AB
Specific Gravity, Urine: 1.029 (ref 1.005–1.030)
pH: 5 (ref 5.0–8.0)

## 2023-05-06 MED ORDER — HYDROXYZINE HCL 25 MG PO TABS
25.0000 mg | ORAL_TABLET | Freq: Once | ORAL | Status: AC
  Administered 2023-05-06: 25 mg via ORAL
  Filled 2023-05-06: qty 1

## 2023-05-06 NOTE — ED Notes (Signed)
Pt ambulated stand-by assist to bathroom to try and provide urine sample,,

## 2023-05-06 NOTE — Discharge Instructions (Signed)
Labs today were normal, urine did not show blood/infection and CT did not reveal any explanation for symptoms. Please follow-up with your primary care doctor. Return here for any new/acute changes.

## 2023-07-06 ENCOUNTER — Telehealth: Payer: Self-pay | Admitting: *Deleted

## 2023-07-06 NOTE — Telephone Encounter (Signed)
Contacted pt and she will call back to get an appointment scheduled tomorrow.

## 2023-07-06 NOTE — Telephone Encounter (Signed)
LVM to call office to see about getting an appointment scheduled.  A request from pharmacy for Trazadone came in and she needs an appointment for refills per the Rx.     LOV: video6/6/23

## 2023-07-07 ENCOUNTER — Telehealth: Payer: Self-pay

## 2023-07-07 DIAGNOSIS — F409 Phobic anxiety disorder, unspecified: Secondary | ICD-10-CM

## 2023-07-07 MED ORDER — TRAZODONE HCL 100 MG PO TABS
100.0000 mg | ORAL_TABLET | Freq: Every evening | ORAL | 0 refills | Status: AC | PRN
Start: 2023-07-07 — End: ?

## 2023-07-07 NOTE — Telephone Encounter (Signed)
Refill sent.

## 2023-07-07 NOTE — Addendum Note (Signed)
Addended by: Tonny Bollman on: 07/07/2023 02:51 PM   Modules accepted: Orders

## 2023-07-07 NOTE — Telephone Encounter (Signed)
Pt called and scheduled an appointment on 08/04/23 for med refills, she would like a refill to be sent in enough to last until appointment.

## 2023-07-07 NOTE — Telephone Encounter (Signed)
Office visit required for refills.   

## 2023-08-04 ENCOUNTER — Ambulatory Visit: Payer: Medicaid Other | Admitting: Family Medicine

## 2023-09-28 ENCOUNTER — Telehealth: Payer: Self-pay

## 2023-09-28 DIAGNOSIS — F409 Phobic anxiety disorder, unspecified: Secondary | ICD-10-CM

## 2023-09-28 MED ORDER — TRAZODONE HCL 100 MG PO TABS: 100.0000 mg | ORAL_TABLET | Freq: Every evening | ORAL | 1 refills | Status: DC | PRN

## 2023-09-28 NOTE — Telephone Encounter (Signed)
Prescription Request  09/28/2023  LOV:06/03/22  What is the name of the medication or equipment? traZODone (DESYREL) 100 MG tablet   Have you contacted your pharmacy to request a refill? Yes   Which pharmacy would you like this sent to?  [facility] - [place], [place] - [address] [place] [address] Phone: [phone] Fax: [phone]    Patient notified that their request is being sent to the clinical staff for review and that they should receive a response within 2 business days.   Please advise at Mobile [phone] (mobile)   I advised pt I would see if provider was able to send in enough to get to her appt. Pt couldn't keep last appt due to work schedule.

## 2023-09-28 NOTE — Telephone Encounter (Signed)
Meds ordered this encounter  Medications   traZODone (DESYREL) 100 MG tablet    Sig: Take 1 tablet (100 mg total) by mouth at bedtime as needed and may repeat dose one time if needed for sleep.    Dispense:  30 tablet    Refill:  1    NEEDS APT FOR FURTHER REFILLS    Order Specific Question:   Supervising Provider    Answer:   Agapito Games [[id number]]   Enough refills to get her through upcoming appointment on 11/04/2023 sent in

## 2023-11-04 ENCOUNTER — Ambulatory Visit: Payer: Medicaid Other | Admitting: Family Medicine

## 2023-11-04 ENCOUNTER — Encounter: Payer: Self-pay | Admitting: Family Medicine

## 2023-11-04 VITALS — BP 93/59 | HR 72 | Temp 98.0°F | Ht 65.0 in | Wt 162.2 lb

## 2023-11-04 DIAGNOSIS — Z23 Encounter for immunization: Secondary | ICD-10-CM

## 2023-11-04 DIAGNOSIS — M792 Neuralgia and neuritis, unspecified: Secondary | ICD-10-CM

## 2023-11-04 DIAGNOSIS — F409 Phobic anxiety disorder, unspecified: Secondary | ICD-10-CM

## 2023-11-04 DIAGNOSIS — F32A Depression, unspecified: Secondary | ICD-10-CM

## 2023-11-04 DIAGNOSIS — F5105 Insomnia due to other mental disorder: Secondary | ICD-10-CM

## 2023-11-04 DIAGNOSIS — F411 Generalized anxiety disorder: Secondary | ICD-10-CM

## 2023-11-04 MED ORDER — TRAZODONE HCL 100 MG PO TABS
200.0000 mg | ORAL_TABLET | Freq: Every day | ORAL | 2 refills | Status: DC
Start: 2023-11-04 — End: 2024-03-28

## 2023-11-04 MED ORDER — HYDROXYZINE HCL 50 MG PO TABS
50.0000 mg | ORAL_TABLET | Freq: Three times a day (TID) | ORAL | 1 refills | Status: AC | PRN
Start: 2023-11-04 — End: ?

## 2023-11-04 MED ORDER — GABAPENTIN 300 MG PO CAPS
900.0000 mg | ORAL_CAPSULE | Freq: Three times a day (TID) | ORAL | 1 refills | Status: DC
Start: 2023-11-04 — End: 2024-03-28

## 2023-11-04 MED ORDER — PAROXETINE HCL 10 MG PO TABS: 10.0000 mg | ORAL_TABLET | Freq: Every day | ORAL | 0 refills | Status: DC

## 2023-11-04 NOTE — Patient Instructions (Addendum)
Cymbalta (duloxetine) was approved in 2004. Paxil (paroxetine) was approved in 1992, so we will start the lowest dose of Paxil.  Return to routinely taking gabapentin 3 times daily, I am hoping that this will improve your neuropathy some and we will continue to work on improving it.  It is very possible that the jerking at night is caused by trazodone.  Eventually, we may need to try stopping that medication to see if it has any effect on decreasing the jerking.

## 2023-11-04 NOTE — Assessment & Plan Note (Addendum)
PHQ-9 score 8 and PHQ 2 score 0.  Agreeable to trial of paroxetine 10 mg daily.  She does not want to see psychiatry as she had a bad experience in the past.  She states she was misdiagnosed with bipolar 1 and felt that the provider she saw was always wanting to increase medication unnecessarily.  In the future, may consider augmenting with Abilify, quetiapine, etc. We also discussed going to psychiatry in order to undergo Gene[employer]ight testing for more focused medication management, patient declines at this time.

## 2023-11-04 NOTE — Assessment & Plan Note (Signed)
Return to consistently taking gabapentin 900 mg 3 times daily before addressing neuropathy.  May consider referral to neurology.

## 2023-11-04 NOTE — Assessment & Plan Note (Signed)
For now, continue trazodone 200 mg nightly.  We also discussed that it may be worthwhile to stop taking trazodone to see if the muscle jerks improve.  If not related to medication, may require referral to neurology for further workup.

## 2023-11-04 NOTE — Assessment & Plan Note (Signed)
GAD-7 score 7.  Start paroxetine 10 mg daily and use hydroxyzine 50 mg as needed for breakthrough anxiety.  Follow-up in 6 weeks.

## 2023-11-04 NOTE — Progress Notes (Signed)
Established Patient Office Visit  Subjective   Patient ID: Stacie Sanders, female    DOB: 11/07/1971  Age: 52 y.o. MRN: [id number]  Chief Complaint  Patient presents with   Medication Management    HPI Stacie Sanders is a 52 y.o. female presenting today for follow up of medication management.  She has not been seen by primary care since 05/06/2022.  She reports that her neuropathy is no longer well-controlled with gabapentin.  She also notes that she has not been taking gabapentin consistently 3 times daily as prescribed.  Her biggest complaints are neuropathy with numbness and tingling spreading from her left scapula down her left arm.  She has also experienced pruritus bilaterally on her hands that occurs daily.  Nothing seems to worsen or improve symptoms.  She is also concerned about body jerks that occur at night both when falling asleep and waking her up from sleep.  It has been happening for months and is getting more frequent.  It happened several years ago and subsided after she discontinued several medications.  She did stop taking multiple medicines at once so she is unable to pinpoint which may have been the cause of this side effect.  She does know that trazodone was one of the medications that she was taking at the time and is also taking now.  She is taking trazodone 200 mg nightly but does not note any changes in the frequency of body jerks with changes in dosing.  Outpatient Medications Prior to Visit  Medication Sig   diphenoxylate-atropine (LOMOTIL) 2.5-0.025 MG tablet Take 1 tablet by mouth 3 (three) times daily as needed for diarrhea or loose stools.   EPINEPHrine (EPIPEN 2-PAK) 0.3 mg/0.3 mL IJ SOAJ injection Inject 0.3 mg into the muscle as needed for anaphylaxis.   ibuprofen (ADVIL,MOTRIN) 600 MG tablet Take 1 tablet (600 mg total) by mouth every 8 (eight) hours as needed.   meclizine (ANTIVERT) 25 MG tablet Take 1 tablet (25 mg total) by mouth 2 (two) times daily as  needed for dizziness.   Multiple Vitamin (MULTIVITAMIN WITH MINERALS) TABS tablet Take 1 tablet by mouth daily.   ondansetron (ZOFRAN-ODT) 4 MG disintegrating tablet Take 1 tablet (4 mg total) by mouth every 8 (eight) hours as needed for nausea or vomiting.   [DISCONTINUED] gabapentin (NEURONTIN) 300 MG capsule Take 3 capsules (900 mg total) by mouth 3 (three) times daily.   [DISCONTINUED] hydrOXYzine (ATARAX) 50 MG tablet Take 1 tablet (50 mg total) by mouth 3 (three) times daily as needed for anxiety.   [DISCONTINUED] traZODone (DESYREL) 100 MG tablet Take 1 tablet (100 mg total) by mouth at bedtime as needed and may repeat dose one time if needed for sleep.   [DISCONTINUED] ARIPiprazole (ABILIFY) 5 MG tablet Take 1.5 tablets (7.5 mg total) by mouth daily. Take 1.5 tablets po QD (Patient not taking: Reported on 11/04/2023)   [DISCONTINUED] Gauze Pads & Dressings (BAND-AID KLING ROLLED GAUZE LG) MISC Apply rolled gauze over clean bandage twice daily after cleaning wound. (Patient not taking: Reported on 11/04/2023)   [DISCONTINUED] silver sulfADIAZINE (SILVADENE) 1 % cream Apply 1 application topically daily. (Patient not taking: Reported on 11/04/2023)   [DISCONTINUED] Wound Dressings (VASELINE PETROLATUM GAUZE) PADS Apply over clean wound twice daily (Patient not taking: Reported on 11/04/2023)   No facility-administered medications prior to visit.    ROS Negative unless otherwise noted in HPI   Objective:     BP (!) 93/59   Pulse 72  Temp 98 F (36.7 C) (Oral)   Ht 5\' 5"  (1.651 m)   Wt 162 lb 3.2 oz (73.6 kg)   SpO2 97%   BMI 26.99 kg/m   Physical Exam Constitutional:      General: She is not in acute distress.    Appearance: Normal appearance.  HENT:     Head: Normocephalic and atraumatic.  Pulmonary:     Effort: Pulmonary effort is normal. No respiratory distress.  Musculoskeletal:     Cervical back: Normal range of motion.  Neurological:     General: No focal deficit  present.     Mental Status: She is alert and oriented to person, place, and time. Mental status is at baseline.  Psychiatric:        Mood and Affect: Mood normal.        Thought Content: Thought content normal.        Judgment: Judgment normal.       11/04/2023    2:07 PM 05/06/2022    8:13 AM 01/14/2022   11:42 AM  Depression screen PHQ 2/9  Decreased Interest 0 0 1  Down, Depressed, Hopeless 0 2 2  PHQ - 2 Score 0 2 3  Altered sleeping 3 1 1   Tired, decreased energy 2 1 1   Change in appetite 1 1 1   Feeling bad or failure about yourself  1 1 1   Trouble concentrating 1 2 2   Moving slowly or fidgety/restless 0 1 2  Suicidal thoughts 0 1 0  PHQ-9 Score 8 10 11   Difficult doing work/chores Somewhat difficult        11/04/2023    2:07 PM 05/06/2022    8:13 AM 01/14/2022   11:42 AM 01/08/2022    1:04 PM  GAD 7 : Generalized Anxiety Score  Nervous, Anxious, on Edge 1 1 2  0  Control/stop worrying 1 2 2  0  Worry too much - different things 1 2 2 1   Trouble relaxing 1 2 2 1   Restless 1 2 2  0  Easily annoyed or irritable 1 2 2 1   Afraid - awful might happen 1 1 2  0  Total GAD 7 Score 7 12 14 3   Anxiety Difficulty Somewhat difficult       Assessment & Plan:  Depression, unspecified depression type Assessment & Plan: PHQ-9 score 8 and PHQ 2 score 0.  Agreeable to trial of paroxetine 10 mg daily.  She does not want to see psychiatry as she had a bad experience in the past.  She states she was misdiagnosed with bipolar 1 and felt that the provider she saw was always wanting to increase medication unnecessarily.  In the future, may consider augmenting with Abilify, quetiapine, etc. We also discussed going to psychiatry in order to undergo Gene[employer] testing for more focused medication management, patient declines at this time.  Orders: -     PARoxetine HCl; Take 1 tablet (10 mg total) by mouth daily.  Dispense: 90 tablet; Refill: 0  Generalized anxiety disorder Assessment & Plan: GAD-7  score 7.  Start paroxetine 10 mg daily and use hydroxyzine 50 mg as needed for breakthrough anxiety.  Follow-up in 6 weeks.  Orders: -     PARoxetine HCl; Take 1 tablet (10 mg total) by mouth daily.  Dispense: 90 tablet; Refill: 0 -     hydrOXYzine HCl; Take 1 tablet (50 mg total) by mouth 3 (three) times daily as needed for anxiety.  Dispense: 30 tablet; Refill: 1  Insomnia due  to anxiety and fear Assessment & Plan: For now, continue trazodone 200 mg nightly.  We also discussed that it may be worthwhile to stop taking trazodone to see if the muscle jerks improve.  If not related to medication, may require referral to neurology for further workup.  Orders: -     traZODone HCl; Take 2 tablets (200 mg total) by mouth at bedtime.  Dispense: 60 tablet; Refill: 2  Neuralgia and neuritis, unspecified Assessment & Plan: Return to consistently taking gabapentin 900 mg 3 times daily before addressing neuropathy.  May consider referral to neurology.  Orders: -     Gabapentin; Take 3 capsules (900 mg total) by mouth 3 (three) times daily.  Dispense: 90 capsule; Refill: 1  Need for shingles vaccine -     Varicella-zoster vaccine IM    Return in about 6 weeks (around 12/16/2023) for follow-up for starting Paxil.    Melida Quitter, PA

## 2023-11-17 ENCOUNTER — Telehealth: Payer: Self-pay | Admitting: *Deleted

## 2023-11-17 DIAGNOSIS — F409 Phobic anxiety disorder, unspecified: Secondary | ICD-10-CM

## 2023-11-17 NOTE — Telephone Encounter (Signed)
Pt calling to say that she cannot take trazadone anymore, she said she is experiencing symptoms like jerking and it just not working for her.  She said she works 15+ hours a day and she is not getting any sleep. Please advise.

## 2023-11-17 NOTE — Telephone Encounter (Signed)
She can stop taking trazodone and let me know if the jerking at night stops.  If not, I can definitely put in a referral to neurology for further evaluation.

## 2023-11-18 MED ORDER — QUETIAPINE FUMARATE 25 MG PO TABS
25.0000 mg | ORAL_TABLET | Freq: Every day | ORAL | 1 refills | Status: DC
Start: 2023-11-18 — End: 2024-03-29

## 2023-11-18 NOTE — Telephone Encounter (Signed)
Informed pt of below and she wanted to know if you are going to send her in anything to help with sleep since the trazadone is not working.

## 2023-11-18 NOTE — Telephone Encounter (Signed)
Seroquel as medication with a number of uses, 1 of which is help with falling asleep and staying asleep with a low-dose.

## 2023-11-18 NOTE — Telephone Encounter (Signed)
We can talk about alternative options at her upcoming appointment in a few weeks, but until then I would like the trazodone to get out of her system and see if there is a decrease in the jerking that may improve her sleep.

## 2023-11-18 NOTE — Addendum Note (Signed)
Addended by: Saralyn Pilar on: 11/18/2023 01:36 PM   Modules accepted: Orders

## 2023-11-18 NOTE — Telephone Encounter (Signed)
Spoke with provider and she said that patient could try Seroquel.  Informed pt and she wanted to know what Seroquel is. Told patient I would send a message to provider so she can explain what the medication is and we would call her back once she responds.

## 2023-11-18 NOTE — Telephone Encounter (Signed)
Spoke with patient and provided information per provider's request. Patient has agreed to start medication and ask that it be sent to [facility] on [address]

## 2023-11-18 NOTE — Telephone Encounter (Signed)
Medication sent to requested pharmacy  Meds ordered this encounter  Medications   QUEtiapine (SEROQUEL) 25 MG tablet    Sig: Take 1-2 tablets (25-50 mg total) by mouth at bedtime. Start with 1 tablet at bedtime for 3 days, if no response then increase to 2 tablets at bedtime.    Dispense:  60 tablet    Refill:  1    Order Specific Question:   Supervising Provider    Answer:   Sandre Kitty [[id number]]

## 2023-11-23 NOTE — Telephone Encounter (Signed)
PA was approved on 11/18/23. Approval notification was faxed to [facility] on 11/18/23. Pharmacy needs to rerun claim.

## 2023-11-23 NOTE — Telephone Encounter (Signed)
Pt returned the call and information about the PA was given.

## 2023-12-16 ENCOUNTER — Ambulatory Visit: Payer: Medicaid Other | Admitting: Family Medicine

## 2024-02-04 ENCOUNTER — Encounter: Payer: Self-pay | Admitting: Family Medicine

## 2024-02-05 ENCOUNTER — Encounter: Payer: Self-pay | Admitting: Family Medicine

## 2024-03-10 ENCOUNTER — Emergency Department (HOSPITAL_COMMUNITY)

## 2024-03-10 ENCOUNTER — Other Ambulatory Visit: Payer: Self-pay

## 2024-03-10 ENCOUNTER — Emergency Department (HOSPITAL_COMMUNITY)
Admission: EM | Admit: 2024-03-10 | Discharge: 2024-03-10 | Disposition: A | Discharge status: Home / Self Care | Attending: Emergency Medicine | Admitting: Emergency Medicine

## 2024-03-10 ENCOUNTER — Encounter (HOSPITAL_COMMUNITY): Payer: Self-pay | Admitting: *Deleted

## 2024-03-10 ENCOUNTER — Telehealth: Payer: Self-pay

## 2024-03-10 ENCOUNTER — Ambulatory Visit: Payer: Self-pay | Admitting: Family Medicine

## 2024-03-10 DIAGNOSIS — R42 Dizziness and giddiness: Secondary | ICD-10-CM | POA: Diagnosis not present

## 2024-03-10 DIAGNOSIS — I10 Essential (primary) hypertension: Secondary | ICD-10-CM | POA: Diagnosis not present

## 2024-03-10 DIAGNOSIS — R410 Disorientation, unspecified: Secondary | ICD-10-CM | POA: Insufficient documentation

## 2024-03-10 DIAGNOSIS — H538 Other visual disturbances: Secondary | ICD-10-CM | POA: Diagnosis not present

## 2024-03-10 DIAGNOSIS — R4781 Slurred speech: Secondary | ICD-10-CM | POA: Diagnosis not present

## 2024-03-10 DIAGNOSIS — G459 Transient cerebral ischemic attack, unspecified: Secondary | ICD-10-CM | POA: Diagnosis not present

## 2024-03-10 DIAGNOSIS — R079 Chest pain, unspecified: Secondary | ICD-10-CM | POA: Diagnosis not present

## 2024-03-10 DIAGNOSIS — R519 Headache, unspecified: Secondary | ICD-10-CM | POA: Diagnosis not present

## 2024-03-10 DIAGNOSIS — F8081 Childhood onset fluency disorder: Secondary | ICD-10-CM

## 2024-03-10 LAB — COMPREHENSIVE METABOLIC PANEL
ALT: 115 U/L — ABNORMAL HIGH (ref 0–44)
AST: 117 U/L — ABNORMAL HIGH (ref 15–41)
Albumin: 3.9 g/dL (ref 3.5–5.0)
Alkaline Phosphatase: 100 U/L (ref 38–126)
Anion gap: 8 (ref 5–15)
BUN: 12 mg/dL (ref 6–20)
CO2: 24 mmol/L (ref 22–32)
Calcium: 9 mg/dL (ref 8.9–10.3)
Chloride: 107 mmol/L (ref 98–111)
Creatinine, Ser: 0.78 mg/dL (ref 0.44–1.00)
GFR, Estimated: >60 mL/min (ref >60)
Glucose, Bld: 98 mg/dL (ref 70–99)
Potassium: 4.2 mmol/L (ref 3.5–5.1)
Sodium: 139 mmol/L (ref 135–145)
Total Bilirubin: 0.4 mg/dL (ref 0.0–1.2)
Total Protein: 6.6 g/dL (ref 6.5–8.1)

## 2024-03-10 LAB — CBC WITH DIFFERENTIAL/PLATELET
Abs Immature Granulocytes: 0.02 10*3/uL (ref 0.00–0.07)
Basophils Absolute: 0 10*3/uL (ref 0.0–0.1)
Basophils Relative: 0 %
Eosinophils Absolute: 0.2 10*3/uL (ref 0.0–0.5)
Eosinophils Relative: 4 %
HCT: 41.6 % (ref 36.0–46.0)
Hemoglobin: 13.6 g/dL (ref 12.0–15.0)
Immature Granulocytes: 0 %
Lymphocytes Relative: 30 %
Lymphs Abs: 1.7 10*3/uL (ref 0.7–4.0)
MCH: 31.2 pg (ref 26.0–34.0)
MCHC: 32.7 g/dL (ref 30.0–36.0)
MCV: 95.4 fL (ref 80.0–100.0)
Monocytes Absolute: 0.5 10*3/uL (ref 0.1–1.0)
Monocytes Relative: 8 %
Neutro Abs: 3.3 10*3/uL (ref 1.7–7.7)
Neutrophils Relative %: 58 %
Platelets: 319 10*3/uL (ref 150–400)
RBC: 4.36 MIL/uL (ref 3.87–5.11)
RDW: 12.6 % (ref 11.5–15.5)
WBC: 5.8 10*3/uL (ref 4.0–10.5)
nRBC: 0 % (ref 0.0–0.2)

## 2024-03-10 LAB — AMMONIA: Ammonia: 29 umol/L (ref 9–35)

## 2024-03-10 LAB — TROPONIN I (HIGH SENSITIVITY)
Troponin I (High Sensitivity): <2 ng/L (ref <18)
Troponin I (High Sensitivity): 3 ng/L (ref <18)

## 2024-03-10 LAB — HCG, SERUM, QUALITATIVE: Preg, Serum: NEGATIVE

## 2024-03-10 LAB — ACETAMINOPHEN LEVEL: Acetaminophen (Tylenol), Serum: <10 ug/mL — ABNORMAL LOW (ref 10–30)

## 2024-03-10 LAB — ETHANOL: Alcohol, Ethyl (B): <10 mg/dL (ref <10)

## 2024-03-10 MED ORDER — LORAZEPAM 2 MG/ML IJ SOLN
1.0000 mg | Freq: Once | INTRAMUSCULAR | Status: AC | PRN
  Administered 2024-03-10: 1 mg via INTRAVENOUS
  Filled 2024-03-10: qty 1

## 2024-03-10 MED ORDER — LORAZEPAM 2 MG/ML IJ SOLN: 1.0000 mg | Freq: Once | INTRAMUSCULAR | Status: DC | PRN

## 2024-03-10 MED ORDER — GADOBUTROL 1 MMOL/ML IV SOLN
7.0000 mL | Freq: Once | INTRAVENOUS | Status: AC | PRN
Start: 2024-03-10 — End: 2024-03-10
  Administered 2024-03-10: 7 mL via INTRAVENOUS

## 2024-03-10 MED ORDER — METHYLPREDNISOLONE 4 MG PO TBPK: ORAL_TABLET | ORAL | 0 refills | Status: AC

## 2024-03-10 NOTE — ED Provider Notes (Signed)
 [place] EMERGENCY DEPARTMENT AT [facility] Provider Note   CSN: [id number] Arrival date & time: 03/10/24  1155     History  Chief Complaint  Patient presents with   Stuttering    Stacie Sanders is a 53 y.o. female.  HPI Patient with dizziness.  States come and go.  States will feel the rocking motion.  Also states difficulty speaking with the episode.  States that episodes will last around 2 hours.  No headaches necessarily with it but at times does get headaches.  States she is also been more confused.  Particular with the episodes. Patient does have anxiety at times.   Past Medical History:  Diagnosis Date   Allergic rhinitis    Bipolar 1 disorder (HCC)    Borderline diabetes mellitus    Colon polyp    Depression    Dyspnea on exertion    Esophageal stricture    scheduled for EGD and dilation 03-18-2016   GAD (generalized anxiety disorder)    History of acute respiratory failure    12-29-2015  due to polysubstance overdose--  pt intubated --  resolved    History of febrile seizure    x1  01/ 2017--  none since   History of kidney stones    staghorn   History of suicide attempt    12-29-2015  -- overdose klonopin, xanax, alcohol--  acute respirtory failure and acute encenphalopathy -- both resolved   IBS (irritable bowel syndrome)    Irregular menstrual cycle    Renal calculi    bilateral   Planned at the end of Home Medications Prior to Admission medications   Medication Sig Start Date End Date Taking? Authorizing Provider  diphenoxylate-atropine (LOMOTIL) 2.5-0.025 MG tablet Take 1 tablet by mouth 3 (three) times daily as needed for diarrhea or loose stools. 01/08/22   Carlean Jews, NP  EPINEPHrine (EPIPEN 2-PAK) 0.3 mg/0.3 mL IJ SOAJ injection Inject 0.3 mg into the muscle as needed for anaphylaxis. 08/28/21   Carlean Jews, NP  gabapentin (NEURONTIN) 300 MG capsule Take 3 capsules (900 mg total) by mouth 3 (three) times daily. 11/04/23    Melida Quitter, PA  hydrOXYzine (ATARAX) 50 MG tablet Take 1 tablet (50 mg total) by mouth 3 (three) times daily as needed for anxiety. 11/04/23   Melida Quitter, PA  ibuprofen (ADVIL,MOTRIN) 600 MG tablet Take 1 tablet (600 mg total) by mouth every 8 (eight) hours as needed. 08/04/18   Danford, Orpha Bur D, NP  meclizine (ANTIVERT) 25 MG tablet Take 1 tablet (25 mg total) by mouth 2 (two) times daily as needed for dizziness. 06/03/22   Carlean Jews, NP  Multiple Vitamin (MULTIVITAMIN WITH MINERALS) TABS tablet Take 1 tablet by mouth daily.    [provider]  ondansetron (ZOFRAN-ODT) 4 MG disintegrating tablet Take 1 tablet (4 mg total) by mouth every 8 (eight) hours as needed for nausea or vomiting. 01/08/22   Carlean Jews, NP  PARoxetine (PAXIL) 10 MG tablet Take 1 tablet (10 mg total) by mouth daily. 11/04/23   Melida Quitter, PA  QUEtiapine (SEROQUEL) 25 MG tablet Take 1-2 tablets (25-50 mg total) by mouth at bedtime. Start with 1 tablet at bedtime for 3 days, if no response then increase to 2 tablets at bedtime. 11/18/23   Melida Quitter, PA  traZODone (DESYREL) 100 MG tablet Take 2 tablets (200 mg total) by mouth at bedtime. 11/04/23   Melida Quitter, PA  Allergies    Other, Ed-apap [acetaminophen], Fentanyl, Percocet [oxycodone-acetaminophen], and Robaxin [methocarbamol]    Review of Systems   Review of Systems  Physical Exam Updated Vital Signs BP 118/72 (BP Location: Left Arm)   Pulse 72   Temp 97.8 F (36.6 C) (Oral)   Resp 18   Wt 73.5 kg   SpO2 100%   BMI 26.96 kg/m  Physical Exam Vitals and nursing note reviewed.  HENT:     Head: Atraumatic.  Eyes:     Pupils: Pupils are equal, round, and reactive to light.  Cardiovascular:     Rate and Rhythm: Regular rhythm.  Chest:     Chest wall: No tenderness.  Abdominal:     Tenderness: There is no abdominal tenderness.  Musculoskeletal:        General: No tenderness.  Neurological:     General:  No focal deficit present.     Mental Status: She is alert and oriented to person, place, and time.     ED Results / Procedures / Treatments   Labs (all labs ordered are listed, but only abnormal results are displayed) Labs Reviewed  COMPREHENSIVE METABOLIC PANEL - Abnormal; Notable for the following components:      Result Value   AST 117 (*)    ALT 115 (*)    All other components within normal limits  CBC WITH DIFFERENTIAL/PLATELET  HCG, SERUM, QUALITATIVE  URINALYSIS, ROUTINE W REFLEX MICROSCOPIC  AMMONIA  ACETAMINOPHEN LEVEL  ETHANOL  TSH  TROPONIN I (HIGH SENSITIVITY)  TROPONIN I (HIGH SENSITIVITY)    EKG EKG Interpretation Date/Time:  Thursday March 10 2024 12:03:43 EDT Ventricular Rate:  65 PR Interval:  148 QRS Duration:  84 QT Interval:  412 QTC Calculation: 428 R Axis:   67  Text Interpretation: Normal sinus rhythm Normal ECG When compared with ECG of 26-Oct-2018 07:02, T waves more prominant Confirmed by Benjiman Core [phone]) on 03/10/2024 3:31:29 PM  Radiology CT Head Wo Contrast Result Date: 03/10/2024 CLINICAL DATA:  53 year old female TIA. EXAM: CT HEAD WITHOUT CONTRAST TECHNIQUE: Contiguous axial images were obtained from the base of the skull through the vertex without intravenous contrast. RADIATION DOSE REDUCTION: This exam was performed according to the departmental dose-optimization program which includes automated exposure control, adjustment of the mA and/or kV according to patient size and/or use of iterative reconstruction technique. COMPARISON:  Brain MRI 06/15/2018.  Head CT 07/28/2018. FINDINGS: Brain: Normal cerebral volume. No midline shift, ventriculomegaly, mass effect, evidence of mass lesion, intracranial hemorrhage or evidence of cortically based acute infarction. Gray-white matter differentiation is within normal limits throughout the brain. Vascular: No suspicious intracranial vascular hyperdensity. Mild Calcified atherosclerosis at the  skull base. Skull: Intact, negative. Sinuses/Orbits: Visualized paranasal sinuses and mastoids are well aerated. Other: Mild rightward gaze. Visualized scalp soft tissues are within normal limits. IMPRESSION: Normal for age noncontrast Head CT. Electronically Signed   By: Odessa Fleming M.D.   On: 03/10/2024 14:05    Procedures Procedures    Medications Ordered in ED Medications  LORazepam (ATIVAN) injection 1 mg (1 mg Intravenous Given 03/10/24 1532)    ED Course/ Medical Decision Making/ A&P                                 Medical Decision Making Amount and/or Complexity of Data Reviewed Labs: ordered. Radiology: ordered.  Risk Prescription drug management.    patient with dizziness.  He states  some difficulty walking around bring with him.  Comes and goes.  At this time patient does not have deficits.  Good neurologic exam.  Differential diagnoses long but includes causes such as stroke, multiple sclerosis, also psychologic issues.  Basic blood work showed LFTs mildly elevated.  Will add alcohol and Tylenol levels.  Will get MRI to evaluate further.          Final Clinical Impression(s) / ED Diagnoses Final diagnoses:  None    Rx / DC Orders ED Discharge Orders     None         Benjiman Core, MD 03/10/24 1534

## 2024-03-10 NOTE — Discharge Instructions (Signed)
 Medrol for the next 6 days  Thank you for allowing [place] to treat you in the emergency department today.  After reviewing your examination and potential testing that was done it appears that you are safe to go home.  I would like for you to follow-up with your doctor within the next several days, have them obtain your records and follow-up with them to review all potential tests and results from your visit.  If you should develop severe or worsening symptoms return to the emergency department immediately

## 2024-03-10 NOTE — ED Provider Notes (Signed)
 Testing has been unremarkable, MRI without signs of acute abnormalities, lab work is been unremarkable, the patient is ambulatory without difficulty.  She is frustrated by this chronic vertigo which has been going on for quite some time.  She had been prescribed meclizine which she states she takes and only taking 3 that time will help her.  This is not a constant symptom.  MRI reviewed with the patient, no acute findings, home with Medrol Dosepak, patient agreeable   Eber Hong, MD 03/10/24 [phone]

## 2024-03-10 NOTE — ED Notes (Signed)
CBG 104 

## 2024-03-10 NOTE — ED Provider Triage Note (Signed)
 Emergency Medicine Provider Triage Evaluation Note  Trianna Lupien , a 53 y.o. female  was evaluated in triage.  Pt complains of stuttering speech.  Patient reports intermittent episodes of lightheadedness, blurred vision, and issues with word finding for the past 2 weeks.  Symptoms come and go on around.  She is not experiencing any symptoms at this time.  She is teary-eyed on exam.  Review of Systems  Positive: Intermittent lightheadedness, slurred speech Negative: CP, shortness of breath  Physical Exam  BP 111/80   Pulse 77   Temp 98.1 F (36.7 C)   Resp 18   Wt 73.5 kg   SpO2 100% Comment: Simultaneous filing. User may not have seen previous data.  BMI 26.96 kg/m  Gen:   Awake, no distress   Resp:  Normal effort  MSK:   Moves extremities without difficulty  Other:  No neurofocal deficits on exam,  Medical Decision Making  Medically screening exam initiated at 12:12 PM.  Appropriate orders placed.  Honour Schwieger was informed that the remainder of the evaluation will be completed by another provider, this initial triage assessment does not replace that evaluation, and the importance of remaining in the ED until their evaluation is complete.     Maxwell Marion, PA-C 03/10/24 1217

## 2024-03-10 NOTE — ED Triage Notes (Addendum)
 BIB [facility] from work for intermittent stress, emotional, anxious, hazy vision, stuttering speech, word finding issues. sx worse with stress. recurrent for over 2 weeks, h/o similar. Tearful now. Stroke scale negative for EMS. No droop or weakness. Speech clear. VSS. CBG 115. 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 NSL 18g, L AC

## 2024-03-10 NOTE — Telephone Encounter (Signed)
 Pt was transferred to me from Triage line.   Pt refused to go to ED however while on the phone with the pt I was asking Dr. Constance Goltz to see what his recommendations where he said that he would see her but would refer her to the ED as well.Pt symptoms are vision blurred, speech slurred, dizziness, and Rt side chest pain started while on the phone. Pt started to stutter real bad and I convinced her to allow me to call EMS for her and that her insurance should cover that bill if there would be one but the most important thing right now was to make sure she was ok.  I called 911 and remained on the phone until they arrived.

## 2024-03-10 NOTE — Telephone Encounter (Signed)
 Chief Complaint: Neurologic deficit Symptoms: weakness, vision changes, slurred speech Frequency: About 2 weeks Pertinent Negatives: Patient denies numbness, paralysis Disposition: [x] ED /[] Urgent Care (no appt availability in office) / [] Appointment(In office/virtual)/ []  [facility]/ [] Home Care/ [x] Refused Recommended Disposition /[] [facility]/ []  Follow-up with PCP Additional Notes: Pt reports slurred speech, vertigo, weakness, vision changes, daily HA x 2 weeks with symptoms increasing in frequency and length over the last few days. Pt denies arm drift, paralysis, unilateral numbness/weakness. Pt advised ED for eval/treat, pt declines and requests OV. This RN spoke to [place] at [facility] who will speak to pt to advise what Dr. Constance Goltz recommends.    Copied from CRM [id number]. Topic: Clinical - Red Word Triage >> Mar 10, 2024 10:37 AM Gildardo Pounds wrote: Red Word that prompted transfer to Nurse Triage: dizzy, slurred speech, and vision blurry. Callback number [phone] Reason for Disposition  Headache  (and neurologic deficit)  Answer Assessment - Initial Assessment Questions 1. SYMPTOM: "What is the main symptom you are concerned about?" (e.g., weakness, numbness)      Dizziness, slurred speech, blurry vision, unsteady gait 2. ONSET: "When did this start?" (minutes, hours, days; while sleeping)     A few weeks 3. LAST NORMAL: "When was the last time you (the patient) were normal (no symptoms)?"     Early AM 4. PATTERN "Does this come and go, or has it been constant since it started?"  "Is it present now?"     Comes and goes 5. CARDIAC SYMPTOMS: "Have you had any of the following symptoms: chest pain, difficulty breathing, palpitations?"     None 6. NEUROLOGIC SYMPTOMS: "Have you had any of the following symptoms: headache, dizziness, vision loss, double vision, changes in speech, unsteady on your feet?"     Dizziness, blurry vision, unsteady on feet, slurred speech 7.  OTHER SYMPTOMS: "Do you have any other symptoms?"     weakness  Protocols used: Neurologic Deficit-A-AH

## 2024-03-10 NOTE — ED Notes (Signed)
 Patient transported to MRI

## 2024-03-11 ENCOUNTER — Telehealth: Payer: Self-pay

## 2024-03-11 NOTE — Telephone Encounter (Signed)
 Copied from CRM [id number]. Topic: Appointments - Appointment Scheduling >> Mar 11, 2024 11:01 AM Truddie Crumble wrote: Patient/patient representative is calling to schedule an appointment. Refer to attachments for appointment information. Patient called stating she was told to make a hospital follow-up but her provider does not have anything available

## 2024-03-14 LAB — CBG MONITORING, ED: Glucose-Capillary: 104 mg/dL — ABNORMAL HIGH (ref 70–99)

## 2024-03-14 NOTE — Telephone Encounter (Signed)
 Pt has appt on 03/16/24

## 2024-03-15 ENCOUNTER — Telehealth: Payer: Self-pay | Admitting: *Deleted

## 2024-03-15 NOTE — Telephone Encounter (Signed)
 Copied from CRM [id number]. Topic: Appointments - Scheduling Inquiry for Clinic >> Mar 15, 2024  2:04 PM Gaetano Hawthorne wrote: Reason for CRM: Patient was seen in the Emergency Room on 03/13 (see chart for note regarding Stuttering and Vertigo) - Patient has a work conflict and is no longer able to keep their appointment for tomorrow. They would like to reschedule [facility] Agent was not able to pull up anything until May - Please reach out to the patient as we were able to book an appointment within the 7-14 day window for a ER/Hospital Follow up.

## 2024-03-15 NOTE — Telephone Encounter (Signed)
 Appt has been rescheduled

## 2024-03-16 ENCOUNTER — Ambulatory Visit: Admitting: Family Medicine

## 2024-03-28 ENCOUNTER — Encounter: Payer: Self-pay | Admitting: Family Medicine

## 2024-03-28 ENCOUNTER — Ambulatory Visit: Admitting: Family Medicine

## 2024-03-28 VITALS — BP 113/69 | HR 69 | Ht 65.0 in | Wt 156.4 lb

## 2024-03-28 DIAGNOSIS — F409 Phobic anxiety disorder, unspecified: Secondary | ICD-10-CM | POA: Diagnosis not present

## 2024-03-28 DIAGNOSIS — F32A Depression, unspecified: Secondary | ICD-10-CM

## 2024-03-28 DIAGNOSIS — T782XXD Anaphylactic shock, unspecified, subsequent encounter: Secondary | ICD-10-CM | POA: Diagnosis not present

## 2024-03-28 DIAGNOSIS — F5105 Insomnia due to other mental disorder: Secondary | ICD-10-CM

## 2024-03-28 DIAGNOSIS — Z23 Encounter for immunization: Secondary | ICD-10-CM | POA: Diagnosis not present

## 2024-03-28 DIAGNOSIS — R42 Dizziness and giddiness: Secondary | ICD-10-CM | POA: Diagnosis not present

## 2024-03-28 DIAGNOSIS — R112 Nausea with vomiting, unspecified: Secondary | ICD-10-CM | POA: Diagnosis not present

## 2024-03-28 DIAGNOSIS — R7989 Other specified abnormal findings of blood chemistry: Secondary | ICD-10-CM | POA: Diagnosis not present

## 2024-03-28 DIAGNOSIS — F411 Generalized anxiety disorder: Secondary | ICD-10-CM | POA: Diagnosis not present

## 2024-03-28 DIAGNOSIS — R2990 Unspecified symptoms and signs involving the nervous system: Secondary | ICD-10-CM

## 2024-03-28 DIAGNOSIS — R197 Diarrhea, unspecified: Secondary | ICD-10-CM

## 2024-03-28 DIAGNOSIS — M792 Neuralgia and neuritis, unspecified: Secondary | ICD-10-CM

## 2024-03-28 MED ORDER — DIPHENOXYLATE-ATROPINE 2.5-0.025 MG PO TABS: 1.0000 | ORAL_TABLET | Freq: Three times a day (TID) | ORAL | 0 refills | Status: AC | PRN

## 2024-03-28 MED ORDER — MECLIZINE HCL 25 MG PO TABS: 25.0000 mg | ORAL_TABLET | Freq: Two times a day (BID) | ORAL | 1 refills | Status: AC | PRN

## 2024-03-28 MED ORDER — PROPRANOLOL HCL 20 MG PO TABS: 20.0000 mg | ORAL_TABLET | Freq: Two times a day (BID) | ORAL | 1 refills | Status: DC

## 2024-03-28 MED ORDER — GABAPENTIN 300 MG PO CAPS: 900.0000 mg | ORAL_CAPSULE | Freq: Three times a day (TID) | ORAL | 1 refills | Status: DC

## 2024-03-28 MED ORDER — EPINEPHRINE 0.3 MG/0.3ML IJ SOAJ: 0.3000 mg | INTRAMUSCULAR | 1 refills | Status: AC | PRN

## 2024-03-28 MED ORDER — PAROXETINE HCL 10 MG PO TABS
10.0000 mg | ORAL_TABLET | Freq: Every day | ORAL | 0 refills | Status: AC
Start: 2024-03-28 — End: ?

## 2024-03-28 MED ORDER — ONDANSETRON 4 MG PO TBDP: 4.0000 mg | ORAL_TABLET | Freq: Three times a day (TID) | ORAL | 0 refills | Status: AC | PRN

## 2024-03-28 MED ORDER — TRAZODONE HCL 100 MG PO TABS: 200.0000 mg | ORAL_TABLET | Freq: Every day | ORAL | 2 refills | Status: DC

## 2024-03-28 NOTE — Patient Instructions (Addendum)
 It was nice to see you today,  We addressed the following topics today: -I am sending in the panel which may help with anxiety and also migraines, which could be contributing to your symptoms. - Take this medicine 1 tablet twice a day. - I have sent in prescriptions of your other medications. - I am going to order several tests to look for causes of your symptoms and refer you to neurology.  Someone should call you within the next 3 weeks. - I would like you to follow-up with me in 6 weeks. - You can enroll for health insurance from the open market place before October if you have a qualifying reason.  Loss of coverage for Medicaid counts for this.  Use the website below and call the number listed for more information. [email or web address]  Have a great day,  Frederic Jericho, MD

## 2024-03-28 NOTE — Progress Notes (Unsigned)
   Established Patient Office Visit  Subjective   Patient ID: Stacie Sanders, female    DOB: 1971/05/02  Age: 53 y.o. MRN: [id number]  No chief complaint on file.   HPI  Intermittent dizziness.  2 hours.  Confusion.   Seeing lights - glares, headaches.  .  Blurred.  Come and go.  Never paid attention to if they occur at the same time.  Feels like a bad muscle aches.  Nausea sometimes.  Speech slurred when this occurs.  Sways - uncertain to which side. No falls.    Bladder - hx of stress incontinence.  Complete incontinence.  More ofthen that she doesn't know.  Not wearing depends.  Bowel incontinence last week.  Only a few times.    Says she is having seizures at night.  Son is epileptic.  Full body movement.  Before and during sleep.    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   Neurologist - hasn't seen one.    Migraines complex?    Needs insurance.    The ASCVD Risk score (Arnett DK, et al., 2019) failed to calculate for the following reasons:   Cannot find a previous HDL lab   Cannot find a previous total cholesterol lab  Health Maintenance Due  Topic Date Due   Pneumococcal Vaccine 31-13 Years old (1 of 2 - PCV) Never done   Hepatitis C Screening  Never done   Cervical Cancer Screening (HPV/Pap Cotest)  12/15/2022   DTaP/Tdap/Td (2 - Td or Tdap) 12/29/2022   MAMMOGRAM  02/21/2023   COVID-19 Vaccine (1 - 2024-25 season) Never done   Zoster Vaccines- Shingrix (2 of 2) 12/30/2023      Objective:     There were no vitals taken for this visit. {Vitals History (Optional):23777}  Physical Exam   No results found for any visits on 03/28/24.      Assessment & Plan:   There are no diagnoses linked to this encounter.   No follow-ups on file.    Sandre Kitty, MD

## 2024-03-29 DIAGNOSIS — R2990 Unspecified symptoms and signs involving the nervous system: Secondary | ICD-10-CM | POA: Insufficient documentation

## 2024-03-29 DIAGNOSIS — R7989 Other specified abnormal findings of blood chemistry: Secondary | ICD-10-CM | POA: Insufficient documentation

## 2024-03-29 NOTE — Assessment & Plan Note (Signed)
 Patient has scattered neurologic complaints that include scotoma, visual changes, headaches, slurred speech, confusion, imbalance/ataxia, intermittent complete loss of bowel and bladder continence.  These do not all occur at the same time.  Patient is unsure what triggers them.  Brain MRI was similar to 2019 and negative for any potential acute issues.  Will test for wide variety of potential causes including autoimmune, infectious, nutritional deficiency.  Will refer to neurology.  Do not believe it is related to her current medications.  She is on a low-dose of paroxetine and has been on higher doses in the past.  She takes trazodone as well.  No longer takes Seroquel.  That was briefly restarted at a low dose by her PCP recently, but in the past she has been on significantly higher doses.

## 2024-03-29 NOTE — Assessment & Plan Note (Signed)
 Both AST and ALT are in the low 100s after being previously normal the past multiple readings.  No RUQ tenderness.  Uncertain if this is related to her other neurologic complaints.  Will repeat CMP, get hep B and hep C testing, iron and ceruloplasmin testing.

## 2024-04-11 ENCOUNTER — Other Ambulatory Visit

## 2024-04-11 DIAGNOSIS — R7989 Other specified abnormal findings of blood chemistry: Secondary | ICD-10-CM

## 2024-04-11 DIAGNOSIS — R2990 Unspecified symptoms and signs involving the nervous system: Secondary | ICD-10-CM

## 2024-05-11 ENCOUNTER — Ambulatory Visit: Admitting: Family Medicine

## 2024-06-13 ENCOUNTER — Other Ambulatory Visit: Payer: Self-pay | Admitting: Family Medicine

## 2024-06-13 DIAGNOSIS — F32A Depression, unspecified: Secondary | ICD-10-CM

## 2024-06-13 DIAGNOSIS — F411 Generalized anxiety disorder: Secondary | ICD-10-CM

## 2024-08-11 ENCOUNTER — Telehealth: Payer: Self-pay

## 2024-08-11 NOTE — Telephone Encounter (Signed)
 Spoke with pt about rescheduling the appointment for labs that was missed and also the follow up with Dr. Chandra.   Pt reports that she is awaiting knee surgery from a injury at work. Will call back to reschedule.

## 2025-01-13 ENCOUNTER — Ambulatory Visit: Payer: Self-pay

## 2025-01-13 NOTE — Telephone Encounter (Signed)
 FYI Only or Action Required?: Action required by provider: request for appointment.  Patient was last seen in primary care on 03/28/2024 by Chandra Toribio POUR, MD.  Called Nurse Triage reporting Headache and Depression.  Symptoms began a week ago.  Interventions attempted: Nothing.  Symptoms are: gradually worsening.  Triage Disposition: See PCP When Office is Open (Within 3 Days), See Physician Within 24 Hours  Patient/caregiver understands and will follow disposition?: Yes  Copied from CRM #[id number]. Topic: Clinical - Red Word Triage >> Jan 13, 2025 12:56 PM Stacie Sanders wrote: Red Word that prompted transfer to Nurse Triage: Patient is calling to report severe headaches. Patient reporting that she is not able to function not able to work. Reporting jerking at night, and restlessness. Patient has not had  traZODone  (DESYREL ) 100 MG tablet [[id number]] in 1 one week.   Please warm transfer Red Word call to Nurse Triage and then click Send Message and Close CRM. Reason for Disposition  [1] Depression AND [2] getting worse (e.g., sleeping poorly, less able to do activities of daily living)  [1] MILD-MODERATE headache AND [2] present > 3 days (72 hours) AND [3] no improvement after using Care Advice  Answer Assessment - Initial Assessment Questions Pt became tearful saying she needs to be seen as soon as possible and states she has not been a good patient but is going to be from here on out. She is out of trazadone for about a week. She states she feels like she is having a nervous breakdown due to life and family stressors. She has also been having daily headaches. Denies any higher acuity symptoms.  Please assist with scheduling and medication refill. Next available was next Friday (1.23.26). RN did recommend pt go to ER or behavioral health UC if symptoms get any worse. Pt stated understanding.     1. CONCERN: What happened that made you call today?     Having depression 2. DEPRESSION  SYMPTOM SCREENING: How are you feeling overall? (e.g., decreased energy, increased sleeping or difficulty sleeping, difficulty concentrating, feelings of sadness, guilt, hopelessness, or worthlessness)     Difficulty doing daily activities, tearful 3. RISK OF HARM - SUICIDAL IDEATION:  Do you ever have thoughts of hurting or killing yourself?  (e.g., yes, no, no but preoccupation with thoughts about death)     denies 4. RISK OF HARM - HOMICIDAL IDEATION:  Do you ever have thoughts of hurting or killing someone else?  (e.g., yes, no, no but preoccupation with thoughts about death)     Denies 5. FUNCTIONAL IMPAIRMENT: How have things been going for you overall? Have you had more difficulty than usual doing your normal daily activities?  (e.g., better, same, worse; self-care, school, work, interactions)     States sometimes 6. SUPPORT: Who is with you now? Who do you live with? Do you have family or friends who you can talk to?      A little bit of support 7. THERAPIST: Do you have a counselor or therapist? If Yes, ask: What is their name?     no 8. STRESSORS: Has there been any new stress or recent changes in your life?     Family, life 9. ALCOHOL USE OR SUBSTANCE USE (DRUG USE): Do you drink alcohol or use any illegal drugs?     denies 10. OTHER: Do you have any other physical symptoms right now? (e.g., fever)       headache  Answer Assessment - Initial Assessment Questions Headache intermittently  throught out the day but they have been every day. Pain currently is 3/10.    1. LOCATION: Where does it hurt?      general 2. ONSET: When did the headache start? (e.g., minutes, hours, days)      Intermittent daily 3. PATTERN: Does the pain come and go, or has it been constant since it started?     intermittent 4. SEVERITY: How bad is the pain? and What does it keep you from doing?  (e.g., Scale 1-10; mild, moderate, or severe)     3 currently 5. RECURRENT  SYMPTOM: Have you ever had headaches before? If Yes, ask: When was the last time? and What happened that time?      yes 6. CAUSE: What do you think is causing the headache?     Thinks from not having medication 7. MIGRAINE: Have you been diagnosed with migraine headaches? If Yes, ask: Is this headache similar?       8. HEAD INJURY: Has there been any recent injury to your head?      denies 9. OTHER SYMPTOMS: Do you have any other symptoms? (e.g., fever, stiff neck, eye pain, sore throat, cold symptoms)     depression  Protocols used: Depression-A-AH, Headache-A-AH

## 2025-01-16 ENCOUNTER — Telehealth: Payer: Self-pay | Admitting: *Deleted

## 2025-01-16 ENCOUNTER — Other Ambulatory Visit: Payer: Self-pay

## 2025-01-16 DIAGNOSIS — F409 Phobic anxiety disorder, unspecified: Secondary | ICD-10-CM

## 2025-01-16 MED ORDER — TRAZODONE HCL 100 MG PO TABS: 200.0000 mg | ORAL_TABLET | Freq: Every day | ORAL | 0 refills | Status: AC

## 2025-01-16 NOTE — Telephone Encounter (Signed)
 Please advise.

## 2025-01-16 NOTE — Telephone Encounter (Signed)
 Called and spoke with patient. She's scheduled for OV - 01/20/25 at 10:30.

## 2025-01-16 NOTE — Telephone Encounter (Signed)
 Copied from CRM #[id number]. Topic: Clinical - Medication Refill >> Jan 13, 2025 12:54 PM [name] wrote: Medication: traZODone  (DESYREL ) 100 MG tablet [[id number]]  Has the patient contacted their pharmacy? Yes (Agent: If no, request that the patient contact the pharmacy for the refill. If patient does not wish to contact the pharmacy document the reason why and proceed with request.) (Agent: If yes, when and what did the pharmacy advise?)  This is the patient's preferred pharmacy:  [facility] [address] [place], [place] - [address] [place] [address] Phone: [phone] Fax: [phone]  Is this the correct pharmacy for this prescription? Yes If no, delete pharmacy and type the correct one.   Has the prescription been filled recently? Yes  Is the patient out of the medication? Yes  Has the patient been seen for an appointment in the last year OR does the patient have an upcoming appointment? Yes  Can we respond through MyChart? Yes  Agent: Please be advised that Rx refills may take up to 3 business days. We ask that you follow-up with your pharmacy. >> Jan 13, 2025  3:02 PM Delon T wrote: Checking status of refill, advised it can take up to three days for refill request

## 2025-01-16 NOTE — Telephone Encounter (Signed)
 I have sent in a 1 month supply of trazodone . Please let patient know that we will not continuing refilling this without an appointment.  Please schedule her for the open slot on 01/20/25 and advise that if depression or HA worsens before then, she needs to go to ER immediately.

## 2025-01-20 ENCOUNTER — Ambulatory Visit

## 2025-01-20 VITALS — BP 108/66 | HR 84 | Temp 98.0°F | Ht 65.0 in | Wt 149.1 lb

## 2025-01-20 DIAGNOSIS — F411 Generalized anxiety disorder: Secondary | ICD-10-CM | POA: Diagnosis not present

## 2025-01-20 DIAGNOSIS — M792 Neuralgia and neuritis, unspecified: Secondary | ICD-10-CM | POA: Diagnosis not present

## 2025-01-20 DIAGNOSIS — G629 Polyneuropathy, unspecified: Secondary | ICD-10-CM

## 2025-01-20 DIAGNOSIS — F32A Depression, unspecified: Secondary | ICD-10-CM

## 2025-01-20 MED ORDER — ARIPIPRAZOLE 2 MG PO TABS: 2.0000 mg | ORAL_TABLET | Freq: Every day | ORAL | 2 refills | Status: AC

## 2025-01-20 MED ORDER — PROPRANOLOL HCL 20 MG PO TABS: 20.0000 mg | ORAL_TABLET | Freq: Two times a day (BID) | ORAL | 1 refills | Status: AC

## 2025-01-20 MED ORDER — PREGABALIN 50 MG PO CAPS: 50.0000 mg | ORAL_CAPSULE | Freq: Two times a day (BID) | ORAL | 0 refills | Status: AC

## 2025-01-20 MED ORDER — BREXPIPRAZOLE 0.25 MG PO TABS: 0.5000 mg | ORAL_TABLET | Freq: Every day | ORAL | 1 refills | Status: AC

## 2025-01-20 NOTE — Patient Instructions (Addendum)
 VISIT SUMMARY: During your visit, we discussed your worsening depression and anxiety following a recent breakup, as well as your ongoing peripheral neuropathy. We adjusted your medications and provided additional resources to support your mental health.  YOUR PLAN: MAJOR DEPRESSIVE DISORDER: Your depression has worsened following your recent breakup, and your current medication may not be sufficient. -We increased your dosage of paroxetine . -We prescribed Abilify  to help stabilize your mood and provided a Rexulti coupon. -We provided information on virtual and in-person therapy options.  GENERALIZED ANXIETY DISORDER: Your anxiety has been exacerbated by the breakup, and your current treatment may be inadequate. -We prescribed Abilify  to help relieve your anxiety and provided a Rexulti coupon. -We provided information on virtual and in-person therapy options.  PERIPHERAL NEUROPATHY: Your neuropathy symptoms have worsened, and gabapentin  is no longer effective. -We prescribed Lyrica  50 mg to be taken twice daily.  If you have any problems before your next visit feel free to message me via MyChart (minor issues or questions) or call the office, otherwise you may reach out to schedule an office visit.  Thank you! Saddie Sacks, PA-C    VIRTUAL THERAPY: [facility], [facility],  [facility] MEDICAID THERAPY ONLINE

## 2025-01-21 NOTE — Progress Notes (Signed)
 "  Established Patient Office Visit  Subjective   Patient ID: Stacie Sanders, female    DOB: July 13, 1971  Age: 54 y.o. MRN: [id number]  Chief Complaint  Patient presents with   Medication Management    HPI  Discussed the use of AI scribe software for clinical note transcription with the patient, who gave verbal consent to proceed.  History of Present Illness   [name] is a 54 year old female with a history of depression who presents with worsening depression following a recent breakup.  Depressive symptoms and emotional distress - Worsening depression following breakup with partner of six years - Significant emotional distress, feeling shut out and lacking emotional support - Frequent crying and feeling overwhelmed by emotions - Difficulty managing emotions and inability to regain control over life - Thoughts consumed by ex-partner throughout the day - Desire to focus on other aspects of life but unable due to current mental state - Currently taking paroxetine  (Paxil ) at the lowest dose for depression - Struggling with current emotional state despite medication - Had bad experience with psychiatrist and does not want to see a specialist - Is open to seeing a therapist but transportation is an issue so she prefers virtual options   Sleep disturbance - Taking trazodone  for sleep, which is effective  Menopausal symptoms and emotional lability - Menopause onset at age 21, full menopause by age 19 - Menopausal symptoms such as sweats have subsided - Continued emotional lability, crying easily at minor provocations  Peripheral neuropathy - History of neuropathy with burning sensations in hands - Previously used gabapentin  900 mg daily, poorly tolerated and felt that it was making symptoms worse so she discontinued in  - Pregabalin  (Lyrica ) was effective but cost-prohibitive, however this was several years ago   Financial and medication access concerns - Concerned about  affordability of medications        ROS Per HPI.    Objective:     BP 108/66   Pulse 84   Temp 98 F (36.7 C) (Oral)   Ht 5' 5 (1.651 m)   Wt 149 lb 1.9 oz (67.6 kg)   SpO2 98%   BMI 24.81 kg/m    Physical Exam Constitutional:      General: She is not in acute distress.    Appearance: Normal appearance.     Comments: Anxious appearing and tearful   Cardiovascular:     Rate and Rhythm: Normal rate and regular rhythm.     Heart sounds: Normal heart sounds. No murmur heard.    No friction rub. No gallop.  Pulmonary:     Effort: Pulmonary effort is normal. No respiratory distress.     Breath sounds: Normal breath sounds.  Musculoskeletal:        General: No swelling.  Skin:    General: Skin is warm and dry.  Neurological:     General: No focal deficit present.     Mental Status: She is alert.  Psychiatric:        Mood and Affect: Mood normal.        Behavior: Behavior normal.        Thought Content: Thought content normal.      No results found for any visits on 01/20/25.    The ASCVD Risk score (Arnett DK, et al., 2019) failed to calculate for the following reasons:   Cannot find a previous HDL lab   Cannot find a previous total cholesterol lab   * -  Cholesterol units were assumed    Assessment & Plan:   Neuropathy -     Pregabalin ; Take 1 capsule (50 mg total) by mouth 2 (two) times daily.  Dispense: 60 capsule; Refill: 0  Generalized anxiety disorder Assessment & Plan: GAD-7 score 5. Continue paroxetine  10 mg daily and use hydroxyzine  50 mg as needed for breakthrough anxiety. Follow-up in 6 weeks.    Depression, unspecified depression type Assessment & Plan: PHQ-9 score: 5. Continue Paxil  5 mg daily. score 8 and PHQ 2 score 0. Agreeable to trial of paroxetine  10 mg daily. She does not want to see psychiatry as she had a bad experience in the past. She states she was misdiagnosed with bipolar 1 and felt that the provider she saw was always  wanting to increase medication unnecessarily.  Given recent exacerbation in symptoms, will augment with Abilify  5 mg daily. Pt has been on Seroquel  before and did not have a good experience. I have also sent in Rexulti  but advised the patient to fill whichever one is most affordable for her at the pharmacy.  Resources provided for online virtual therapy Follow up in 6 weeks to assess efficacy  Orders: -     ARIPiprazole ; Take 1 tablet (2 mg total) by mouth daily. Patient wants to fill whichever medication is more affordable (Seroquel  vs Abilify )  Dispense: 30 tablet; Refill: 2 -     Brexpiprazole ; Take 2 tablets (0.5 mg total) by mouth daily.  Dispense: 30 tablet; Refill: 1  Neuralgia and neuritis, unspecified Assessment & Plan: Worsening symptoms, gabapentin  no longer effective (was previously on 900 mg TID)  - Prescribed Lyrica  50 mg twice daily.  - Follow up in 6 weeks to see if dose needs to be increased    Other orders -     Propranolol  HCl; Take 1 tablet (20 mg total) by mouth 2 (two) times daily.  Dispense: 60 tablet; Refill: 1     Return in about 6 weeks (around 03/03/2025).    Stacie JULIANNA Sacks, PA-C "

## 2025-01-21 NOTE — Assessment & Plan Note (Signed)
 GAD-7 score 5. Continue paroxetine  10 mg daily and use hydroxyzine  50 mg as needed for breakthrough anxiety. Follow-up in 6 weeks.

## 2025-01-21 NOTE — Assessment & Plan Note (Addendum)
 Worsening symptoms, gabapentin  no longer effective (was previously on 900 mg TID)  - Prescribed Lyrica  50 mg twice daily.  - Follow up in 6 weeks to see if dose needs to be increased

## 2025-01-21 NOTE — Assessment & Plan Note (Signed)
 PHQ-9 score: 5. Continue Paxil  5 mg daily. score 8 and PHQ 2 score 0. Agreeable to trial of paroxetine  10 mg daily. She does not want to see psychiatry as she had a bad experience in the past. She states she was misdiagnosed with bipolar 1 and felt that the provider she saw was always wanting to increase medication unnecessarily.  Given recent exacerbation in symptoms, will augment with Abilify  5 mg daily. Pt has been on Seroquel  before and did not have a good experience. I have also sent in Rexulti  but advised the patient to fill whichever one is most affordable for her at the pharmacy.  Resources provided for online virtual therapy Follow up in 6 weeks to assess efficacy

## 2025-01-23 ENCOUNTER — Other Ambulatory Visit (HOSPITAL_COMMUNITY): Payer: Self-pay

## 2025-01-23 ENCOUNTER — Telehealth: Payer: Self-pay

## 2025-01-23 NOTE — Telephone Encounter (Signed)
 Pharmacy Patient Advocate Encounter   Received notification from [facility] KEY that prior authorization for brexpiprazole  (REXULTI ) 0.25 MG TABS tablet [[id number]]  is required/requested.   Insurance verification completed.   The patient is insured through [employer].   Per test claim: PA required; PA submitted to above mentioned insurance via Latent Key/confirmation #/EOC [id number] Status is pending

## 2025-01-23 NOTE — Telephone Encounter (Signed)
 Pharmacy Patient Advocate Encounter   Received notification from [facility] that prior authorization for ARIPiprazole  (ABILIFY ) 2 MG tablet is required/requested.   Insurance verification completed.   The patient is insured through [employer].   Per test claim: PA required; PA submitted to above mentioned insurance via Latent Key/confirmation #/EOC [id number] Status is pending

## 2025-01-24 ENCOUNTER — Other Ambulatory Visit: Payer: Self-pay

## 2025-01-24 ENCOUNTER — Ambulatory Visit: Payer: Self-pay

## 2025-01-24 DIAGNOSIS — U071 COVID-19: Secondary | ICD-10-CM

## 2025-01-24 MED ORDER — NIRMATRELVIR/RITONAVIR (PAXLOVID)TABLET: 3.0000 | ORAL_TABLET | Freq: Two times a day (BID) | ORAL | 0 refills | Status: AC

## 2025-01-24 NOTE — Telephone Encounter (Signed)
 FYI Only or Action Required?: Action required by provider: request for appointment, clinical question for provider, and update on patient condition.  Patient was last seen in primary care on 01/20/2025 by Gayle Saddie FALCON, PA-C.  Called Nurse Triage reporting Covid Positive.  Symptoms began yesterday.  Interventions attempted: Rest, hydration, or home remedies.  Symptoms are: gradually worsening.  Triage Disposition: Go to ED Now (or PCP Triage)  Patient/caregiver understands and will follow disposition?:        Reason for Disposition  Chest pain or pressure  (Exception: MILD central chest pain, present only when coughing.)  Answer Assessment - Initial Assessment Questions This RN recommended pt be examined in hospital, pt refusing. Advised pt call 911 or get to hospital asap if any new or worsening symptoms. Sending message to PCP office for call back to pt with further recommendations. Alerted [facility] to ED refusal.     1. SYMPTOMS: What is your main symptom or concern? (e.g., cough, fever, shortness of breath, muscle aches)     Body aches Low grade fever Sore throat A little SOB Can't taste anything Mouth is so dry, lips feel funny, feel bigger, been breathing through mouth a lot, not overtly swollen Nose is real dry too, runs really bad Chest pain when sleeping couple times woke up hurting there, just when sleeping - enough to wake her Dry cough Headache  2. ONSET: When did the symptoms start?      Yesterday  3. COUGH: Do you have a cough? If Yes, ask: How bad is the cough?       Just started today, really dry, pt having coughing fit on phone  4. FEVER: Do you have a fever? If Yes, ask: What is your temperature, how was it measured, and when did it start?     Low grade  5. BREATHING DIFFICULTY: Are you having any difficulty breathing? (e.g., normal; shortness of breath, wheezing, unable to speak)      Some  8. COVID-19 DIAGNOSIS: How do you know that  you have COVID? (e.g., positive lab test or self-test, diagnosed by doctor or NP/PA, symptoms after exposure).     Home test  Denies: Struggling to breathe Confusion/slurred speech Cold/clammy skin, heart racing Constant or current chest pain Stiff neck  Protocols used: COVID-19 - Diagnosed or Suspected-A-AH

## 2025-01-25 ENCOUNTER — Other Ambulatory Visit: Payer: Self-pay

## 2025-01-25 ENCOUNTER — Telehealth: Payer: Self-pay

## 2025-01-25 DIAGNOSIS — U071 COVID-19: Secondary | ICD-10-CM

## 2025-01-25 MED ORDER — PROMETHAZINE-DM 6.25-15 MG/5ML PO SYRP: 5.0000 mL | ORAL_SOLUTION | Freq: Two times a day (BID) | ORAL | 0 refills | Status: AC | PRN

## 2025-01-25 MED ORDER — BENZONATATE 200 MG PO CAPS: 200.0000 mg | ORAL_CAPSULE | Freq: Two times a day (BID) | ORAL | 0 refills | Status: AC | PRN

## 2025-01-25 MED ORDER — HYDROCODONE BIT-HOMATROP MBR 5-1.5 MG/5ML PO SOLN: 5.0000 mL | Freq: Three times a day (TID) | ORAL | 0 refills | Status: AC | PRN

## 2025-01-25 NOTE — Telephone Encounter (Signed)
 Called and LVM; cough syrup has been sent in to pharmacy on file.

## 2025-01-25 NOTE — Telephone Encounter (Signed)
 Yes, I sent in Paxlvoid for her yesterday. Agree with nurse triage if her breathing worsens or she continues to have coughing fits, she needs to go to the ED. I will send in a cough syrup for her too. Will you let her know I have sent this in today?

## 2025-01-25 NOTE — Telephone Encounter (Signed)
 Called and spoke with patient; related new alternative medication.

## 2025-01-25 NOTE — Telephone Encounter (Signed)
 I sent in promethazine  DM as an alternative

## 2025-01-26 ENCOUNTER — Other Ambulatory Visit (HOSPITAL_COMMUNITY): Payer: Self-pay

## 2025-01-26 ENCOUNTER — Telehealth: Payer: Self-pay

## 2025-01-26 NOTE — Telephone Encounter (Signed)
 LVM for pt to call office to inform her of the approval for the below medication.

## 2025-01-26 NOTE — Progress Notes (Signed)
 Complex Care Management Note Care Guide Note  01/26/2025 Name: Stacie Sanders MRN: [id number] DOB: [birth year]   Complex Care Management Outreach Attempts: An unsuccessful telephone outreach was attempted today to offer the patient information about available complex care management services.  Follow Up Plan:  Additional outreach attempts will be made to offer the patient complex care management information and services.   Encounter Outcome:  No Answer    Jon Colt [facility]  [facility] Guide, Phone: [phone] Fax: [phone] Website: [email or web address]

## 2025-01-26 NOTE — Telephone Encounter (Signed)
 Pharmacy Patient Advocate Encounter  Received notification from [employer] that Prior Authorization for ABILIFY ) 2 MG tablet has been APPROVED from 01/24/2025 to 01/24/2026. Unable to obtain price due to refill too soon rejection, last fill date 01/25/2025 next available fill date02/20/2206   PA #/Case ID/Reference #: PA Case: [id number], Status: Approved, Coverage Starts on: 01/24/2025 12:00:00 AM, Coverage Ends on: 01/24/2026 12:00:00 AM. Authorization Expiration01/27/2027

## 2025-01-26 NOTE — Telephone Encounter (Signed)
 LVM for pt to call office to inform her of the approval of below medication.

## 2025-01-26 NOTE — Telephone Encounter (Signed)
 Pharmacy Patient Advocate Encounter  Received notification from [employer] that Prior Authorization for Rexulti  0.25MG  tablets   has been APPROVED PA Case: [id number], Status: Approved, Coverage Starts on: 01/24/2025 12:00:00 AM, Coverage Ends on: 01/24/2026 12:00:00 AM. Authorization Expiration01/27/2027   PA #/Case ID/Reference #:  [id number]

## 2025-01-30 ENCOUNTER — Other Ambulatory Visit (HOSPITAL_COMMUNITY): Payer: Self-pay

## 2025-01-30 ENCOUNTER — Telehealth: Payer: Self-pay

## 2025-02-01 ENCOUNTER — Telehealth: Payer: Self-pay

## 2025-02-01 NOTE — Progress Notes (Signed)
 Complex Care Management Note Care Guide Note  02/01/2025 Name: Stacie Sanders MRN: [id number] DOB: [birth year]   Complex Care Management Outreach Attempts: A third unsuccessful outreach was attempted today to offer the patient with information about available complex care management services.  Follow Up Plan:  No further outreach attempts will be made at this time. We have been unable to contact the patient to offer or enroll patient in complex care management services.  Encounter Outcome:  No Answer left a message    Jon Colt [facility]  [facility] Guide, Phone: [phone] Fax: [phone] Website: [email or web address]

## 2025-02-02 ENCOUNTER — Other Ambulatory Visit: Payer: Self-pay

## 2025-02-02 DIAGNOSIS — R7989 Other specified abnormal findings of blood chemistry: Secondary | ICD-10-CM

## 2025-02-02 DIAGNOSIS — Z130 Encounter for screening for diseases of the blood and blood-forming organs and certain disorders involving the immune mechanism: Secondary | ICD-10-CM

## 2025-02-03 ENCOUNTER — Other Ambulatory Visit

## 2025-03-03 ENCOUNTER — Ambulatory Visit
